# Patient Record
Sex: Female | Born: 1970 | Race: White | Hispanic: No | Marital: Married | Smoking: Never smoker
Health system: Southern US, Community
[De-identification: ages and names within clinical notes are randomized; demographics above are authoritative.]

## PROBLEM LIST (undated history)

## (undated) DIAGNOSIS — Z803 Family history of malignant neoplasm of breast: Secondary | ICD-10-CM

## (undated) DIAGNOSIS — I10 Essential (primary) hypertension: Secondary | ICD-10-CM

## (undated) DIAGNOSIS — Z8481 Family history of carrier of genetic disease: Secondary | ICD-10-CM

## (undated) DIAGNOSIS — S8290XA Unspecified fracture of unspecified lower leg, initial encounter for closed fracture: Secondary | ICD-10-CM

## (undated) DIAGNOSIS — E119 Type 2 diabetes mellitus without complications: Secondary | ICD-10-CM

## (undated) DIAGNOSIS — Z85828 Personal history of other malignant neoplasm of skin: Secondary | ICD-10-CM

## (undated) DIAGNOSIS — Z800 Family history of malignant neoplasm of digestive organs: Secondary | ICD-10-CM

## (undated) DIAGNOSIS — E669 Obesity, unspecified: Secondary | ICD-10-CM

## (undated) DIAGNOSIS — R000 Tachycardia, unspecified: Secondary | ICD-10-CM

## (undated) DIAGNOSIS — K219 Gastro-esophageal reflux disease without esophagitis: Secondary | ICD-10-CM

## (undated) DIAGNOSIS — C801 Malignant (primary) neoplasm, unspecified: Secondary | ICD-10-CM

## (undated) DIAGNOSIS — K5790 Diverticulosis of intestine, part unspecified, without perforation or abscess without bleeding: Secondary | ICD-10-CM

## (undated) DIAGNOSIS — R51 Headache: Secondary | ICD-10-CM

## (undated) DIAGNOSIS — R569 Unspecified convulsions: Secondary | ICD-10-CM

## (undated) DIAGNOSIS — D649 Anemia, unspecified: Secondary | ICD-10-CM

## (undated) DIAGNOSIS — Z808 Family history of malignant neoplasm of other organs or systems: Secondary | ICD-10-CM

## (undated) DIAGNOSIS — Z8742 Personal history of other diseases of the female genital tract: Secondary | ICD-10-CM

## (undated) DIAGNOSIS — D126 Benign neoplasm of colon, unspecified: Secondary | ICD-10-CM

## (undated) DIAGNOSIS — Z9889 Other specified postprocedural states: Secondary | ICD-10-CM

## (undated) DIAGNOSIS — E785 Hyperlipidemia, unspecified: Secondary | ICD-10-CM

## (undated) DIAGNOSIS — K648 Other hemorrhoids: Secondary | ICD-10-CM

## (undated) DIAGNOSIS — Z8632 Personal history of gestational diabetes: Secondary | ICD-10-CM

## (undated) HISTORY — DX: Other hemorrhoids: K64.8

## (undated) HISTORY — DX: Obesity, unspecified: E66.9

## (undated) HISTORY — DX: Gastro-esophageal reflux disease without esophagitis: K21.9

## (undated) HISTORY — PX: BASAL CELL CARCINOMA EXCISION: SHX1214

## (undated) HISTORY — DX: Personal history of gestational diabetes: Z86.32

## (undated) HISTORY — DX: Hyperlipidemia, unspecified: E78.5

## (undated) HISTORY — DX: Other specified postprocedural states: Z85.828

## (undated) HISTORY — DX: Family history of malignant neoplasm of breast: Z80.3

## (undated) HISTORY — DX: Family history of carrier of genetic disease: Z84.81

## (undated) HISTORY — DX: Family history of malignant neoplasm of digestive organs: Z80.0

## (undated) HISTORY — DX: Tachycardia, unspecified: R00.0

## (undated) HISTORY — DX: Personal history of other malignant neoplasm of skin: Z85.828

## (undated) HISTORY — DX: Diverticulosis of intestine, part unspecified, without perforation or abscess without bleeding: K57.90

## (undated) HISTORY — DX: Benign neoplasm of colon, unspecified: D12.6

## (undated) HISTORY — DX: Essential (primary) hypertension: I10

## (undated) HISTORY — DX: Headache: R51

## (undated) HISTORY — DX: Unspecified fracture of unspecified lower leg, initial encounter for closed fracture: S82.90XA

## (undated) HISTORY — DX: Family history of malignant neoplasm of other organs or systems: Z80.8

## (undated) HISTORY — DX: Anemia, unspecified: D64.9

## (undated) HISTORY — DX: Type 2 diabetes mellitus without complications: E11.9

## (undated) HISTORY — DX: Unspecified convulsions: R56.9

## (undated) HISTORY — DX: Other specified postprocedural states: Z98.890

## (undated) HISTORY — DX: Personal history of other diseases of the female genital tract: Z87.42

## (undated) HISTORY — PX: TONSILLECTOMY: SUR1361

---

## 1998-10-27 ENCOUNTER — Other Ambulatory Visit: Admission: RE | Admit: 1998-10-27 | Discharge: 1998-10-27 | Payer: Self-pay | Admitting: Obstetrics and Gynecology

## 1999-10-28 ENCOUNTER — Other Ambulatory Visit: Admission: RE | Admit: 1999-10-28 | Discharge: 1999-10-28 | Payer: Self-pay | Admitting: *Deleted

## 2000-11-23 ENCOUNTER — Other Ambulatory Visit: Admission: RE | Admit: 2000-11-23 | Discharge: 2000-11-23 | Payer: Self-pay | Admitting: *Deleted

## 2001-09-13 ENCOUNTER — Other Ambulatory Visit: Admission: RE | Admit: 2001-09-13 | Discharge: 2001-09-13 | Payer: Self-pay | Admitting: *Deleted

## 2002-04-09 ENCOUNTER — Other Ambulatory Visit: Admission: RE | Admit: 2002-04-09 | Discharge: 2002-04-09 | Payer: Self-pay | Admitting: *Deleted

## 2002-07-09 ENCOUNTER — Encounter: Admission: RE | Admit: 2002-07-09 | Discharge: 2002-10-07 | Payer: Self-pay | Admitting: *Deleted

## 2002-10-23 ENCOUNTER — Inpatient Hospital Stay (HOSPITAL_COMMUNITY): Admission: AD | Admit: 2002-10-23 | Discharge: 2002-10-25 | Payer: Self-pay | Admitting: *Deleted

## 2002-11-28 ENCOUNTER — Other Ambulatory Visit: Admission: RE | Admit: 2002-11-28 | Discharge: 2002-11-28 | Payer: Self-pay | Admitting: *Deleted

## 2004-11-17 ENCOUNTER — Ambulatory Visit: Payer: Self-pay | Admitting: Internal Medicine

## 2005-01-03 ENCOUNTER — Ambulatory Visit: Payer: Self-pay | Admitting: Internal Medicine

## 2005-01-10 ENCOUNTER — Other Ambulatory Visit: Admission: RE | Admit: 2005-01-10 | Discharge: 2005-01-10 | Payer: Self-pay | Admitting: Internal Medicine

## 2005-01-10 ENCOUNTER — Ambulatory Visit: Payer: Self-pay | Admitting: Internal Medicine

## 2005-03-11 ENCOUNTER — Encounter: Admission: RE | Admit: 2005-03-11 | Discharge: 2005-03-11 | Payer: Self-pay | Admitting: Internal Medicine

## 2005-10-19 ENCOUNTER — Ambulatory Visit: Payer: Self-pay | Admitting: Internal Medicine

## 2006-03-17 ENCOUNTER — Encounter: Admission: RE | Admit: 2006-03-17 | Discharge: 2006-03-17 | Payer: Self-pay | Admitting: Internal Medicine

## 2006-07-10 ENCOUNTER — Ambulatory Visit: Payer: Self-pay | Admitting: Internal Medicine

## 2006-09-29 ENCOUNTER — Ambulatory Visit: Payer: Self-pay | Admitting: Internal Medicine

## 2006-09-29 LAB — CONVERTED CEMR LAB
Albumin: 3.8 g/dL (ref 3.5–5.2)
Alkaline Phosphatase: 56 units/L (ref 39–117)
BUN: 11 mg/dL (ref 6–23)
Basophils Absolute: 0 10*3/uL (ref 0.0–0.1)
CO2: 29 meq/L (ref 19–32)
Calcium: 9.1 mg/dL (ref 8.4–10.5)
Chol/HDL Ratio, serum: 5.4
Creatinine, Ser: 0.6 mg/dL (ref 0.4–1.2)
Glomerular Filtration Rate, Af Am: 145 mL/min/{1.73_m2}
Glucose, Bld: 89 mg/dL (ref 70–99)
HDL: 38.6 mg/dL — ABNORMAL LOW (ref >39.0)
Hemoglobin: 13.5 g/dL (ref 12.0–15.0)
Lymphocytes Relative: 29.1 % (ref 12.0–46.0)
MCHC: 34.1 g/dL (ref 30.0–36.0)
Monocytes Relative: 6 % (ref 3.0–11.0)
Neutro Abs: 3.5 10*3/uL (ref 1.4–7.7)
Neutrophils Relative %: 62.9 % (ref 43.0–77.0)
Platelets: 247 10*3/uL (ref 150–400)
Potassium: 4.1 meq/L (ref 3.5–5.1)
TSH: 1.55 microintl units/mL (ref 0.35–5.50)
Total Bilirubin: 0.9 mg/dL (ref 0.3–1.2)
Total Protein: 6.6 g/dL (ref 6.0–8.3)
Triglyceride fasting, serum: 60 mg/dL (ref 0–149)

## 2006-10-06 ENCOUNTER — Ambulatory Visit: Payer: Self-pay | Admitting: Internal Medicine

## 2006-10-06 ENCOUNTER — Other Ambulatory Visit: Admission: RE | Admit: 2006-10-06 | Discharge: 2006-10-06 | Payer: Self-pay | Admitting: Internal Medicine

## 2006-10-06 ENCOUNTER — Encounter: Payer: Self-pay | Admitting: Internal Medicine

## 2006-10-27 ENCOUNTER — Ambulatory Visit: Payer: Self-pay | Admitting: Internal Medicine

## 2006-10-27 ENCOUNTER — Ambulatory Visit (HOSPITAL_COMMUNITY): Admission: RE | Admit: 2006-10-27 | Discharge: 2006-10-27 | Payer: Self-pay | Admitting: Internal Medicine

## 2006-11-16 ENCOUNTER — Ambulatory Visit: Payer: Self-pay | Admitting: Internal Medicine

## 2006-11-17 ENCOUNTER — Ambulatory Visit (HOSPITAL_COMMUNITY): Admission: RE | Admit: 2006-11-17 | Discharge: 2006-11-17 | Payer: Self-pay | Admitting: Internal Medicine

## 2007-03-19 ENCOUNTER — Encounter: Admission: RE | Admit: 2007-03-19 | Discharge: 2007-03-19 | Payer: Self-pay | Admitting: Internal Medicine

## 2007-10-02 ENCOUNTER — Ambulatory Visit: Payer: Self-pay | Admitting: Internal Medicine

## 2007-10-02 LAB — CONVERTED CEMR LAB
AST: 13 units/L (ref 0–37)
Albumin: 3.9 g/dL (ref 3.5–5.2)
Alkaline Phosphatase: 68 units/L (ref 39–117)
Basophils Absolute: 0 10*3/uL (ref 0.0–0.1)
Basophils Relative: 0.4 % (ref 0.0–1.0)
CO2: 29 meq/L (ref 19–32)
Chloride: 108 meq/L (ref 96–112)
Creatinine, Ser: 0.6 mg/dL (ref 0.4–1.2)
Direct LDL: 172.4 mg/dL
HCT: 38.6 % (ref 36.0–46.0)
Hemoglobin: 13.5 g/dL (ref 12.0–15.0)
MCHC: 34.9 g/dL (ref 30.0–36.0)
Monocytes Absolute: 0.4 10*3/uL (ref 0.2–0.7)
Neutrophils Relative %: 66.1 % (ref 43.0–77.0)
RBC: 4.56 M/uL (ref 3.87–5.11)
RDW: 12 % (ref 11.5–14.6)
Sodium: 142 meq/L (ref 135–145)
Total Bilirubin: 0.8 mg/dL (ref 0.3–1.2)
Total CHOL/HDL Ratio: 5.1
Total Protein: 6.6 g/dL (ref 6.0–8.3)
Triglycerides: 72 mg/dL (ref 0–149)
VLDL: 14 mg/dL (ref 0–40)
WBC: 6.9 10*3/uL (ref 4.5–10.5)

## 2007-10-04 LAB — CONVERTED CEMR LAB
Glucose, Urine, Semiquant: NEGATIVE
Nitrite: NEGATIVE
Urobilinogen, UA: 2
pH: 5

## 2007-10-09 ENCOUNTER — Other Ambulatory Visit: Admission: RE | Admit: 2007-10-09 | Discharge: 2007-10-09 | Payer: Self-pay | Admitting: Internal Medicine

## 2007-10-09 ENCOUNTER — Encounter: Payer: Self-pay | Admitting: Internal Medicine

## 2007-10-09 ENCOUNTER — Ambulatory Visit: Payer: Self-pay | Admitting: Internal Medicine

## 2007-10-09 DIAGNOSIS — E785 Hyperlipidemia, unspecified: Secondary | ICD-10-CM | POA: Insufficient documentation

## 2007-10-09 DIAGNOSIS — L732 Hidradenitis suppurativa: Secondary | ICD-10-CM | POA: Insufficient documentation

## 2007-10-09 DIAGNOSIS — K209 Esophagitis, unspecified without bleeding: Secondary | ICD-10-CM | POA: Insufficient documentation

## 2007-10-09 DIAGNOSIS — R319 Hematuria, unspecified: Secondary | ICD-10-CM | POA: Insufficient documentation

## 2007-10-09 LAB — HM PAP SMEAR

## 2007-10-17 ENCOUNTER — Encounter: Payer: Self-pay | Admitting: Internal Medicine

## 2007-11-20 ENCOUNTER — Ambulatory Visit: Payer: Self-pay | Admitting: Internal Medicine

## 2007-11-20 LAB — CONVERTED CEMR LAB
Blood in Urine, dipstick: NEGATIVE
Ketones, urine, test strip: NEGATIVE
Nitrite: NEGATIVE
Urobilinogen, UA: 0.2
WBC Urine, dipstick: NEGATIVE

## 2007-11-22 LAB — CONVERTED CEMR LAB
Bilirubin, Direct: 0.2 mg/dL (ref 0.0–0.3)
HDL: 45.9 mg/dL (ref >39.0)
LDL Cholesterol: 101 mg/dL — ABNORMAL HIGH (ref 0–99)
Triglycerides: 59 mg/dL (ref 0–149)
VLDL: 12 mg/dL (ref 0–40)

## 2007-11-27 ENCOUNTER — Ambulatory Visit: Payer: Self-pay | Admitting: Internal Medicine

## 2007-11-27 LAB — CONVERTED CEMR LAB
Cholesterol, target level: 200 mg/dL
HDL goal, serum: 40 mg/dL
LDL Goal: 160 mg/dL

## 2007-12-13 HISTORY — PX: BREAST BIOPSY: SHX20

## 2007-12-25 ENCOUNTER — Ambulatory Visit: Payer: Self-pay | Admitting: Licensed Clinical Social Worker

## 2008-01-03 ENCOUNTER — Ambulatory Visit: Payer: Self-pay | Admitting: Licensed Clinical Social Worker

## 2008-02-28 ENCOUNTER — Ambulatory Visit: Payer: Self-pay | Admitting: Licensed Clinical Social Worker

## 2008-03-21 ENCOUNTER — Encounter: Admission: RE | Admit: 2008-03-21 | Discharge: 2008-03-21 | Payer: Self-pay | Admitting: Internal Medicine

## 2008-04-02 ENCOUNTER — Encounter: Admission: RE | Admit: 2008-04-02 | Discharge: 2008-04-02 | Payer: Self-pay | Admitting: Internal Medicine

## 2008-04-02 ENCOUNTER — Encounter: Payer: Self-pay | Admitting: Internal Medicine

## 2008-05-27 ENCOUNTER — Ambulatory Visit: Payer: Self-pay | Admitting: Internal Medicine

## 2008-05-27 LAB — CONVERTED CEMR LAB
ALT: 16 units/L (ref 0–35)
Cholesterol: 165 mg/dL (ref 0–200)
LDL Cholesterol: 107 mg/dL — ABNORMAL HIGH (ref 0–99)

## 2008-06-03 ENCOUNTER — Ambulatory Visit: Payer: Self-pay | Admitting: Internal Medicine

## 2008-06-03 DIAGNOSIS — M25569 Pain in unspecified knee: Secondary | ICD-10-CM | POA: Insufficient documentation

## 2008-06-03 LAB — CONVERTED CEMR LAB: Blood Glucose, Fingerstick: 102

## 2008-10-16 ENCOUNTER — Ambulatory Visit: Payer: Self-pay | Admitting: Internal Medicine

## 2008-10-22 ENCOUNTER — Ambulatory Visit: Payer: Self-pay | Admitting: Internal Medicine

## 2008-10-31 ENCOUNTER — Telehealth: Payer: Self-pay | Admitting: Family Medicine

## 2008-11-11 ENCOUNTER — Ambulatory Visit: Payer: Self-pay | Admitting: Internal Medicine

## 2008-11-11 LAB — CONVERTED CEMR LAB
Albumin: 3.8 g/dL (ref 3.5–5.2)
Alkaline Phosphatase: 58 units/L (ref 39–117)
BUN: 15 mg/dL (ref 6–23)
Cholesterol: 180 mg/dL (ref 0–200)
Creatinine, Ser: 0.6 mg/dL (ref 0.4–1.2)
Eosinophils Absolute: 0.1 10*3/uL (ref 0.0–0.7)
Eosinophils Relative: 1.8 % (ref 0.0–5.0)
GFR calc Af Amer: 144 mL/min
GFR calc non Af Amer: 119 mL/min
HCT: 36.6 % (ref 36.0–46.0)
HDL: 50.2 mg/dL (ref >39.0)
MCV: 80.3 fL (ref 78.0–100.0)
Monocytes Absolute: 0.5 10*3/uL (ref 0.1–1.0)
Platelets: 220 10*3/uL (ref 150–400)
Potassium: 3.8 meq/L (ref 3.5–5.1)
TSH: 1.8 microintl units/mL (ref 0.35–5.50)
Triglycerides: 78 mg/dL (ref 0–149)
WBC: 7 10*3/uL (ref 4.5–10.5)

## 2008-11-18 ENCOUNTER — Ambulatory Visit: Payer: Self-pay | Admitting: Internal Medicine

## 2008-12-10 ENCOUNTER — Ambulatory Visit: Payer: Self-pay | Admitting: Internal Medicine

## 2009-04-11 LAB — CONVERTED CEMR LAB: Pap Smear: NORMAL

## 2009-06-02 ENCOUNTER — Ambulatory Visit: Payer: Self-pay | Admitting: Internal Medicine

## 2009-06-02 DIAGNOSIS — I10 Essential (primary) hypertension: Secondary | ICD-10-CM | POA: Insufficient documentation

## 2009-06-02 DIAGNOSIS — Z8742 Personal history of other diseases of the female genital tract: Secondary | ICD-10-CM

## 2009-06-02 HISTORY — DX: Personal history of other diseases of the female genital tract: Z87.42

## 2009-06-02 LAB — CONVERTED CEMR LAB: Blood Glucose, Fingerstick: 103

## 2009-07-28 ENCOUNTER — Ambulatory Visit: Payer: Self-pay | Admitting: Internal Medicine

## 2009-07-30 LAB — CONVERTED CEMR LAB
GFR calc non Af Amer: 118.05 mL/min (ref >60)
Potassium: 4.1 meq/L (ref 3.5–5.1)
Sodium: 140 meq/L (ref 135–145)

## 2009-09-17 ENCOUNTER — Ambulatory Visit: Payer: Self-pay | Admitting: Internal Medicine

## 2009-12-21 ENCOUNTER — Ambulatory Visit: Payer: Self-pay | Admitting: Internal Medicine

## 2009-12-21 LAB — CONVERTED CEMR LAB
AST: 16 units/L (ref 0–37)
BUN: 13 mg/dL (ref 6–23)
Basophils Absolute: 0.1 10*3/uL (ref 0.0–0.1)
Calcium: 9.1 mg/dL (ref 8.4–10.5)
Cholesterol: 185 mg/dL (ref 0–200)
Eosinophils Absolute: 0.2 10*3/uL (ref 0.0–0.7)
GFR calc non Af Amer: 117.81 mL/min (ref >60)
Glucose, Bld: 109 mg/dL — ABNORMAL HIGH (ref 70–99)
HDL: 51 mg/dL (ref >39.00)
Ketones, ur: NEGATIVE mg/dL
Leukocytes, UA: NEGATIVE
Lymphocytes Relative: 27.1 % (ref 12.0–46.0)
Lymphs Abs: 1.7 10*3/uL (ref 0.7–4.0)
MCHC: 31.7 g/dL (ref 30.0–36.0)
Monocytes Relative: 6.2 % (ref 3.0–12.0)
Nitrite: NEGATIVE
Platelets: 283 10*3/uL (ref 150.0–400.0)
RDW: 17.6 % — ABNORMAL HIGH (ref 11.5–14.6)
Specific Gravity, Urine: 1.025 (ref 1.000–1.030)
TSH: 1.67 microintl units/mL (ref 0.35–5.50)
Total Bilirubin: 0.7 mg/dL (ref 0.3–1.2)
Triglycerides: 65 mg/dL (ref 0.0–149.0)
VLDL: 13 mg/dL (ref 0.0–40.0)
pH: 5 (ref 5.0–8.0)

## 2010-01-12 ENCOUNTER — Ambulatory Visit: Payer: Self-pay | Admitting: Internal Medicine

## 2010-01-12 DIAGNOSIS — D649 Anemia, unspecified: Secondary | ICD-10-CM | POA: Insufficient documentation

## 2010-03-03 ENCOUNTER — Ambulatory Visit: Payer: Self-pay | Admitting: Internal Medicine

## 2010-03-09 ENCOUNTER — Telehealth: Payer: Self-pay | Admitting: Internal Medicine

## 2010-03-09 LAB — CONVERTED CEMR LAB
ALT: 16 units/L (ref 0–35)
AST: 15 units/L (ref 0–37)
Albumin: 3.7 g/dL (ref 3.5–5.2)
Alkaline Phosphatase: 53 units/L (ref 39–117)
Bilirubin, Direct: 0.1 mg/dL (ref 0.0–0.3)
Cholesterol: 135 mg/dL (ref 0–200)
LDL Cholesterol: 75 mg/dL (ref 0–99)
Total Protein: 6.7 g/dL (ref 6.0–8.3)
Triglycerides: 54 mg/dL (ref 0.0–149.0)

## 2010-04-02 ENCOUNTER — Encounter: Admission: RE | Admit: 2010-04-02 | Discharge: 2010-04-02 | Payer: Self-pay | Admitting: Internal Medicine

## 2010-04-08 ENCOUNTER — Encounter: Admission: RE | Admit: 2010-04-08 | Discharge: 2010-04-08 | Payer: Self-pay | Admitting: Internal Medicine

## 2010-04-08 ENCOUNTER — Encounter: Payer: Self-pay | Admitting: Internal Medicine

## 2010-04-08 LAB — HM MAMMOGRAPHY

## 2010-06-29 ENCOUNTER — Encounter (INDEPENDENT_AMBULATORY_CARE_PROVIDER_SITE_OTHER): Payer: Self-pay | Admitting: Obstetrics & Gynecology

## 2010-06-29 ENCOUNTER — Observation Stay (HOSPITAL_COMMUNITY): Admission: RE | Admit: 2010-06-29 | Discharge: 2010-06-30 | Payer: Self-pay | Admitting: Obstetrics & Gynecology

## 2010-06-29 HISTORY — PX: ABDOMINAL HYSTERECTOMY: SHX81

## 2010-07-04 ENCOUNTER — Inpatient Hospital Stay (HOSPITAL_COMMUNITY): Admission: AD | Admit: 2010-07-04 | Discharge: 2010-07-04 | Payer: Self-pay | Admitting: Obstetrics & Gynecology

## 2010-07-04 DIAGNOSIS — R5082 Postprocedural fever: Secondary | ICD-10-CM

## 2010-07-15 ENCOUNTER — Ambulatory Visit: Payer: Self-pay | Admitting: Internal Medicine

## 2010-07-15 DIAGNOSIS — E669 Obesity, unspecified: Secondary | ICD-10-CM | POA: Insufficient documentation

## 2010-07-15 DIAGNOSIS — R7309 Other abnormal glucose: Secondary | ICD-10-CM | POA: Insufficient documentation

## 2010-07-15 DIAGNOSIS — J309 Allergic rhinitis, unspecified: Secondary | ICD-10-CM | POA: Insufficient documentation

## 2010-09-01 ENCOUNTER — Ambulatory Visit: Payer: Self-pay | Admitting: Family Medicine

## 2010-09-01 DIAGNOSIS — R000 Tachycardia, unspecified: Secondary | ICD-10-CM

## 2010-09-01 DIAGNOSIS — J019 Acute sinusitis, unspecified: Secondary | ICD-10-CM | POA: Insufficient documentation

## 2010-09-01 HISTORY — DX: Tachycardia, unspecified: R00.0

## 2011-01-13 NOTE — Assessment & Plan Note (Signed)
Summary: fup bp//ccm   Vital Signs:  Patient profile:   40 year old female Menstrual status:  hysterectomy Height:      63.5 inches Weight:      212 pounds BMI:     37.10 Pulse rate:   88 / minute BP sitting:   110 / 70  (right arm) Cuff size:   large  Vitals Entered By: Romualdo Bolk, CMA (AAMA) (July 15, 2010 10:29 AM)  Serial Vital Signs/Assessments:  Time      Position  BP       Pulse  Resp  Temp     By                     122/80                         Madelin Headings MD  Comments: right arm large cuff  By: Madelin Headings MD   CC: Follow-up visit on bp, Hypertension Management  Does patient need assistance? Functional Status Shopping LMP - Character: heavy Menarche (age onset years): 10   Menses interval (days): 28 Menstrual flow (days): 5-7 Menstrual Status hysterectomy Last PAP Result normal   History of Present Illness: Nicole Gonzalez  comes in today  for follow up of a number o medical issues . She had her hysterctomy   on July 17th    surgery Da vinci method and went well. Bp was good in hospital and has been good since on med and out of work.     Is post op.    Bp has been good   some coughin at night  and about  taking sinus pill and afeels like drainage  in throat.  antibioitc helped .         Anemia :   9  range .  pre op  no recent check .  Hypertension History:      She complains of headache and syncope, but denies chest pain, palpitations, dyspnea with exertion, orthopnea, PND, peripheral edema, visual symptoms, neurologic problems, and side effects from treatment.  She notes no problems with any antihypertensive medication side effects.  Sinus Ha's at work, dizziness since surgery.        Positive major cardiovascular risk factors include hyperlipidemia and hypertension.  Negative major cardiovascular risk factors include female age less than 91 years old, no history of diabetes, and non-tobacco-user status.        Further assessment for target  organ damage reveals no history of ASHD, stroke/TIA, or peripheral vascular disease.     Preventive Screening-Counseling & Management  Alcohol-Tobacco     Alcohol drinks/day: 0     Smoking Status: never  Caffeine-Diet-Exercise     Caffeine use/day: <1     Does Patient Exercise: no  Current Medications (verified): 1)  Prilosec 20 Mg Cpdr (Omeprazole) .Marland Kitchen.. 1 Once Daily 2)  Lisinopril 10 Mg Tabs (Lisinopril) .Marland Kitchen.. 1 By Mouth Once Daily  For High Blood Pressure 3)  Simvastatin 40 Mg Tabs (Simvastatin) .Marland Kitchen.. 1 By Mouth Once Daily For High Cholesterol 4)  Ibuprofen 200 Mg Tabs (Ibuprofen) 5)  Stool Softner  Allergies (verified): 1)  ! Stadol (Butorphanol Tartrate)  Past History:  Past medical, surgical, family and social histories (including risk factors) reviewed, and no changes noted (except as noted below).  Past Medical History: Reviewed history from 01/12/2010 and no changes required. Hyperlipidemia  pretreatment  ldl 199 hx  gestational dm x2  childbirth x2         LAST Mammogram: 2009 Pap: doing with gyn Td: November 18, 2008 Colonscopy: n/a EKG: November 18, 2008   Consults: None   Past Surgical History: Tonsillectomy Hysterectomy-06/29/2010   ? had abnormal clls on path   fibroids  Past History:  Care Management: Dermatology: [facility] Gynecology: Dr Juliene Pina  Family History: Reviewed history from 12/10/2008 and no changes required. Family History Diabetes 1st degree relative Family History High cholesterol Family History of Melanoma    Father:   HT  , melanoma x 2  Mother:  well Siblings:   younger brother psoriatic arthritis  Older brother overweight.     Social History: Reviewed history from 01/12/2010 and no changes required. 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   no ETS      Review of Systems  The patient denies anorexia, fever, weight loss, weight gain, vision loss,  decreased hearing, prolonged cough, hemoptysis, abdominal pain, difficulty walking, abnormal bleeding, enlarged lymph nodes, and angioedema.    Physical Exam  General:  Well-developed,well-nourished,in no acute distress; alert,appropriate and cooperative throughout examination Head:  normocephalic and atraumatic.   Eyes:  vision grossly intact, pupils equal, and pupils round.   Neck:  No deformities, masses, or tenderness noted. Lungs:  Normal respiratory effort, chest expands symmetrically. Lungs are clear to auscultation, no crackles or wheezes. Heart:  Normal rate and regular rhythm. S1 and S2 normal without gallop, murmur, click, rub or other extra sounds. Pulses:  pulses intact without delay   Extremities:  no clubbing cyanosis or edema  Neurologic:  non focal  Skin:  turgor normal, color normal, no ecchymoses, and no petechiae.   Cervical Nodes:  No lymphadenopathy noted Psych:  Oriented X3, normally interactive, good eye contact, and not anxious appearing.     Impression & Recommendations:  Problem # 1:  HYPERTENSION (ICD-401.9) controlled    Her updated medication list for this problem includes:    Lisinopril 10 Mg Tabs (Lisinopril) .Marland Kitchen... 1 by mouth once daily  for high blood pressure  BP today: 110/70 Prior BP: 110/80 (01/12/2010)  Prior 10 Yr Risk Heart Disease: 1 % (07/28/2009)  Labs Reviewed: K+: 4.6 (12/21/2009) Creat: : 0.6 (12/21/2009)   Chol: 135 (03/03/2010)   HDL: 49.70 (03/03/2010)   LDL: 75 (03/03/2010)   TG: 54.0 (03/03/2010)  Problem # 2:  ANEMIA (ICD-285.9) post op better   to follow up with gyne  irona s needed Orders: Hgb (85018) Fingerstick (16109)  Problem # 3:  HYPERGLYCEMIA (ICD-790.29) up  167   after peanut butter today.    counseled about diet strategies and monitor   will check hg a1c at next check up.  Orders: Glucose, (CBG) (82962) Fingerstick (60454)  Problem # 4:  DIABETES MELLITUS, GESTATIONAL, HX OF (ICD-V13.29)  Orders: Glucose,  (CBG) (09811) Fingerstick (91478)  Problem # 5:  RHINITIS (ICD-477.9) with cough resolved now but can be recurrent .   and will do a trial of flonase if does  Her updated medication list for this problem includes:    Fluticasone Propionate 50 Mcg/act Susp (Fluticasone propionate) .Marland Kitchen... 2 sprays eaach nares q d  Problem # 6:  OBESITY (ICD-278.00) counseled  Ht: 63.5 (07/15/2010)   Wt: 212 (07/15/2010)   BMI: 37.10 (07/15/2010)  Complete Medication List: 1)  Prilosec 20 Mg Cpdr (Omeprazole) .Marland Kitchen.. 1 once daily 2)  Lisinopril  10 Mg Tabs (Lisinopril) .Marland Kitchen.. 1 by mouth once daily  for high blood pressure 3)  Simvastatin 40 Mg Tabs (Simvastatin) .Marland Kitchen.. 1 by mouth once daily for high cholesterol 4)  Ibuprofen 200 Mg Tabs (Ibuprofen) 5)  Stool Softner  6)  Fluticasone Propionate 50 Mcg/act Susp (Fluticasone propionate) .... 2 sprays eaach nares q d  Hypertension Assessment/Plan:      The patient's hypertensive risk group is category B: At least one risk factor (excluding diabetes) with no target organ damage.  Her calculated 10 year risk of coronary heart disease is 1 %.  Today's blood pressure is 110/70.  Her blood pressure goal is < 140/90.  Patient Instructions: 1)  try claritin or zyrtec   for drainage. and then  2)   begin on nasal steroid spray. if  respiratory signs returning.  to see if this helps control the  sinus signs .  3)  Your Hg was 11.6   4)  Your blood sugar is in the prediabetic range  .  5)  will follow . 6)  YOur Blood pressure i s controlled and  continue med.  7)  CPX with labs and hg a1c   for next February  2012 or as needed.  Prescriptions: FLUTICASONE PROPIONATE 50 MCG/ACT SUSP (FLUTICASONE PROPIONATE) 2 sprays eaach nares q d  #1 x 3   Entered and Authorized by:   Madelin Headings MD   Signed by:   Madelin Headings MD on 07/15/2010   Method used:   Print then Give to Patient   RxID:   [id number]   Laboratory Results   Blood Tests     Glucose (random): 169  mg/dL   (Normal Range: 14-782)   CBC   HGB:  11.6 g/dL   (Normal Range: 95.6-21.3 in Males, 12.0-15.0 in Females) Comments: Rita Ohara  July 15, 2010 11:13 AM

## 2011-01-13 NOTE — Assessment & Plan Note (Signed)
Summary: cpx/per Shannon/cjr   Vital Signs:  Patient profile:   40 year old female Menstrual status:  regular LMP:     01/12/2010 Height:      63.5 inches Weight:      212 pounds Pulse rate:   66 / minute BP sitting:   110 / 80  (left arm) Cuff size:   large  Vitals Entered By: Romualdo Bolk, CMA (AAMA) (January 12, 2010 11:31 AM)  Serial Vital Signs/Assessments:  Time      Position  BP       Pulse  Resp  Temp     By                     120/70                         Madelin Headings MD  Comments: large cuff sitting  Pt brought in her bp wrist cuff but it kept coming up with error. Romualdo Bolk, CMA (AAMA)  January 12, 2010 11:39 AM  By: Madelin Headings MD   CC: CPX without pap- Pt has a gyn who does paps LMP (date): 01/12/2010 LMP - Character: heavy Menarche (age onset years): 10   Menses interval (days): 28 Menstrual flow (days): 5-7 Enter LMP: 01/12/2010 Last PAP Result normal   History of Present Illness: Nicole Gonzalez  comesin today for   for preventive visit . Since last visit  here  there have been no major changes in health status  .  She  has begun to reemphasize some exercise. feeling ok. SOme  activity.    HT: no se of meds  wrist cuff reading high but doesnt think its correct  LIPIDs:  no se  of meds  GI. when ran out of  prilosec  tried .  prevacid no help.  and had more symptom  Periods : very heavy . followed by gyne. considering hormonal therapy.   Preventive Care Screening  Pap Smear:    Date:  04/11/2009    Results:  normal   Prior Values:    Last Tetanus Booster:  Tdap (11/18/2008)   Preventive Screening-Counseling & Management  Alcohol-Tobacco     Alcohol drinks/day: 0     Smoking Status: never  Caffeine-Diet-Exercise     Caffeine use/day: <1     Does Patient Exercise: no  Hep-HIV-STD-Contraception     Dental Visit-last 6 months yes     Sun Exposure-Excessive: yes  Safety-Violence-Falls     Seat Belt Use: yes  Firearms in the Home: firearms in the home     Firearm Counseling: not indicated; uses recommended firearm safety measures     Smoke Detectors: yes      Blood Transfusions:  no.        Travel History:  no risk travell.    Current Medications (verified): 1)  Simvastatin 20 Mg  Tabs (Simvastatin) .Marland Kitchen.. 1 By Mouth Once Daily 2)  Prilosec 20 Mg Cpdr (Omeprazole) .Marland Kitchen.. 1 Once Daily 3)  Lisinopril 10 Mg Tabs (Lisinopril) .Marland Kitchen.. 1 By Mouth Once Daily  For High Blood Pressure  Allergies (verified): No Known Drug Allergies  Past History:  Past medical, surgical, family and social histories (including risk factors) reviewed, and no changes noted (except as noted below).  Past Medical History: Hyperlipidemia  pretreatment  ldl 199 hx gestational dm x2  childbirth x2  LAST Mammogram: 2009 Pap: doing with gyn Td: November 18, 2008 Colonscopy: n/a EKG: November 18, 2008   Consults: None   Past Surgical History: Reviewed history from 11/18/2008 and no changes required. Tonsillectomy  Past History:  Care Management: Dermatology: [facility] Gynecology: Dr Earlene Plater  Family History: Reviewed history from 12/10/2008 and no changes required. Family History Diabetes 1st degree relative Family History High cholesterol Family History of Melanoma    Father:   HT  , melanoma x 2  Mother:  well Siblings:   younger brother psoriatic arthritis  Older brother overweight.     Social History: Reviewed history from 12/10/2008 and no changes required. 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   no ETS    Dental Care w/in 6 mos.:  yes Sun Exposure-Excessive:  yes Seat Belt Use:  yes Blood Transfusions:  no  Review of Systems  The patient denies anorexia, fever, weight loss, weight gain, vision loss, decreased hearing, hoarseness, chest pain, syncope, dyspnea on exertion, peripheral edema, headaches, hemoptysis,  abdominal pain, melena, hematochezia, severe indigestion/heartburn, hematuria, muscle weakness, difficulty walking, unusual weight change, abnormal bleeding, enlarged lymph nodes, angioedema, and breast masses.         cough   at night from  resp infection.   Physical Exam General Appearance: well developed, well nourished, no acute distress Eyes: conjunctiva and lids normal, PERRLA, EOMI, WNL Ears, Nose, Mouth, Throat: TM clear, nares clear, oral exam WNL Neck: supple, no lymphadenopathy, no thyromegaly, no JVD Respiratory: clear to auscultation and percussion, respiratory effort normal Cardiovascular: regular rate and rhythm, S1-S2, no murmur, rub or gallop, no bruits, peripheral pulses normal and symmetric, no cyanosis, clubbing, edema   fewvaricosities Chest: no scars, masses, tenderness; no asymmetry, skin changes, nipple discharge   Gastrointestinal: soft, non-tender; no hepatosplenomegaly, masses; active bowel sounds all quadrants,  Genitourinary: per gyne Lymphatic: no cervical, axillary or inguinal adenopathy Musculoskeletal: gait normal, muscle tone and strength WNL, no joint swelling, effusions, discoloration, crepitus  Skin: clear, good turgor, color WNL, no rashes, lesions, or ulcerations Neurologic: normal mental status, normal reflexes, normal strength, sensation, and motion Psychiatric: alert; oriented to person, place and time Other Exam: labs reviewed    Impression & Recommendations:  Problem # 1:  PREVENTIVE HEALTH CARE (ICD-V70.0) Discussed nutrition,exercise,diet,healthy weight, vitamin D and calcium.   counseled   disc hep a and b vaccine   will give twin rix today  Problem # 2:  HYPERTENSION (ICD-401.9) machine is clearly not accurate   ho for a new one Her updated medication list for this problem includes:    Lisinopril 10 Mg Tabs (Lisinopril) .Marland Kitchen... 1 by mouth once daily  for high blood pressure  Problem # 3:  HYPERLIPIDEMIA (ICD-272.2)  no se of med but try  inc dose to get to better   LDL  otherwise will go back down to 20. continue lifestyle intervention  The following medications were removed from the medication list:    Simvastatin 20 Mg Tabs (Simvastatin) .Marland Kitchen... 1 by mouth once daily Her updated medication list for this problem includes:    Simvastatin 40 Mg Tabs (Simvastatin) .Marland Kitchen... 1 by mouth once daily for high cholesterol  Labs Reviewed: SGOT: 16 (12/21/2009)   SGPT: 21 (12/21/2009)  Lipid Goals: Chol Goal: 200 (11/27/2007)   HDL Goal: 40 (11/27/2007)   LDL Goal: 160 (11/27/2007)   TG Goal: 150 (11/27/2007)  Prior 10 Yr Risk  Heart Disease: 1 % (07/28/2009)   HDL:51.00 (12/21/2009), 50.2 (11/11/2008)  LDL:121 (12/21/2009), 114 (11/11/2008)  Chol:185 (12/21/2009), 180 (11/11/2008)  Trig:65.0 (12/21/2009), 78 (11/11/2008)  Problem # 4:  DIABETES MELLITUS, GESTATIONAL, HX OF (ICD-V13.29) with fasting hyperglycemia    lifestyle intervention and   above  Problem # 5:  ANEMIA (ICD-285.9) Assessment: New  appears like iron defic from heavy periods ,  reviewed   Hgb: 10.5 (12/21/2009)   Hct: 33.2 (12/21/2009)   Platelets: 283.0 (12/21/2009) RBC: 4.47 (12/21/2009)   RDW: 17.6 H % (12/21/2009)   WBC: 6.1 (12/21/2009) MCV: 74.2 (12/21/2009)   MCHC: 31.7 (12/21/2009) TSH: 1.67 (12/21/2009)  Problem # 6:  GERD (ICD-530.1) symptom when on prevacid and prilosec works better.   Complete Medication List: 1)  Prilosec 20 Mg Cpdr (Omeprazole) .Marland Kitchen.. 1 once daily 2)  Lisinopril 10 Mg Tabs (Lisinopril) .Marland Kitchen.. 1 by mouth once daily  for high blood pressure 3)  Simvastatin 40 Mg Tabs (Simvastatin) .Marland Kitchen.. 1 by mouth once daily for high cholesterol  Other Orders: TwinRix 1ml ( Hep A&B Adult dose) (16109) Admin 1st Vaccine (60454)  Patient Instructions: 1)  change simvastatin to 40 mg per day to try to get ldl to 100 or below 2)  repeat lipids  lfts  hg a1c  in 2 months  ..then plan follow up  3)  take iron OTC  1-2 per day and rov here or at gyne with     Hg in 2-3 months  4)  continue  implementing lifestyle intervention . 5)  exercise increasing steps  eventually 09811 per day etc.  Prescriptions: SIMVASTATIN 40 MG TABS (SIMVASTATIN) 1 by mouth once daily for high cholesterol  #90 x 1   Entered and Authorized by:   Madelin Headings MD   Signed by:   Madelin Headings MD on 01/12/2010   Method used:   Electronically to        [facility]* (mail-order)             ,          Ph: [phone]       Fax: [phone]   RxID:   [id number] PRILOSEC 20 MG CPDR (OMEPRAZOLE) 1 once daily  #30 x 6   Entered and Authorized by:   Madelin Headings MD   Signed by:   Madelin Headings MD on 01/12/2010   Method used:   Electronically to        [facility]  [place] [address] (retail)       [address]       [place], [place]  [address]       Ph: [phone] or [phone]       Fax: [phone]   RxID:   [id number] LISINOPRIL 10 MG TABS (LISINOPRIL) 1 by mouth once daily  for high blood pressure  #30 Tablet x 12   Entered and Authorized by:   Madelin Headings MD   Signed by:   Madelin Headings MD on 01/12/2010   Method used:   Electronically to        [facility]  [place] [address] (retail)       [address]       [place], [place]  [address]       Ph: [phone] or [phone]       Fax: [phone]   RxID:   [id number]    Immunizations Administered:  TwinRix # 1:  Vaccine Type: TwinRix    Site: left deltoid    Mfr: GlaxoSmithKline    Dose: 1.0 ml    Route: IM    Given by: Romualdo Bolk, CMA (AAMA)    Exp. Date: 11/11/2010    Lot #: [id number]

## 2011-01-13 NOTE — Progress Notes (Signed)
Summary: FYI- Pt going to be put on Lupron and have a hyst.  Phone Note Call from Patient Call back at [phone]   Caller: Patient Summary of Call: Pt is going to having hysterectomy. Pt wanted to let us know that they are going put her lupron on to shrink the tumors. They measured her uterus at 18 weeks.  Initial call taken by: Romualdo Bolk, CMA (AAMA),  March 09, 2010 10:06 AM

## 2011-01-13 NOTE — Assessment & Plan Note (Signed)
Summary: ?sinus inf and ear inf/cjr   Vital Signs:  Patient profile:   40 year old female Menstrual status:  hysterectomy Height:      63.5 inches (161.29 cm) Weight:      215 pounds (97.73 kg) O2 Sat:      98 % on Room air Temp:     97.9 degrees F (36.61 degrees C) oral Pulse rate:   116 / minute BP sitting:   120 / 88  (left arm) Cuff size:   large  Vitals Entered By: Josph Macho RMA (September 01, 2010 8:40 AM)  O2 Flow:  Room air  Serial Vital Signs/Assessments:  Time      Position  BP       Pulse  Resp  Temp     By                              96                    Danise Edge MD  CC: Sinus and ear infection, headache  X3 days/ CF Is Patient Diabetic? No   History of Present Illness: Patient in today for evaluation of sinus pressure and pain. She has been struggling with symptoms in earnest about 3 days. Has facial pressure/nasal congestion/scan pinkish nasal discharge/dry cough/sore throat/myalgias and low grade fevers and chills. She notes some mild nausea and one loose stool just starting this am. No CP/palp/SOB/Wheeze/vomitting/abdominal pain or anorexia. Has been taking some OTC preparations containing Sudafed to help with the pressure  Current Medications (verified): 1)  Prilosec 20 Mg Cpdr (Omeprazole) .Marland Kitchen.. 1 Once Daily 2)  Lisinopril 10 Mg Tabs (Lisinopril) .Marland Kitchen.. 1 By Mouth Once Daily  For High Blood Pressure 3)  Simvastatin 40 Mg Tabs (Simvastatin) .Marland Kitchen.. 1 By Mouth Once Daily For High Cholesterol 4)  Fluticasone Propionate 50 Mcg/act Susp (Fluticasone Propionate) .... 2 Sprays [place] Nares Q D  Allergies (verified): 1)  ! Stadol  Past History:  Past medical history reviewed for relevance to current acute and chronic problems. Social history (including risk factors) reviewed for relevance to current acute and chronic problems.  Past Medical History: Reviewed history from 01/12/2010 and no changes required. Hyperlipidemia  pretreatment  ldl 199 hx  gestational dm x2  childbirth x2         LAST Mammogram: 2009 Pap: doing with gyn Td: November 18, 2008 Colonscopy: n/a EKG: November 18, 2008   Consults: None   Social History: Reviewed history from 01/12/2010 and no changes required. 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   no ETS      Review of Systems      See HPI  Physical Exam  General:  Well-developed,well-nourished,in no acute distress; alert,appropriate and cooperative throughout examination Head:  Normocephalic and atraumatic without obvious abnormalities. No apparent alopecia or balding. Eyes:  No corneal or conjunctival inflammation noted. EOMI Ears:  External ear exam shows no significant lesions or deformities.  Otoscopic examination reveals clear canals, tympanic membranes are intact bilaterally without bulging, retraction, inflammation or discharge. Hearing is grossly normal bilaterally. Nose:  External nasal examination shows no deformity or inflammation. Nasal mucosa are erythematous and moist without lesions. Mouth:  Oral mucosa and oropharynx without lesions or exudates.  Teeth in good repair. Neck:  No deformities, masses, or tenderness noted. Lungs:  Normal respiratory effort, chest expands symmetrically. Lungs are clear to auscultation, no crackles or wheezes. Heart:  Normal rate and regular rhythm. S1 and S2 normal without gallop, murmur, click, rub or other extra sounds. Abdomen:  Bowel sounds positive,abdomen soft and non-tender without masses, organomegaly or hernias noted. Extremities:  No clubbing, cyanosis, edema, or deformity noted  Cervical Nodes:  No lymphadenopathy noted Psych:  Cognition and judgment appear intact. Alert and cooperative with normal attention span and concentration. No apparent delusions, illusions, hallucinations   Impression & Recommendations:  Problem # 1:  SINUSITIS, ACUTE (ICD-461.9)  Her  updated medication list for this problem includes:    Fluticasone Propionate 50 Mcg/act Susp (Fluticasone propionate) .Marland Kitchen... 2 sprays [place] nares q d    Augmentin 875-125 Mg Tabs (Amoxicillin-pot clavulanate) .Marland Kitchen... 1 tab by mouth two times a day x 10 days    Mucinex 600 Mg Xr12h-tab (Guaifenesin) .Marland Kitchen... 1 tab by mouth two times a day x 10day Use nasal saline and antihistamines as needed but avoid Sudafed  Problem # 2:  UNSPECIFIED TACHYCARDIA (ICD-785.0) Discontinue sudafed use and maintain adequate hydration  Problem # 3:  HYPERTENSION (ICD-401.9)  Her updated medication list for this problem includes:    Lisinopril 10 Mg Tabs (Lisinopril) .Marland Kitchen... 1 by mouth once daily  for high blood pressure Well controlled at today's visit no changes  Complete Medication List: 1)  Prilosec 20 Mg Cpdr (Omeprazole) .Marland Kitchen.. 1 once daily 2)  Lisinopril 10 Mg Tabs (Lisinopril) .Marland Kitchen.. 1 by mouth once daily  for high blood pressure 3)  Simvastatin 40 Mg Tabs (Simvastatin) .Marland Kitchen.. 1 by mouth once daily for high cholesterol 4)  Fluticasone Propionate 50 Mcg/act Susp (Fluticasone propionate) .... 2 sprays [place] nares q d 5)  Augmentin 875-125 Mg Tabs (Amoxicillin-pot clavulanate) .Marland Kitchen.. 1 tab by mouth two times a day x 10 days 6)  Mucinex 600 Mg Xr12h-tab (Guaifenesin) .Marland Kitchen.. 1 tab by mouth two times a day x 10day  Patient Instructions: 1)  Please schedule a follow-up appointment as needed if symptoms worsen or do not improve. 2)  Get plenty of rest, drink lots of clear liquids, and use Tylenol or Ibuprofen for fever and comfort. Return in 7-10 days if you're not better: sooner if you'er feeling worse.  3)  Take 650 - 1000 mg of tylenol every 6 hours as needed for relief of pain or comfort of fever. Avoid taking more than 3000 mg in a 24 hour period( can cause liver damage in higher doses).  4)  Take 400-600 mg of Ibuprofen (Advil, Motrin) with food every 6 5)  Take your antibiotic as prescribed until ALL of it is gone, but  stop if you develop a rash or swelling and contact our office as soon as possible.  6)  Consider a probiotic such as Align caps daily or a yogurt such as Activia yogurt daily while on Antibiotics 7)  Consider nasal saline flushes daily as well as the Fluticasone nasal daily for the next few weeks 8)  May use Benadryl at bedtime for congestion and difficulty sleeping Prescriptions: MUCINEX 600 MG XR12H-TAB (GUAIFENESIN) 1 tab by mouth two times a day x 10day  #20 x 0   Entered and Authorized by:   Danise Edge MD   Signed by:   Danise Edge MD on 09/01/2010   Method used:   Historical   RxID:   [id number] AUGMENTIN 875-125 MG TABS (AMOXICILLIN-POT CLAVULANATE) 1 tab by mouth two times a day x 10 days  #  20 x 0   Entered and Authorized by:   Danise Edge MD   Signed by:   Danise Edge MD on 09/01/2010   Method used:   Electronically to        [facility]  [place] [address] (retail)       [address]       [place], [place]  [address]       Ph: [phone] or [phone]       Fax: [phone]   RxID:   [id number]

## 2011-01-17 ENCOUNTER — Encounter: Payer: Self-pay | Admitting: Internal Medicine

## 2011-01-28 ENCOUNTER — Other Ambulatory Visit: Payer: Self-pay | Admitting: Internal Medicine

## 2011-02-01 ENCOUNTER — Other Ambulatory Visit: Payer: BC Managed Care – PPO | Admitting: Internal Medicine

## 2011-02-01 DIAGNOSIS — Z0000 Encounter for general adult medical examination without abnormal findings: Secondary | ICD-10-CM

## 2011-02-01 LAB — HEPATIC FUNCTION PANEL
AST: 17 U/L (ref 0–37)
Albumin: 3.9 g/dL (ref 3.5–5.2)
Alkaline Phosphatase: 71 U/L (ref 39–117)
Bilirubin, Direct: 0.1 mg/dL (ref 0.0–0.3)
Total Protein: 7 g/dL (ref 6.0–8.3)

## 2011-02-01 LAB — CBC WITH DIFFERENTIAL/PLATELET
Basophils Relative: 0.5 % (ref 0.0–3.0)
Eosinophils Absolute: 0.1 10*3/uL (ref 0.0–0.7)
Hemoglobin: 12.3 g/dL (ref 12.0–15.0)
Lymphocytes Relative: 30 % (ref 12.0–46.0)
MCHC: 33.6 g/dL (ref 30.0–36.0)
Monocytes Relative: 6.5 % (ref 3.0–12.0)
Neutro Abs: 3.9 10*3/uL (ref 1.4–7.7)
Neutrophils Relative %: 60.9 % (ref 43.0–77.0)
RBC: 4.54 Mil/uL (ref 3.87–5.11)
WBC: 6.4 10*3/uL (ref 4.5–10.5)

## 2011-02-01 LAB — BASIC METABOLIC PANEL
Calcium: 9.1 mg/dL (ref 8.4–10.5)
Creatinine, Ser: 0.7 mg/dL (ref 0.4–1.2)
GFR: 91.96 mL/min (ref >60.00)
Sodium: 139 mEq/L (ref 135–145)

## 2011-02-01 LAB — LIPID PANEL
HDL: 49.2 mg/dL (ref >39.00)
Total CHOL/HDL Ratio: 3
VLDL: 12.4 mg/dL (ref 0.0–40.0)

## 2011-02-01 LAB — POCT URINALYSIS DIPSTICK
Ketones, UA: NEGATIVE
Nitrite, UA: NEGATIVE
Protein, UA: NEGATIVE
pH, UA: 5.5

## 2011-02-01 LAB — TSH: TSH: 1.61 u[IU]/mL (ref 0.35–5.50)

## 2011-02-08 ENCOUNTER — Encounter: Payer: Self-pay | Admitting: Internal Medicine

## 2011-02-08 ENCOUNTER — Telehealth: Payer: Self-pay | Admitting: Internal Medicine

## 2011-02-08 NOTE — Telephone Encounter (Signed)
Pt needs to come in for cpx asap..... Pt was on the bmp list and wasn't rescheduled.... Pt is off of work on  3/6  And  3/26.... Would like to be worked into schedule on either of these days if possible due to bp issues / concerns.... Pts # [phone].... Can you advise?

## 2011-02-09 NOTE — Telephone Encounter (Signed)
She was on the schedule this week and missed an appt.   Something wrong with the scheduling  Module(please check and advise patient    Make sure she doesn't get no show charge  if didn't  Know about apt) Ok to add on to March 26th schedule .

## 2011-02-10 ENCOUNTER — Telehealth: Payer: Self-pay | Admitting: Internal Medicine

## 2011-02-10 NOTE — Telephone Encounter (Signed)
Doesn't matter  Except not after 330 Need 30 minutes

## 2011-02-10 NOTE — Telephone Encounter (Signed)
Can you advise when what time of day pt can be worked into schedule on 3/26?

## 2011-02-11 NOTE — Telephone Encounter (Signed)
Pt called back to find out when she could come in for cpx. Pt has been sch for cpx on 03/07/11 at 1:45pm, as noted.

## 2011-02-26 LAB — CBC
HCT: 29.9 % — ABNORMAL LOW (ref 36.0–46.0)
HCT: 27.6 % — ABNORMAL LOW (ref 36.0–46.0)
Hemoglobin: 9 g/dL — ABNORMAL LOW (ref 12.0–15.0)
MCH: 24 pg — ABNORMAL LOW (ref 26.0–34.0)
MCV: 72.2 fL — ABNORMAL LOW (ref 78.0–100.0)
MCV: 72.1 fL — ABNORMAL LOW (ref 78.0–100.0)
RBC: 4.15 MIL/uL (ref 3.87–5.11)
RBC: 3.83 MIL/uL — ABNORMAL LOW (ref 3.87–5.11)
WBC: 9 10*3/uL (ref 4.0–10.5)
WBC: 8.3 10*3/uL (ref 4.0–10.5)

## 2011-02-26 LAB — DIFFERENTIAL
Eosinophils Absolute: 0.1 10*3/uL (ref 0.0–0.7)
Eosinophils Relative: 1 % (ref 0–5)
Lymphocytes Relative: 12 % (ref 12–46)
Lymphs Abs: 1.1 10*3/uL (ref 0.7–4.0)
Monocytes Absolute: 0.7 10*3/uL (ref 0.1–1.0)

## 2011-02-26 LAB — ABO/RH: ABO/RH(D): O POS

## 2011-02-26 LAB — URINALYSIS, ROUTINE W REFLEX MICROSCOPIC
Bilirubin Urine: NEGATIVE
Hgb urine dipstick: NEGATIVE
Ketones, ur: NEGATIVE mg/dL
Nitrite: NEGATIVE
Protein, ur: NEGATIVE mg/dL
Urobilinogen, UA: 1 mg/dL (ref 0.0–1.0)

## 2011-02-27 LAB — CBC
HCT: 32.2 % — ABNORMAL LOW (ref 36.0–46.0)
Hemoglobin: 10.7 g/dL — ABNORMAL LOW (ref 12.0–15.0)
RBC: 4.59 MIL/uL (ref 3.87–5.11)
RDW: 20.3 % — ABNORMAL HIGH (ref 11.5–15.5)
WBC: 6.5 10*3/uL (ref 4.0–10.5)

## 2011-02-27 LAB — COMPREHENSIVE METABOLIC PANEL
ALT: 21 U/L (ref 0–35)
Alkaline Phosphatase: 68 U/L (ref 39–117)
CO2: 30 mEq/L (ref 19–32)
Chloride: 107 mEq/L (ref 96–112)
Glucose, Bld: 124 mg/dL — ABNORMAL HIGH (ref 70–99)
Potassium: 4.1 mEq/L (ref 3.5–5.1)
Sodium: 141 mEq/L (ref 135–145)
Total Bilirubin: 0.7 mg/dL (ref 0.3–1.2)
Total Protein: 7.2 g/dL (ref 6.0–8.3)

## 2011-02-27 LAB — SURGICAL PCR SCREEN: MRSA, PCR: NEGATIVE

## 2011-02-27 LAB — PREGNANCY, URINE: Preg Test, Ur: NEGATIVE

## 2011-03-02 ENCOUNTER — Other Ambulatory Visit: Payer: Self-pay | Admitting: Internal Medicine

## 2011-03-02 DIAGNOSIS — Z1231 Encounter for screening mammogram for malignant neoplasm of breast: Secondary | ICD-10-CM

## 2011-03-07 ENCOUNTER — Ambulatory Visit (INDEPENDENT_AMBULATORY_CARE_PROVIDER_SITE_OTHER): Payer: BC Managed Care – PPO | Admitting: Internal Medicine

## 2011-03-07 ENCOUNTER — Encounter: Payer: Self-pay | Admitting: Internal Medicine

## 2011-03-07 VITALS — BP 120/80 | HR 78 | Ht 63.75 in | Wt 214.0 lb

## 2011-03-07 DIAGNOSIS — R7309 Other abnormal glucose: Secondary | ICD-10-CM

## 2011-03-07 DIAGNOSIS — Z0000 Encounter for general adult medical examination without abnormal findings: Secondary | ICD-10-CM

## 2011-03-07 DIAGNOSIS — I10 Essential (primary) hypertension: Secondary | ICD-10-CM

## 2011-03-07 DIAGNOSIS — K209 Esophagitis, unspecified without bleeding: Secondary | ICD-10-CM

## 2011-03-07 DIAGNOSIS — E669 Obesity, unspecified: Secondary | ICD-10-CM

## 2011-03-07 DIAGNOSIS — E782 Mixed hyperlipidemia: Secondary | ICD-10-CM

## 2011-03-07 DIAGNOSIS — D649 Anemia, unspecified: Secondary | ICD-10-CM

## 2011-03-07 DIAGNOSIS — E785 Hyperlipidemia, unspecified: Secondary | ICD-10-CM

## 2011-03-07 NOTE — Patient Instructions (Signed)
Continue meds   Continue lifestyle intervention healthy eating and exercise .  To help lipids and weight. Call prn otherwise check up in a year

## 2011-03-07 NOTE — Progress Notes (Signed)
  Subjective:    Patient ID: Nicole Gonzalez, female    DOB: 1971-05-03, 40 y.o.   MRN: [id number]  HPI   patient comes in for a preventive health care visit and medication management. Since her last checkup she has had her hysterectomy in July 2011 and having much less bleeding and doing better. She also accidentally fractured her right leg trying to get help when her husband had a kidney stone. She was in a walking boot last fall. Since that time she's tried to re\re implementing healthy lifestyle    Bp good    Readings. No side effects of medication  GERD taking Prilosec most days.  Hyperlipidemia: taking simvastatin without known side effect  Allergic rhinitis staying on her Flonase Review of Systems  she had a problem with her sinuses with a sinus infection in December and was treated with an antibiotic at an urgent care center at that time she's had persistent residual what she calls a pressure in the head and bitemporal. Minimal congestion no sneezing no fever no face pain. Unsure she can take a decongestant but it did help when she took it one time. Vise pressure more to side.   Some time   Feels congested but not discharge or cough.  Has had twinges of pain in the right groin going down to the upper thigh area her GYN told her to get more fiber in case it was a bowel symptom no interference with walking fever injury   Currently not a problem    Objective:   Physical Exam Physical Exam: Vital signs reviewed FAO:ZHYQ is a well-developed well-nourished alert cooperative  white female who appears her stated age in no acute distress.  HEENT: normocephalic  traumatic , Eyes: PERRL EOM's full, conjunctiva clear, Nares: paten,t no deformity discharge or tenderness., Ears: no deformity EAC's clear TMs with normal landmarks. Mouth: clear OP, no lesions, edema.  Moist mucous membranes. Dentition in adequate repair. NECK: supple without masses, thyromegaly or bruits. CHEST/PULM:  Clear to  auscultation and percussion breath sounds equal no wheeze , rales or rhonchi. No chest wall deformities or tenderness. CV: PMI is nondisplaced, S1 S2 no gallops, murmurs, rubs. Peripheral pulses are full without delay.No JVD .  Repeat blood pressure 118/70. ABDOMEN: Bowel sounds normal nontender  No guard or rebound, no hepato splenomegal no CVA tenderness.  No hernia. Well healed scar lower abdomen  Extremtities:  No clubbing cyanosis or edema, no acute joint swelling or redness no focal atrophy NEURO:  Oriented x3, cranial nerves 3-12 appear to be intact, no obvious focal weakness,gait within normal limits no abnormal reflexes or asymmetrical SKIN: No acute rashes normal turgor, color, no bruising or petechiae. PSYCH: Oriented, good eye contact, no obvious depression anxiety, cognition and judgment appear normal. Labs reviewed      Assessment & Plan:  Preventive Health Care HT LIPIDS Hyperglycemia mild  Obesity   GERD: stable but on meds Anemia better after hysterectomy    no change in medications reviewed each of them counseled on strategies of lifestyle intervention   Okay to try Zyrtec-D for the pressure meds possibly sinus allergic follow up if persistent and progressive. Monitor blood pressure readings.

## 2011-04-12 ENCOUNTER — Ambulatory Visit
Admission: RE | Admit: 2011-04-12 | Discharge: 2011-04-12 | Disposition: A | Discharge status: Home / Self Care | Payer: BC Managed Care – PPO | Source: Ambulatory Visit | Attending: Internal Medicine | Admitting: Internal Medicine

## 2011-04-12 DIAGNOSIS — Z1231 Encounter for screening mammogram for malignant neoplasm of breast: Secondary | ICD-10-CM

## 2011-04-29 NOTE — H&P (Signed)
   Nicole Gonzalez, LAPAGLIA NO.:  [id number]   MEDICAL RECORD NO.:  [id number]                   PATIENT TYPE:   LOCATION:                                       FACILITY:  [facility]   PHYSICIAN:  Lenoard Aden, M.D.             DATE OF BIRTH:   DATE OF ADMISSION:  10/23/2002  DATE OF DISCHARGE:                                HISTORY & PHYSICAL   CHIEF COMPLAINT:  Labor.   HISTORY OF PRESENT ILLNESS:  The patient is a 40 year old white female, G3,  P1, EDD November 07, 2002 at 37-6/[redacted] weeks gestation in early labor.   PAST MEDICAL HISTORY:  Uncomplicated vaginal delivery 1998, 10-week SAB in  2002.   ALLERGIES:  STADOL.   MEDICATIONS:  Prenatal vitamins.   FAMILY HISTORY:  Cerebrovascular disease, hydrocephaly, Graves disease,  adult-onset diabetes, emphysema, and heart disease.   PRENATAL LABORATORY DATA:  Blood type of O positive, Rh antibody negative,  rubella immune, hepatitis B surface antigen negative, HIV nonreactive;  GC/Chlamydia not performed; the patient declined triple screen.   PHYSICAL EXAMINATION:  GENERAL: She is an obese-appearing white female; no  apparent distress.  VITAL SIGNS: Blood pressure 116/75.  HEENT: Normal.  LUNGS: Clear.  HEART: Regular rate and rhythm.  ABDOMEN: Soft, gravid, nontender.  CERVIX: Cervix is 4 cm, 70%, vertex, -2.  EXTREMITIES: No cords.  NEUROLOGICAL: Nonfocal.   IMPRESSION:  1. Term intrauterine pregnancy with cervical change noted over the last 2     hours.  2. Asymptomatic uterine fibroid.  3. History of gestational diabetes with normal screening this pregnancy.  4. Desires postpartum tubal ligation.    PLAN:  Admit, Pitocin augmentation as necessary, AROM, epidural, anticipate  attempts at vaginal delivery.  Risks of tubal ligation, failure risk of 5-10  per 1000 noted.  Risks of anesthesia, infection, bleeding, injury to  abdominal organs and need for repairs discussed, delayed  risks/immediate  complications to include bowel and bladder injury discussed.  The patient  acknowledges and wishes to proceed.                                               Lenoard Aden, M.D.    RJT/MEDQ  D:  10/23/2002  T:  10/23/2002  Job:  [id number]

## 2011-09-17 ENCOUNTER — Other Ambulatory Visit: Payer: Self-pay | Admitting: Internal Medicine

## 2011-09-21 ENCOUNTER — Ambulatory Visit (INDEPENDENT_AMBULATORY_CARE_PROVIDER_SITE_OTHER): Payer: BC Managed Care – PPO

## 2011-09-21 DIAGNOSIS — Z23 Encounter for immunization: Secondary | ICD-10-CM

## 2012-01-30 ENCOUNTER — Other Ambulatory Visit: Payer: Self-pay | Admitting: Internal Medicine

## 2012-03-02 ENCOUNTER — Other Ambulatory Visit (INDEPENDENT_AMBULATORY_CARE_PROVIDER_SITE_OTHER): Payer: BC Managed Care – PPO

## 2012-03-02 DIAGNOSIS — Z0000 Encounter for general adult medical examination without abnormal findings: Secondary | ICD-10-CM

## 2012-03-02 LAB — BASIC METABOLIC PANEL
BUN: 15 mg/dL (ref 6–23)
CO2: 25 mEq/L (ref 19–32)
Calcium: 8.9 mg/dL (ref 8.4–10.5)
Chloride: 102 mEq/L (ref 96–112)
Creatinine, Ser: 0.5 mg/dL (ref 0.4–1.2)
GFR: 131.59 mL/min (ref >60.00)
Glucose, Bld: 102 mg/dL — ABNORMAL HIGH (ref 70–99)
Potassium: 4.1 mEq/L (ref 3.5–5.1)
Sodium: 137 mEq/L (ref 135–145)

## 2012-03-02 LAB — POCT URINALYSIS DIPSTICK
Bilirubin, UA: NEGATIVE
Blood, UA: NEGATIVE
Glucose, UA: NEGATIVE
Ketones, UA: NEGATIVE
Leukocytes, UA: NEGATIVE
Nitrite, UA: NEGATIVE
Protein, UA: NEGATIVE
Spec Grav, UA: 1.025
Urobilinogen, UA: 0.2
pH, UA: 5.5

## 2012-03-02 LAB — LIPID PANEL
HDL: 53.6 mg/dL (ref >39.00)
Total CHOL/HDL Ratio: 3
VLDL: 12.8 mg/dL (ref 0.0–40.0)

## 2012-03-02 LAB — CBC WITH DIFFERENTIAL/PLATELET
Basophils Absolute: 0 10*3/uL (ref 0.0–0.1)
Basophils Relative: 0.5 % (ref 0.0–3.0)
Eosinophils Absolute: 0.2 10*3/uL (ref 0.0–0.7)
Eosinophils Relative: 2.5 % (ref 0.0–5.0)
HCT: 39 % (ref 36.0–46.0)
Hemoglobin: 12.9 g/dL (ref 12.0–15.0)
Lymphocytes Relative: 29.8 % (ref 12.0–46.0)
Lymphs Abs: 1.8 10*3/uL (ref 0.7–4.0)
MCHC: 33.2 g/dL (ref 30.0–36.0)
MCV: 86.3 fl (ref 78.0–100.0)
Monocytes Absolute: 0.3 10*3/uL (ref 0.1–1.0)
Monocytes Relative: 5.7 % (ref 3.0–12.0)
Neutro Abs: 3.7 10*3/uL (ref 1.4–7.7)
Neutrophils Relative %: 61.5 % (ref 43.0–77.0)
Platelets: 201 10*3/uL (ref 150.0–400.0)
RBC: 4.51 Mil/uL (ref 3.87–5.11)
RDW: 13.6 % (ref 11.5–14.6)
WBC: 6.1 10*3/uL (ref 4.5–10.5)

## 2012-03-02 LAB — HEPATIC FUNCTION PANEL
ALT: 23 U/L (ref 0–35)
AST: 18 U/L (ref 0–37)
Albumin: 4.1 g/dL (ref 3.5–5.2)
Alkaline Phosphatase: 64 U/L (ref 39–117)
Bilirubin, Direct: 0 mg/dL (ref 0.0–0.3)
Total Bilirubin: 0.7 mg/dL (ref 0.3–1.2)
Total Protein: 7 g/dL (ref 6.0–8.3)

## 2012-03-02 LAB — TSH: TSH: 1.15 u[IU]/mL (ref 0.35–5.50)

## 2012-03-09 ENCOUNTER — Encounter: Payer: BC Managed Care – PPO | Admitting: Internal Medicine

## 2012-03-13 ENCOUNTER — Ambulatory Visit (INDEPENDENT_AMBULATORY_CARE_PROVIDER_SITE_OTHER): Payer: BC Managed Care – PPO | Admitting: Internal Medicine

## 2012-03-13 ENCOUNTER — Encounter: Payer: Self-pay | Admitting: Internal Medicine

## 2012-03-13 VITALS — BP 112/82 | HR 102 | Temp 98.5°F | Wt 215.0 lb

## 2012-03-13 DIAGNOSIS — E785 Hyperlipidemia, unspecified: Secondary | ICD-10-CM

## 2012-03-13 DIAGNOSIS — R519 Headache, unspecified: Secondary | ICD-10-CM

## 2012-03-13 DIAGNOSIS — R7309 Other abnormal glucose: Secondary | ICD-10-CM

## 2012-03-13 DIAGNOSIS — R058 Other specified cough: Secondary | ICD-10-CM

## 2012-03-13 DIAGNOSIS — K209 Esophagitis, unspecified without bleeding: Secondary | ICD-10-CM

## 2012-03-13 DIAGNOSIS — R51 Headache: Secondary | ICD-10-CM

## 2012-03-13 DIAGNOSIS — J309 Allergic rhinitis, unspecified: Secondary | ICD-10-CM

## 2012-03-13 DIAGNOSIS — Z0000 Encounter for general adult medical examination without abnormal findings: Secondary | ICD-10-CM

## 2012-03-13 DIAGNOSIS — I10 Essential (primary) hypertension: Secondary | ICD-10-CM

## 2012-03-13 DIAGNOSIS — R05 Cough: Secondary | ICD-10-CM

## 2012-03-13 DIAGNOSIS — Z87898 Personal history of other specified conditions: Secondary | ICD-10-CM

## 2012-03-13 DIAGNOSIS — R059 Cough, unspecified: Secondary | ICD-10-CM

## 2012-03-13 DIAGNOSIS — E669 Obesity, unspecified: Secondary | ICD-10-CM

## 2012-03-13 DIAGNOSIS — Z8742 Personal history of other diseases of the female genital tract: Secondary | ICD-10-CM

## 2012-03-13 MED ORDER — FLUTICASONE PROPIONATE 50 MCG/ACT NA SUSP: 2.0000 | Freq: Every day | NASAL | Status: DC

## 2012-03-13 NOTE — Patient Instructions (Addendum)
Weight loss will help possible reflux headaches blood pressure and avoid diagnosis of diabetes.  Please concentrate your efforts on reducing calories eating healthy and being as active as possible.  In the meantime I want you to take the Flonase every day and we will do an allergy referral to see if there are allergens that are irritating her airway and causing the headache pressures.  It is possible that you have not sleep apnea or upper airway resistance syndrome that is adding to your symptoms of fatigue nocturnal choking and coughing. He may also have nighttime reflux.  Stay on the Prilosec for now and do lifestyle changes with weight loss to try to control the reflux.  Office visit after you have allergy evaluation at that point we may get a sleep pulmonary consult if you're not better   Send in the stool cords for microscopic blood screening because of the right-sided intermittent pain

## 2012-03-13 NOTE — Progress Notes (Signed)
Subjective:    Patient ID: Nicole Gonzalez, female    DOB: 17-Jun-1971, 41 y.o.   MRN: [id number]  HPI Patient comes in today for Preventive Health Care visit  No major changes ; ,injury surgery or hospitalizations. Did have basal cell on scalp face and under surveillance  Med problems  HT:  Doing well  LIPIDS:   No se of meds   WEIGHT: no change  GER  prislosec working ok   Has allergies and HAs .  Seasonal sx nocturnal cough and sometimes choking and throat clearing ? From drainage some snoring  fam hx of poss OSA .  Tired some   Review of Systems ROS:  GEN/ HEENT: No fever, significant weight changes sweats vision problems hearing changes, CV/ PULM; No chest pain shortness of breath  syncope,edema  change in exercise tolerance. GI /GU: No a, vomiting, change in bowel habits. No blood in the stool. No significant GU symptoms. Right sided off and on discomfort not related to gi or gu function  Not severe SKIN/HEME: ,no acute skin rashes suspicious lesions or bleeding. No lymphadenopathy, nodules, masses.  NEURO/ PSYCH:  No neurologic signs such as weakness numbness. No depression anxiety. IMM/ Allergy: No unusual infections.  Allergy .   REST of 12 system review negative except as per HPI Past history family history social history reviewed in the electronic medical record.   Outpatient Prescriptions Prior to Visit  Medication Sig Dispense Refill  . lisinopril (PRINIVIL,ZESTRIL) 10 MG tablet 1 BY MOUTH ONCE DAILY FOR HIGH BLOOD PRESSURE  30 tablet  2  . omeprazole (PRILOSEC) 20 MG capsule Take 20 mg by mouth daily.        . simvastatin (ZOCOR) 40 MG tablet TAKE 1 TABLET DAILY FOR HIGH CHOLESTEROL  90 tablet  4  . fluticasone (FLONASE) 50 MCG/ACT nasal spray 2 sprays by Nasal route daily.             Objective:   Physical Exam BP 112/82  Pulse 102  Temp(Src) 98.5 F (36.9 C) (Oral)  Wt 215 lb (97.523 kg)  SpO2 97% Wt Readings from Last 3 Encounters:  03/13/12 215 lb (97.523  kg)  03/07/11 214 lb (97.07 kg)  09/01/10 215 lb (97.523 kg)   Physical Exam: Vital signs reviewed [id number] is a well-developed well-nourished alert cooperative  white female who appears her stated age in no acute distress.  HEENT: normocephalic atraumatic , Eyes: PERRL EOM's full, conjunctiva clear, Nares: paten,t no deformity discharge or tenderness., Ears: no deformity EAC's clear TMs with normal landmarks. Mouth: clear OP, no lesions, edema.  Low palate  Moist mucous membranes. Dentition in adequate repair. NECK: supple without masses, thyromegaly or bruits. CHEST/PULM:  Clear to auscultation and percussion breath sounds equal no wheeze , rales or rhonchi. No chest wall deformities or tenderness. CV: PMI is nondisplaced, S1 S2 no gallops, murmurs, rubs. Peripheral pulses are full without delay.No JVD .  Breast: normal by inspection . No dimpling, discharge, masses, tenderness or discharge .  ABDOMEN: Bowel sounds normal nontender  No guard or rebound, no hepato splenomegal no CVA tenderness.  No hernia. Extremtities:  No clubbing cyanosis or edema, no acute joint swelling or redness no focal atrophy NEURO:  Oriented x3, cranial nerves 3-12 appear to be intact, no obvious focal weakness,gait within normal limits no abnormal reflexes or asymmetrical SKIN: No acute rashes normal turgor, color, no bruising or petechiae. PSYCH: Oriented, good eye contact, no obvious depression anxiety, cognition and judgment appear  normal. LN: no cervical axillary inguinal adenopathy    Lab Results  Component Value Date   WBC 6.1 03/02/2012   HGB 12.9 03/02/2012   HCT 39.0 03/02/2012   PLT 201.0 03/02/2012   GLUCOSE 102* 03/02/2012   CHOL 170 03/02/2012   TRIG 64.0 03/02/2012   HDL 53.60 03/02/2012   LDLDIRECT 172.4 10/02/2007   LDLCALC 104* 03/02/2012   ALT 23 03/02/2012   AST 18 03/02/2012   NA 137 03/02/2012   K 4.1 03/02/2012   CL 102 03/02/2012   CREATININE 0.5 03/02/2012   BUN 15 03/02/2012   CO2 25  03/02/2012   TSH 1.15 03/02/2012   HGBA1C 6.2 03/02/2012       Assessment & Plan:  Preventive Health Care Counseled regarding healthy nutrition, exercise, sleep, injury prevention, calcium vit d and healthy weight .  Allergic : says  Gets dizzy this time ear and has    HT stable LIPIDS Stable GERD   prob ongoing and poss related to other sx  HAs  And popping.   On flonase   Not helping and cough at night.  Has stopped milk .  Nocturnal sx and cough  suspec she also could have sleep apnea or upper airway resistance problems based on hx and exam  Am fatighe and inc has  Hx of gest DM  Obesity  Vague non descript ocass right side pain  At this time get stool hemoccult

## 2012-03-15 ENCOUNTER — Telehealth: Payer: Self-pay | Admitting: Internal Medicine

## 2012-03-15 DIAGNOSIS — R05 Cough: Secondary | ICD-10-CM | POA: Insufficient documentation

## 2012-03-15 DIAGNOSIS — R519 Headache, unspecified: Secondary | ICD-10-CM | POA: Insufficient documentation

## 2012-03-15 DIAGNOSIS — Z0000 Encounter for general adult medical examination without abnormal findings: Secondary | ICD-10-CM | POA: Insufficient documentation

## 2012-03-15 DIAGNOSIS — R058 Other specified cough: Secondary | ICD-10-CM | POA: Insufficient documentation

## 2012-03-15 DIAGNOSIS — Z87898 Personal history of other specified conditions: Secondary | ICD-10-CM | POA: Insufficient documentation

## 2012-03-15 HISTORY — DX: Headache, unspecified: R51.9

## 2012-03-15 NOTE — Telephone Encounter (Signed)
Patient called stating that she would like to be called back on her cell phone reguarding referral.

## 2012-03-15 NOTE — Telephone Encounter (Signed)
I have not received a referral for this pt, yet.  Can you please check into this?  Thanks.

## 2012-03-15 NOTE — Telephone Encounter (Signed)
Pt just wants to be called on cell phone when the referral has been made.

## 2012-03-15 NOTE — Telephone Encounter (Signed)
Just put the order in sorry i forgot to put in on  4/2 . Please check with terri otherwise

## 2012-03-16 ENCOUNTER — Encounter: Payer: Self-pay | Admitting: Internal Medicine

## 2012-03-16 DIAGNOSIS — Z9889 Other specified postprocedural states: Secondary | ICD-10-CM

## 2012-03-16 DIAGNOSIS — Z85828 Personal history of other malignant neoplasm of skin: Secondary | ICD-10-CM | POA: Insufficient documentation

## 2012-04-23 ENCOUNTER — Other Ambulatory Visit: Payer: Self-pay | Admitting: Internal Medicine

## 2012-04-23 DIAGNOSIS — Z1231 Encounter for screening mammogram for malignant neoplasm of breast: Secondary | ICD-10-CM

## 2012-04-27 ENCOUNTER — Ambulatory Visit
Admission: RE | Admit: 2012-04-27 | Discharge: 2012-04-27 | Disposition: A | Discharge status: Home / Self Care | Payer: BC Managed Care – PPO | Source: Ambulatory Visit | Attending: Internal Medicine | Admitting: Internal Medicine

## 2012-04-27 DIAGNOSIS — Z1231 Encounter for screening mammogram for malignant neoplasm of breast: Secondary | ICD-10-CM

## 2012-05-04 ENCOUNTER — Other Ambulatory Visit: Payer: Self-pay | Admitting: Internal Medicine

## 2012-07-23 ENCOUNTER — Other Ambulatory Visit: Payer: Self-pay | Admitting: Internal Medicine

## 2012-09-26 ENCOUNTER — Ambulatory Visit: Payer: BC Managed Care – PPO

## 2012-09-26 ENCOUNTER — Ambulatory Visit (INDEPENDENT_AMBULATORY_CARE_PROVIDER_SITE_OTHER): Payer: BC Managed Care – PPO

## 2012-09-26 DIAGNOSIS — Z23 Encounter for immunization: Secondary | ICD-10-CM

## 2012-11-15 ENCOUNTER — Other Ambulatory Visit: Payer: Self-pay | Admitting: Family Medicine

## 2012-11-15 MED ORDER — SIMVASTATIN 40 MG PO TABS: 40.0000 mg | ORAL_TABLET | Freq: Every day | ORAL | Status: DC

## 2013-01-29 ENCOUNTER — Other Ambulatory Visit: Payer: Self-pay | Admitting: Internal Medicine

## 2013-01-30 ENCOUNTER — Other Ambulatory Visit: Payer: Self-pay | Admitting: Internal Medicine

## 2013-02-13 ENCOUNTER — Telehealth: Payer: Self-pay | Admitting: Internal Medicine

## 2013-02-13 NOTE — Telephone Encounter (Signed)
Patient notified to pick up at the front desk. 

## 2013-02-13 NOTE — Telephone Encounter (Signed)
Pt said last year she was given a card for a stool sample, but she misread card.  Would like to proceed with this now so she an discuss at her [place] appt. Can she come by and pick up another card? Pls advise.

## 2013-02-13 NOTE — Telephone Encounter (Signed)
Certainly

## 2013-02-13 NOTE — Telephone Encounter (Signed)
Should we get a card?

## 2013-02-19 ENCOUNTER — Other Ambulatory Visit (INDEPENDENT_AMBULATORY_CARE_PROVIDER_SITE_OTHER): Payer: BC Managed Care – PPO

## 2013-02-19 DIAGNOSIS — D62 Acute posthemorrhagic anemia: Secondary | ICD-10-CM

## 2013-02-19 LAB — HEMOCCULT GUIAC POC 1CARD (OFFICE): Card #3 Fecal Occult Blood, POC: NEGATIVE

## 2013-03-15 ENCOUNTER — Ambulatory Visit: Payer: BC Managed Care – PPO

## 2013-03-15 ENCOUNTER — Other Ambulatory Visit (INDEPENDENT_AMBULATORY_CARE_PROVIDER_SITE_OTHER): Payer: BC Managed Care – PPO

## 2013-03-15 DIAGNOSIS — R7309 Other abnormal glucose: Secondary | ICD-10-CM

## 2013-03-15 DIAGNOSIS — Z0000 Encounter for general adult medical examination without abnormal findings: Secondary | ICD-10-CM

## 2013-03-15 LAB — CBC WITH DIFFERENTIAL/PLATELET
Basophils Absolute: 0 10*3/uL (ref 0.0–0.1)
Basophils Relative: 0.4 % (ref 0.0–3.0)
Eosinophils Absolute: 0.1 10*3/uL (ref 0.0–0.7)
Eosinophils Relative: 1.6 % (ref 0.0–5.0)
HCT: 41.3 % (ref 36.0–46.0)
Hemoglobin: 13.9 g/dL (ref 12.0–15.0)
Lymphocytes Relative: 24.7 % (ref 12.0–46.0)
Lymphs Abs: 2.1 10*3/uL (ref 0.7–4.0)
MCHC: 33.6 g/dL (ref 30.0–36.0)
MCV: 86.4 fl (ref 78.0–100.0)
Monocytes Absolute: 0.5 10*3/uL (ref 0.1–1.0)
Monocytes Relative: 5.5 % (ref 3.0–12.0)
Neutro Abs: 5.7 10*3/uL (ref 1.4–7.7)
Neutrophils Relative %: 67.8 % (ref 43.0–77.0)
Platelets: 218 10*3/uL (ref 150.0–400.0)
RBC: 4.78 Mil/uL (ref 3.87–5.11)
RDW: 13 % (ref 11.5–14.6)
WBC: 8.4 10*3/uL (ref 4.5–10.5)

## 2013-03-15 LAB — BASIC METABOLIC PANEL
Chloride: 101 mEq/L (ref 96–112)
Creatinine, Ser: 0.6 mg/dL (ref 0.4–1.2)
GFR: 128.2 mL/min (ref >60.00)
Potassium: 4.6 mEq/L (ref 3.5–5.1)

## 2013-03-15 LAB — HEPATIC FUNCTION PANEL
ALT: 28 U/L (ref 0–35)
AST: 24 U/L (ref 0–37)
Alkaline Phosphatase: 66 U/L (ref 39–117)
Bilirubin, Direct: 0.1 mg/dL (ref 0.0–0.3)
Total Protein: 7.5 g/dL (ref 6.0–8.3)

## 2013-03-15 LAB — TSH: TSH: 2.05 u[IU]/mL (ref 0.35–5.50)

## 2013-03-15 LAB — HEMOGLOBIN A1C: Hgb A1c MFr Bld: 7 % — ABNORMAL HIGH (ref 4.6–6.5)

## 2013-03-15 LAB — LIPID PANEL: VLDL: 13.8 mg/dL (ref 0.0–40.0)

## 2013-03-22 ENCOUNTER — Ambulatory Visit (INDEPENDENT_AMBULATORY_CARE_PROVIDER_SITE_OTHER): Payer: BC Managed Care – PPO | Admitting: Internal Medicine

## 2013-03-22 ENCOUNTER — Encounter: Payer: Self-pay | Admitting: Internal Medicine

## 2013-03-22 VITALS — BP 112/82 | HR 97 | Temp 98.1°F | Ht 63.75 in | Wt 219.0 lb

## 2013-03-22 DIAGNOSIS — E119 Type 2 diabetes mellitus without complications: Secondary | ICD-10-CM

## 2013-03-22 DIAGNOSIS — G8929 Other chronic pain: Secondary | ICD-10-CM | POA: Insufficient documentation

## 2013-03-22 DIAGNOSIS — E785 Hyperlipidemia, unspecified: Secondary | ICD-10-CM

## 2013-03-22 DIAGNOSIS — R1031 Right lower quadrant pain: Secondary | ICD-10-CM

## 2013-03-22 DIAGNOSIS — I10 Essential (primary) hypertension: Secondary | ICD-10-CM

## 2013-03-22 DIAGNOSIS — M2170 Unequal limb length (acquired), unspecified site: Secondary | ICD-10-CM

## 2013-03-22 DIAGNOSIS — Z0000 Encounter for general adult medical examination without abnormal findings: Secondary | ICD-10-CM

## 2013-03-22 MED ORDER — METFORMIN HCL ER 500 MG PO TB24: 500.0000 mg | ORAL_TABLET | Freq: Every day | ORAL | Status: DC

## 2013-03-22 NOTE — Progress Notes (Signed)
Chief Complaint  Patient presents with  . Annual Exam    HPI: Patient comes in today for Preventive Health Care visit   Since her last visit no major changes in her health status.  Allergy asthma is so much better having seen the allergist on Singulair suppression nasal spray.  Bp;  on lisinopril no side effects controlled  Lipids;  no side effects of medication.  Progressive problem RLQ pain discomfort    5+ /10now continuous and not related to  Other bowel functions did see the GYN and did not think that was gynecologic question whether it could be GI related. Occasionally radiates down to the anterior thigh but not related to walking bowel habits urinary symptoms. No radiation otherwise. Doesn't feel a bulge like a hernia and not worse with coughing or sneezing. Gets hot flashes somewhat so sleep is somewhat interrupted. Stress at work coping ROS:  GEN/ HEENT: No fever, significant weight changes sweats headaches vision problems hearing changes, CV/ PULM; No chest pain shortness of breath cough, syncope,edema  change in exercise tolerance. GI /GU: No adominal pain, vomiting, change in bowel habits. No blood in the stool. No significant GU symptoms. SKIN/HEME: ,no acute skin rashes suspicious lesions or bleeding. No lymphadenopathy, nodules, masses.  NEURO/ PSYCH:  No neurologic signs such as weakness numbness. No depression anxiety. Has a leg length discrepancy does wear an insert. IMM/ Allergy: No unusual infections.  Allergy .   REST of 12 system review negative except as per HPI   Past Medical History  Diagnosis Date  . Hyperlipidemia     [id number] trx   . History of gestational diabetes     x2  . Anemia      better after hysterectomy  . Fracture of lower leg      fall 2011  . History of basal cell carcinoma excision   . DIABETES MELLITUS, GESTATIONAL, HX OF 06/02/2009    Qualifier: Diagnosis of  By: Fabian Sharp MD, Neta Mends   . Increased frequency of headaches 03/15/2012  .  UNSPECIFIED TACHYCARDIA 09/01/2010    Qualifier: Diagnosis of  By: Abner Greenspan MD, Misty Stanley      Family History  Problem Relation Age of Onset  . Melanoma Father   . Hypertension Father   . Diabetes      family hx  . Hyperlipidemia      family hx  . Squamous cell carcinoma Father   . Arthritis Brother      Psoriatic    History   Social History  . Marital Status: Married    Spouse Name: N/A    Number of Children: N/A  . Years of Education: N/A   Occupational History  . Insurance underwriter    Social History Main Topics  . Smoking status: Never Smoker   . Smokeless tobacco: None  . Alcohol Use: None  . Drug Use: None  . Sexually Active: None   Other Topics Concern  . 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    Nonsmoker married    Outpatient Encounter Prescriptions as of 03/22/2013  Medication Sig Dispense Refill  . fexofenadine (ALLEGRA) 180 MG tablet Take 180 mg by mouth daily.      Marland Kitchen lisinopril (PRINIVIL,ZESTRIL) 10 MG tablet 1 BY MOUTH ONCE DAILY FOR HIGH BLOOD PRESSURE  30  tablet  1  . metFORMIN (GLUCOPHAGE-XR) 500 MG 24 hr tablet Take 1 tablet (500 mg total) by mouth daily with breakfast.  90 tablet  1  . montelukast (SINGULAIR) 10 MG tablet       . omeprazole (PRILOSEC) 20 MG capsule Take 20 mg by mouth daily.       . QNASL 80 MCG/ACT AERS       . simvastatin (ZOCOR) 40 MG tablet Take 40 mg by mouth at bedtime.      . [DISCONTINUED] fluticasone (FLONASE) 50 MCG/ACT nasal spray Place 2 sprays into the nose daily.  16 g  2  . [DISCONTINUED] lisinopril (PRINIVIL,ZESTRIL) 10 MG tablet 1 BY MOUTH ONCE DAILY FOR HIGH BLOOD PRESSURE  30 tablet  1  . [DISCONTINUED] simvastatin (ZOCOR) 40 MG tablet Take 1 tablet (40 mg total) by mouth at bedtime.  90 tablet  1   No facility-administered encounter medications on file as of 03/22/2013.    EXAM:  BP 112/82  Pulse 97   Temp(Src) 98.1 F (36.7 C) (Oral)  Ht 5' 3.75" (1.619 m)  Wt 219 lb (99.338 kg)  BMI 37.9 kg/m2  SpO2 97%  Body mass index is 37.9 kg/(m^2).  Physical Exam: Vital signs reviewed ZOX:WRUE is a well-developed well-nourished alert cooperative   female who appears her stated age in no acute distress.  HEENT: normocephalic atraumatic , Eyes: PERRL EOM's full, conjunctiva clear, wearing glasses Nares: paten,t no deformity discharge or tenderness., Ears: no deformity EAC's clear TMs with normal landmarks. Mouth: clear OP, no lesions, edema.  Moist mucous membranes. Dentition in adequate repair. NECK: supple without masses, thyromegaly or bruits. Breast: normal by inspection . No dimpling, discharge, masses, tenderness or discharge . CHEST/PULM:  Clear to auscultation and percussion breath sounds equal no wheeze , rales or rhonchi. No chest wall deformities or tenderness. CV: PMI is nondisplaced, S1 S2 no gallops, murmurs, rubs. Peripheral pulses are full without delay.No JVD .  ABDOMEN: Bowel sounds normal nontender  No guard or rebound, no hepato splenomegal no CVA tenderness.  No hernia. Area of discomfort is the right lower quadrant not really the groin I don't feel a hernia negative psoas no pain with movement of hip Extremtities:  No clubbing cyanosis or edema, no acute joint swelling or redness no focal atrophy NEURO:  Oriented x3, cranial nerves 3-12 appear to be intact, no obvious focal weakness,gait within normal limits no abnormal reflexes or asymmetrical SKIN: No acute rashes normal turgor, color, no bruising or petechiae. Some mild intertriginous areas PSYCH: Oriented, good eye contact, no obvious depression anxiety, cognition and judgment appear normal. LN: no cervical axillary inguinal adenopathy  Lab Results  Component Value Date   WBC 8.4 03/15/2013   HGB 13.9 03/15/2013   HCT 41.3 03/15/2013   PLT 218.0 03/15/2013   GLUCOSE 141* 03/15/2013   CHOL 179 03/15/2013   TRIG 69.0 03/15/2013    HDL 51.20 03/15/2013   LDLDIRECT 172.4 10/02/2007   LDLCALC 114* 03/15/2013   ALT 28 03/15/2013   AST 24 03/15/2013   NA 136 03/15/2013   K 4.6 03/15/2013   CL 101 03/15/2013   CREATININE 0.6 03/15/2013   BUN 12 03/15/2013   CO2 26 03/15/2013   TSH 2.05 03/15/2013   HGBA1C 7.0* 03/15/2013    ASSESSMENT AND PLAN:  Discussed the following assessment and plan:  Visit for preventive health examination  HYPERTENSION - Controlled  HYPERLIPIDEMIA  Newly diagnosed diabetes - A1c 7.0 FBS 140 had prediabetes and  gestational diabetes previously  Abdominal pain, chronic, right lower quadrant, left - progressing ? cause  get gi consult - Plan: Ambulatory referral to Gastroenterology  Leg length discrepancy  Discussed options strongly suggest adding metformin extended release once a day 500 mg in the morning and have her work very hard on her lifestyle changes with weight loss offered nutrition dietitian consult but she states that she knows what she needs to do. Either way we'll follow up with a hemoglobin A1c in followup in 3 months.  Uncertain cause of her right lower Corder and discomfort but it is persisting and progressive therefore we'll get a GI consult opinion. I suppose it could be adhesions hernia that I can feel other radiating problem. Gynecologist does not feel it is related to a GYN problem  Patient Care Team: Madelin Headings, MD as PCP - General Lauree Chandler, MD as Attending Physician (Obstetrics and Gynecology) Eileen Stanford, MD (Allergy) Patient Instructions  Intensify lifestyle interventions.  weight loss  Will help agree no calorie bevreages unless milk.  Begin metformin 500 mg once a day .  Repeat hg a1c  And OV in 3 months or so .  someone will contact you about  The Gi opinion about the right side pain.   Preventive Care for Adults, Female A healthy lifestyle and preventive care can promote health and wellness. Preventive health guidelines for women include the following  key practices.  A routine yearly physical is a good way to check with your caregiver about your health and preventive screening. It is a chance to share any concerns and updates on your health, and to receive a thorough exam.  Visit your dentist for a routine exam and preventive care every 6 months. Brush your teeth twice a day and floss once a day. Good oral hygiene prevents tooth decay and gum disease.  The frequency of eye exams is based on your age, health, family medical history, use of contact lenses, and other factors. Follow your caregiver's recommendations for frequency of eye exams.  Eat a healthy diet. Foods like vegetables, fruits, whole grains, low-fat dairy products, and lean protein foods contain the nutrients you need without too many calories. Decrease your intake of foods high in solid fats, added sugars, and salt. Eat the right amount of calories for you.Get information about a proper diet from your caregiver, if necessary.  Regular physical exercise is one of the most important things you can do for your health. Most adults should get at least 150 minutes of moderate-intensity exercise (any activity that increases your heart rate and causes you to sweat) each week. In addition, most adults need muscle-strengthening exercises on 2 or more days a week.  Maintain a healthy weight. The body mass index (BMI) is a screening tool to identify possible weight problems. It provides an estimate of body fat based on height and weight. Your caregiver can help determine your BMI, and can help you achieve or maintain a healthy weight.For adults 20 years and older:  A BMI below 18.5 is considered underweight.  A BMI of 18.5 to 24.9 is normal.  A BMI of 25 to 29.9 is considered overweight.  A BMI of 30 and above is considered obese.  Maintain normal blood lipids and cholesterol levels by exercising and minimizing your intake of saturated fat. Eat a balanced diet with plenty of fruit and  vegetables. Blood tests for lipids and cholesterol should begin at age 63 and be repeated every 5 years.  If your lipid or cholesterol levels are high, you are over 50, or you are at high risk for heart disease, you may need your cholesterol levels checked more frequently.Ongoing high lipid and cholesterol levels should be treated with medicines if diet and exercise are not effective.  If you smoke, find out from your caregiver how to quit. If you do not use tobacco, do not start.  If you are pregnant, do not drink alcohol. If you are breastfeeding, be very cautious about drinking alcohol. If you are not pregnant and choose to drink alcohol, do not exceed 1 drink per day. One drink is considered to be 12 ounces (355 mL) of beer, 5 ounces (148 mL) of wine, or 1.5 ounces (44 mL) of liquor.  Avoid use of street drugs. Do not share needles with anyone. Ask for help if you need support or instructions about stopping the use of drugs.  High blood pressure causes heart disease and increases the risk of stroke. Your blood pressure should be checked at least every 1 to 2 years. Ongoing high blood pressure should be treated with medicines if weight loss and exercise are not effective.  If you are 60 to 43 years old, ask your caregiver if you should take aspirin to prevent strokes.  Diabetes screening involves taking a blood sample to check your fasting blood sugar level. This should be done once every 3 years, after age 63, if you are within normal weight and without risk factors for diabetes. Testing should be considered at a younger age or be carried out more frequently if you are overweight and have at least 1 risk factor for diabetes.  Breast cancer screening is essential preventive care for women. You should practice "breast self-awareness." This means understanding the normal appearance and feel of your breasts and may include breast self-examination. Any changes detected, no matter how small, should be  reported to a caregiver. Women in their 41s and 30s should have a clinical breast exam (CBE) by a caregiver as part of a regular health exam every 1 to 3 years. After age 9, women should have a CBE every year. Starting at age 22, women should consider having a mammography (breast X-ray test) every year. Women who have a family history of breast cancer should talk to their caregiver about genetic screening. Women at a high risk of breast cancer should talk to their caregivers about having magnetic resonance imaging (MRI) and a mammography every year.  The Pap test is a screening test for cervical cancer. A Pap test can show cell changes on the cervix that might become cervical cancer if left untreated. A Pap test is a procedure in which cells are obtained and examined from the lower end of the uterus (cervix).  Women should have a Pap test starting at age 47.  Between ages 60 and 93, Pap tests should be repeated every 2 years.  Beginning at age 72, you should have a Pap test every 3 years as long as the past 3 Pap tests have been normal.  Some women have medical problems that increase the chance of getting cervical cancer. Talk to your caregiver about these problems. It is especially important to talk to your caregiver if a new problem develops soon after your last Pap test. In these cases, your caregiver may recommend more frequent screening and Pap tests.  The above recommendations are the same for women who have or have not gotten the vaccine for human papillomavirus (HPV).  If  you had a hysterectomy for a problem that was not cancer or a condition that could lead to cancer, then you no longer need Pap tests. Even if you no longer need a Pap test, a regular exam is a good idea to make sure no other problems are starting.  If you are between ages 28 and 64, and you have had normal Pap tests going back 10 years, you no longer need Pap tests. Even if you no longer need a Pap test, a regular exam is a  good idea to make sure no other problems are starting.  If you have had past treatment for cervical cancer or a condition that could lead to cancer, you need Pap tests and screening for cancer for at least 20 years after your treatment.  If Pap tests have been discontinued, risk factors (such as a new sexual partner) need to be reassessed to determine if screening should be resumed.  The HPV test is an additional test that may be used for cervical cancer screening. The HPV test looks for the virus that can cause the cell changes on the cervix. The cells collected during the Pap test can be tested for HPV. The HPV test could be used to screen women aged 59 years and older, and should be used in women of any age who have unclear Pap test results. After the age of 52, women should have HPV testing at the same frequency as a Pap test.  Colorectal cancer can be detected and often prevented. Most routine colorectal cancer screening begins at the age of 39 and continues through age 22. However, your caregiver may recommend screening at an earlier age if you have risk factors for colon cancer. On a yearly basis, your caregiver may provide home test kits to check for hidden blood in the stool. Use of a small camera at the end of a tube, to directly examine the colon (sigmoidoscopy or colonoscopy), can detect the earliest forms of colorectal cancer. Talk to your caregiver about this at age 52, when routine screening begins. Direct examination of the colon should be repeated every 5 to 10 years through age 46, unless early forms of pre-cancerous polyps or small growths are found.  Hepatitis C blood testing is recommended for all people born from [birth year] through 1965 and any individual with known risks for hepatitis C.  Practice safe sex. Use condoms and avoid high-risk sexual practices to reduce the spread of sexually transmitted infections (STIs). STIs include gonorrhea, chlamydia, syphilis, trichomonas, herpes,  HPV, and human immunodeficiency virus (HIV). Herpes, HIV, and HPV are viral illnesses that have no cure. They can result in disability, cancer, and death. Sexually active women aged 39 and younger should be checked for chlamydia. Older women with new or multiple partners should also be tested for chlamydia. Testing for other STIs is recommended if you are sexually active and at increased risk.  Osteoporosis is a disease in which the bones lose minerals and strength with aging. This can result in serious bone fractures. The risk of osteoporosis can be identified using a bone density scan. Women ages 81 and over and women at risk for fractures or osteoporosis should discuss screening with their caregivers. Ask your caregiver whether you should take a calcium supplement or vitamin D to reduce the rate of osteoporosis.  Menopause can be associated with physical symptoms and risks. Hormone replacement therapy is available to decrease symptoms and risks. You should talk to your caregiver about whether hormone replacement  therapy is right for you.  Use sunscreen with sun protection factor (SPF) of 30 or more. Apply sunscreen liberally and repeatedly throughout the day. You should seek shade when your shadow is shorter than you. Protect yourself by wearing long sleeves, pants, a wide-brimmed hat, and sunglasses year round, whenever you are outdoors.  Once a month, do a whole body skin exam, using a mirror to look at the skin on your back. Notify your caregiver of new moles, moles that have irregular borders, moles that are larger than a pencil eraser, or moles that have changed in shape or color.  Stay current with required immunizations.  Influenza. You need a dose every fall (or winter). The composition of the flu vaccine changes each year, so being vaccinated once is not enough.  Pneumococcal polysaccharide. You need 1 to 2 doses if you smoke cigarettes or if you have certain chronic medical conditions. You  need 1 dose at age 74 (or older) if you have never been vaccinated.  Tetanus, diphtheria, pertussis (Tdap, Td). Get 1 dose of Tdap vaccine if you are younger than age 11, are over 7 and have contact with an infant, are a Research scientist (physical sciences), are pregnant, or simply want to be protected from whooping cough. After that, you need a Td booster dose every 10 years. Consult your caregiver if you have not had at least 3 tetanus and diphtheria-containing shots sometime in your life or have a deep or dirty wound.  HPV. You need this vaccine if you are a woman age 14 or younger. The vaccine is given in 3 doses over 6 months.  Measles, mumps, rubella (MMR). You need at least 1 dose of MMR if you were born in 1957 or later. You may also need a second dose.  Meningococcal. If you are age 51 to 50 and a first-year college student living in a residence hall, or have one of several medical conditions, you need to get vaccinated against meningococcal disease. You may also need additional booster doses.  Zoster (shingles). If you are age 67 or older, you should get this vaccine.  Varicella (chickenpox). If you have never had chickenpox or you were vaccinated but received only 1 dose, talk to your caregiver to find out if you need this vaccine.  Hepatitis A. You need this vaccine if you have a specific risk factor for hepatitis A virus infection or you simply wish to be protected from this disease. The vaccine is usually given as 2 doses, 6 to 18 months apart.  Hepatitis B. You need this vaccine if you have a specific risk factor for hepatitis B virus infection or you simply wish to be protected from this disease. The vaccine is given in 3 doses, usually over 6 months. Preventive Services / Frequency Ages 59 to 19  Blood pressure check.** / Every 1 to 2 years.  Lipid and cholesterol check.** / Every 5 years beginning at age 45.  Clinical breast exam.** / Every 3 years for women in their 40s and 30s.  Pap  test.** / Every 2 years from ages 67 through 55. Every 3 years starting at age 74 through age 1 or 70 with a history of 3 consecutive normal Pap tests.  HPV screening.** / Every 3 years from ages 89 through ages 57 to 79 with a history of 3 consecutive normal Pap tests.  Hepatitis C blood test.** / For any individual with known risks for hepatitis C.  Skin self-exam. / Monthly.  Influenza immunization.** /  Every year.  Pneumococcal polysaccharide immunization.** / 1 to 2 doses if you smoke cigarettes or if you have certain chronic medical conditions.  Tetanus, diphtheria, pertussis (Tdap, Td) immunization. / A one-time dose of Tdap vaccine. After that, you need a Td booster dose every 10 years.  HPV immunization. / 3 doses over 6 months, if you are 47 and younger.  Measles, mumps, rubella (MMR) immunization. / You need at least 1 dose of MMR if you were born in 1957 or later. You may also need a second dose.  Meningococcal immunization. / 1 dose if you are age 13 to 41 and a first-year college student living in a residence hall, or have one of several medical conditions, you need to get vaccinated against meningococcal disease. You may also need additional booster doses.  Varicella immunization.** / Consult your caregiver.  Hepatitis A immunization.** / Consult your caregiver. 2 doses, 6 to 18 months apart.  Hepatitis B immunization.** / Consult your caregiver. 3 doses usually over 6 months. Ages 46 to 68  Blood pressure check.** / Every 1 to 2 years.  Lipid and cholesterol check.** / Every 5 years beginning at age 49.  Clinical breast exam.** / Every year after age 58.  Mammogram.** / Every year beginning at age 64 and continuing for as long as you are in good health. Consult with your caregiver.  Pap test.** / Every 3 years starting at age 59 through age 39 or 8 with a history of 3 consecutive normal Pap tests.  HPV screening.** / Every 3 years from ages 34 through ages 36 to  57 with a history of 3 consecutive normal Pap tests.  Fecal occult blood test (FOBT) of stool. / Every year beginning at age 52 and continuing until age 18. You may not need to do this test if you get a colonoscopy every 10 years.  Flexible sigmoidoscopy or colonoscopy.** / Every 5 years for a flexible sigmoidoscopy or every 10 years for a colonoscopy beginning at age 19 and continuing until age 54.  Hepatitis C blood test.** / For all people born from [birth year] through 1965 and any individual with known risks for hepatitis C.  Skin self-exam. / Monthly.  Influenza immunization.** / Every year.  Pneumococcal polysaccharide immunization.** / 1 to 2 doses if you smoke cigarettes or if you have certain chronic medical conditions.  Tetanus, diphtheria, pertussis (Tdap, Td) immunization.** / A one-time dose of Tdap vaccine. After that, you need a Td booster dose every 10 years.  Measles, mumps, rubella (MMR) immunization. / You need at least 1 dose of MMR if you were born in 1957 or later. You may also need a second dose.  Varicella immunization.** / Consult your caregiver.  Meningococcal immunization.** / Consult your caregiver.  Hepatitis A immunization.** / Consult your caregiver. 2 doses, 6 to 18 months apart.  Hepatitis B immunization.** / Consult your caregiver. 3 doses, usually over 6 months. Ages 52 and over  Blood pressure check.** / Every 1 to 2 years.  Lipid and cholesterol check.** / Every 5 years beginning at age 85.  Clinical breast exam.** / Every year after age 10.  Mammogram.** / Every year beginning at age 87 and continuing for as long as you are in good health. Consult with your caregiver.  Pap test.** / Every 3 years starting at age 63 through age 25 or 80 with a 3 consecutive normal Pap tests. Testing can be stopped between 65 and 70 with 3 consecutive normal Pap  tests and no abnormal Pap or HPV tests in the past 10 years.  HPV screening.** / Every 3 years from ages 31  through ages 39 or 2 with a history of 3 consecutive normal Pap tests. Testing can be stopped between 65 and 70 with 3 consecutive normal Pap tests and no abnormal Pap or HPV tests in the past 10 years.  Fecal occult blood test (FOBT) of stool. / Every year beginning at age 67 and continuing until age 76. You may not need to do this test if you get a colonoscopy every 10 years.  Flexible sigmoidoscopy or colonoscopy.** / Every 5 years for a flexible sigmoidoscopy or every 10 years for a colonoscopy beginning at age 78 and continuing until age 35.  Hepatitis C blood test.** / For all people born from [birth year] through 1966 and any individual with known risks for hepatitis C.  Osteoporosis screening.** / A one-time screening for women ages 83 and over and women at risk for fractures or osteoporosis.  Skin self-exam. / Monthly.  Influenza immunization.** / Every year.  Pneumococcal polysaccharide immunization.** / 1 dose at age 29 (or older) if you have never been vaccinated.  Tetanus, diphtheria, pertussis (Tdap, Td) immunization. / A one-time dose of Tdap vaccine if you are over 65 and have contact with an infant, are a Research scientist (physical sciences), or simply want to be protected from whooping cough. After that, you need a Td booster dose every 10 years.  Varicella immunization.** / Consult your caregiver.  Meningococcal immunization.** / Consult your caregiver.  Hepatitis A immunization.** / Consult your caregiver. 2 doses, 6 to 18 months apart.  Hepatitis B immunization.** / Check with your caregiver. 3 doses, usually over 6 months. ** Family history and personal history of risk and conditions may change your caregiver's recommendations. Document Released: 01/24/2002 Document Revised: 02/20/2012 Document Reviewed: 04/25/2011 [facility] Patient Information 2013 [place], [place].      Neta Mends. Malone Vanblarcom M.D. Health Maintenance  Topic Date Due  . Pap Smear  03/15/2021  . Influenza Vaccine  08/12/2013   . Tetanus/tdap  11/18/2018   Health Maintenance Review At next visit  reviewe immun such as twin rix / prevnar etc.

## 2013-03-22 NOTE — Patient Instructions (Signed)
Intensify lifestyle interventions.  weight loss  Will help agree no calorie bevreages unless milk.  Begin metformin 500 mg once a day .  Repeat hg a1c  And OV in 3 months or so .  someone will contact you about  The Gi opinion about the right side pain.   Preventive Care for Adults, Female A healthy lifestyle and preventive care can promote health and wellness. Preventive health guidelines for women include the following key practices.  A routine yearly physical is a good way to check with your caregiver about your health and preventive screening. It is a chance to share any concerns and updates on your health, and to receive a thorough exam.  Visit your dentist for a routine exam and preventive care every 6 months. Brush your teeth twice a day and floss once a day. Good oral hygiene prevents tooth decay and gum disease.  The frequency of eye exams is based on your age, health, family medical history, use of contact lenses, and other factors. Follow your caregiver's recommendations for frequency of eye exams.  Eat a healthy diet. Foods like vegetables, fruits, whole grains, low-fat dairy products, and lean protein foods contain the nutrients you need without too many calories. Decrease your intake of foods high in solid fats, added sugars, and salt. Eat the right amount of calories for you.Get information about a proper diet from your caregiver, if necessary.  Regular physical exercise is one of the most important things you can do for your health. Most adults should get at least 150 minutes of moderate-intensity exercise (any activity that increases your heart rate and causes you to sweat) each week. In addition, most adults need muscle-strengthening exercises on 2 or more days a week.  Maintain a healthy weight. The body mass index (BMI) is a screening tool to identify possible weight problems. It provides an estimate of body fat based on height and weight. Your caregiver can help determine  your BMI, and can help you achieve or maintain a healthy weight.For adults 20 years and older:  A BMI below 18.5 is considered underweight.  A BMI of 18.5 to 24.9 is normal.  A BMI of 25 to 29.9 is considered overweight.  A BMI of 30 and above is considered obese.  Maintain normal blood lipids and cholesterol levels by exercising and minimizing your intake of saturated fat. Eat a balanced diet with plenty of fruit and vegetables. Blood tests for lipids and cholesterol should begin at age 69 and be repeated every 5 years. If your lipid or cholesterol levels are high, you are over 50, or you are at high risk for heart disease, you may need your cholesterol levels checked more frequently.Ongoing high lipid and cholesterol levels should be treated with medicines if diet and exercise are not effective.  If you smoke, find out from your caregiver how to quit. If you do not use tobacco, do not start.  If you are pregnant, do not drink alcohol. If you are breastfeeding, be very cautious about drinking alcohol. If you are not pregnant and choose to drink alcohol, do not exceed 1 drink per day. One drink is considered to be 12 ounces (355 mL) of beer, 5 ounces (148 mL) of wine, or 1.5 ounces (44 mL) of liquor.  Avoid use of street drugs. Do not share needles with anyone. Ask for help if you need support or instructions about stopping the use of drugs.  High blood pressure causes heart disease and increases the risk  of stroke. Your blood pressure should be checked at least every 1 to 2 years. Ongoing high blood pressure should be treated with medicines if weight loss and exercise are not effective.  If you are 72 to 43 years old, ask your caregiver if you should take aspirin to prevent strokes.  Diabetes screening involves taking a blood sample to check your fasting blood sugar level. This should be done once every 3 years, after age 44, if you are within normal weight and without risk factors for  diabetes. Testing should be considered at a younger age or be carried out more frequently if you are overweight and have at least 1 risk factor for diabetes.  Breast cancer screening is essential preventive care for women. You should practice "breast self-awareness." This means understanding the normal appearance and feel of your breasts and may include breast self-examination. Any changes detected, no matter how small, should be reported to a caregiver. Women in their 40s and 30s should have a clinical breast exam (CBE) by a caregiver as part of a regular health exam every 1 to 3 years. After age 75, women should have a CBE every year. Starting at age 84, women should consider having a mammography (breast X-ray test) every year. Women who have a family history of breast cancer should talk to their caregiver about genetic screening. Women at a high risk of breast cancer should talk to their caregivers about having magnetic resonance imaging (MRI) and a mammography every year.  The Pap test is a screening test for cervical cancer. A Pap test can show cell changes on the cervix that might become cervical cancer if left untreated. A Pap test is a procedure in which cells are obtained and examined from the lower end of the uterus (cervix).  Women should have a Pap test starting at age 40.  Between ages 41 and 8, Pap tests should be repeated every 2 years.  Beginning at age 70, you should have a Pap test every 3 years as long as the past 3 Pap tests have been normal.  Some women have medical problems that increase the chance of getting cervical cancer. Talk to your caregiver about these problems. It is especially important to talk to your caregiver if a new problem develops soon after your last Pap test. In these cases, your caregiver may recommend more frequent screening and Pap tests.  The above recommendations are the same for women who have or have not gotten the vaccine for human papillomavirus  (HPV).  If you had a hysterectomy for a problem that was not cancer or a condition that could lead to cancer, then you no longer need Pap tests. Even if you no longer need a Pap test, a regular exam is a good idea to make sure no other problems are starting.  If you are between ages 10 and 57, and you have had normal Pap tests going back 10 years, you no longer need Pap tests. Even if you no longer need a Pap test, a regular exam is a good idea to make sure no other problems are starting.  If you have had past treatment for cervical cancer or a condition that could lead to cancer, you need Pap tests and screening for cancer for at least 20 years after your treatment.  If Pap tests have been discontinued, risk factors (such as a new sexual partner) need to be reassessed to determine if screening should be resumed.  The HPV test is an  additional test that may be used for cervical cancer screening. The HPV test looks for the virus that can cause the cell changes on the cervix. The cells collected during the Pap test can be tested for HPV. The HPV test could be used to screen women aged 11 years and older, and should be used in women of any age who have unclear Pap test results. After the age of 17, women should have HPV testing at the same frequency as a Pap test.  Colorectal cancer can be detected and often prevented. Most routine colorectal cancer screening begins at the age of 74 and continues through age 40. However, your caregiver may recommend screening at an earlier age if you have risk factors for colon cancer. On a yearly basis, your caregiver may provide home test kits to check for hidden blood in the stool. Use of a small camera at the end of a tube, to directly examine the colon (sigmoidoscopy or colonoscopy), can detect the earliest forms of colorectal cancer. Talk to your caregiver about this at age 74, when routine screening begins. Direct examination of the colon should be repeated every 5  to 10 years through age 40, unless early forms of pre-cancerous polyps or small growths are found.  Hepatitis C blood testing is recommended for all people born from [birth year] through 1965 and any individual with known risks for hepatitis C.  Practice safe sex. Use condoms and avoid high-risk sexual practices to reduce the spread of sexually transmitted infections (STIs). STIs include gonorrhea, chlamydia, syphilis, trichomonas, herpes, HPV, and human immunodeficiency virus (HIV). Herpes, HIV, and HPV are viral illnesses that have no cure. They can result in disability, cancer, and death. Sexually active women aged 6 and younger should be checked for chlamydia. Older women with new or multiple partners should also be tested for chlamydia. Testing for other STIs is recommended if you are sexually active and at increased risk.  Osteoporosis is a disease in which the bones lose minerals and strength with aging. This can result in serious bone fractures. The risk of osteoporosis can be identified using a bone density scan. Women ages 33 and over and women at risk for fractures or osteoporosis should discuss screening with their caregivers. Ask your caregiver whether you should take a calcium supplement or vitamin D to reduce the rate of osteoporosis.  Menopause can be associated with physical symptoms and risks. Hormone replacement therapy is available to decrease symptoms and risks. You should talk to your caregiver about whether hormone replacement therapy is right for you.  Use sunscreen with sun protection factor (SPF) of 30 or more. Apply sunscreen liberally and repeatedly throughout the day. You should seek shade when your shadow is shorter than you. Protect yourself by wearing long sleeves, pants, a wide-brimmed hat, and sunglasses year round, whenever you are outdoors.  Once a month, do a whole body skin exam, using a mirror to look at the skin on your back. Notify your caregiver of new moles, moles that  have irregular borders, moles that are larger than a pencil eraser, or moles that have changed in shape or color.  Stay current with required immunizations.  Influenza. You need a dose every fall (or winter). The composition of the flu vaccine changes each year, so being vaccinated once is not enough.  Pneumococcal polysaccharide. You need 1 to 2 doses if you smoke cigarettes or if you have certain chronic medical conditions. You need 1 dose at age 57 (or older) if  you have never been vaccinated.  Tetanus, diphtheria, pertussis (Tdap, Td). Get 1 dose of Tdap vaccine if you are younger than age 95, are over 93 and have contact with an infant, are a Research scientist (physical sciences), are pregnant, or simply want to be protected from whooping cough. After that, you need a Td booster dose every 10 years. Consult your caregiver if you have not had at least 3 tetanus and diphtheria-containing shots sometime in your life or have a deep or dirty wound.  HPV. You need this vaccine if you are a woman age 68 or younger. The vaccine is given in 3 doses over 6 months.  Measles, mumps, rubella (MMR). You need at least 1 dose of MMR if you were born in 1957 or later. You may also need a second dose.  Meningococcal. If you are age 89 to 14 and a first-year college student living in a residence hall, or have one of several medical conditions, you need to get vaccinated against meningococcal disease. You may also need additional booster doses.  Zoster (shingles). If you are age 29 or older, you should get this vaccine.  Varicella (chickenpox). If you have never had chickenpox or you were vaccinated but received only 1 dose, talk to your caregiver to find out if you need this vaccine.  Hepatitis A. You need this vaccine if you have a specific risk factor for hepatitis A virus infection or you simply wish to be protected from this disease. The vaccine is usually given as 2 doses, 6 to 18 months apart.  Hepatitis B. You need this  vaccine if you have a specific risk factor for hepatitis B virus infection or you simply wish to be protected from this disease. The vaccine is given in 3 doses, usually over 6 months. Preventive Services / Frequency Ages 10 to 42  Blood pressure check.** / Every 1 to 2 years.  Lipid and cholesterol check.** / Every 5 years beginning at age 59.  Clinical breast exam.** / Every 3 years for women in their 48s and 30s.  Pap test.** / Every 2 years from ages 31 through 72. Every 3 years starting at age 48 through age 75 or 38 with a history of 3 consecutive normal Pap tests.  HPV screening.** / Every 3 years from ages 51 through ages 73 to 55 with a history of 3 consecutive normal Pap tests.  Hepatitis C blood test.** / For any individual with known risks for hepatitis C.  Skin self-exam. / Monthly.  Influenza immunization.** / Every year.  Pneumococcal polysaccharide immunization.** / 1 to 2 doses if you smoke cigarettes or if you have certain chronic medical conditions.  Tetanus, diphtheria, pertussis (Tdap, Td) immunization. / A one-time dose of Tdap vaccine. After that, you need a Td booster dose every 10 years.  HPV immunization. / 3 doses over 6 months, if you are 39 and younger.  Measles, mumps, rubella (MMR) immunization. / You need at least 1 dose of MMR if you were born in 1957 or later. You may also need a second dose.  Meningococcal immunization. / 1 dose if you are age 80 to 26 and a first-year college student living in a residence hall, or have one of several medical conditions, you need to get vaccinated against meningococcal disease. You may also need additional booster doses.  Varicella immunization.** / Consult your caregiver.  Hepatitis A immunization.** / Consult your caregiver. 2 doses, 6 to 18 months apart.  Hepatitis B immunization.** / Consult  your caregiver. 3 doses usually over 6 months. Ages 65 to 25  Blood pressure check.** / Every 1 to 2 years.  Lipid  and cholesterol check.** / Every 5 years beginning at age 39.  Clinical breast exam.** / Every year after age 45.  Mammogram.** / Every year beginning at age 78 and continuing for as long as you are in good health. Consult with your caregiver.  Pap test.** / Every 3 years starting at age 67 through age 44 or 79 with a history of 3 consecutive normal Pap tests.  HPV screening.** / Every 3 years from ages 34 through ages 37 to 54 with a history of 3 consecutive normal Pap tests.  Fecal occult blood test (FOBT) of stool. / Every year beginning at age 33 and continuing until age 77. You may not need to do this test if you get a colonoscopy every 10 years.  Flexible sigmoidoscopy or colonoscopy.** / Every 5 years for a flexible sigmoidoscopy or every 10 years for a colonoscopy beginning at age 71 and continuing until age 51.  Hepatitis C blood test.** / For all people born from [birth year] through 1966 and any individual with known risks for hepatitis C.  Skin self-exam. / Monthly.  Influenza immunization.** / Every year.  Pneumococcal polysaccharide immunization.** / 1 to 2 doses if you smoke cigarettes or if you have certain chronic medical conditions.  Tetanus, diphtheria, pertussis (Tdap, Td) immunization.** / A one-time dose of Tdap vaccine. After that, you need a Td booster dose every 10 years.  Measles, mumps, rubella (MMR) immunization. / You need at least 1 dose of MMR if you were born in 1957 or later. You may also need a second dose.  Varicella immunization.** / Consult your caregiver.  Meningococcal immunization.** / Consult your caregiver.  Hepatitis A immunization.** / Consult your caregiver. 2 doses, 6 to 18 months apart.  Hepatitis B immunization.** / Consult your caregiver. 3 doses, usually over 6 months. Ages 52 and over  Blood pressure check.** / Every 1 to 2 years.  Lipid and cholesterol check.** / Every 5 years beginning at age 62.  Clinical breast exam.** / Every year  after age 26.  Mammogram.** / Every year beginning at age 61 and continuing for as long as you are in good health. Consult with your caregiver.  Pap test.** / Every 3 years starting at age 53 through age 35 or 47 with a 3 consecutive normal Pap tests. Testing can be stopped between 65 and 70 with 3 consecutive normal Pap tests and no abnormal Pap or HPV tests in the past 10 years.  HPV screening.** / Every 3 years from ages 67 through ages 20 or 53 with a history of 3 consecutive normal Pap tests. Testing can be stopped between 65 and 70 with 3 consecutive normal Pap tests and no abnormal Pap or HPV tests in the past 10 years.  Fecal occult blood test (FOBT) of stool. / Every year beginning at age 52 and continuing until age 22. You may not need to do this test if you get a colonoscopy every 10 years.  Flexible sigmoidoscopy or colonoscopy.** / Every 5 years for a flexible sigmoidoscopy or every 10 years for a colonoscopy beginning at age 16 and continuing until age 25.  Hepatitis C blood test.** / For all people born from [birth year] through 1966 and any individual with known risks for hepatitis C.  Osteoporosis screening.** / A one-time screening for women ages 44 and over and  women at risk for fractures or osteoporosis.  Skin self-exam. / Monthly.  Influenza immunization.** / Every year.  Pneumococcal polysaccharide immunization.** / 1 dose at age 45 (or older) if you have never been vaccinated.  Tetanus, diphtheria, pertussis (Tdap, Td) immunization. / A one-time dose of Tdap vaccine if you are over 65 and have contact with an infant, are a Research scientist (physical sciences), or simply want to be protected from whooping cough. After that, you need a Td booster dose every 10 years.  Varicella immunization.** / Consult your caregiver.  Meningococcal immunization.** / Consult your caregiver.  Hepatitis A immunization.** / Consult your caregiver. 2 doses, 6 to 18 months apart.  Hepatitis B immunization.** /  Check with your caregiver. 3 doses, usually over 6 months. ** Family history and personal history of risk and conditions may change your caregiver's recommendations. Document Released: 01/24/2002 Document Revised: 02/20/2012 Document Reviewed: 04/25/2011 [facility] Patient Information 2013 [place], [place].

## 2013-03-25 ENCOUNTER — Telehealth: Payer: Self-pay | Admitting: Internal Medicine

## 2013-03-25 NOTE — Telephone Encounter (Signed)
Caller: Nicole Gonzalez/Patient; Phone: [phone]; Reason for Call: Found a Marble sized knot in abdomen this weekend, does she still need to go to [facility]?

## 2013-03-25 NOTE — Telephone Encounter (Signed)
Spoke to pt asked her where she found the knot? Pt stated right lower abdomen where she has been having the pain, can feel a marble sized knot there now. Told pt okay needs to come in to have evaluated and go from there. Told pt someone from scheduling will contact her for an appt. Pt verbalized understanding.

## 2013-03-26 ENCOUNTER — Ambulatory Visit (INDEPENDENT_AMBULATORY_CARE_PROVIDER_SITE_OTHER): Payer: BC Managed Care – PPO | Admitting: Internal Medicine

## 2013-03-26 ENCOUNTER — Encounter: Payer: Self-pay | Admitting: Internal Medicine

## 2013-03-26 DIAGNOSIS — R1900 Intra-abdominal and pelvic swelling, mass and lump, unspecified site: Secondary | ICD-10-CM | POA: Insufficient documentation

## 2013-03-26 DIAGNOSIS — G8929 Other chronic pain: Secondary | ICD-10-CM

## 2013-03-26 DIAGNOSIS — R1031 Right lower quadrant pain: Secondary | ICD-10-CM

## 2013-03-26 NOTE — Patient Instructions (Signed)
I don't feel a hernia today but it does seem suspicious that you could have abdominal wall causing your pain.   As to when to proceed with the GI consult and we will get an abdominal pelvic CT that sometimes picks up on a hernia that we cannot feel. Consider getting a surgery opinion also depending on symptoms and the CT scan.

## 2013-03-26 NOTE — Progress Notes (Signed)
Chief Complaint  Patient presents with  . Abdominal Pain    HPI: Patient comes in today for SDA for  problem evaluation. Was here last week see previous note. Is under evaluation for progressive right lower Q discomfort. Saw a gynecologist last week and was not felt to have a GYN cause of her pain. However over the weekend 2 days ago she was stretching had pain and thought she felt a marble-sized area that was mobile in the right lower abd and her husband felt it also however she hasn't felt to today. No nausea vomiting other change hasn't started the metformin yet  No fever.  ROS: See pertinent positives and negatives per HPI.  Past Medical History  Diagnosis Date  . Hyperlipidemia     [id number] 043 trx   . History of gestational diabetes     x2  . Anemia      better after hysterectomy  . Fracture of lower leg      fall 2011  . History of basal cell carcinoma excision   . DIABETES MELLITUS, GESTATIONAL, HX OF 06/02/2009    Qualifier: Diagnosis of  By: Fabian Sharp MD, Neta Mends   . Increased frequency of headaches 03/15/2012  . UNSPECIFIED TACHYCARDIA 09/01/2010    Qualifier: Diagnosis of  By: Abner Greenspan MD, Misty Stanley      Family History  Problem Relation Age of Onset  . Melanoma Father   . Hypertension Father   . Diabetes      family hx  . Hyperlipidemia      family hx  . Squamous cell carcinoma Father   . Arthritis Brother      Psoriatic    History   Social History  . Marital Status: Married    Spouse Name: N/A    Number of Children: N/A  . Years of Education: N/A   Occupational History  . Insurance underwriter    Social History Main Topics  . Smoking status: Never Smoker   . Smokeless tobacco: None  . Alcohol Use: None  . Drug Use: None  . Sexually Active: None   Other Topics Concern  . 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    Nonsmoker married    Outpatient  Encounter Prescriptions as of 03/26/2013  Medication Sig Dispense Refill  . fexofenadine (ALLEGRA) 180 MG tablet Take 180 mg by mouth daily.      Marland Kitchen lisinopril (PRINIVIL,ZESTRIL) 10 MG tablet 1 BY MOUTH ONCE DAILY FOR HIGH BLOOD PRESSURE  30 tablet  1  . montelukast (SINGULAIR) 10 MG tablet       . omeprazole (PRILOSEC) 20 MG capsule Take 20 mg by mouth daily.       . QNASL 80 MCG/ACT AERS       . simvastatin (ZOCOR) 40 MG tablet Take 40 mg by mouth at bedtime.      . metFORMIN (GLUCOPHAGE-XR) 500 MG 24 hr tablet Take 1 tablet (500 mg total) by mouth daily with breakfast.  90 tablet  1   No facility-administered encounter medications on file as of 03/26/2013.    EXAM:  BP 140/106  Pulse 89  Temp(Src) 98.1 F (36.7 C) (Oral)  Wt 216 lb (97.977 kg)  BMI 37.38 kg/m2  SpO2 97%  Body mass index is 37.38 kg/(m^2).  GENERAL: vitals reviewed and listed  above, alert, oriented, appears well hydrated and in no acute distress  Abdomen soft without organomegaly examined standing and lying I don't feel a lump but there is focal pain right lower c quadrant no guarding or rebound negative psoas sign MS: moves all extremities without noticeable focal  abnormality  PSYCH: pleasant and cooperative, no obvious depression or anxiety  ASSESSMENT AND PLAN:  Discussed the following assessment and plan:  Abdominal pain, chronic, right lower quadrant, right - Plan: CT Abdomen Pelvis W Contrast  Abdominal swelling, mass, or lump felt by patient - Plan: CT Abdomen Pelvis W Contrast For now we'll await the CT scan result if there is a hernia there we can proceed with the surgery consult instead of GI however if her CT scan is unrevealing would have her follow through with the GI appointment. Should send a copy of the CT report to her gynecologist.  Blood pressure a bit up today. Will follow -Patient advised to return or notify health care team  if symptoms worsen or persist or new concerns arise.  Patient  Instructions  I don't feel a hernia today but it does seem suspicious that you could have abdominal wall causing your pain.   As to when to proceed with the GI consult and we will get an abdominal pelvic CT that sometimes picks up on a hernia that we cannot feel. Consider getting a surgery opinion also depending on symptoms and the CT scan.   Neta Mends. Panosh M.D.

## 2013-03-26 NOTE — Telephone Encounter (Signed)
appt scheduled

## 2013-03-27 ENCOUNTER — Telehealth: Payer: Self-pay | Admitting: Internal Medicine

## 2013-03-27 NOTE — Telephone Encounter (Signed)
PT called to question the instructions for her CT scan. Does she need a drink, does she need to pick anything up? Please assist.

## 2013-03-27 NOTE — Telephone Encounter (Signed)
Spoke with pt she is aware she need to come by the office to pick up contrast

## 2013-03-29 ENCOUNTER — Ambulatory Visit (INDEPENDENT_AMBULATORY_CARE_PROVIDER_SITE_OTHER)
Admission: RE | Admit: 2013-03-29 | Discharge: 2013-03-29 | Disposition: A | Discharge status: Home / Self Care | Payer: BC Managed Care – PPO | Source: Ambulatory Visit | Attending: Internal Medicine | Admitting: Internal Medicine

## 2013-03-29 DIAGNOSIS — R1900 Intra-abdominal and pelvic swelling, mass and lump, unspecified site: Secondary | ICD-10-CM

## 2013-03-29 DIAGNOSIS — R1031 Right lower quadrant pain: Secondary | ICD-10-CM

## 2013-03-29 DIAGNOSIS — G8929 Other chronic pain: Secondary | ICD-10-CM

## 2013-03-29 MED ORDER — IOHEXOL 300 MG/ML  SOLN
100.0000 mL | Freq: Once | INTRAMUSCULAR | Status: AC | PRN
  Administered 2013-03-29: 100 mL via INTRAVENOUS

## 2013-04-01 ENCOUNTER — Telehealth: Payer: Self-pay | Admitting: Internal Medicine

## 2013-04-01 NOTE — Telephone Encounter (Signed)
Pt would like ct scan results from friday

## 2013-04-01 NOTE — Telephone Encounter (Signed)
Pt notified that results are not ready and will call her when they are available.

## 2013-04-02 ENCOUNTER — Encounter: Payer: Self-pay | Admitting: Internal Medicine

## 2013-04-03 ENCOUNTER — Other Ambulatory Visit: Payer: Self-pay | Admitting: Internal Medicine

## 2013-04-03 NOTE — Telephone Encounter (Signed)
Please document this in the adverse effect module  Not as an allergy but described the reaction  thanks

## 2013-04-05 ENCOUNTER — Other Ambulatory Visit: Payer: Self-pay | Admitting: Internal Medicine

## 2013-04-05 ENCOUNTER — Other Ambulatory Visit: Payer: Self-pay

## 2013-04-05 DIAGNOSIS — Z1231 Encounter for screening mammogram for malignant neoplasm of breast: Secondary | ICD-10-CM

## 2013-04-23 ENCOUNTER — Ambulatory Visit: Payer: BC Managed Care – PPO | Admitting: Internal Medicine

## 2013-04-24 ENCOUNTER — Encounter: Payer: Self-pay | Admitting: Internal Medicine

## 2013-04-25 ENCOUNTER — Encounter: Payer: Self-pay | Admitting: Internal Medicine

## 2013-04-25 ENCOUNTER — Ambulatory Visit (INDEPENDENT_AMBULATORY_CARE_PROVIDER_SITE_OTHER): Payer: BC Managed Care – PPO | Admitting: Internal Medicine

## 2013-04-25 VITALS — BP 108/78 | HR 84 | Ht 63.39 in | Wt 214.0 lb

## 2013-04-25 DIAGNOSIS — K625 Hemorrhage of anus and rectum: Secondary | ICD-10-CM

## 2013-04-25 DIAGNOSIS — Z90710 Acquired absence of both cervix and uterus: Secondary | ICD-10-CM

## 2013-04-25 DIAGNOSIS — R1031 Right lower quadrant pain: Secondary | ICD-10-CM

## 2013-04-25 MED ORDER — PEG-KCL-NACL-NASULF-NA ASC-C 100 G PO SOLR
1.0000 | Freq: Once | ORAL | Status: DC
Start: 2013-04-25 — End: 2013-05-03

## 2013-04-25 NOTE — Progress Notes (Signed)
Patient ID: Nicole Gonzalez, female   DOB: 1971/08/24, 42 y.o.   MRN: [id number] HPI: Nicole Gonzalez is a 42 year old female with a past medical history of hyperglycemia/type 2 diabetes not yet on medication, hypertension, hyperlipidemia, GERD, who is seen in consultation at the request of Dr. Fabian Sharp for evaluation of right lower quadrant abdominal pain. The patient has had ongoing issues with right lower quadrant abdominal pain since November 2011. She reports initially it was intermittent and mild. Over last several months it has become more of a constant type pain which she calls "annoying". She describes it as more of a disturbance than a true pain. It does seem to be worse with stretching or moving in certain ways. She does not relate it to eating or bowel movement. She's had no nausea or vomiting. She does have a history of heartburn but this is well-controlled on omeprazole 20 mg daily. Her bowel habits have been regular which for her is usually a daily formed stool. She denies issues with diarrhea or constipation. She's had no hematochezia or melena. She does occasionally see red blood with wiping. She denies fevers or chills. She reports this seems to have worsened or started several months after her robotic abdominal hysterectomy performed for uterine fibroids. Her ovaries were not removed. She was told by her gynecologist that this could be colon spasm or even scar tissue.  Patient Active Problem List   Diagnosis Date Noted  . Abdominal swelling, mass, or lump felt by patient 03/26/2013  . Leg length discrepancy 03/22/2013  . Newly diagnosed diabetes 03/22/2013  . Abdominal pain, chronic, right lower quadrant 03/22/2013  . History of basal cell carcinoma excision   . Visit for preventive health examination 03/15/2012  . Hx of abdominal pain 03/15/2012  . Anemia   . OBESITY 07/15/2010  . RHINITIS 07/15/2010  . HYPERGLYCEMIA 07/15/2010  . HYPERTENSION 06/02/2009  . DIABETES MELLITUS,  GESTATIONAL, HX OF 06/02/2009  . KNEE PAIN 06/03/2008  . HYPERLIPIDEMIA 10/09/2007  . GERD 10/09/2007  . HEMATURIA 10/09/2007  . HIDRADENITIS 10/09/2007    Past Surgical History  Procedure Laterality Date  . Tonsillectomy    . Abdominal hysterectomy  06/29/2010     question had abnormal cells on Path, fibroids  . Basal cell carcinoma excision      Current Outpatient Prescriptions  Medication Sig Dispense Refill  . fexofenadine (ALLEGRA) 180 MG tablet Take 180 mg by mouth daily.      Marland Kitchen lisinopril (PRINIVIL,ZESTRIL) 10 MG tablet TAKE 1 TABLET BY MOUTH DAILY FOR HIGH BLOOD PRESSURE  30 tablet  11  . montelukast (SINGULAIR) 10 MG tablet Take 10 mg by mouth daily.       Marland Kitchen omeprazole (PRILOSEC) 20 MG capsule Take 20 mg by mouth daily.       Illa Level 80 MCG/ACT AERS Place 2 sprays into the nose daily.       . simvastatin (ZOCOR) 40 MG tablet Take 40 mg by mouth at bedtime.      . metFORMIN (GLUCOPHAGE-XR) 500 MG 24 hr tablet Take 1 tablet (500 mg total) by mouth daily with breakfast.  90 tablet  1  . peg 3350 powder (MOVIPREP) 100 G SOLR Take 1 kit (100 g total) by mouth once.  1 kit  0   No current facility-administered medications for this visit.    Allergies  Allergen Reactions  . Butorphanol Tartrate   . Contrast Media (Iodinated Diagnostic Agents) Other (See Comments)    ORAL and IV  Fever and itchy mouth  . Other     Adhesive, Metals  . Stadol (Butorphanol)     Family History  Problem Relation Age of Onset  . Melanoma Father     x 3 times  . Hypertension Father   . Diabetes Maternal Grandmother   . Hyperlipidemia Father   . Squamous cell carcinoma Father   . Arthritis Brother      Psoriatic  . Pancreatic cancer Paternal Grandmother   . Colon polyps Father   . Hyperlipidemia Maternal Grandmother   . Heart disease Father   . Heart disease Paternal Grandfather   . Heart disease Maternal Grandmother     History  Substance Use Topics  . Smoking status: Never  Smoker   . Smokeless tobacco: Never Used  . Alcohol Use: No    ROS: As per history of present illness, otherwise negative  BP 108/78  Pulse 84  Ht 5' 3.39" (1.61 m)  Wt 214 lb (97.07 kg)  BMI 37.45 kg/m2 Constitutional: Well-developed and well-nourished. No distress. HEENT: Normocephalic and atraumatic. Oropharynx is clear and moist. No oropharyngeal exudate. Conjunctivae are normal.  No scleral icterus. Neck: Neck supple. Trachea midline. Cardiovascular: Normal rate, regular rhythm and intact distal pulses. No M/R/G Pulmonary/chest: Effort normal and breath sounds normal. No wheezing, rales or rhonchi. Abdominal: Soft, mild right lower quadrant abdominal tenderness without rebound or guarding, nondistended. Bowel sounds active throughout. There are no masses palpable. No hepatosplenomegaly. Abdominal striae present.  No other skin changes on the anterior abdominal wall Extremities: no clubbing, cyanosis, or edema Lymphadenopathy: No cervical adenopathy noted. Neurological: Alert and oriented to person place and time. Skin: Skin is warm and dry. No rashes noted. Psychiatric: Normal mood and affect. Behavior is normal.  RELEVANT LABS AND IMAGING: CBC    Component Value Date/Time   WBC 8.4 03/15/2013 0806   RBC 4.78 03/15/2013 0806   HGB 13.9 03/15/2013 0806   HCT 41.3 03/15/2013 0806   PLT 218.0 03/15/2013 0806   MCV 86.4 03/15/2013 0806   MCH 24.0* 07/04/2010 2035   MCHC 33.6 03/15/2013 0806   RDW 13.0 03/15/2013 0806   LYMPHSABS 2.1 03/15/2013 0806   MONOABS 0.5 03/15/2013 0806   EOSABS 0.1 03/15/2013 0806   BASOSABS 0.0 03/15/2013 0806    CMP     Component Value Date/Time   NA 136 03/15/2013 0806   K 4.6 03/15/2013 0806   CL 101 03/15/2013 0806   CO2 26 03/15/2013 0806   GLUCOSE 141* 03/15/2013 0806   GLUCOSE 89 09/29/2006 0947   BUN 12 03/15/2013 0806   CREATININE 0.6 03/15/2013 0806   CALCIUM 9.1 03/15/2013 0806   PROT 7.5 03/15/2013 0806   ALBUMIN 4.2 03/15/2013 0806   AST 24 03/15/2013 0806   ALT  28 03/15/2013 0806   ALKPHOS 66 03/15/2013 0806   BILITOT 0.9 03/15/2013 0806   GFRNONAA >60 06/18/2010 1145   GFRAA  Value: >60        The eGFR has been calculated using the MDRD equation. This calculation has not been validated in all clinical situations. eGFR's persistently <60 mL/min signify possible Chronic Kidney Disease. 06/18/2010 1145  CT ABDOMEN AND PELVIS WITH CONTRAST - 04/02/2013   Technique:  Multidetector CT imaging of the abdomen and pelvis was performed following the standard protocol during bolus administration of intravenous contrast.   Contrast: OMNIPAQUE IOHEXOL 300 MG/ML  SOLN   Comparison: None.   Findings: Mild hepatic steatosis is demonstrated but no liver masses  are identified.  Gallbladder is unremarkable.  The pancreas, spleen, adrenal glands, and kidneys are normal in appearance.  No evidence of hydronephrosis.   No soft tissue masses or lymphadenopathy identified.  Prior hysterectomy noted.  Adnexal regions are unremarkable in appearance.  Normal appendix is visualized.  No evidence of inflammatory process or abnormal fluid collections.  No evidence of bowel wall thickening, dilatation, or hernia. Mild diverticulosis is seen involving the descending colon, however there is no evidence of diverticulitis.  Pubic symphysis degenerative changes noted, but no suspicious bone lesions identified.   IMPRESSION:   1. Mild diverticulosis.  No radiographic evidence of diverticulitis or other acute findings. 2.  No mass or hernia identified. 3.  Mild hepatic steatosis.   ASSESSMENT/PLAN: 42 year old female with a past medical history of hyperglycemia/type 2 diabetes not yet on medication, hypertension, hyperlipidemia, GERD, who is seen in consultation at the request of Dr. Fabian Sharp for evaluation of right lower quadrant abdominal pain.  1.  RLQ abd pain -- at this point I'm not convinced that her pain is GI or colonic related.  Other than the pain there are no  alarm symptoms to speak of. It started a few months after her abdominal hysterectomy which raises the question of nerve injury, such as injury to the iliohypogastric nerve causing her pain. Scar tissue to inform after anterior abdominal incisions lead to nerve irritation/involvement.   Her CT scan was reviewed and revealed mild left colon diverticulosis without diverticulitis. No hernias were seen. Given the persistence of this pain and yet to be determined etiology, we will proceed to colonoscopy to ensure no right colonic pathology. If her colonoscopy is unremarkable, I recommend trial of gabapentin for nerve pain. Colonoscopy will also be helpful to evaluate her intermittent bright red blood per rectum associated with bowel movement.

## 2013-04-25 NOTE — Patient Instructions (Addendum)
You have been scheduled for a colonoscopy with propofol. Please follow written instructions given to you at your visit today.  Please pick up your prep kit at the pharmacy within the next 1-3 days. If you use inhalers (even only as needed), please bring them with you on the day of your procedure. Your physician has requested that you go to www.[email or web address] and enter the access code given to you at your visit today. This web site gives a general overview about your procedure. However, you should still follow specific instructions given to you by our office regarding your preparation for the procedure.  We have sent the following medications to your pharmacy for you to pick up at your convenience: Moviprep                                               We are excited to introduce MyChart, a new best-in-class service that provides you online access to important information in your electronic medical record. We want to make it easier for you to view your health information - all in one secure location - when and where you need it. We expect MyChart will enhance the quality of care and service we provide.  When you register for MyChart, you can:    View your test results.    Request appointments and receive appointment reminders via email.    Request medication renewals.    View your medical history, allergies, medications and immunizations.    Communicate with your physician's office through a password-protected site.    Conveniently print information such as your medication lists.  To find out if MyChart is right for you, please talk to a member of our clinical staff today. We will gladly answer your questions about this free health and wellness tool.  If you are age 18 or older and want a member of your family to have access to your record, you must provide written consent by completing a proxy form available at our office. Please speak to our clinical staff about guidelines regarding  accounts for patients younger than age 18.  As you activate your MyChart account and need any technical assistance, please call the MyChart technical support line at [phone] ([phone]) or email your question to [email or web address]. If you email your question(s), please include your name, a return phone number and the best time to reach you.  If you have non-urgent health-related questions, you can send a message to our office through MyChart at [email or web address]. If you have a medical emergency, call 911.  Thank you for using MyChart as your new health and wellness resource!   MyChart licensed from [employer],  1999-2010. Patents Pending.    

## 2013-04-29 ENCOUNTER — Telehealth: Payer: Self-pay | Admitting: Internal Medicine

## 2013-04-29 NOTE — Telephone Encounter (Signed)
According to [facility], CMS, pt's DOB was incorrect. Called [phone] who corrected the problem. Access Code for pt [id number]. Informed pt.

## 2013-04-30 ENCOUNTER — Ambulatory Visit: Payer: BC Managed Care – PPO

## 2013-05-03 ENCOUNTER — Encounter: Payer: Self-pay | Admitting: Internal Medicine

## 2013-05-03 ENCOUNTER — Other Ambulatory Visit: Payer: Self-pay | Admitting: Family Medicine

## 2013-05-03 ENCOUNTER — Ambulatory Visit (AMBULATORY_SURGERY_CENTER): Payer: BC Managed Care – PPO | Admitting: Internal Medicine

## 2013-05-03 VITALS — BP 102/67 | HR 65 | Temp 99.0°F | Resp 26 | Ht 63.0 in | Wt 214.0 lb

## 2013-05-03 DIAGNOSIS — D126 Benign neoplasm of colon, unspecified: Secondary | ICD-10-CM

## 2013-05-03 DIAGNOSIS — R1031 Right lower quadrant pain: Secondary | ICD-10-CM

## 2013-05-03 DIAGNOSIS — K625 Hemorrhage of anus and rectum: Secondary | ICD-10-CM

## 2013-05-03 MED ORDER — SIMVASTATIN 40 MG PO TABS: 40.0000 mg | ORAL_TABLET | Freq: Every day | ORAL | Status: DC

## 2013-05-03 MED ORDER — SODIUM CHLORIDE 0.9 % IV SOLN: 500.0000 mL | INTRAVENOUS | Status: DC

## 2013-05-03 NOTE — Progress Notes (Signed)
Called to room to assist during endoscopic procedure.  Patient ID and intended procedure confirmed with present staff. Received instructions for my participation in the procedure from the performing physician.  

## 2013-05-03 NOTE — Patient Instructions (Addendum)

## 2013-05-03 NOTE — Op Note (Signed)
[facility] [address] [place], [address]   COLONOSCOPY PROCEDURE REPORT  PATIENT: Nicole, Gonzalez  MR#: [id number] BIRTHDATE: May 21, 1971 , 42  yrs. old GENDER: Female ENDOSCOPIST: Beverley Fiedler, MD REFERRED XB:JYNWG Lonie Peak, M.D. PROCEDURE DATE:  05/03/2013 PROCEDURE:   Colonoscopy with snare polypectomy ASA CLASS:   Class II INDICATIONS:abdominal pain in the lower right quadrant. MEDICATIONS: MAC sedation, administered by CRNA and propofol (Diprivan) 300mg  IV  DESCRIPTION OF PROCEDURE:   After the risks benefits and alternatives of the procedure were thoroughly explained, informed consent was obtained.  A digital rectal exam revealed no rectal mass.   The LB [id number]  endoscope was introduced through the anus and advanced to the terminal ileum which was intubated for a short distance. No adverse events experienced.   The quality of the prep was good, using MoviPrep  The instrument was then slowly withdrawn as the colon was fully examined.   COLON FINDINGS: The mucosa appeared normal in the terminal ileum. Three sessile polyps ranging between 3-47mm in size were found in the sigmoid colon and rectum.  Polypectomy was performed using cold snare.  All resections were complete and all polyp tissue was completely retrieved.   There was mild scattered diverticulosis noted in the descending colon and sigmoid colon.   The colon mucosa was otherwise normal. Retroflexed views revealed small internal hemorrhoids. The time to cecum=3 minutes 31 seconds.  Withdrawal time=10 minutes 54 seconds.  The scope was withdrawn and the procedure completed. COMPLICATIONS: There were no complications.  ENDOSCOPIC IMPRESSION: 1.   Normal mucosa in the terminal ileum 2.   Three sessile polyps ranging between 3-35mm in size were found in the sigmoid colon and rectum; Polypectomy was performed using cold snare 3.   There was mild diverticulosis noted in the descending  colon and sigmoid colon 4.   The colon mucosa was otherwise normal 5.   Small internal hemorrhoids  RECOMMENDATIONS: 1.  Await pathology results 2.  High fiber diet 3.  Timing of repeat colonoscopy will be determined by pathology findings. 4.  You will receive a letter within 1-2 weeks with the results of your biopsy as well as final recommendations.  Please call my office if you have not received a letter after 3 weeks. 5.  If pain continues in right lower quadrant, consider trial of gabapentin   eSigned:  Beverley Fiedler, MD 05/03/2013 4:30 PM   cc: The Patient   PATIENT NAME:  Nicole, Gonzalez. MR#: [id number]

## 2013-05-03 NOTE — Progress Notes (Signed)
Patient did not experience any of the following events: a burn prior to discharge; a fall within the facility; wrong site/side/patient/procedure/implant event; or a hospital transfer or hospital admission upon discharge from the facility. (G8907) Patient did not have preoperative order for IV antibiotic SSI prophylaxis. (G8918)  

## 2013-05-07 ENCOUNTER — Telehealth: Payer: Self-pay | Admitting: *Deleted

## 2013-05-07 NOTE — Telephone Encounter (Signed)
Name identifier, left message, follow-up 

## 2013-05-09 ENCOUNTER — Encounter: Payer: Self-pay | Admitting: Internal Medicine

## 2013-05-21 ENCOUNTER — Ambulatory Visit
Admission: RE | Admit: 2013-05-21 | Discharge: 2013-05-21 | Disposition: A | Discharge status: Home / Self Care | Payer: BC Managed Care – PPO | Source: Ambulatory Visit

## 2013-05-21 ENCOUNTER — Telehealth: Payer: Self-pay | Admitting: Internal Medicine

## 2013-05-21 DIAGNOSIS — Z1231 Encounter for screening mammogram for malignant neoplasm of breast: Secondary | ICD-10-CM

## 2013-05-21 MED ORDER — SIMVASTATIN 40 MG PO TABS: 40.0000 mg | ORAL_TABLET | Freq: Every day | ORAL | Status: DC

## 2013-05-21 NOTE — Telephone Encounter (Signed)
Sent by e-scribe. 

## 2013-05-21 NOTE — Telephone Encounter (Signed)
Pt called to request a 7 day supply of her simvastatin (ZOCOR) 40 MG tablet [facility] in [place]. She's had problems getting her full subscription from [employer] and was instructed to request a week supply, to last her until that arrives. Please assist.

## 2013-06-13 ENCOUNTER — Other Ambulatory Visit (INDEPENDENT_AMBULATORY_CARE_PROVIDER_SITE_OTHER): Payer: BC Managed Care – PPO

## 2013-06-13 DIAGNOSIS — Z8632 Personal history of gestational diabetes: Secondary | ICD-10-CM

## 2013-06-13 LAB — HEMOGLOBIN A1C: Hgb A1c MFr Bld: 6.3 % (ref 4.6–6.5)

## 2013-06-21 ENCOUNTER — Encounter: Payer: Self-pay | Admitting: Internal Medicine

## 2013-06-21 ENCOUNTER — Ambulatory Visit (INDEPENDENT_AMBULATORY_CARE_PROVIDER_SITE_OTHER): Payer: BC Managed Care – PPO | Admitting: Internal Medicine

## 2013-06-21 VITALS — BP 124/84 | HR 88 | Temp 98.1°F | Wt 208.0 lb

## 2013-06-21 DIAGNOSIS — I10 Essential (primary) hypertension: Secondary | ICD-10-CM

## 2013-06-21 DIAGNOSIS — G8929 Other chronic pain: Secondary | ICD-10-CM

## 2013-06-21 DIAGNOSIS — R1031 Right lower quadrant pain: Secondary | ICD-10-CM

## 2013-06-21 DIAGNOSIS — E119 Type 2 diabetes mellitus without complications: Secondary | ICD-10-CM

## 2013-06-21 DIAGNOSIS — E785 Hyperlipidemia, unspecified: Secondary | ICD-10-CM

## 2013-06-21 DIAGNOSIS — Z23 Encounter for immunization: Secondary | ICD-10-CM

## 2013-06-21 NOTE — Progress Notes (Signed)
Chief Complaint  Patient presents with  . Follow-up    Lab work    HPI:  Fu nmew onsret dm numbers  RX metformin last visit and here for fu    Did dietary  intervention hasn't exercised a lot yet. Feels pretty well. No unusual infections vision changes  Thinks dominant old Pain from nerve damage from prev surgery had colonoscopy   Had flat polyps    To do in 5 years.   Colon.  A Comes and goes . ROS: See pertinent positives and negatives per HPI.  Past Medical History  Diagnosis Date  . Hyperlipidemia     [id number] 043 trx   . History of gestational diabetes     x2  . Anemia      better after hysterectomy  . Fracture of lower leg      fall 2011  . History of basal cell carcinoma excision   . DIABETES MELLITUS, GESTATIONAL, HX OF 06/02/2009    Qualifier: Diagnosis of  By: Fabian Sharp MD, Neta Mends   . Increased frequency of headaches 03/15/2012  . UNSPECIFIED TACHYCARDIA 09/01/2010    Qualifier: Diagnosis of  By: Abner Greenspan MD, Misty Stanley    . HTN (hypertension)   . Obesity   . GERD (gastroesophageal reflux disease)   . Seizures     as an infant d/t fever    Family History  Problem Relation Age of Onset  . Melanoma Father     x 3 times  . Hypertension Father   . Diabetes Maternal Grandmother   . Hyperlipidemia Father   . Squamous cell carcinoma Father   . Arthritis Brother      Psoriatic  . Pancreatic cancer Paternal Grandmother   . Colon polyps Father   . Hyperlipidemia Maternal Grandmother   . Heart disease Father   . Heart disease Paternal Grandfather   . Heart disease Maternal Grandmother     History   Social History  . Marital Status: Married    Spouse Name: N/A    Number of Children: 2  . Years of Education: N/A   Occupational History  . Insurance underwriter   .     Social History Main Topics  . Smoking status: Never Smoker   . Smokeless tobacco: Never Used  . Alcohol Use: No  . Drug Use: No  . Sexually Active: None   Other Topics Concern  . 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    Nonsmoker married    Outpatient Encounter Prescriptions as of 06/21/2013  Medication Sig Dispense Refill  . fexofenadine (ALLEGRA) 180 MG tablet Take 180 mg by mouth daily.      Marland Kitchen lisinopril (PRINIVIL,ZESTRIL) 10 MG tablet TAKE 1 TABLET BY MOUTH DAILY FOR HIGH BLOOD PRESSURE  30 tablet  11  . montelukast (SINGULAIR) 10 MG tablet Take 10 mg by mouth daily.       Marland Kitchen omeprazole (PRILOSEC) 20 MG capsule Take 20 mg by mouth daily.       Illa Level 80 MCG/ACT AERS Place 2 sprays into the nose daily.       . simvastatin (ZOCOR) 40 MG tablet Take 1 tablet (40 mg total) by mouth at bedtime.  7 tablet  0  . [DISCONTINUED] metFORMIN (GLUCOPHAGE-XR) 500 MG 24 hr tablet Take 1 tablet (500  mg total) by mouth daily with breakfast.  90 tablet  1   No facility-administered encounter medications on file as of 06/21/2013.    EXAM:  BP 124/84  Pulse 88  Temp(Src) 98.1 F (36.7 C) (Oral)  Wt 208 lb (94.348 kg)  BMI 36.85 kg/m2  SpO2 98%  Body mass index is 36.85 kg/(m^2). Wt Readings from Last 3 Encounters:  06/21/13 208 lb (94.348 kg)  05/03/13 214 lb (97.07 kg)  04/25/13 214 lb (97.07 kg)    GENERAL: vitals reviewed and listed above, alert, oriented, appears well hydrated and in no acute distress  PSYCH: pleasant and cooperative, no obvious depression or anxiety Lab Results  Component Value Date   HGBA1C 6.3 06/13/2013    ASSESSMENT AND PLAN:  Discussed the following assessment and plan:  Newly diagnosed diabetes - now in  pre dm range with  diet only   continue counseld recheck in november   HYPERTENSION  HYPERLIPIDEMIA  Abdominal pain, chronic, right lower quadrant - colon few polyps  2014  poss from scar tissue surgeries etc.   Need for prophylactic vaccination and inoculation against viral hepatitis - Plan: Hepatitis A hepatitis B combined vaccine  IM Update twin rix for now  Other issues to address as  Follow   Doing great with interventions   Not on metformin  -Patient advised to return or notify health care team  if symptoms worsen or persist or new concerns arise.  Patient Instructions  FU in November   Lab pre visit   Hg a1c and lipid panel.  Continue lifestyle intervention healthy eating and exercise . Get second hep a b today   consider pneumovax in future.     Neta Mends. Panosh M.D.

## 2013-06-21 NOTE — Patient Instructions (Addendum)
FU in November   Lab pre visit   Hg a1c and lipid panel.  Continue lifestyle intervention healthy eating and exercise . Get second hep a b today   consider pneumovax in future.

## 2013-07-10 ENCOUNTER — Telehealth: Payer: Self-pay | Admitting: Internal Medicine

## 2013-07-10 ENCOUNTER — Other Ambulatory Visit: Payer: Self-pay | Admitting: Family Medicine

## 2013-07-10 DIAGNOSIS — M79671 Pain in right foot: Secondary | ICD-10-CM

## 2013-07-10 NOTE — Telephone Encounter (Signed)
Ok to do a referral . Or she can make it on her own.  Podiatry or sports medicine.

## 2013-07-10 NOTE — Telephone Encounter (Signed)
Pt notified order placed in the system for rt arch pain in foot.

## 2013-07-10 NOTE — Telephone Encounter (Signed)
Pt called and stated that she would like to see a podiatrist. She states that as she has been walking consistently, she is having trouble with her arch. Please assist.

## 2013-09-20 ENCOUNTER — Ambulatory Visit: Payer: BC Managed Care – PPO | Admitting: Family Medicine

## 2013-09-25 ENCOUNTER — Ambulatory Visit (INDEPENDENT_AMBULATORY_CARE_PROVIDER_SITE_OTHER): Payer: BC Managed Care – PPO

## 2013-09-25 DIAGNOSIS — Z23 Encounter for immunization: Secondary | ICD-10-CM

## 2013-10-15 ENCOUNTER — Other Ambulatory Visit (INDEPENDENT_AMBULATORY_CARE_PROVIDER_SITE_OTHER): Payer: BC Managed Care – PPO

## 2013-10-15 DIAGNOSIS — E785 Hyperlipidemia, unspecified: Secondary | ICD-10-CM

## 2013-10-15 DIAGNOSIS — R7309 Other abnormal glucose: Secondary | ICD-10-CM

## 2013-10-15 LAB — LIPID PANEL
Cholesterol: 140 mg/dL (ref 0–200)
Triglycerides: 41 mg/dL (ref 0.0–149.0)

## 2013-10-22 ENCOUNTER — Encounter: Payer: Self-pay | Admitting: Internal Medicine

## 2013-10-22 ENCOUNTER — Ambulatory Visit (INDEPENDENT_AMBULATORY_CARE_PROVIDER_SITE_OTHER): Payer: BC Managed Care – PPO | Admitting: Internal Medicine

## 2013-10-22 VITALS — BP 116/84 | HR 89 | Temp 98.0°F | Wt 198.0 lb

## 2013-10-22 DIAGNOSIS — M722 Plantar fascial fibromatosis: Secondary | ICD-10-CM

## 2013-10-22 DIAGNOSIS — E785 Hyperlipidemia, unspecified: Secondary | ICD-10-CM

## 2013-10-22 DIAGNOSIS — E119 Type 2 diabetes mellitus without complications: Secondary | ICD-10-CM

## 2013-10-22 NOTE — Progress Notes (Signed)
Chief Complaint  Patient presents with  . Follow-up    HPI: Patient comes in today for follow up of  multiple medical problems.  Walking  5 x poer week  Foot an issues.  PF seeing podiatrist  Has inserts  counting  carbs  Feeling pretty good .  Losing weight strategies  antiinflamm for wrist .  Per dr Charlett Blake has ?  ROS: See pertinent positives and negatives per HPI.  Past Medical History  Diagnosis Date  . Hyperlipidemia     [id number] 043 trx   . History of gestational diabetes     x2  . Anemia      better after hysterectomy  . Fracture of lower leg      fall 2011  . History of basal cell carcinoma excision   . DIABETES MELLITUS, GESTATIONAL, HX OF 06/02/2009    Qualifier: Diagnosis of  By: Fabian Sharp MD, Neta Mends   . Increased frequency of headaches 03/15/2012  . UNSPECIFIED TACHYCARDIA 09/01/2010    Qualifier: Diagnosis of  By: Abner Greenspan MD, Misty Stanley    . HTN (hypertension)   . Obesity   . GERD (gastroesophageal reflux disease)   . Seizures     as an infant d/t fever    Family History  Problem Relation Age of Onset  . Melanoma Father     x 3 times  . Hypertension Father   . Diabetes Maternal Grandmother   . Hyperlipidemia Father   . Squamous cell carcinoma Father   . Arthritis Brother      Psoriatic  . Pancreatic cancer Paternal Grandmother   . Colon polyps Father   . Hyperlipidemia Maternal Grandmother   . Heart disease Father   . Heart disease Paternal Grandfather   . Heart disease Maternal Grandmother     History   Social History  . Marital Status: Married    Spouse Name: N/A    Number of Children: 2  . Years of Education: N/A   Occupational History  . Insurance underwriter   .     Social History Main Topics  . Smoking status: Never Smoker   . Smokeless tobacco: Never Used  . Alcohol Use: No  . Drug Use: No  . Sexual Activity: None   Other Topics Concern  . 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    Nonsmoker married    Outpatient Encounter Prescriptions as of 10/22/2013  Medication Sig  . fexofenadine (ALLEGRA) 180 MG tablet Take 180 mg by mouth daily.  Marland Kitchen lisinopril (PRINIVIL,ZESTRIL) 10 MG tablet TAKE 1 TABLET BY MOUTH DAILY FOR HIGH BLOOD PRESSURE  . montelukast (SINGULAIR) 10 MG tablet Take 10 mg by mouth daily.   Marland Kitchen omeprazole (PRILOSEC) 20 MG capsule Take 20 mg by mouth daily.   Illa Level 80 MCG/ACT AERS Place 2 sprays into the nose daily.   . simvastatin (ZOCOR) 40 MG tablet Take 1 tablet (40 mg total) by mouth at bedtime.  . meloxicam (MOBIC) 15 MG tablet     EXAM:  BP 116/84  Pulse 89  Temp(Src) 98 F (36.7 C) (Oral)  Wt 198 lb (89.812 kg)  SpO2 97%  Body mass index is 35.08 kg/(m^2).  GENERAL: vitals reviewed and listed above, alert, oriented, appears well hydrated and in no acute distress  MS: moves all  extremities without noticeable focal  abnormality  PSYCH: pleasant and cooperative, no obvious depression or anxiety Lab Results  Component Value Date   WBC 8.4 03/15/2013   HGB 13.9 03/15/2013   HCT 41.3 03/15/2013   PLT 218.0 03/15/2013   GLUCOSE 141* 03/15/2013   CHOL 140 10/15/2013   TRIG 41.0 10/15/2013   HDL 47.80 10/15/2013   LDLDIRECT 172.4 10/02/2007   LDLCALC 84 10/15/2013   ALT 28 03/15/2013   AST 24 03/15/2013   NA 136 03/15/2013   K 4.6 03/15/2013   CL 101 03/15/2013   CREATININE 0.6 03/15/2013   BUN 12 03/15/2013   CO2 26 03/15/2013   TSH 2.05 03/15/2013   HGBA1C 6.0 10/15/2013   Wt Readings from Last 3 Encounters:  10/22/13 198 lb (89.812 kg)  06/21/13 208 lb (94.348 kg)  05/03/13 214 lb (97.07 kg)    ASSESSMENT AND PLAN:  Discussed the following assessment and plan:  Newly diagnosed diabetes - doing very well.  continue 16# weight loss   HYPERLIPIDEMIA  Plantar fasciitis - seeing  podaitrist .  Disc pna vaccine  return for prevnar 13  -Patient advised to return or notify health care team  if symptoms  worsen or persist or new concerns arise.  Patient Instructions  Continue lifestyle intervention healthy eating and exercise . cpx in [place] with labs and hg a1c .   Advise the  prevnar vaccine.   Ok o take  meloxicam short term  With prilosec for gi protection.   Neta Mends. Evaan Tidwell M.D.

## 2013-10-22 NOTE — Patient Instructions (Signed)
Continue lifestyle intervention healthy eating and exercise . cpx in [place] with labs and hg a1c .   Advise the  prevnar vaccine.   Ok o take  meloxicam short term  With prilosec for gi protection.

## 2013-10-23 ENCOUNTER — Ambulatory Visit (INDEPENDENT_AMBULATORY_CARE_PROVIDER_SITE_OTHER): Payer: BC Managed Care – PPO | Admitting: Family Medicine

## 2013-10-23 DIAGNOSIS — Z23 Encounter for immunization: Secondary | ICD-10-CM

## 2014-02-19 ENCOUNTER — Telehealth: Payer: Self-pay | Admitting: Internal Medicine

## 2014-02-19 ENCOUNTER — Encounter: Payer: Self-pay | Admitting: Family

## 2014-02-19 ENCOUNTER — Ambulatory Visit (INDEPENDENT_AMBULATORY_CARE_PROVIDER_SITE_OTHER): Payer: BC Managed Care – PPO | Admitting: Family

## 2014-02-19 VITALS — BP 110/72 | Temp 98.2°F | Wt 200.0 lb

## 2014-02-19 DIAGNOSIS — H8110 Benign paroxysmal vertigo, unspecified ear: Secondary | ICD-10-CM

## 2014-02-19 DIAGNOSIS — I10 Essential (primary) hypertension: Secondary | ICD-10-CM

## 2014-02-19 DIAGNOSIS — E119 Type 2 diabetes mellitus without complications: Secondary | ICD-10-CM

## 2014-02-19 DIAGNOSIS — J309 Allergic rhinitis, unspecified: Secondary | ICD-10-CM

## 2014-02-19 MED ORDER — MECLIZINE HCL 50 MG PO TABS: 50.0000 mg | ORAL_TABLET | Freq: Three times a day (TID) | ORAL | Status: DC | PRN

## 2014-02-19 NOTE — Patient Instructions (Signed)
Vertigo Vertigo means you feel like you or your surroundings are moving when they are not. Vertigo can be dangerous if it occurs when you are at work, driving, or performing difficult activities.  CAUSES  Vertigo occurs when there is a conflict of signals sent to your brain from the visual and sensory systems in your body. There are many different causes of vertigo, including:  Infections, especially in the inner ear.  A bad reaction to a drug or misuse of alcohol and medicines.  Withdrawal from drugs or alcohol.  Rapidly changing positions, such as lying down or rolling over in bed.  A migraine headache.  Decreased blood flow to the brain.  Increased pressure in the brain from a head injury, infection, tumor, or bleeding. SYMPTOMS  You may feel as though the world is spinning around or you are falling to the ground. Because your balance is upset, vertigo can cause nausea and vomiting. You may have involuntary eye movements (nystagmus). DIAGNOSIS  Vertigo is usually diagnosed by physical exam. If the cause of your vertigo is unknown, your caregiver may perform imaging tests, such as an MRI scan (magnetic resonance imaging). TREATMENT  Most cases of vertigo resolve on their own, without treatment. Depending on the cause, your caregiver may prescribe certain medicines. If your vertigo is related to body position issues, your caregiver may recommend movements or procedures to correct the problem. In rare cases, if your vertigo is caused by certain inner ear problems, you may need surgery. HOME CARE INSTRUCTIONS   Follow your caregiver's instructions.  Avoid driving.  Avoid operating heavy machinery.  Avoid performing any tasks that would be dangerous to you or others during a vertigo episode.  Tell your caregiver if you notice that certain medicines seem to be causing your vertigo. Some of the medicines used to treat vertigo episodes can actually make them worse in some people. SEEK  IMMEDIATE MEDICAL CARE IF:   Your medicines do not relieve your vertigo or are making it worse.  You develop problems with talking, walking, weakness, or using your arms, hands, or legs.  You develop severe headaches.  Your nausea or vomiting continues or gets worse.  You develop visual changes.  A family member notices behavioral changes.  Your condition gets worse. MAKE SURE YOU:  Understand these instructions.  Will watch your condition.  Will get help right away if you are not doing well or get worse. Document Released: 09/07/2005 Document Revised: 02/20/2012 Document Reviewed: 06/16/2011 Exit[employer] Patient Information 2014 [employer].  

## 2014-02-19 NOTE — Telephone Encounter (Signed)
Relevant patient education assigned to patient using Emmi. ° °

## 2014-02-19 NOTE — Progress Notes (Signed)
Subjective:    Patient ID: Nicole Gonzalez, female    DOB: [birth year], 43 y.o.   MRN: [id number]  HPI  43 year old white female, patient of Dr. Regis Bill is in today with complaints of vertigo that began Monday and if gradually improved after taking a decongestant. She has a history of type 2 diabetes, allergic rhinitis, and hypertension. Reports having one episode of dizziness on Tuesday and then again today. Denies any cough or congestion.  Review of Systems  Constitutional: Negative.   HENT: Negative.   Respiratory: Negative.   Cardiovascular: Negative.   Gastrointestinal: Negative.   Endocrine: Negative.   Musculoskeletal: Negative.   Skin: Negative.   Neurological: Positive for dizziness. Negative for syncope, weakness and light-headedness.  Hematological: Negative.   Psychiatric/Behavioral: Negative.    Past Medical History  Diagnosis Date  . Hyperlipidemia     [id number] 043 trx   . History of gestational diabetes     x2  . Anemia      better after hysterectomy  . Fracture of lower leg      fall 2011  . History of basal cell carcinoma excision   . DIABETES MELLITUS, GESTATIONAL, HX OF 06/02/2009    Qualifier: Diagnosis of  By: Regis Bill MD, Standley Brooking   . Increased frequency of headaches 03/15/2012  . UNSPECIFIED TACHYCARDIA 09/01/2010    Qualifier: Diagnosis of  By: Charlett Blake MD, Erline Levine    . HTN (hypertension)   . Obesity   . GERD (gastroesophageal reflux disease)   . Seizures     as an infant d/t fever    History   Social History  . Marital Status: Married    Spouse Name: N/A    Number of Children: 2  . Years of Education: N/A   Occupational History  . Agricultural consultant   .     Social History Main Topics  . Smoking status: Never Smoker   . Smokeless tobacco: Never Used  . Alcohol Use: No  . Drug Use: No  . Sexual Activity: Not on file   Other Topics Concern  . 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    Nonsmoker married    Past Surgical History  Procedure Laterality Date  . Tonsillectomy    . Abdominal hysterectomy  06/29/2010     question had abnormal cells on Path, fibroids  . Basal cell carcinoma excision      Family History  Problem Relation Age of Onset  . Melanoma Father     x 3 times  . Hypertension Father   . Diabetes Maternal Grandmother   . Hyperlipidemia Father   . Squamous cell carcinoma Father   . Arthritis Brother      Psoriatic  . Pancreatic cancer Paternal Grandmother   . Colon polyps Father   . Hyperlipidemia Maternal Grandmother   . Heart disease Father   . Heart disease Paternal Grandfather   . Heart disease Maternal Grandmother     Allergies  Allergen Reactions  . Contrast Media [Iodinated Diagnostic Agents] Other (See Comments)    ORAL and IV Fever and itchy mouth  . Other     Adhesive, Metals  . Stadol [Butorphanol]     Current Outpatient Prescriptions on File Prior to Visit  Medication Sig Dispense Refill  . fexofenadine (  ALLEGRA) 180 MG tablet Take 180 mg by mouth daily.      Marland Kitchen lisinopril (PRINIVIL,ZESTRIL) 10 MG tablet TAKE 1 TABLET BY MOUTH DAILY FOR HIGH BLOOD PRESSURE  30 tablet  11  . montelukast (SINGULAIR) 10 MG tablet Take 10 mg by mouth daily.       Marland Kitchen omeprazole (PRILOSEC) 20 MG capsule Take 20 mg by mouth daily.       Norvel Richards 80 MCG/ACT AERS Place 2 sprays into the nose daily.       . simvastatin (ZOCOR) 40 MG tablet Take 1 tablet (40 mg total) by mouth at bedtime.  7 tablet  0  . meloxicam (MOBIC) 15 MG tablet        No current facility-administered medications on file prior to visit.    BP 110/72  Temp(Src) 98.2 F (36.8 C) (Oral)  Wt 200 lb (90.719 kg)chart    Objective:   Physical Exam  Constitutional: She is oriented to person, place, and time. She appears well-developed and well-nourished.  HENT:  Right Ear: External ear normal.  Left Ear: External ear  normal.  Nose: Nose normal.  Mouth/Throat: Oropharynx is clear and moist.  Neck: Normal range of motion. Neck supple.  Cardiovascular: Normal rate, regular rhythm and normal heart sounds.   Pulmonary/Chest: Effort normal and breath sounds normal.  Musculoskeletal: Normal range of motion.  Neurological: She is alert and oriented to person, place, and time.  Skin: Skin is warm and dry.  Psychiatric: She has a normal mood and affect.          Assessment & Plan:  [place] was seen today for dizziness.  Diagnoses and associated orders for this visit:  Benign paroxysmal positional vertigo  Unspecified essential hypertension  Type II or unspecified type diabetes mellitus without mention of complication, not stated as uncontrolled  Other Orders - meclizine (ANTIVERT) 50 MG tablet; Take 1 tablet (50 mg total) by mouth 3 (three) times daily as needed.   Call the office with any questions or concerns. Recheck as scheduled, and as needed.

## 2014-02-19 NOTE — Progress Notes (Signed)
Pre visit review using our clinic review tool, if applicable. No additional management support is needed unless otherwise documented below in the visit note. 

## 2014-02-25 ENCOUNTER — Telehealth: Payer: Self-pay

## 2014-02-25 NOTE — Telephone Encounter (Signed)
Relevant patient education assigned to patient using Emmi. ° °

## 2014-03-18 ENCOUNTER — Other Ambulatory Visit (INDEPENDENT_AMBULATORY_CARE_PROVIDER_SITE_OTHER): Payer: BC Managed Care – PPO

## 2014-03-18 DIAGNOSIS — Z0000 Encounter for general adult medical examination without abnormal findings: Secondary | ICD-10-CM

## 2014-03-18 LAB — BASIC METABOLIC PANEL
BUN: 19 mg/dL (ref 6–23)
CALCIUM: 9.2 mg/dL (ref 8.4–10.5)
CO2: 26 mEq/L (ref 19–32)
CREATININE: 0.6 mg/dL (ref 0.4–1.2)
Chloride: 107 mEq/L (ref 96–112)
GFR: 122.45 mL/min (ref >60.00)
Glucose, Bld: 109 mg/dL — ABNORMAL HIGH (ref 70–99)
Potassium: 4.4 mEq/L (ref 3.5–5.1)
SODIUM: 141 meq/L (ref 135–145)

## 2014-03-18 LAB — CBC WITH DIFFERENTIAL/PLATELET
BASOS PCT: 0.4 % (ref 0.0–3.0)
Basophils Absolute: 0 10*3/uL (ref 0.0–0.1)
EOS ABS: 0.1 10*3/uL (ref 0.0–0.7)
Eosinophils Relative: 1.9 % (ref 0.0–5.0)
HCT: 40.7 % (ref 36.0–46.0)
HEMOGLOBIN: 13.7 g/dL (ref 12.0–15.0)
LYMPHS PCT: 29.3 % (ref 12.0–46.0)
Lymphs Abs: 1.7 10*3/uL (ref 0.7–4.0)
MCHC: 33.7 g/dL (ref 30.0–36.0)
MCV: 87.1 fl (ref 78.0–100.0)
MONOS PCT: 5.5 % (ref 3.0–12.0)
Monocytes Absolute: 0.3 10*3/uL (ref 0.1–1.0)
NEUTROS ABS: 3.5 10*3/uL (ref 1.4–7.7)
NEUTROS PCT: 62.9 % (ref 43.0–77.0)
Platelets: 198 10*3/uL (ref 150.0–400.0)
RBC: 4.67 Mil/uL (ref 3.87–5.11)
RDW: 12.9 % (ref 11.5–14.6)
WBC: 5.6 10*3/uL (ref 4.5–10.5)

## 2014-03-18 LAB — LIPID PANEL
CHOL/HDL RATIO: 3
CHOLESTEROL: 144 mg/dL (ref 0–200)
HDL: 46.2 mg/dL (ref >39.00)
LDL CALC: 88 mg/dL (ref 0–99)
TRIGLYCERIDES: 50 mg/dL (ref 0.0–149.0)
VLDL: 10 mg/dL (ref 0.0–40.0)

## 2014-03-18 LAB — HEPATIC FUNCTION PANEL
ALBUMIN: 4.1 g/dL (ref 3.5–5.2)
ALK PHOS: 52 U/L (ref 39–117)
ALT: 25 U/L (ref 0–35)
AST: 17 U/L (ref 0–37)
BILIRUBIN DIRECT: 0 mg/dL (ref 0.0–0.3)
TOTAL PROTEIN: 7 g/dL (ref 6.0–8.3)
Total Bilirubin: 1 mg/dL (ref 0.3–1.2)

## 2014-03-18 LAB — TSH: TSH: 1.76 u[IU]/mL (ref 0.35–5.50)

## 2014-03-18 LAB — HEMOGLOBIN A1C: Hgb A1c MFr Bld: 6 % (ref 4.6–6.5)

## 2014-03-25 ENCOUNTER — Ambulatory Visit (INDEPENDENT_AMBULATORY_CARE_PROVIDER_SITE_OTHER): Payer: BC Managed Care – PPO | Admitting: Internal Medicine

## 2014-03-25 ENCOUNTER — Encounter: Payer: Self-pay | Admitting: Internal Medicine

## 2014-03-25 VITALS — BP 110/80 | HR 94 | Temp 99.2°F | Ht 63.25 in | Wt 198.0 lb

## 2014-03-25 DIAGNOSIS — E119 Type 2 diabetes mellitus without complications: Secondary | ICD-10-CM

## 2014-03-25 DIAGNOSIS — I10 Essential (primary) hypertension: Secondary | ICD-10-CM

## 2014-03-25 DIAGNOSIS — Z0000 Encounter for general adult medical examination without abnormal findings: Secondary | ICD-10-CM

## 2014-03-25 DIAGNOSIS — R1031 Right lower quadrant pain: Secondary | ICD-10-CM

## 2014-03-25 DIAGNOSIS — Z8632 Personal history of gestational diabetes: Secondary | ICD-10-CM

## 2014-03-25 DIAGNOSIS — E785 Hyperlipidemia, unspecified: Secondary | ICD-10-CM

## 2014-03-25 DIAGNOSIS — Z23 Encounter for immunization: Secondary | ICD-10-CM

## 2014-03-25 DIAGNOSIS — G8929 Other chronic pain: Secondary | ICD-10-CM

## 2014-03-25 DIAGNOSIS — H9209 Otalgia, unspecified ear: Secondary | ICD-10-CM

## 2014-03-25 NOTE — Progress Notes (Signed)
Pre visit review using our clinic review tool, if applicable. No additional management support is needed unless otherwise documented below in the visit note.   Chief Complaint  Patient presents with  . Annual Exam    no pap    HPI: Patient comes in today for Preventive Health Care visit  No major changes in health DM pre diabetes:Laying off potatoes.   Some carbs some biking 5 x per week.  Sleep  More than used to .  BP controlled low dose acei Gi: ok on omeprezole ocass miss.  Allergies per  Allergist controlled ok  LIPIDS no se of meds righttear pain off and on some totouch some down jaw no fever hearing ok Wt Readings from Last 3 Encounters:  03/25/14 198 lb (89.812 kg)  02/19/14 200 lb (90.719 kg)  10/22/13 198 lb (89.812 kg)    Health Maintenance  Topic Date Due  . Influenza Vaccine  07/12/2014  . Pap Smear  03/26/2016  . Colonoscopy  05/03/2018  . Tetanus/tdap  11/18/2018   Health Maintenance Review   ROS:  GEN/ HEENT: No fever, significant weight changes sweats headaches vision problems hearing changes, CV/ PULM; No chest pain shortness of breath cough, syncope,edema  change in exercise tolerance. GI /GU: No adominal pain, vomiting, change in bowel habits. No blood in the stool. No significant GU symptoms. SKIN/HEME: ,no acute skin rashes suspicious lesions or bleeding. No lymphadenopathy, nodules, masses.  NEURO/ PSYCH:  No neurologic signs such as weakness numbness. No depression anxiety. IMM/ Allergy: No unusual infections.  Allergy .   REST of 12 system review negative except as per HPI   Past Medical History  Diagnosis Date  . Hyperlipidemia     [id number] trx   . History of gestational diabetes     x2  . Anemia      better after hysterectomy  . Fracture of lower leg      fall 2011  . History of basal cell carcinoma excision   . DIABETES MELLITUS, GESTATIONAL, HX OF 06/02/2009    Qualifier: Diagnosis of  By: Regis Bill MD, Standley Brooking   . Increased  frequency of headaches 03/15/2012  . UNSPECIFIED TACHYCARDIA 09/01/2010    Qualifier: Diagnosis of  By: Charlett Blake MD, Erline Levine    . HTN (hypertension)   . Obesity   . GERD (gastroesophageal reflux disease)   . Seizures     as an infant d/t fever    Family History  Problem Relation Age of Onset  . Melanoma Father     x 3 times  . Hypertension Father   . Diabetes Maternal Grandmother   . Hyperlipidemia Father   . Squamous cell carcinoma Father   . Arthritis Brother      Psoriatic  . Pancreatic cancer Paternal Grandmother   . Colon polyps Father   . Hyperlipidemia Maternal Grandmother   . Heart disease Father   . Heart disease Paternal Grandfather   . Heart disease Maternal Grandmother     History   Social History  . Marital Status: Married    Spouse Name: N/A    Number of Children: 2  . Years of Education: N/A   Occupational History  . Agricultural consultant   .     Social History Main Topics  . Smoking status: Never Smoker   . Smokeless tobacco: Never Used  . Alcohol Use: No  . Drug Use: No  . Sexual Activity: None   Other Topics Concern  . 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    Nonsmoker married    Outpatient Encounter Prescriptions as of 03/25/2014  Medication Sig  . fexofenadine (ALLEGRA) 180 MG tablet Take 180 mg by mouth daily.  Marland Kitchen lisinopril (PRINIVIL,ZESTRIL) 10 MG tablet TAKE 1 TABLET BY MOUTH DAILY FOR HIGH BLOOD PRESSURE  . montelukast (SINGULAIR) 10 MG tablet Take 10 mg by mouth daily.   Marland Kitchen omeprazole (PRILOSEC) 20 MG capsule Take 20 mg by mouth daily.   Norvel Richards 80 MCG/ACT AERS Place 2 sprays into the nose daily.   . simvastatin (ZOCOR) 40 MG tablet Take 1 tablet (40 mg total) by mouth at bedtime.  . [DISCONTINUED] meclizine (ANTIVERT) 50 MG tablet Take 1 tablet (50 mg total) by mouth 3 (three) times daily as needed.  . [DISCONTINUED] meloxicam (MOBIC)  15 MG tablet     EXAM:  BP 110/80  Pulse 94  Temp(Src) 99.2 F (37.3 C) (Oral)  Ht 5' 3.25" (1.607 m)  Wt 198 lb (89.812 kg)  BMI 34.78 kg/m2  Body mass index is 34.78 kg/(m^2).  Physical Exam: Vital signs reviewed HCW:CBJS is a well-developed well-nourished alert cooperative    who appearsr stated age in no acute distress.  HEENT: normocephalic atraumatic , Eyes: PERRL EOM's full, conjunctiva clear, Nares: paten,t no deformity discharge or tenderness., Ears: no deformity EAC's clear  minimal wax TMs with normal landmarks. Mouth: clear OP, no lesions, edema.  Moist mucous membranes. Dentition in adequate repair. Right tmj some discomfort no click  NECK: supple without masses, thyromegaly or bruits. CHEST/PULM:  Clear to auscultation and percussion breath sounds equal no wheeze , rales or rhonchi. No chest wall deformities or tenderness. CV: PMI is nondisplaced, S1 S2 no gallops, murmurs, rubs. Peripheral pulses are full without delay.No JVD .  ABDOMEN: Bowel sounds normal nontender  No guard or rebound, no hepato splenomegal no CVA tenderness.  Extremtities:  No clubbing cyanosis or edema, no acute joint swelling or redness no focal atrophy NEURO:  Oriented x3, cranial nerves 3-12 appear to be intact, no obvious focal weakness,gait within normal limits no abnormal reflexes or asymmetrical SKIN: No acute rashes normal turgor, color, no bruising or petechiae. PSYCH: Oriented, good eye contact, no obvious depression anxiety, cognition and judgment appear normal. LN: no cervical axillary inguinal adenopathy  Lab Results  Component Value Date   WBC 5.6 03/18/2014   HGB 13.7 03/18/2014   HCT 40.7 03/18/2014   PLT 198.0 03/18/2014   GLUCOSE 109* 03/18/2014   CHOL 144 03/18/2014   TRIG 50.0 03/18/2014   HDL 46.20 03/18/2014   LDLDIRECT 172.4 10/02/2007   LDLCALC 88 03/18/2014   ALT 25 03/18/2014   AST 17 03/18/2014   NA 141 03/18/2014   K 4.4 03/18/2014   CL 107 03/18/2014   CREATININE 0.6 03/18/2014   BUN  19 03/18/2014   CO2 26 03/18/2014   TSH 1.76 03/18/2014   HGBA1C 6.0 03/18/2014    ASSESSMENT AND PLAN:  Discussed the following assessment and plan:  Visit for preventive health examination - utd twin rix today   Hx of gestational diabetes mellitus, not currently pregnant  Type II or unspecified type diabetes mellitus without mention of complication, not stated as uncontrolled - now is prediabetic range  contiuing lsi   . had declined metformin for now  Unspecified  essential hypertension  Need for prophylactic vaccination and inoculation against viral hepatitis - Plan: Hepatitis A hepatitis B combined vaccine IM  Ear discomfort - poss from tmj  HYPERLIPIDEMIA  Abdominal pain, chronic, right lower quadrant  Patient Care Team: Burnis Medin, MD as PCP - General gandhi Elveria Royals, MD as Attending Physician (Obstetrics and Gynecology) Tiajuana Amass, MD (Allergy) Patient Instructions  Continue lifestyle intervention healthy eating and exercise . weight loss can help blood sugar from progressing .  Last twinrix today . No change in meds . Can consider metformin  If wish or if sugars rise Plan hga1c inabout 6 months and rov   Consider jaw joint as cause of some of your discomfort right ear . Have your dentist assess . Recheck if ear more problematic but no infection today.    Exercise to Lose Weight Exercise and a healthy diet may help you lose weight. Your doctor may suggest specific exercises. EXERCISE IDEAS AND TIPS  Choose low-cost things you enjoy doing, such as walking, bicycling, or exercising to workout videos.  Take stairs instead of the elevator.  Walk during your lunch break.  Park your car further away from work or school.  Go to a gym or an exercise class.  Start with 5 to 10 minutes of exercise each day. Build up to 30 minutes of exercise 4 to 6 days a week.  Wear shoes with good support and comfortable clothes.  Stretch before and after working  out.  Work out until you breathe harder and your heart beats faster.  Drink extra water when you exercise.  Do not do so much that you hurt yourself, feel dizzy, or get very short of breath. Exercises that burn about 150 calories:  Running 1  miles in 15 minutes.  Playing volleyball for 45 to 60 minutes.  Washing and waxing a car for 45 to 60 minutes.  Playing touch football for 45 minutes.  Walking 1  miles in 35 minutes.  Pushing a stroller 1  miles in 30 minutes.  Playing basketball for 30 minutes.  Raking leaves for 30 minutes.  Bicycling 5 miles in 30 minutes.  Walking 2 miles in 30 minutes.  Dancing for 30 minutes.  Shoveling snow for 15 minutes.  Swimming laps for 20 minutes.  Walking up stairs for 15 minutes.  Bicycling 4 miles in 15 minutes.  Gardening for 30 to 45 minutes.  Jumping rope for 15 minutes.  Washing windows or floors for 45 to 60 minutes. Document Released: 12/31/2010 Document Revised: 02/20/2012 Document Reviewed: 12/31/2010 [facility] Patient Information 2014 [place], [place].     Standley Brooking. Panosh M.D.

## 2014-03-25 NOTE — Assessment & Plan Note (Signed)
now is prediabetic range  contiuing lsi   . had declined metformin for now will follow  Lab Results  Component Value Date   HGBA1C 6.0 03/18/2014

## 2014-03-25 NOTE — Assessment & Plan Note (Signed)
Felt to be from scar tissue

## 2014-03-25 NOTE — Assessment & Plan Note (Addendum)
No se of med . CHOL 144 03/18/2014  TRIG 50.0 03/18/2014  HDL 46.20 03/18/2014  LDLDIRECT 172.4 10/02/2007  LDLCALC 88 03/18/2014

## 2014-03-25 NOTE — Patient Instructions (Addendum)
Continue lifestyle intervention healthy eating and exercise . weight loss can help blood sugar from progressing .  Last twinrix today . No change in meds . Can consider metformin  If wish or if sugars rise Plan hga1c inabout 6 months and rov   Consider jaw joint as cause of some of your discomfort right ear . Have your dentist assess . Recheck if ear more problematic but no infection today.    Exercise to Lose Weight Exercise and a healthy diet may help you lose weight. Your doctor may suggest specific exercises. EXERCISE IDEAS AND TIPS  Choose low-cost things you enjoy doing, such as walking, bicycling, or exercising to workout videos.  Take stairs instead of the elevator.  Walk during your lunch break.  Park your car further away from work or school.  Go to a gym or an exercise class.  Start with 5 to 10 minutes of exercise each day. Build up to 30 minutes of exercise 4 to 6 days a week.  Wear shoes with good support and comfortable clothes.  Stretch before and after working out.  Work out until you breathe harder and your heart beats faster.  Drink extra water when you exercise.  Do not do so much that you hurt yourself, feel dizzy, or get very short of breath. Exercises that burn about 150 calories:  Running 1  miles in 15 minutes.  Playing volleyball for 45 to 60 minutes.  Washing and waxing a car for 45 to 60 minutes.  Playing touch football for 45 minutes.  Walking 1  miles in 35 minutes.  Pushing a stroller 1  miles in 30 minutes.  Playing basketball for 30 minutes.  Raking leaves for 30 minutes.  Bicycling 5 miles in 30 minutes.  Walking 2 miles in 30 minutes.  Dancing for 30 minutes.  Shoveling snow for 15 minutes.  Swimming laps for 20 minutes.  Walking up stairs for 15 minutes.  Bicycling 4 miles in 15 minutes.  Gardening for 30 to 45 minutes.  Jumping rope for 15 minutes.  Washing windows or floors for 45 to 60 minutes. Document  Released: 12/31/2010 Document Revised: 02/20/2012 Document Reviewed: 12/31/2010 [facility] Patient Information 2014 [place], [place].

## 2014-04-14 ENCOUNTER — Other Ambulatory Visit: Payer: Self-pay

## 2014-04-14 DIAGNOSIS — Z1231 Encounter for screening mammogram for malignant neoplasm of breast: Secondary | ICD-10-CM

## 2014-05-06 ENCOUNTER — Other Ambulatory Visit: Payer: Self-pay | Admitting: Internal Medicine

## 2014-05-06 NOTE — Telephone Encounter (Signed)
Sent to the pharmacy by e-scribe. 

## 2014-05-13 ENCOUNTER — Other Ambulatory Visit: Payer: Self-pay | Admitting: Internal Medicine

## 2014-05-13 NOTE — Telephone Encounter (Signed)
Sent to the pharmacy by e-scribe. 

## 2014-05-22 ENCOUNTER — Ambulatory Visit: Payer: BC Managed Care – PPO

## 2014-05-27 ENCOUNTER — Ambulatory Visit
Admission: RE | Admit: 2014-05-27 | Discharge: 2014-05-27 | Disposition: A | Discharge status: Home / Self Care | Payer: BC Managed Care – PPO | Source: Ambulatory Visit

## 2014-05-27 DIAGNOSIS — Z1231 Encounter for screening mammogram for malignant neoplasm of breast: Secondary | ICD-10-CM

## 2014-09-17 ENCOUNTER — Other Ambulatory Visit (INDEPENDENT_AMBULATORY_CARE_PROVIDER_SITE_OTHER): Payer: BC Managed Care – PPO

## 2014-09-17 DIAGNOSIS — E1310 Other specified diabetes mellitus with ketoacidosis without coma: Secondary | ICD-10-CM

## 2014-09-17 DIAGNOSIS — E1110 Type 2 diabetes mellitus with ketoacidosis without coma: Secondary | ICD-10-CM

## 2014-09-17 LAB — HEMOGLOBIN A1C: Hgb A1c MFr Bld: 6 % (ref 4.6–6.5)

## 2014-09-24 ENCOUNTER — Encounter: Payer: Self-pay | Admitting: Internal Medicine

## 2014-09-24 ENCOUNTER — Ambulatory Visit (INDEPENDENT_AMBULATORY_CARE_PROVIDER_SITE_OTHER): Payer: BC Managed Care – PPO | Admitting: Internal Medicine

## 2014-09-24 VITALS — BP 116/66 | Temp 98.1°F | Ht 63.25 in | Wt 199.2 lb

## 2014-09-24 DIAGNOSIS — J302 Other seasonal allergic rhinitis: Secondary | ICD-10-CM

## 2014-09-24 DIAGNOSIS — R7309 Other abnormal glucose: Secondary | ICD-10-CM

## 2014-09-24 DIAGNOSIS — Z23 Encounter for immunization: Secondary | ICD-10-CM

## 2014-09-24 DIAGNOSIS — I10 Essential (primary) hypertension: Secondary | ICD-10-CM

## 2014-09-24 DIAGNOSIS — R7303 Prediabetes: Secondary | ICD-10-CM | POA: Insufficient documentation

## 2014-09-24 NOTE — Progress Notes (Signed)
Pre visit review using our clinic review tool, if applicable. No additional management support is needed unless otherwise documented below in the visit note.  Chief Complaint  Patient presents with  . Follow-up    HPI: Nicole Gonzalez is 43 y.o. comes in for fu of prediabetesdm ht  hyperlipidemia . Since her last visit she has had  2 vacations  Fit bit watching carb coutnt and  Exercise . Has worked on cutting out carbs feels like she's lost weight although it did show the scale. Has had more evaluation regard to her allergies noted allergic rhinitis switching to Xyzal but the ear evaluation was done by ENT had a CT scan may end up being TMJ. Preliminary results may be normal.    ROS: See pertinent positives and negatives per HPI. No chest pain shortness of breath significant physical limitation to activity counting steps  Past Medical History  Diagnosis Date  . Hyperlipidemia     [id number] trx   . History of gestational diabetes     x2  . Anemia      better after hysterectomy  . Fracture of lower leg      fall 2011  . History of basal cell carcinoma excision   . DIABETES MELLITUS, GESTATIONAL, HX OF 06/02/2009    Qualifier: Diagnosis of  By: Regis Bill MD, Standley Brooking   . Increased frequency of headaches 03/15/2012  . UNSPECIFIED TACHYCARDIA 09/01/2010    Qualifier: Diagnosis of  By: Charlett Blake MD, Erline Levine    . HTN (hypertension)   . Obesity   . GERD (gastroesophageal reflux disease)   . Seizures     as an infant d/t fever    Family History  Problem Relation Age of Onset  . Melanoma Father     x 3 times  . Hypertension Father   . Diabetes Maternal Grandmother   . Hyperlipidemia Father   . Squamous cell carcinoma Father   . Arthritis Brother      Psoriatic  . Pancreatic cancer Paternal Grandmother   . Colon polyps Father   . Hyperlipidemia Maternal Grandmother   . Heart disease Father   . Heart disease Paternal Grandfather   . Heart disease Maternal Grandmother     History    Social History  . Marital Status: Married    Spouse Name: N/A    Number of Children: 2  . Years of Education: N/A   Occupational History  . Agricultural consultant   .     Social History Main Topics  . Smoking status: Never Smoker   . Smokeless tobacco: Never Used  . Alcohol Use: No  . Drug Use: No  . Sexual Activity: None   Other Topics Concern  . 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    Nonsmoker married    Outpatient Encounter Prescriptions as of 09/24/2014  Medication Sig  . fexofenadine (ALLEGRA) 180 MG tablet Take 180 mg by mouth daily.  Marland Kitchen lisinopril (PRINIVIL,ZESTRIL) 10 MG tablet TAKE 1 TABLET BY MOUTH DAILY FOR HIGH BLOOD PRESSURE  . montelukast (SINGULAIR) 10 MG tablet Take 10 mg by mouth daily.   Marland Kitchen omeprazole (PRILOSEC) 20 MG capsule Take 20 mg by mouth daily.   Norvel Richards 80 MCG/ACT AERS Place 2 sprays into the nose daily.   . simvastatin (ZOCOR)  40 MG tablet Take 1 tablet (40 mg total) by mouth at bedtime.  . simvastatin (ZOCOR) 40 MG tablet TAKE 1 BY MOUTH DAILY AT BEDTIME  . levocetirizine (XYZAL) 5 MG tablet     EXAM:  BP 116/66  Temp(Src) 98.1 F (36.7 C) (Oral)  Ht 5' 3.25" (1.607 m)  Wt 199 lb 3.2 oz (90.357 kg)  BMI 34.99 kg/m2  Body mass index is 34.99 kg/(m^2).  GENERAL: vitals reviewed and listed above, alert, oriented, appears well hydrated and in no acute distress HEENT: atraumatic, conjunctiva  clear, no obvious abnormalities on inspection of external nose and ears OP : no lesion edema or exudate  NECK: no obvious masses on inspection palpation  LUNGS: clear to auscultation bilaterally, no wheezes, rales or rhonchi, good air movement CV: HRRR, no clubbing cyanosis or  peripheral edema nl cap refill  MS: moves all extremities without noticeable focal  abnormality PSYCH: pleasant and cooperative, no obvious depression or anxiety Lab  Results  Component Value Date   WBC 5.6 03/18/2014   HGB 13.7 03/18/2014   HCT 40.7 03/18/2014   PLT 198.0 03/18/2014   GLUCOSE 109* 03/18/2014   CHOL 144 03/18/2014   TRIG 50.0 03/18/2014   HDL 46.20 03/18/2014   LDLDIRECT 172.4 10/02/2007   LDLCALC 88 03/18/2014   ALT 25 03/18/2014   AST 17 03/18/2014   NA 141 03/18/2014   K 4.4 03/18/2014   CL 107 03/18/2014   CREATININE 0.6 03/18/2014   BUN 19 03/18/2014   CO2 26 03/18/2014   TSH 1.76 03/18/2014   HGBA1C 6.0 09/17/2014   Wt Readings from Last 3 Encounters:  09/24/14 199 lb 3.2 oz (90.357 kg)  03/25/14 198 lb (89.812 kg)  02/19/14 200 lb (90.719 kg)   BP Readings from Last 3 Encounters:  09/24/14 116/66  03/25/14 110/80  02/19/14 110/72    ASSESSMENT AND PLAN:  Discussed the following assessment and plan:  Prediabetes - Stable A1c lifestyle changes continue intensify recheck A1c with CPX labs in 6 months  Other seasonal allergic rhinitis - see allergy consult  Essential hypertension - controlled   Need for prophylactic vaccination and inoculation against influenza - Plan: Flu Vaccine QUAD 36+ mos PF IM (Fluarix Quad PF) GET pna 23 next visit  May se dentist for bit guard for ear discomfort  -Patient advised to return or notify health care team  if symptoms worsen ,persist or new concerns arise.  Patient Instructions  Intensify lifestyle interventions. As you are doing with exercise .  Plan cpx with full labs  And hga1c at that time.    Standley Brooking. Panosh M.D.

## 2014-09-24 NOTE — Patient Instructions (Signed)
Intensify lifestyle interventions. As you are doing with exercise .  Plan cpx with full labs  And hga1c at that time.

## 2014-10-16 ENCOUNTER — Ambulatory Visit (INDEPENDENT_AMBULATORY_CARE_PROVIDER_SITE_OTHER): Payer: BC Managed Care – PPO | Admitting: Internal Medicine

## 2014-10-16 ENCOUNTER — Encounter: Payer: Self-pay | Admitting: Internal Medicine

## 2014-10-16 VITALS — BP 120/74 | HR 87 | Temp 98.5°F | Wt 198.0 lb

## 2014-10-16 DIAGNOSIS — J309 Allergic rhinitis, unspecified: Secondary | ICD-10-CM | POA: Insufficient documentation

## 2014-10-16 DIAGNOSIS — J320 Chronic maxillary sinusitis: Secondary | ICD-10-CM | POA: Insufficient documentation

## 2014-10-16 MED ORDER — AMOXICILLIN-POT CLAVULANATE 875-125 MG PO TABS: 1.0000 | ORAL_TABLET | Freq: Two times a day (BID) | ORAL | Status: DC

## 2014-10-16 NOTE — Patient Instructions (Signed)
Sinus infection  On left . Continue   Can add 2 days of afrin nose spray on left  andwarm compresses  To help with sinus pain.  Add antibiotic also   Sinusitis Sinusitis is redness, soreness, and inflammation of the paranasal sinuses. Paranasal sinuses are air pockets within the bones of your face (beneath the eyes, the middle of the forehead, or above the eyes). In healthy paranasal sinuses, mucus is able to drain out, and air is able to circulate through them by way of your nose. However, when your paranasal sinuses are inflamed, mucus and air can become trapped. This can allow bacteria and other germs to grow and cause infection. Sinusitis can develop quickly and last only a short time (acute) or continue over a long period (chronic). Sinusitis that lasts for more than 12 weeks is considered chronic.  CAUSES  Causes of sinusitis include:  Allergies.  Structural abnormalities, such as displacement of the cartilage that separates your nostrils (deviated septum), which can decrease the air flow through your nose and sinuses and affect sinus drainage.  Functional abnormalities, such as when the small hairs (cilia) that line your sinuses and help remove mucus do not work properly or are not present. SIGNS AND SYMPTOMS  Symptoms of acute and chronic sinusitis are the same. The primary symptoms are pain and pressure around the affected sinuses. Other symptoms include:  Upper toothache.  Earache.  Headache.  Bad breath.  Decreased sense of smell and taste.  A cough, which worsens when you are lying flat.  Fatigue.  Fever.  Thick drainage from your nose, which often is green and may contain pus (purulent).  Swelling and warmth over the affected sinuses. DIAGNOSIS  Your health care provider will perform a physical exam. During the exam, your health care provider may:  Look in your nose for signs of abnormal growths in your nostrils (nasal polyps).  Tap over the affected sinus to  check for signs of infection.  View the inside of your sinuses (endoscopy) using an imaging device that has a light attached (endoscope). If your health care provider suspects that you have chronic sinusitis, one or more of the following tests may be recommended:  Allergy tests.  Nasal culture. A sample of mucus is taken from your nose, sent to a lab, and screened for bacteria.  Nasal cytology. A sample of mucus is taken from your nose and examined by your health care provider to determine if your sinusitis is related to an allergy. TREATMENT  Most cases of acute sinusitis are related to a viral infection and will resolve on their own within 10 days. Sometimes medicines are prescribed to help relieve symptoms (pain medicine, decongestants, nasal steroid sprays, or saline sprays).  However, for sinusitis related to a bacterial infection, your health care provider will prescribe antibiotic medicines. These are medicines that will help kill the bacteria causing the infection.  Rarely, sinusitis is caused by a fungal infection. In theses cases, your health care provider will prescribe antifungal medicine. For some cases of chronic sinusitis, surgery is needed. Generally, these are cases in which sinusitis recurs more than 3 times per year, despite other treatments. HOME CARE INSTRUCTIONS   Drink plenty of water. Water helps thin the mucus so your sinuses can drain more easily.  Use a humidifier.  Inhale steam 3 to 4 times a day (for example, sit in the bathroom with the shower running).  Apply a warm, moist washcloth to your face 3 to 4 times  a day, or as directed by your health care provider.  Use saline nasal sprays to help moisten and clean your sinuses.  Take medicines only as directed by your health care provider.  If you were prescribed either an antibiotic or antifungal medicine, finish it all even if you start to feel better. SEEK IMMEDIATE MEDICAL CARE IF:  You have increasing  pain or severe headaches.  You have nausea, vomiting, or drowsiness.  You have swelling around your face.  You have vision problems.  You have a stiff neck.  You have difficulty breathing. MAKE SURE YOU:   Understand these instructions.  Will watch your condition.  Will get help right away if you are not doing well or get worse. Document Released: 11/28/2005 Document Revised: 04/14/2014 Document Reviewed: 12/13/2011 [facility] Patient Information 2015 [place], [place]. This information is not intended to replace advice given to you by your health care provider. Make sure you discuss any questions you have with your health care provider.

## 2014-10-16 NOTE — Progress Notes (Signed)
Pre visit review using our clinic review tool, if applicable. No additional management support is needed unless otherwise documented below in the visit note.   Chief Complaint  Patient presents with  . Cough    Started 1 week ago.  Cough is productive of a yellow sputum.  . Sinus Pain/Pressure  . Generalized Body Aches  . Post Nasal Drip    HPI: Patient Nicole Gonzalez  comes in today for SDA for  new problem evaluation. Sx for a week  Poss allergies   Now 1 day of left face soreness   Non fever  Yellow pnd.   Cough at night .  Decongestant and mucinex.  On meds for allergy. And now  Added mucinex dm and decongestant.  ROS: See pertinent positives and negatives per HPI. No fever pos cough no hemoptysis   Past Medical History  Diagnosis Date  . Hyperlipidemia     [id number] 043 trx   . History of gestational diabetes     x2  . Anemia      better after hysterectomy  . Fracture of lower leg      fall 2011  . History of basal cell carcinoma excision   . DIABETES MELLITUS, GESTATIONAL, HX OF 06/02/2009    Qualifier: Diagnosis of  By: Regis Bill MD, Standley Brooking   . Increased frequency of headaches 03/15/2012  . UNSPECIFIED TACHYCARDIA 09/01/2010    Qualifier: Diagnosis of  By: Charlett Blake MD, Erline Levine    . HTN (hypertension)   . Obesity   . GERD (gastroesophageal reflux disease)   . Seizures     as an infant d/t fever    Family History  Problem Relation Age of Onset  . Melanoma Father     x 3 times  . Hypertension Father   . Diabetes Maternal Grandmother   . Hyperlipidemia Father   . Squamous cell carcinoma Father   . Arthritis Brother      Psoriatic  . Pancreatic cancer Paternal Grandmother   . Colon polyps Father   . Hyperlipidemia Maternal Grandmother   . Heart disease Father   . Heart disease Paternal Grandfather   . Heart disease Maternal Grandmother     History   Social History  . Marital Status: Married    Spouse Name: N/A    Number of Children: 2  . Years of Education: N/A    Occupational History  . Agricultural consultant   .     Social History Main Topics  . Smoking status: Never Smoker   . Smokeless tobacco: Never Used  . Alcohol Use: No  . Drug Use: No  . Sexual Activity: None   Other Topics Concern  . 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    Nonsmoker married    Outpatient Encounter Prescriptions as of 10/16/2014  Medication Sig  . fexofenadine (ALLEGRA) 180 MG tablet Take 180 mg by mouth daily.  Marland Kitchen levocetirizine (XYZAL) 5 MG tablet   . lisinopril (PRINIVIL,ZESTRIL) 10 MG tablet TAKE 1 TABLET BY MOUTH DAILY FOR HIGH BLOOD PRESSURE  . montelukast (SINGULAIR) 10 MG tablet Take 10 mg by mouth daily.   Marland Kitchen omeprazole (PRILOSEC) 20 MG capsule Take 20 mg by mouth daily.   Norvel Richards 80 MCG/ACT AERS Place 2 sprays into the nose daily.   Marland Kitchen  simvastatin (ZOCOR) 40 MG tablet Take 1 tablet (40 mg total) by mouth at bedtime.  . simvastatin (ZOCOR) 40 MG tablet TAKE 1 BY MOUTH DAILY AT BEDTIME  . amoxicillin-clavulanate (AUGMENTIN) 875-125 MG per tablet Take 1 tablet by mouth every 12 (twelve) hours.    EXAM:  BP 120/74 mmHg  Pulse 87  Temp(Src) 98.5 F (36.9 C) (Oral)  Wt 198 lb (89.812 kg)  SpO2 97%  Body mass index is 34.78 kg/(m^2). WDWN in NAD  quiet respirations; mildly congested  somewhat hoarse. Non toxic . HEENT: Normocephalic ;atraumatic , Eyes;  PERRL, EOMs  Full, lids and conjunctiva clear,,Ears: no deformities, canals nl, TM landmarks normal, Nose: no deformity  Congested;faceleft maxilla   tender Mouth : OP clear without lesion or edema . Red cobblestoning  Neck: Supple without adenopathy or masses or bruits Chest:  Clear to A&P without wheezes rales or rhonchi CV:  S1-S2 no gallops or murmurs peripheral perfusion is normal Skin :nl perfusion and no acute rashes   ASSESSMENT AND PLAN:  Discussed the following assessment and  plan:  Left maxillary sinusitis  Allergic rhinitis, unspecified allergic rhinitis type   Expectant management. Add antibiotic  Afrin short term and continue allergy regimen -Patient advised to return or notify health care team  if symptoms worsen ,persist or new concerns arise.  Patient Instructions  Sinus infection  On left . Continue   Can add 2 days of afrin nose spray on left  andwarm compresses  To help with sinus pain.  Add antibiotic also   Sinusitis Sinusitis is redness, soreness, and inflammation of the paranasal sinuses. Paranasal sinuses are air pockets within the bones of your face (beneath the eyes, the middle of the forehead, or above the eyes). In healthy paranasal sinuses, mucus is able to drain out, and air is able to circulate through them by way of your nose. However, when your paranasal sinuses are inflamed, mucus and air can become trapped. This can allow bacteria and other germs to grow and cause infection. Sinusitis can develop quickly and last only a short time (acute) or continue over a long period (chronic). Sinusitis that lasts for more than 12 weeks is considered chronic.  CAUSES  Causes of sinusitis include:  Allergies.  Structural abnormalities, such as displacement of the cartilage that separates your nostrils (deviated septum), which can decrease the air flow through your nose and sinuses and affect sinus drainage.  Functional abnormalities, such as when the small hairs (cilia) that line your sinuses and help remove mucus do not work properly or are not present. SIGNS AND SYMPTOMS  Symptoms of acute and chronic sinusitis are the same. The primary symptoms are pain and pressure around the affected sinuses. Other symptoms include:  Upper toothache.  Earache.  Headache.  Bad breath.  Decreased sense of smell and taste.  A cough, which worsens when you are lying flat.  Fatigue.  Fever.  Thick drainage from your nose, which often is green and  may contain pus (purulent).  Swelling and warmth over the affected sinuses. DIAGNOSIS  Your health care provider will perform a physical exam. During the exam, your health care provider may:  Look in your nose for signs of abnormal growths in your nostrils (nasal polyps).  Tap over the affected sinus to check for signs of infection.  View the inside of your sinuses (endoscopy) using an imaging device that has a light attached (endoscope). If your health care provider suspects that you have chronic sinusitis, one  or more of the following tests may be recommended:  Allergy tests.  Nasal culture. A sample of mucus is taken from your nose, sent to a lab, and screened for bacteria.  Nasal cytology. A sample of mucus is taken from your nose and examined by your health care provider to determine if your sinusitis is related to an allergy. TREATMENT  Most cases of acute sinusitis are related to a viral infection and will resolve on their own within 10 days. Sometimes medicines are prescribed to help relieve symptoms (pain medicine, decongestants, nasal steroid sprays, or saline sprays).  However, for sinusitis related to a bacterial infection, your health care provider will prescribe antibiotic medicines. These are medicines that will help kill the bacteria causing the infection.  Rarely, sinusitis is caused by a fungal infection. In theses cases, your health care provider will prescribe antifungal medicine. For some cases of chronic sinusitis, surgery is needed. Generally, these are cases in which sinusitis recurs more than 3 times per year, despite other treatments. HOME CARE INSTRUCTIONS   Drink plenty of water. Water helps thin the mucus so your sinuses can drain more easily.  Use a humidifier.  Inhale steam 3 to 4 times a day (for example, sit in the bathroom with the shower running).  Apply a warm, moist washcloth to your face 3 to 4 times a day, or as directed by your health care  provider.  Use saline nasal sprays to help moisten and clean your sinuses.  Take medicines only as directed by your health care provider.  If you were prescribed either an antibiotic or antifungal medicine, finish it all even if you start to feel better. SEEK IMMEDIATE MEDICAL CARE IF:  You have increasing pain or severe headaches.  You have nausea, vomiting, or drowsiness.  You have swelling around your face.  You have vision problems.  You have a stiff neck.  You have difficulty breathing. MAKE SURE YOU:   Understand these instructions.  Will watch your condition.  Will get help right away if you are not doing well or get worse. Document Released: 11/28/2005 Document Revised: 04/14/2014 Document Reviewed: 12/13/2011 [facility] Patient Information 2015 [place], [place]. This information is not intended to replace advice given to you by your health care provider. Make sure you discuss any questions you have with your health care provider.      Standley Brooking. Panosh M.D.

## 2014-10-23 ENCOUNTER — Telehealth: Payer: Self-pay | Admitting: Internal Medicine

## 2014-10-23 MED ORDER — FLUCONAZOLE 150 MG PO TABS: ORAL_TABLET | ORAL | Status: DC

## 2014-10-23 NOTE — Telephone Encounter (Signed)
Vaginal odor, itching and some redness.  Hard to tell if she has any discharge because she is using depends pads due to urine leakage.    Started on Monday evening. Denies fever, back, side or abdominal pain. Was started on antibiotics 10/16/14.  Please advise.  Thanks!

## 2014-10-23 NOTE — Telephone Encounter (Signed)
Pt would like a script for yeast infection. She has used diflucan. [id number] - [place], [place] - [address]

## 2014-10-23 NOTE — Telephone Encounter (Signed)
Per WP, okay to fill.  Diflucan 150 mg to take one pill and repeat in 3 days if needed.  #2Durene Cal to the pharmacy.  Left the pt a message on her cell.

## 2014-10-23 NOTE — Telephone Encounter (Signed)
Printed for WP to review. 

## 2014-12-17 ENCOUNTER — Encounter: Payer: Self-pay | Admitting: Internal Medicine

## 2015-01-12 ENCOUNTER — Other Ambulatory Visit: Payer: Self-pay | Admitting: Internal Medicine

## 2015-01-12 NOTE — Telephone Encounter (Signed)
Sent to the pharmacy by e-scribe. 

## 2015-02-26 ENCOUNTER — Telehealth: Payer: Self-pay | Admitting: *Deleted

## 2015-02-26 MED ORDER — LISINOPRIL 10 MG PO TABS: 10.0000 mg | ORAL_TABLET | Freq: Every day | ORAL | Status: DC

## 2015-02-26 NOTE — Telephone Encounter (Signed)
Refill lisinopril 10 mg once daily [facility]

## 2015-02-26 NOTE — Telephone Encounter (Signed)
Sent to the pharmacy for 90 days.  Pt has upcoming CPX on 03/27/15

## 2015-03-19 ENCOUNTER — Other Ambulatory Visit (INDEPENDENT_AMBULATORY_CARE_PROVIDER_SITE_OTHER): Payer: BLUE CROSS/BLUE SHIELD

## 2015-03-19 DIAGNOSIS — Z0000 Encounter for general adult medical examination without abnormal findings: Secondary | ICD-10-CM

## 2015-03-19 LAB — CBC WITH DIFFERENTIAL/PLATELET
BASOS ABS: 0 10*3/uL (ref 0.0–0.1)
Basophils Relative: 0.4 % (ref 0.0–3.0)
EOS ABS: 0.1 10*3/uL (ref 0.0–0.7)
Eosinophils Relative: 1.6 % (ref 0.0–5.0)
HCT: 43 % (ref 36.0–46.0)
Hemoglobin: 14.6 g/dL (ref 12.0–15.0)
LYMPHS PCT: 25.3 % (ref 12.0–46.0)
Lymphs Abs: 2.1 10*3/uL (ref 0.7–4.0)
MCHC: 34 g/dL (ref 30.0–36.0)
MCV: 85.9 fl (ref 78.0–100.0)
MONOS PCT: 5.2 % (ref 3.0–12.0)
Monocytes Absolute: 0.4 10*3/uL (ref 0.1–1.0)
Neutro Abs: 5.6 10*3/uL (ref 1.4–7.7)
Neutrophils Relative %: 67.5 % (ref 43.0–77.0)
Platelets: 224 10*3/uL (ref 150.0–400.0)
RBC: 5.01 Mil/uL (ref 3.87–5.11)
RDW: 13.3 % (ref 11.5–15.5)
WBC: 8.3 10*3/uL (ref 4.0–10.5)

## 2015-03-19 LAB — LIPID PANEL
CHOL/HDL RATIO: 3
Cholesterol: 161 mg/dL (ref 0–200)
HDL: 52.9 mg/dL (ref >39.00)
LDL Cholesterol: 96 mg/dL (ref 0–99)
NonHDL: 108.1
Triglycerides: 59 mg/dL (ref 0.0–149.0)
VLDL: 11.8 mg/dL (ref 0.0–40.0)

## 2015-03-19 LAB — BASIC METABOLIC PANEL
BUN: 19 mg/dL (ref 6–23)
CALCIUM: 9.6 mg/dL (ref 8.4–10.5)
CO2: 26 mEq/L (ref 19–32)
CREATININE: 0.61 mg/dL (ref 0.40–1.20)
Chloride: 103 mEq/L (ref 96–112)
GFR: 112.71 mL/min (ref >60.00)
GLUCOSE: 133 mg/dL — AB (ref 70–99)
Potassium: 4.8 mEq/L (ref 3.5–5.1)
SODIUM: 135 meq/L (ref 135–145)

## 2015-03-19 LAB — HEPATIC FUNCTION PANEL
ALBUMIN: 4.4 g/dL (ref 3.5–5.2)
ALT: 21 U/L (ref 0–35)
AST: 14 U/L (ref 0–37)
Alkaline Phosphatase: 58 U/L (ref 39–117)
Bilirubin, Direct: 0.2 mg/dL (ref 0.0–0.3)
TOTAL PROTEIN: 7.4 g/dL (ref 6.0–8.3)
Total Bilirubin: 1 mg/dL (ref 0.2–1.2)

## 2015-03-19 LAB — HEMOGLOBIN A1C: Hgb A1c MFr Bld: 6.2 % (ref 4.6–6.5)

## 2015-03-19 LAB — TSH: TSH: 2.72 u[IU]/mL (ref 0.35–4.50)

## 2015-03-20 ENCOUNTER — Other Ambulatory Visit: Payer: BC Managed Care – PPO

## 2015-03-27 ENCOUNTER — Encounter: Payer: Self-pay | Admitting: Internal Medicine

## 2015-03-27 ENCOUNTER — Ambulatory Visit (INDEPENDENT_AMBULATORY_CARE_PROVIDER_SITE_OTHER): Payer: BLUE CROSS/BLUE SHIELD | Admitting: Internal Medicine

## 2015-03-27 VITALS — BP 110/80 | Temp 98.2°F | Ht 64.0 in | Wt 204.0 lb

## 2015-03-27 DIAGNOSIS — Z23 Encounter for immunization: Secondary | ICD-10-CM

## 2015-03-27 DIAGNOSIS — R7309 Other abnormal glucose: Secondary | ICD-10-CM | POA: Diagnosis not present

## 2015-03-27 DIAGNOSIS — E785 Hyperlipidemia, unspecified: Secondary | ICD-10-CM | POA: Diagnosis not present

## 2015-03-27 DIAGNOSIS — Z808 Family history of malignant neoplasm of other organs or systems: Secondary | ICD-10-CM

## 2015-03-27 DIAGNOSIS — Z0000 Encounter for general adult medical examination without abnormal findings: Secondary | ICD-10-CM | POA: Diagnosis not present

## 2015-03-27 DIAGNOSIS — Z8632 Personal history of gestational diabetes: Secondary | ICD-10-CM

## 2015-03-27 DIAGNOSIS — Z803 Family history of malignant neoplasm of breast: Secondary | ICD-10-CM | POA: Insufficient documentation

## 2015-03-27 DIAGNOSIS — I10 Essential (primary) hypertension: Secondary | ICD-10-CM | POA: Diagnosis not present

## 2015-03-27 DIAGNOSIS — R7303 Prediabetes: Secondary | ICD-10-CM

## 2015-03-27 MED ORDER — METFORMIN HCL ER 500 MG PO TB24: 500.0000 mg | ORAL_TABLET | Freq: Every day | ORAL | Status: DC

## 2015-03-27 NOTE — Patient Instructions (Signed)
Intensify lifestyle interventions.  Healthy lifestyle includes : At least 150 minutes of exercise weeks  , weight at healthy levels, which is usually   BMI 19-25. Avoid trans fats and processed foods;  Increase fresh fruits and veges to 5 servings per day. And avoid sweet beverages including tea and juice. Mediterranean diet with olive oil and nuts have been noted to be heart and brain healthy . Avoid tobacco products . Limit  alcohol to  7 per week for women and 14 servings for men.  Get adequate sleep . Wear seat belts . Don't text and drive .   Add metformin er once a day inaddition to  Above. Contact us for poss genetic counseling appt   When get more infor about  Your brothers breast cancer

## 2015-03-27 NOTE — Progress Notes (Signed)
Pre visit review using our clinic review tool, if applicable. No additional management support is needed unless otherwise documented below in the visit note.  Chief Complaint  Patient presents with  . Annual Exam    meds    HPI: Patient  Nicole Gonzalez  44 y.o. comes in today for [place] visit  BP doing well  Lipids no se of meds  Bro dx woith breast cancer  Not  Attending to eating as much  But plans on working on it better  BG  Not checking  Surprised  bg was  Up at last level Health Maintenance  Topic Date Due  . PNEUMOCOCCAL POLYSACCHARIDE VACCINE (1) 03/06/1972  . OPHTHALMOLOGY EXAM  03/06/1980  . URINE MICROALBUMIN  03/06/1980  . FOOT EXAM  03/26/2015  . HIV Screening  03/15/2016 (Originally 03/06/1985)  . INFLUENZA VACCINE  07/13/2015  . HEMOGLOBIN A1C  09/18/2015  . PAP SMEAR  03/26/2016  . COLONOSCOPY  05/03/2018  . TETANUS/TDAP  11/18/2018   Health Maintenance Review LIFESTYLE:  Exercise:   Steps   30 acitev minutes Tobacco/ETS: no Alcohol: no Sugar beverages: ocass grape juice  Sleep: about 6-8 hours   Drug use: no PAP: utd  MAMMO: utd  Hx bx  infectgion   ROS:  GEN/ HEENT: No fever, significant weight changes sweats headaches vision problems hearing changes, CV/ PULM; No chest pain shortness of breath cough, syncope,edema  change in exercise tolerance. GI /GU: No adominal pain, vomiting, change in bowel habits. No blood in the stool. No significant GU symptoms. SKIN/HEME: ,no acute skin rashes suspicious lesions or bleeding. No lymphadenopathy, nodules, masses.  NEURO/ PSYCH:  No neurologic signs such as weakness numbness. No depression anxiety. IMM/ Allergy: No unusual infections.  Allergy .   REST of 12 system review negative except as per HPI   Past Medical History  Diagnosis Date  . Hyperlipidemia     [id number] 043 trx   . History of gestational diabetes     x2  . Anemia      better after hysterectomy  . Fracture of lower leg    fall 2011  . History of basal cell carcinoma excision   . DIABETES MELLITUS, GESTATIONAL, HX OF 06/02/2009    Qualifier: Diagnosis of  By: Regis Bill MD, Standley Brooking   . Increased frequency of headaches 03/15/2012  . UNSPECIFIED TACHYCARDIA 09/01/2010    Qualifier: Diagnosis of  By: Charlett Blake MD, Erline Levine    . HTN (hypertension)   . Obesity   . GERD (gastroesophageal reflux disease)   . Seizures     as an infant d/t fever    Past Surgical History  Procedure Laterality Date  . Tonsillectomy    . Abdominal hysterectomy  06/29/2010     question had abnormal cells on Path, fibroids  . Basal cell carcinoma excision      Family History  Problem Relation Age of Onset  . Melanoma Father     x 3 times  . Hypertension Father   . Diabetes Maternal Grandmother   . Hyperlipidemia Father   . Squamous cell carcinoma Father   . Arthritis Brother      Psoriatic  . Pancreatic cancer Paternal Grandmother   . Colon polyps Father   . Hyperlipidemia Maternal Grandmother   . Heart disease Father   . Heart disease Paternal Grandfather   . Heart disease Maternal Grandmother     History   Social History  . Marital Status: Married  Spouse Name: N/A  . Number of Children: 2  . Years of Education: N/A   Occupational History  . Agricultural consultant   .     Social History Main Topics  . Smoking status: Never Smoker   . Smokeless tobacco: Never Used  . Alcohol Use: No  . Drug Use: No  . Sexual Activity: Not on file   Other Topics Concern  . 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    Nonsmoker married    Outpatient Encounter Prescriptions as of 03/27/2015  Medication Sig  . levocetirizine (XYZAL) 5 MG tablet   . lisinopril (PRINIVIL,ZESTRIL) 10 MG tablet Take 1 tablet (10 mg total) by mouth daily. for high blood pressure  . montelukast (SINGULAIR) 10 MG tablet Take 10 mg by  mouth daily.   Marland Kitchen omeprazole (PRILOSEC) 20 MG capsule Take 20 mg by mouth daily.   Norvel Richards 80 MCG/ACT AERS Place 2 sprays into the nose daily.   . simvastatin (ZOCOR) 40 MG tablet TAKE 1 BY MOUTH DAILY AT BEDTIME  . metFORMIN (GLUCOPHAGE-XR) 500 MG 24 hr tablet Take 1 tablet (500 mg total) by mouth daily with breakfast.  . [DISCONTINUED] amoxicillin-clavulanate (AUGMENTIN) 875-125 MG per tablet Take 1 tablet by mouth every 12 (twelve) hours.  . [DISCONTINUED] fexofenadine (ALLEGRA) 180 MG tablet Take 180 mg by mouth daily.  . [DISCONTINUED] fluconazole (DIFLUCAN) 150 MG tablet Take one tablet by mouth.  May repeat in three days if needed.  . [DISCONTINUED] simvastatin (ZOCOR) 40 MG tablet Take 1 tablet (40 mg total) by mouth at bedtime.    EXAM:  BP 110/80 mmHg  Temp(Src) 98.2 F (36.8 C) (Oral)  Ht 5\' 4"  (1.626 m)  Wt 204 lb (92.534 kg)  BMI 35.00 kg/m2  Body mass index is 35 kg/(m^2).  Physical Exam: Vital signs reviewed CWC:BJSE is a well-developed well-nourished alert cooperative    who appearsr stated age in no acute distress.  HEENT: normocephalic atraumatic , Eyes: PERRL EOM's full, conjunctiva clear, Nares: paten,t no deformity discharge or tenderness., Ears: no deformity EAC's clear TMs with normal landmarks. Mouth: clear OP, no lesions, edema.  Moist mucous membranes. Dentition in adequate repair. NECK: supple without masses, thyromegaly or bruits. CHEST/PULM:  Clear to auscultation and percussion breath sounds equal no wheeze , rales or rhonchi. No chest wall deformities or tenderness. Breast: normal by inspection . No dimpling, discharge, masses, tenderness or discharge . Lumpy on right  CV: PMI is nondisplaced, S1 S2 no gallops, murmurs, rubs. Peripheral pulses are full without delay.No JVD .  ABDOMEN: Bowel sounds normal nontender  No guard or rebound, no hepato splenomegal no CVA tenderness.  No hernia. Extremtities:  No clubbing cyanosis or edema, no acute joint swelling  or redness no focal atrophy NEURO:  Oriented x3, cranial nerves 3-12 appear to be intact, no obvious focal weakness,gait within normal limits no abnormal reflexes or asymmetrical diabetic foot exam normal  SKIN: No acute rashes normal turgor, color, no bruising or petechiae. PSYCH: Oriented, good eye contact, no obvious depression anxiety, cognition and judgment appear normal. LN: no cervical axillary inguinal adenopathy  Lab Results  Component Value Date   WBC 8.3 03/19/2015   HGB 14.6 03/19/2015   HCT 43.0 03/19/2015   PLT 224.0 03/19/2015  GLUCOSE 133* 03/19/2015   CHOL 161 03/19/2015   TRIG 59.0 03/19/2015   HDL 52.90 03/19/2015   LDLDIRECT 172.4 10/02/2007   LDLCALC 96 03/19/2015   ALT 21 03/19/2015   AST 14 03/19/2015   NA 135 03/19/2015   K 4.8 03/19/2015   CL 103 03/19/2015   CREATININE 0.61 03/19/2015   BUN 19 03/19/2015   CO2 26 03/19/2015   TSH 2.72 03/19/2015   HGBA1C 6.2 03/19/2015   BP Readings from Last 3 Encounters:  03/27/15 110/80  10/16/14 120/74  09/24/14 116/66    ASSESSMENT AND PLAN:  Discussed the following assessment and plan:  Visit for preventive health examination  Prediabetes - early diabetes not on meds  add metformin and  i lsi  Essential hypertension - controlled  on acei  Hyperlipidemia  Family history of breast cancer in female - brother  Hx of gestational diabetes mellitus, not currently pregnant  Need for 23-polyvalent pneumococcal polysaccharide vaccine - Plan: Pneumococcal polysaccharide vaccine 23-valent greater than or equal to 2yo subcutaneous/IM  Patient Care Team: Burnis Medin, MD as PCP - General gandhi Azucena Fallen, MD as Attending Physician (Obstetrics and Gynecology) Tiajuana Amass, MD (Allergy) Patient Instructions  Intensify lifestyle interventions.  Healthy lifestyle includes : At least 150 minutes of exercise weeks  , weight at healthy levels, which is usually   BMI 19-25. Avoid trans fats and processed  foods;  Increase fresh fruits and veges to 5 servings per day. And avoid sweet beverages including tea and juice. Mediterranean diet with olive oil and nuts have been noted to be heart and brain healthy . Avoid tobacco products . Limit  alcohol to  7 per week for women and 14 servings for men.  Get adequate sleep . Wear seat belts . Don't text and drive .   Add metformin er once a day inaddition to  Above. Contact us for poss genetic counseling appt   When get more infor about  Your brothers breast cancer     Standley Brooking. Astaria Nanez M.D.

## 2015-04-21 ENCOUNTER — Other Ambulatory Visit: Payer: Self-pay

## 2015-04-21 DIAGNOSIS — Z1231 Encounter for screening mammogram for malignant neoplasm of breast: Secondary | ICD-10-CM

## 2015-05-29 ENCOUNTER — Ambulatory Visit
Admission: RE | Admit: 2015-05-29 | Discharge: 2015-05-29 | Disposition: A | Discharge status: Home / Self Care | Payer: BLUE CROSS/BLUE SHIELD | Source: Ambulatory Visit

## 2015-05-29 DIAGNOSIS — Z1231 Encounter for screening mammogram for malignant neoplasm of breast: Secondary | ICD-10-CM

## 2015-06-01 ENCOUNTER — Other Ambulatory Visit: Payer: Self-pay | Admitting: Internal Medicine

## 2015-06-01 NOTE — Telephone Encounter (Signed)
Sent to the pharmacy by e-scribe.  Pt has upcoming appt on 09/18/15

## 2015-07-02 ENCOUNTER — Telehealth: Payer: Self-pay | Admitting: *Deleted

## 2015-07-02 NOTE — Telephone Encounter (Signed)
Patient is requesting a refill of metFORMIN (GLUCOPHAGE-XR) 500 MG 24 hr tablet Sent to [facility]

## 2015-07-03 MED ORDER — METFORMIN HCL ER 500 MG PO TB24: 500.0000 mg | ORAL_TABLET | Freq: Every day | ORAL | Status: DC

## 2015-07-03 NOTE — Telephone Encounter (Signed)
Sent to the pharmacy by e-scribe. 

## 2015-08-24 ENCOUNTER — Other Ambulatory Visit: Payer: Self-pay | Admitting: Internal Medicine

## 2015-08-24 NOTE — Telephone Encounter (Signed)
Filled for six months on 06/01/15.  Request is early.

## 2015-08-25 ENCOUNTER — Other Ambulatory Visit: Payer: Self-pay | Admitting: Internal Medicine

## 2015-08-25 ENCOUNTER — Telehealth: Payer: Self-pay | Admitting: Family Medicine

## 2015-08-25 ENCOUNTER — Other Ambulatory Visit: Payer: Self-pay | Admitting: Family Medicine

## 2015-08-25 DIAGNOSIS — I10 Essential (primary) hypertension: Secondary | ICD-10-CM

## 2015-08-25 DIAGNOSIS — R7303 Prediabetes: Secondary | ICD-10-CM

## 2015-08-25 NOTE — Telephone Encounter (Signed)
Sent to the pharmacy by e-scribe.  Pt now past due for lab work and follow up.  Will send a message to scheduling.

## 2015-08-25 NOTE — Telephone Encounter (Signed)
Pt is past due for lab work and a follow up with WP. Please help her to make these appointments.  Thanks! I have placed the lab orders.

## 2015-08-26 NOTE — Telephone Encounter (Signed)
Pt already sch

## 2015-09-11 ENCOUNTER — Other Ambulatory Visit (INDEPENDENT_AMBULATORY_CARE_PROVIDER_SITE_OTHER): Payer: BLUE CROSS/BLUE SHIELD

## 2015-09-11 ENCOUNTER — Other Ambulatory Visit: Payer: BLUE CROSS/BLUE SHIELD | Admitting: Internal Medicine

## 2015-09-11 DIAGNOSIS — R7309 Other abnormal glucose: Secondary | ICD-10-CM

## 2015-09-11 DIAGNOSIS — R7303 Prediabetes: Secondary | ICD-10-CM

## 2015-09-11 DIAGNOSIS — I10 Essential (primary) hypertension: Secondary | ICD-10-CM

## 2015-09-11 LAB — BASIC METABOLIC PANEL
BUN: 12 mg/dL (ref 6–23)
CHLORIDE: 103 meq/L (ref 96–112)
CO2: 28 mEq/L (ref 19–32)
Calcium: 9.6 mg/dL (ref 8.4–10.5)
Creatinine, Ser: 0.59 mg/dL (ref 0.40–1.20)
GFR: 116.88 mL/min (ref >60.00)
GLUCOSE: 131 mg/dL — AB (ref 70–99)
POTASSIUM: 4.2 meq/L (ref 3.5–5.1)
Sodium: 139 mEq/L (ref 135–145)

## 2015-09-11 LAB — HEMOGLOBIN A1C: HEMOGLOBIN A1C: 6.5 % (ref 4.6–6.5)

## 2015-09-18 ENCOUNTER — Encounter: Payer: Self-pay | Admitting: Internal Medicine

## 2015-09-18 ENCOUNTER — Ambulatory Visit (INDEPENDENT_AMBULATORY_CARE_PROVIDER_SITE_OTHER): Payer: BLUE CROSS/BLUE SHIELD | Admitting: Internal Medicine

## 2015-09-18 VITALS — BP 118/84 | Temp 98.8°F | Wt 208.6 lb

## 2015-09-18 DIAGNOSIS — I10 Essential (primary) hypertension: Secondary | ICD-10-CM

## 2015-09-18 DIAGNOSIS — R7303 Prediabetes: Secondary | ICD-10-CM

## 2015-09-18 DIAGNOSIS — Z23 Encounter for immunization: Secondary | ICD-10-CM | POA: Diagnosis not present

## 2015-09-18 DIAGNOSIS — E785 Hyperlipidemia, unspecified: Secondary | ICD-10-CM

## 2015-09-18 DIAGNOSIS — Z6835 Body mass index (BMI) 35.0-35.9, adult: Secondary | ICD-10-CM | POA: Diagnosis not present

## 2015-09-18 NOTE — Patient Instructions (Signed)
Intensify lifestyle interventions. Will be referred .   To nutrition.   Plan  Wellness and a1c   In 6 months

## 2015-09-18 NOTE — Progress Notes (Signed)
Pre visit review using our clinic review tool, if applicable. No additional management support is needed unless otherwise documented below in the visit note.  Chief Complaint  Patient presents with  . Follow-up    HPI: Nicole Gonzalez 44 y.o.Began metformin not until July had fatigue and nausea at onset.   And go better .  Now   weight   Not getting steps  Office changes to "paperless" 9 hours in front of screen  Working on it   [place] had testeing  Breast cancer gene no specific dx  One marker of unkown significance ? atm Advice just to screen earlier    ie 40 instead of 50   ROS: See pertinent positives and negatives per HPI.  Past Medical History  Diagnosis Date  . Hyperlipidemia     [id number] trx   . History of gestational diabetes     x2  . Anemia      better after hysterectomy  . Fracture of lower leg      fall 2011  . History of basal cell carcinoma excision   . DIABETES MELLITUS, GESTATIONAL, HX OF 06/02/2009    Qualifier: Diagnosis of  By: Regis Bill MD, Standley Brooking   . Increased frequency of headaches 03/15/2012  . UNSPECIFIED TACHYCARDIA 09/01/2010    Qualifier: Diagnosis of  By: Charlett Blake MD, Erline Levine    . HTN (hypertension)   . Obesity   . GERD (gastroesophageal reflux disease)   . Seizures ([place])     as an infant d/t fever    Family History  Problem Relation Age of Onset  . Melanoma Father     x 3 times  . Hypertension Father   . Diabetes Maternal Grandmother   . Hyperlipidemia Father   . Squamous cell carcinoma Father   . Arthritis Brother      Psoriatic  . Pancreatic cancer Paternal Grandmother   . Colon polyps Father   . Hyperlipidemia Maternal Grandmother   . Heart disease Father   . Heart disease Paternal Grandfather   . Heart disease Maternal Grandmother     Social History   Social History  . Marital Status: Married    Spouse Name: N/A  . Number of Children: 2  . Years of Education: N/A   Occupational History  . Agricultural consultant   .     Social  History Main Topics  . Smoking status: Never Smoker   . Smokeless tobacco: Never Used  . Alcohol Use: No  . Drug Use: No  . Sexual Activity: Not Asked   Other Topics Concern  . 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    Nonsmoker married    Outpatient Prescriptions Prior to Visit  Medication Sig Dispense Refill  . levocetirizine (XYZAL) 5 MG tablet Take 5 mg by mouth every evening.     Marland Kitchen lisinopril (PRINIVIL,ZESTRIL) 10 MG tablet TAKE 1 BY MOUTH DAILY FOR HIGH BLOOD PRESSURE 90 tablet 1  . metFORMIN (GLUCOPHAGE-XR) 500 MG 24 hr tablet TAKE 1 BY MOUTH DAILY WITH BREAKFAST 90 tablet 0  . montelukast (SINGULAIR) 10 MG tablet Take 10 mg by mouth daily.     Marland Kitchen omeprazole (PRILOSEC) 20 MG capsule Take 20 mg by mouth daily.     Norvel Richards  80 MCG/ACT AERS Place 2 sprays into the nose daily.     . simvastatin (ZOCOR) 40 MG tablet TAKE 1 BY MOUTH DAILY AT BEDTIME 90 tablet 1   No facility-administered medications prior to visit.     EXAM:  BP 118/84 mmHg  Temp(Src) 98.8 F (37.1 C) (Oral)  Wt 208 lb 9.6 oz (94.62 kg)  Body mass index is 35.79 kg/(m^2).  GENERAL: vitals reviewed and listed above, alert, oriented, appears well hydrated and in no acute distress PSYCH: pleasant and cooperative, no obvious depression or anxiety Lab Results  Component Value Date   WBC 8.3 03/19/2015   HGB 14.6 03/19/2015   HCT 43.0 03/19/2015   PLT 224.0 03/19/2015   GLUCOSE 131* 09/11/2015   CHOL 161 03/19/2015   TRIG 59.0 03/19/2015   HDL 52.90 03/19/2015   LDLDIRECT 172.4 10/02/2007   LDLCALC 96 03/19/2015   ALT 21 03/19/2015   AST 14 03/19/2015   NA 139 09/11/2015   K 4.2 09/11/2015   CL 103 09/11/2015   CREATININE 0.59 09/11/2015   BUN 12 09/11/2015   CO2 28 09/11/2015   TSH 2.72 03/19/2015   HGBA1C 6.5 09/11/2015   Wt Readings from Last 3 Encounters:  09/18/15 208  lb 9.6 oz (94.62 kg)  03/27/15 204 lb (92.534 kg)  10/16/14 198 lb (89.812 kg)    ASSESSMENT AND PLAN:  Discussed the following assessment and plan:  Prediabetes - Plan: Amb ref to Medical Nutrition Therapy-MNT  BMI 35.0-35.9,adult - Plan: Amb ref to Medical Nutrition Therapy-[facility]  Hyperlipidemia  Essential hypertension  Need for prophylactic vaccination and inoculation against influenza - Plan: Flu Vaccine QUAD 36+ mos PF IM (Fluarix & Fluzone Quad PF) continue meds   Refer nutrition to help  Wellness  6 months with labs  Total visit 33mns > 50% spent counseling and coordinating care as indicated in above note and in instructions to patient .   -Patient advised to return or notify health care team  if symptoms worsen ,persist or new concerns arise.  Patient Instructions  Intensify lifestyle interventions. Will be referred .   To nutrition.   Plan  Wellness and a1c   In 6 months        WStandley Brooking Panosh M.D.

## 2015-10-15 ENCOUNTER — Other Ambulatory Visit: Payer: Self-pay | Admitting: Internal Medicine

## 2015-10-15 NOTE — Telephone Encounter (Signed)
Sent to the pharmacy by e-scribe. 

## 2015-12-31 LAB — HM DIABETES EYE EXAM

## 2016-01-07 ENCOUNTER — Telehealth: Payer: Self-pay | Admitting: Internal Medicine

## 2016-01-07 NOTE — Telephone Encounter (Signed)
Patient was at the eye doctor last week and had calcium build up in her eye. Eye doctor suggested multi vitamins and patient to make that this did not interfere with her medications.

## 2016-01-10 NOTE — Telephone Encounter (Signed)
Need more information details of this situation to answer  Dx  ?  Eye doc can send  Copy of note for review  Name of vitamins and reason to take  And dosing.

## 2016-01-11 NOTE — Telephone Encounter (Signed)
dont know why  They would order vitamins but   dont see danger   If just a regular adult multivitamin (with no megadoses ) something like one a day)

## 2016-01-11 NOTE — Telephone Encounter (Signed)
Spoke to the pt.  Eye doc did not give her a dx.  Said she had calcium deposits in her eyes and said this was normal due to age.   She stated that eye doc did not think calcium was due to diabetes.  Advised that she take OTC multiple vitamin.  Advised pt to call and have note faxed.

## 2016-01-12 ENCOUNTER — Encounter: Payer: Self-pay | Admitting: Family Medicine

## 2016-01-12 NOTE — Telephone Encounter (Signed)
Pt notified ok to take OTC multi vitamin without megadoses.

## 2016-02-01 ENCOUNTER — Other Ambulatory Visit: Payer: Self-pay | Admitting: Internal Medicine

## 2016-02-02 NOTE — Telephone Encounter (Signed)
Sent to the pharmacy by e-scribe.  Pt has upcoming cpx appt 03/29/16.

## 2016-03-22 ENCOUNTER — Other Ambulatory Visit (INDEPENDENT_AMBULATORY_CARE_PROVIDER_SITE_OTHER): Payer: BLUE CROSS/BLUE SHIELD

## 2016-03-22 DIAGNOSIS — Z0000 Encounter for general adult medical examination without abnormal findings: Secondary | ICD-10-CM

## 2016-03-22 LAB — LIPID PANEL
Cholesterol: 175 mg/dL (ref 0–200)
HDL: 51.6 mg/dL (ref >39.00)
LDL Cholesterol: 107 mg/dL — ABNORMAL HIGH (ref 0–99)
NONHDL: 123.76
TRIGLYCERIDES: 84 mg/dL (ref 0.0–149.0)
Total CHOL/HDL Ratio: 3
VLDL: 16.8 mg/dL (ref 0.0–40.0)

## 2016-03-22 LAB — HEPATIC FUNCTION PANEL
ALBUMIN: 4.3 g/dL (ref 3.5–5.2)
ALK PHOS: 59 U/L (ref 39–117)
ALT: 28 U/L (ref 0–35)
AST: 20 U/L (ref 0–37)
Bilirubin, Direct: 0.1 mg/dL (ref 0.0–0.3)
Total Bilirubin: 0.7 mg/dL (ref 0.2–1.2)
Total Protein: 7 g/dL (ref 6.0–8.3)

## 2016-03-22 LAB — MICROALBUMIN / CREATININE URINE RATIO
CREATININE, U: 125.8 mg/dL
MICROALB/CREAT RATIO: 0.9 mg/g (ref 0.0–30.0)
Microalb, Ur: 1.1 mg/dL (ref 0.0–1.9)

## 2016-03-22 LAB — BASIC METABOLIC PANEL
BUN: 17 mg/dL (ref 6–23)
CALCIUM: 9.7 mg/dL (ref 8.4–10.5)
CO2: 28 meq/L (ref 19–32)
CREATININE: 0.59 mg/dL (ref 0.40–1.20)
Chloride: 102 mEq/L (ref 96–112)
GFR: 116.61 mL/min (ref >60.00)
GLUCOSE: 153 mg/dL — AB (ref 70–99)
Potassium: 4.1 mEq/L (ref 3.5–5.1)
Sodium: 137 mEq/L (ref 135–145)

## 2016-03-22 LAB — CBC WITH DIFFERENTIAL/PLATELET
BASOS ABS: 0 10*3/uL (ref 0.0–0.1)
BASOS PCT: 0.3 % (ref 0.0–3.0)
EOS ABS: 0.1 10*3/uL (ref 0.0–0.7)
Eosinophils Relative: 1.7 % (ref 0.0–5.0)
HCT: 41.2 % (ref 36.0–46.0)
Hemoglobin: 13.9 g/dL (ref 12.0–15.0)
LYMPHS ABS: 2 10*3/uL (ref 0.7–4.0)
Lymphocytes Relative: 24.4 % (ref 12.0–46.0)
MCHC: 33.9 g/dL (ref 30.0–36.0)
MCV: 85.4 fl (ref 78.0–100.0)
MONOS PCT: 5.9 % (ref 3.0–12.0)
Monocytes Absolute: 0.5 10*3/uL (ref 0.1–1.0)
NEUTROS ABS: 5.5 10*3/uL (ref 1.4–7.7)
NEUTROS PCT: 67.7 % (ref 43.0–77.0)
PLATELETS: 220 10*3/uL (ref 150.0–400.0)
RBC: 4.82 Mil/uL (ref 3.87–5.11)
RDW: 13 % (ref 11.5–15.5)
WBC: 8.2 10*3/uL (ref 4.0–10.5)

## 2016-03-22 LAB — TSH: TSH: 2.77 u[IU]/mL (ref 0.35–4.50)

## 2016-03-22 LAB — HEMOGLOBIN A1C: Hgb A1c MFr Bld: 7.1 % — ABNORMAL HIGH (ref 4.6–6.5)

## 2016-03-28 NOTE — Progress Notes (Signed)
Pre visit review using our clinic review tool, if applicable. No additional management support is needed unless otherwise documented below in the visit note.  Chief Complaint  Patient presents with  . Annual Exam    blood sugar  dm managment    HPI: Patient  Nicole Gonzalez  45 y.o. comes in today for Preventive Health Care visit  And Chronic disease management BG   Lived in hotel food for a while after October.  Exercising steps.  Taking  566m per day   Loose stools in afternoon.  Urgency .  Not every day .  BP  Doing good   LIPIDS on meds  Allergies  :   Nasal steroids   Derm surveillance for skin cancer Bro  Neg genetic testing results  ses GYNE for fu  Post hyst abnormal cells  Health Maintenance  Topic Date Due  . PAP SMEAR  03/26/2016  . FOOT EXAM  03/26/2016  . HIV Screening  03/29/2017 (Originally 03/06/1986)  . INFLUENZA VACCINE  07/12/2016  . HEMOGLOBIN A1C  09/21/2016  . OPHTHALMOLOGY EXAM  12/30/2016  . COLONOSCOPY  05/03/2018  . TETANUS/TDAP  11/18/2018  . PNEUMOCOCCAL POLYSACCHARIDE VACCINE (2) 03/26/2020   Health Maintenance Review LIFESTYLE:  Exercise:   Over  1000 0 steps  Tobacco/ETS:no Alcohol:  no Sugar beverages: ocass grape juice  Sleep:  about 6-7  Drug use: no    ROS:  GEN/ HEENT: No fever, significant weight changes sweats headaches vision problems hearing changes, CV/ PULM; No chest pain shortness of breath cough, syncope,edema  change in exercise tolerance. GI /GU: No adominal pain, vomiting, change in bowel habits. No blood in the stool. No significant GU symptoms. SKIN/HEME: ,no acute skin rashes suspicious lesions or bleeding. No lymphadenopathy, nodules, masses.  NEURO/ PSYCH:  No neurologic signs such as weakness numbness. No depression anxiety. IMM/ Allergy: No unusual infections.  Allergy .   REST of 12 system review negative except as per HPI   Past Medical History  Diagnosis Date  . Hyperlipidemia     [id number] trx   .  History of gestational diabetes     x2  . Anemia      better after hysterectomy  . Fracture of lower leg      fall 2011  . History of basal cell carcinoma excision   . DIABETES MELLITUS, GESTATIONAL, HX OF 06/02/2009    Qualifier: Diagnosis of  By: PRegis BillMD, WStandley Brooking  . Increased frequency of headaches 03/15/2012  . UNSPECIFIED TACHYCARDIA 09/01/2010    Qualifier: Diagnosis of  By: BCharlett BlakeMD, SErline Levine   . HTN (hypertension)   . Obesity   . GERD (gastroesophageal reflux disease)   . [place]ures ([place]     as an [place] d/t fever    Past Surgical History  Procedure Laterality Date  . Tonsillectomy    . Abdominal hysterectomy  06/29/2010     question had abnormal cells on Path, fibroids  . Basal cell carcinoma excision      Family History  Problem Relation Age of Onset  . Melanoma Father     x 3 times  . Hypertension Father   . Diabetes Maternal Grandmother   . Hyperlipidemia Father   . Squamous cell carcinoma Father   . Arthritis Brother      Psoriatic  . Pancreatic cancer Paternal Grandmother   . Colon polyps Father   . Hyperlipidemia Maternal Grandmother   . Heart disease Father   .  Heart disease Paternal Grandfather   . Heart disease Maternal Grandmother     Social History   Social History  . Marital Status: Married    Spouse Name: N/A  . Number of Children: 2  . Years of Education: N/A   Occupational History  . Agricultural consultant   .     Social History Main Topics  . Smoking status: Never Smoker   . Smokeless tobacco: Never Used  . Alcohol Use: No  . Drug Use: No  . Sexual Activity: Not Asked   Other Topics Concern  . 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    Nonsmoker married    Outpatient Prescriptions Prior to Visit  Medication Sig Dispense Refill  . fexofenadine (ALLEGRA) 180 MG tablet Take 180 mg by mouth daily. In the  morning    . levocetirizine (XYZAL) 5 MG tablet Take 5 mg by mouth every evening.     Marland Kitchen lisinopril (PRINIVIL,ZESTRIL) 10 MG tablet TAKE 1 BY MOUTH DAILY FOR HIGH BLOOD PRESSURE 90 tablet 1  . metFORMIN (GLUCOPHAGE-XR) 500 MG 24 hr tablet TAKE 1 BY MOUTH DAILY WITH BREAKFAST 90 tablet 0  . montelukast (SINGULAIR) 10 MG tablet Take 10 mg by mouth daily.     Marland Kitchen omeprazole (PRILOSEC) 20 MG capsule Take 20 mg by mouth daily.     Norvel Richards 80 MCG/ACT AERS Place 2 sprays into the nose daily.     . simvastatin (ZOCOR) 40 MG tablet TAKE 1 BY MOUTH DAILY AT BEDTIME 90 tablet 1  . metFORMIN (GLUCOPHAGE-XR) 500 MG 24 hr tablet TAKE 1 BY MOUTH DAILY WITH BREAKFAST 90 tablet 0   No facility-administered medications prior to visit.     EXAM:  BP 106/64 mmHg  Temp(Src) 98.2 F (36.8 C) (Oral)  Ht 5' 3.5" (1.613 m)  Wt 208 lb 12.8 oz (94.711 kg)  BMI 36.40 kg/m2  Body mass index is 36.4 kg/(m^2).  Physical Exam: Vital signs reviewed RWE:RXVQ is a well-developed well-nourished alert cooperative    who appearsr stated age in no acute distress.  HEENT: normocephalic atraumatic , Eyes: PERRL EOM's full, conjunctiva clear, Nares: paten,t no deformity discharge or tenderness., Ears: no deformity EAC's clear TMs with normal landmarks. Mouth: clear OP, no lesions, edema.  Moist mucous membranes. Dentition in adequate repair. NECK: supple without masses, thyromegaly or bruits.Breast: normal by inspection . No dimpling, discharge, masses, tenderness or discharge . CHEST/PULM:  Clear to auscultation and percussion breath sounds equal no wheeze , rales or rhonchi. No chest wall deformities or tenderness. CV: PMI is nondisplaced, S1 S2 no gallops, murmurs, rubs. Peripheral pulses are full without delay.No JVD .  ABDOMEN: Bowel sounds normal nontender  No guard or rebound, no hepato splenomegal no CVA tenderness.  Extremtities:  No clubbing cyanosis or edema, no acute joint swelling or redness no focal  atrophy NEURO:  Oriented x3, cranial nerves 3-12 appear to be intact, no obvious focal weakness,gait within normal limits no abnormal reflexes or asymmetrical SKIN: No acute rashes normal turgor, color, no bruising or petechiae. PSYCH: Oriented, good eye contact, no obvious depression anxiety, cognition and judgment appear normal. LN: no cervical axillary inguinal adenopathy Diabetic Foot Exam - Simple   Simple Foot Form  Diabetic Foot exam was performed with  the following findings:  Yes 03/29/2016  9:45 AM  Visual Inspection  No deformities, no ulcerations, no other skin breakdown bilaterally:  Yes  Sensation Testing  Intact to touch and monofilament testing bilaterally:  Yes  Pulse Check  Posterior Tibialis and Dorsalis pulse intact bilaterally:  Yes  Comments       Lab Results  Component Value Date   WBC 8.2 03/22/2016   HGB 13.9 03/22/2016   HCT 41.2 03/22/2016   PLT 220.0 03/22/2016   GLUCOSE 153* 03/22/2016   CHOL 175 03/22/2016   TRIG 84.0 03/22/2016   HDL 51.60 03/22/2016   LDLDIRECT 172.4 10/02/2007   LDLCALC 107* 03/22/2016   ALT 28 03/22/2016   AST 20 03/22/2016   NA 137 03/22/2016   K 4.1 03/22/2016   CL 102 03/22/2016   CREATININE 0.59 03/22/2016   BUN 17 03/22/2016   CO2 28 03/22/2016   TSH 2.77 03/22/2016   HGBA1C 7.1* 03/22/2016   MICROALBUR 1.1 03/22/2016    ASSESSMENT AND PLAN:  Discussed the following assessment and plan:  Visit for preventive health examination  Essential hypertension - controlled   Diabetes mellitus without complication ([place]) - Plan: Amb ref to Medical Nutrition Therapy-MNT  Hyperlipidemia - Plan: Amb ref to Medical Nutrition Therapy-MNT  Medication management  Family history of breast cancer in female - neg gen testing  grother  Allergic rhinitis, unspecified allergic rhinitis type  Patient Care Team: Burnis Medin, MD as PCP - General gandhi Azucena Fallen, MD as Attending Physician (Obstetrics and Gynecology) Tiajuana Amass, MD (Allergy) Patient Instructions  Intensify lifestyle interventions. Will do referral to  Nutrition again  .    We can add medication  Such as  jardiance , januvia  evne injectable meds type medications.  Or other . That dont add to weight gain  And can help with sugars control .   rov in 3 months  Can do a1c at the office visit     Health Maintenance, Female Adopting a healthy lifestyle and getting preventive care can go a long way to promote health and wellness. Talk with your health care provider about what schedule of regular examinations is right for you. This is a good chance for you to check in with your provider about disease prevention and staying healthy. In between checkups, there are plenty of things you can do on your own. Experts have done a lot of research about which lifestyle changes and preventive measures are most likely to keep you healthy. Ask your health care provider for more information. WEIGHT AND DIET  Eat a healthy diet  Be sure to include plenty of vegetables, fruits, low-fat dairy products, and lean protein.  Do not eat a lot of foods high in solid fats, added sugars, or salt.  Get regular exercise. This is one of the most important things you can do for your health.  Most adults should exercise for at least 150 minutes each week. The exercise should increase your heart rate and make you sweat (moderate-intensity exercise).  Most adults should also do strengthening exercises at least twice a week. This is in addition to the moderate-intensity exercise.  Maintain a healthy weight  Body mass index (BMI) is a measurement that can be used to identify possible weight problems. It estimates body fat based on height and weight. Your health care provider can help determine your BMI and help you achieve or maintain a healthy weight.  For females 72 years of age and older:  A BMI below 18.5 is considered underweight.  A BMI of 18.5 to 24.9 is  normal.  A BMI of 25 to 29.9 is considered overweight.  A BMI of 30 and above is considered obese.  Watch levels of cholesterol and blood lipids  You should start having your blood tested for lipids and cholesterol at 46 years of age, then have this test every 5 years.  You may need to have your cholesterol levels checked more often if:  Your lipid or cholesterol levels are high.  You are older than 46 years of age.  You are at high risk for heart disease.  CANCER SCREENING   Lung Cancer  Lung cancer screening is recommended for adults 71-26 years old who are at high risk for lung cancer because of a history of smoking.  A yearly low-dose CT scan of the lungs is recommended for people who:  Currently smoke.  Have quit within the past 15 years.  Have at least a 30-pack-year history of smoking. A pack year is smoking an average of one pack of cigarettes a day for 1 year.  Yearly screening should continue until it has been 15 years since you quit.  Yearly screening should stop if you develop a health problem that would prevent you from having lung cancer treatment.  Breast Cancer  Practice breast self-awareness. This means understanding how your breasts normally appear and feel.  It also means doing regular breast self-exams. Let your health care provider know about any changes, no matter how small.  If you are in your 20s or 30s, you should have a clinical breast exam (CBE) by a health care provider every 1-3 years as part of a regular health exam.  If you are 72 or older, have a CBE every year. Also consider having a breast X-ray (mammogram) every year.  If you have a family history of breast cancer, talk to your health care provider about genetic screening.  If you are at high risk for breast cancer, talk to your health care provider about having an MRI and a mammogram every year.  Breast cancer gene (BRCA) assessment is recommended for women who have family members  with BRCA-related cancers. BRCA-related cancers include:  Breast.  Ovarian.  Tubal.  Peritoneal cancers.  Results of the assessment will determine the need for genetic counseling and BRCA1 and BRCA2 testing. Cervical Cancer Your health care provider may recommend that you be screened regularly for cancer of the pelvic organs (ovaries, uterus, and vagina). This screening involves a pelvic examination, including checking for microscopic changes to the surface of your cervix (Pap test). You may be encouraged to have this screening done every 3 years, beginning at age 40.  For women ages 34-65, health care providers may recommend pelvic exams and Pap testing every 3 years, or they may recommend the Pap and pelvic exam, combined with testing for human papilloma virus (HPV), every 5 years. Some types of HPV increase your risk of cervical cancer. Testing for HPV may also be done on women of any age with unclear Pap test results.  Other health care providers may not recommend any screening for nonpregnant women who are considered low risk for pelvic cancer and who do not have symptoms. Ask your health care provider if a screening pelvic exam is right for you.  If you have had past treatment for cervical cancer or a condition that could lead to cancer, you need Pap tests and screening for cancer for at least  20 years after your treatment. If Pap tests have been discontinued, your risk factors (such as having a new sexual partner) need to be reassessed to determine if screening should resume. Some women have medical problems that increase the chance of getting cervical cancer. In these cases, your health care provider may recommend more frequent screening and Pap tests. Colorectal Cancer  This type of cancer can be detected and often prevented.  Routine colorectal cancer screening usually begins at 46 years of age and continues through 46 years of age.  Your health care provider may recommend  screening at an earlier age if you have risk factors for colon cancer.  Your health care provider may also recommend using home test kits to check for hidden blood in the stool.  A small camera at the end of a tube can be used to examine your colon directly (sigmoidoscopy or colonoscopy). This is done to check for the earliest forms of colorectal cancer.  Routine screening usually begins at age 76.  Direct examination of the colon should be repeated every 5-10 years through 45 years of age. However, you may need to be screened more often if early forms of precancerous polyps or small growths are found. Skin Cancer  Check your skin from head to toe regularly.  Tell your health care provider about any new moles or changes in moles, especially if there is a change in a mole's shape or color.  Also tell your health care provider if you have a mole that is larger than the size of a pencil eraser.  Always use sunscreen. Apply sunscreen liberally and repeatedly throughout the day.  Protect yourself by wearing long sleeves, pants, a wide-brimmed hat, and sunglasses whenever you are outside. HEART DISEASE, DIABETES, AND HIGH BLOOD PRESSURE   High blood pressure causes heart disease and increases the risk of stroke. High blood pressure is more likely to develop in:  People who have blood pressure in the high end of the normal range (130-139/85-89 mm Hg).  People who are overweight or obese.  People who are African American.  If you are 58-20 years of age, have your blood pressure checked every 3-5 years. If you are 29 years of age or older, have your blood pressure checked every year. You should have your blood pressure measured twice--once when you are at a hospital or clinic, and once when you are not at a hospital or clinic. Record the average of the two measurements. To check your blood pressure when you are not at a hospital or clinic, you can use:  An automated blood pressure machine at a  pharmacy.  A home blood pressure monitor.  If you are between 57 years and 35 years old, ask your health care provider if you should take aspirin to prevent strokes.  Have regular diabetes screenings. This involves taking a blood sample to check your fasting blood sugar level.  If you are at a normal weight and have a low risk for diabetes, have this test once every three years after 46 years of age.  If you are overweight and have a high risk for diabetes, consider being tested at a younger age or more often. PREVENTING INFECTION  Hepatitis B  If you have a higher risk for hepatitis B, you should be screened for this virus. You are considered at high risk for hepatitis B if:  You were born in a country where hepatitis B is common. Ask your health care provider which countries are  considered high risk.  Your parents were born in a high-risk country, and you have not been immunized against hepatitis B (hepatitis B vaccine).  You have HIV or AIDS.  You use needles to inject street drugs.  You live with someone who has hepatitis B.  You have had sex with someone who has hepatitis B.  You get hemodialysis treatment.  You take certain medicines for conditions, including cancer, organ transplantation, and autoimmune conditions. Hepatitis C  Blood testing is recommended for:  Everyone born from [birth year] through 1966.  Anyone with known risk factors for hepatitis C. Sexually transmitted infections (STIs)  You should be screened for sexually transmitted infections (STIs) including gonorrhea and chlamydia if:  You are sexually active and are younger than 46 years of age.  You are older than 46 years of age and your health care provider tells you that you are at risk for this type of infection.  Your sexual activity has changed since you were last screened and you are at an increased risk for chlamydia or gonorrhea. Ask your health care provider if you are at risk.  If you do not  have HIV, but are at risk, it may be recommended that you take a prescription medicine daily to prevent HIV infection. This is called pre-exposure prophylaxis (PrEP). You are considered at risk if:  You are sexually active and do not regularly use condoms or know the HIV status of your partner(s).  You take drugs by injection.  You are sexually active with a partner who has HIV. Talk with your health care provider about whether you are at high risk of being infected with HIV. If you choose to begin PrEP, you should first be tested for HIV. You should then be tested every 3 months for as long as you are taking PrEP.  PREGNANCY   If you are premenopausal and you may become pregnant, ask your health care provider about preconception counseling.  If you may become pregnant, take 400 to 800 micrograms (mcg) of folic acid every day.  If you want to prevent pregnancy, talk to your health care provider about birth control (contraception). OSTEOPOROSIS AND MENOPAUSE   Osteoporosis is a disease in which the bones lose minerals and strength with aging. This can result in serious bone fractures. Your risk for osteoporosis can be identified using a bone density scan.  If you are 23 years of age or older, or if you are at risk for osteoporosis and fractures, ask your health care provider if you should be screened.  Ask your health care provider whether you should take a calcium or vitamin D supplement to lower your risk for osteoporosis.  Menopause may have certain physical symptoms and risks.  Hormone replacement therapy may reduce some of these symptoms and risks. Talk to your health care provider about whether hormone replacement therapy is right for you.  HOME CARE INSTRUCTIONS   Schedule regular health, dental, and eye exams.  Stay current with your immunizations.   Do not use any tobacco products including cigarettes, chewing tobacco, or electronic cigarettes.  If you are pregnant, do not  drink alcohol.  If you are breastfeeding, limit how much and how often you drink alcohol.  Limit alcohol intake to no more than 1 drink per day for nonpregnant women. One drink equals 12 ounces of beer, 5 ounces of wine, or 1 ounces of hard liquor.  Do not use street drugs.  Do not share needles.  Ask your health care  provider for help if you need support or information about quitting drugs.  Tell your health care provider if you often feel depressed.  Tell your health care provider if you have ever been abused or do not feel safe at home.   This information is not intended to replace advice given to you by your health care provider. Make sure you discuss any questions you have with your health care provider.   Document Released: 06/13/2011 Document Revised: 12/19/2014 Document Reviewed: 10/30/2013 Elsevier Interactive Patient Education 2016 Conway K. Panosh M.D.

## 2016-03-28 NOTE — Patient Instructions (Addendum)
Intensify lifestyle interventions. Will do referral to  Nutrition again  .    We can add medication  Such as  jardiance , januvia  evne injectable meds type medications.  Or other . That dont add to weight gain  And can help with sugars control .   rov in 3 months  Can do a1c at the office visit     Health Maintenance, Female Adopting a healthy lifestyle and getting preventive care can go a long way to promote health and wellness. Talk with your health care provider about what schedule of regular examinations is right for you. This is a good chance for you to check in with your provider about disease prevention and staying healthy. In between checkups, there are plenty of things you can do on your own. Experts have done a lot of research about which lifestyle changes and preventive measures are most likely to keep you healthy. Ask your health care provider for more information. WEIGHT AND DIET  Eat a healthy diet  Be sure to include plenty of vegetables, fruits, low-fat dairy products, and lean protein.  Do not eat a lot of foods high in solid fats, added sugars, or salt.  Get regular exercise. This is one of the most important things you can do for your health.  Most adults should exercise for at least 150 minutes each week. The exercise should increase your heart rate and make you sweat (moderate-intensity exercise).  Most adults should also do strengthening exercises at least twice a week. This is in addition to the moderate-intensity exercise.  Maintain a healthy weight  Body mass index (BMI) is a measurement that can be used to identify possible weight problems. It estimates body fat based on height and weight. Your health care provider can help determine your BMI and help you achieve or maintain a healthy weight.  For females 21 years of age and older:   A BMI below 18.5 is considered underweight.  A BMI of 18.5 to 24.9 is normal.  A BMI of 25 to 29.9 is considered  overweight.  A BMI of 30 and above is considered obese.  Watch levels of cholesterol and blood lipids  You should start having your blood tested for lipids and cholesterol at 46 years of age, then have this test every 5 years.  You may need to have your cholesterol levels checked more often if:  Your lipid or cholesterol levels are high.  You are older than 46 years of age.  You are at high risk for heart disease.  CANCER SCREENING   Lung Cancer  Lung cancer screening is recommended for adults 1-61 years old who are at high risk for lung cancer because of a history of smoking.  A yearly low-dose CT scan of the lungs is recommended for people who:  Currently smoke.  Have quit within the past 15 years.  Have at least a 30-pack-year history of smoking. A pack year is smoking an average of one pack of cigarettes a day for 1 year.  Yearly screening should continue until it has been 15 years since you quit.  Yearly screening should stop if you develop a health problem that would prevent you from having lung cancer treatment.  Breast Cancer  Practice breast self-awareness. This means understanding how your breasts normally appear and feel.  It also means doing regular breast self-exams. Let your health care provider know about any changes, no matter how small.  If you are in your 7s  or 29s, you should have a clinical breast exam (CBE) by a health care provider every 1-3 years as part of a regular health exam.  If you are 54 or older, have a CBE every year. Also consider having a breast X-ray (mammogram) every year.  If you have a family history of breast cancer, talk to your health care provider about genetic screening.  If you are at high risk for breast cancer, talk to your health care provider about having an MRI and a mammogram every year.  Breast cancer gene (BRCA) assessment is recommended for women who have family members with BRCA-related cancers. BRCA-related  cancers include:  Breast.  Ovarian.  Tubal.  Peritoneal cancers.  Results of the assessment will determine the need for genetic counseling and BRCA1 and BRCA2 testing. Cervical Cancer Your health care provider may recommend that you be screened regularly for cancer of the pelvic organs (ovaries, uterus, and vagina). This screening involves a pelvic examination, including checking for microscopic changes to the surface of your cervix (Pap test). You may be encouraged to have this screening done every 3 years, beginning at age 41.  For women ages 49-65, health care providers may recommend pelvic exams and Pap testing every 3 years, or they may recommend the Pap and pelvic exam, combined with testing for human papilloma virus (HPV), every 5 years. Some types of HPV increase your risk of cervical cancer. Testing for HPV may also be done on women of any age with unclear Pap test results.  Other health care providers may not recommend any screening for nonpregnant women who are considered low risk for pelvic cancer and who do not have symptoms. Ask your health care provider if a screening pelvic exam is right for you.  If you have had past treatment for cervical cancer or a condition that could lead to cancer, you need Pap tests and screening for cancer for at least 20 years after your treatment. If Pap tests have been discontinued, your risk factors (such as having a new sexual partner) need to be reassessed to determine if screening should resume. Some women have medical problems that increase the chance of getting cervical cancer. In these cases, your health care provider may recommend more frequent screening and Pap tests. Colorectal Cancer  This type of cancer can be detected and often prevented.  Routine colorectal cancer screening usually begins at 46 years of age and continues through 46 years of age.  Your health care provider may recommend screening at an earlier age if you have risk  factors for colon cancer.  Your health care provider may also recommend using home test kits to check for hidden blood in the stool.  A small camera at the end of a tube can be used to examine your colon directly (sigmoidoscopy or colonoscopy). This is done to check for the earliest forms of colorectal cancer.  Routine screening usually begins at age 19.  Direct examination of the colon should be repeated every 5-10 years through 46 years of age. However, you may need to be screened more often if early forms of precancerous polyps or small growths are found. Skin Cancer  Check your skin from head to toe regularly.  Tell your health care provider about any new moles or changes in moles, especially if there is a change in a mole's shape or color.  Also tell your health care provider if you have a mole that is larger than the size of a pencil eraser.  Always use sunscreen. Apply sunscreen liberally and repeatedly throughout the day.  Protect yourself by wearing long sleeves, pants, a wide-brimmed hat, and sunglasses whenever you are outside. HEART DISEASE, DIABETES, AND HIGH BLOOD PRESSURE   High blood pressure causes heart disease and increases the risk of stroke. High blood pressure is more likely to develop in:  People who have blood pressure in the high end of the normal range (130-139/85-89 mm Hg).  People who are overweight or obese.  People who are African American.  If you are 13-40 years of age, have your blood pressure checked every 3-5 years. If you are 37 years of age or older, have your blood pressure checked every year. You should have your blood pressure measured twice--once when you are at a hospital or clinic, and once when you are not at a hospital or clinic. Record the average of the two measurements. To check your blood pressure when you are not at a hospital or clinic, you can use:  An automated blood pressure machine at a pharmacy.  A home blood pressure  monitor.  If you are between 39 years and 80 years old, ask your health care provider if you should take aspirin to prevent strokes.  Have regular diabetes screenings. This involves taking a blood sample to check your fasting blood sugar level.  If you are at a normal weight and have a low risk for diabetes, have this test once every three years after 46 years of age.  If you are overweight and have a high risk for diabetes, consider being tested at a younger age or more often. PREVENTING INFECTION  Hepatitis B  If you have a higher risk for hepatitis B, you should be screened for this virus. You are considered at high risk for hepatitis B if:  You were born in a country where hepatitis B is common. Ask your health care provider which countries are considered high risk.  Your parents were born in a high-risk country, and you have not been immunized against hepatitis B (hepatitis B vaccine).  You have HIV or AIDS.  You use needles to inject street drugs.  You live with someone who has hepatitis B.  You have had sex with someone who has hepatitis B.  You get hemodialysis treatment.  You take certain medicines for conditions, including cancer, organ transplantation, and autoimmune conditions. Hepatitis C  Blood testing is recommended for:  Everyone born from [birth year] through 1966.  Anyone with known risk factors for hepatitis C. Sexually transmitted infections (STIs)  You should be screened for sexually transmitted infections (STIs) including gonorrhea and chlamydia if:  You are sexually active and are younger than 46 years of age.  You are older than 46 years of age and your health care provider tells you that you are at risk for this type of infection.  Your sexual activity has changed since you were last screened and you are at an increased risk for chlamydia or gonorrhea. Ask your health care provider if you are at risk.  If you do not have HIV, but are at risk, it may be  recommended that you take a prescription medicine daily to prevent HIV infection. This is called pre-exposure prophylaxis (PrEP). You are considered at risk if:  You are sexually active and do not regularly use condoms or know the HIV status of your partner(s).  You take drugs by injection.  You are sexually active with a partner who has HIV. Talk with your health  care provider about whether you are at high risk of being infected with HIV. If you choose to begin PrEP, you should first be tested for HIV. You should then be tested every 3 months for as long as you are taking PrEP.  PREGNANCY   If you are premenopausal and you may become pregnant, ask your health care provider about preconception counseling.  If you may become pregnant, take 400 to 800 micrograms (mcg) of folic acid every day.  If you want to prevent pregnancy, talk to your health care provider about birth control (contraception). OSTEOPOROSIS AND MENOPAUSE   Osteoporosis is a disease in which the bones lose minerals and strength with aging. This can result in serious bone fractures. Your risk for osteoporosis can be identified using a bone density scan.  If you are 34 years of age or older, or if you are at risk for osteoporosis and fractures, ask your health care provider if you should be screened.  Ask your health care provider whether you should take a calcium or vitamin D supplement to lower your risk for osteoporosis.  Menopause may have certain physical symptoms and risks.  Hormone replacement therapy may reduce some of these symptoms and risks. Talk to your health care provider about whether hormone replacement therapy is right for you.  HOME CARE INSTRUCTIONS   Schedule regular health, dental, and eye exams.  Stay current with your immunizations.   Do not use any tobacco products including cigarettes, chewing tobacco, or electronic cigarettes.  If you are pregnant, do not drink alcohol.  If you are  breastfeeding, limit how much and how often you drink alcohol.  Limit alcohol intake to no more than 1 drink per day for nonpregnant women. One drink equals 12 ounces of beer, 5 ounces of wine, or 1 ounces of hard liquor.  Do not use street drugs.  Do not share needles.  Ask your health care provider for help if you need support or information about quitting drugs.  Tell your health care provider if you often feel depressed.  Tell your health care provider if you have ever been abused or do not feel safe at home.   This information is not intended to replace advice given to you by your health care provider. Make sure you discuss any questions you have with your health care provider.   Document Released: 06/13/2011 Document Revised: 12/19/2014 Document Reviewed: 10/30/2013 Elsevier Interactive Patient Education [employer].

## 2016-03-29 ENCOUNTER — Encounter: Payer: Self-pay | Admitting: Internal Medicine

## 2016-03-29 ENCOUNTER — Ambulatory Visit (INDEPENDENT_AMBULATORY_CARE_PROVIDER_SITE_OTHER): Payer: BLUE CROSS/BLUE SHIELD | Admitting: Internal Medicine

## 2016-03-29 VITALS — BP 106/64 | Temp 98.2°F | Ht 63.5 in | Wt 208.8 lb

## 2016-03-29 DIAGNOSIS — Z808 Family history of malignant neoplasm of other organs or systems: Secondary | ICD-10-CM

## 2016-03-29 DIAGNOSIS — I10 Essential (primary) hypertension: Secondary | ICD-10-CM | POA: Diagnosis not present

## 2016-03-29 DIAGNOSIS — E785 Hyperlipidemia, unspecified: Secondary | ICD-10-CM

## 2016-03-29 DIAGNOSIS — E119 Type 2 diabetes mellitus without complications: Secondary | ICD-10-CM | POA: Diagnosis not present

## 2016-03-29 DIAGNOSIS — Z79899 Other long term (current) drug therapy: Secondary | ICD-10-CM | POA: Diagnosis not present

## 2016-03-29 DIAGNOSIS — Z0000 Encounter for general adult medical examination without abnormal findings: Secondary | ICD-10-CM | POA: Diagnosis not present

## 2016-03-29 DIAGNOSIS — J309 Allergic rhinitis, unspecified: Secondary | ICD-10-CM

## 2016-03-29 DIAGNOSIS — Z803 Family history of malignant neoplasm of breast: Secondary | ICD-10-CM

## 2016-03-29 NOTE — Assessment & Plan Note (Signed)
Can only tolerate 500 per day metformin cause of go se   Will ilsi and  Monitor  sugars  And disc adding other meds    rov 3 months a1c at OV .   Machine sample given today  She will contact insurance about coverage Refer again for dietary nutrition counseling

## 2016-04-06 ENCOUNTER — Encounter: Payer: Self-pay | Admitting: Internal Medicine

## 2016-04-06 ENCOUNTER — Encounter: Payer: BLUE CROSS/BLUE SHIELD | Attending: Internal Medicine | Admitting: Nutrition

## 2016-04-06 DIAGNOSIS — E119 Type 2 diabetes mellitus without complications: Secondary | ICD-10-CM

## 2016-04-06 DIAGNOSIS — E785 Hyperlipidemia, unspecified: Secondary | ICD-10-CM | POA: Diagnosis not present

## 2016-04-06 NOTE — Patient Instructions (Signed)
Walk briskly for 30 min. 5 days/wk Limit meat portions at supper to no more than 4 ounces Limit salad dressings, and butter to lest amounts possible--try lite dressings and whipped butter Test blood sugar before and 2hr. After meals--alternating meals each day. Record meal eaten if 2hr after reading is over 180. Return in one month.

## 2016-04-06 NOTE — Telephone Encounter (Signed)
Ok to do as requested

## 2016-04-06 NOTE — Progress Notes (Signed)
This patient had gestational diabetes with her 2 pregnancies, and has been ranging in the 6.0% HgbA1C.  It has now increased to 7.1%.   Discussed the physiology of diabetes, and the need to decrease her insulin resistance with diet, exercise and waist loss size.  She reported good understanding of this.  She is not testing her blood sugars. She is not exercising.  She wears a device that monitors her steps and tries to get 1000 steps/day but most days does not get to this goal. Her  Diet is: 7AM: bfast: 1 egg, fried in butter with 1-2 pieces of toast, and 1 bacon and glass of milk.  (45 grams of carbs) 10AM: 4-6 Nabs with water 12:30: leftovers from supper or sandwich with bag of chips 3PM: yogurt--regular 'not low fat' 5:30: supper: 6-8 ounces of protein, salad with "lots of dressing", tries to limit carbs to 30 grams with supper meal. 7PM: snack of bag of chips, or ice cream-1 scoop.  Discussed the difference in gestational diabetes diet and typical diabetes diet.   Suggestions were given 1.  3 meals, 1 snack 2.  2 ounces of protein at breakfast.  List of protein choices given to her for this.  2-3 at lunch, and no more than 4 ounces at supper meal. 3.  Limit fried snacks (chips to no more than 1X/day.  4.  Watch fats in diet!  Limit salad dressings and butter to smallest amount possible.   5.  Walk for 30 minutes at a rapid pace.  This can be split into 3  10 min. Intervals.  Do this 5 times/wk. 6.  Goal is weight loss 1-2 pounds per week--leading to 1-2 inch waist loss size. 7.  Return 1 month.  8.  Test blood sugars around one meal--before  And 2hr. Pc.  Goal: less than 110 ac, and less than 170 2hr. After eating.  She will keep a log of meals eaten if the 2hr. pc reading is over 180, and return in one month.  She was given a Bayer contour Next meter, and she tested her blood sugar.  It was 226mg  2hr. pcB of egg sausage and toast.

## 2016-04-07 MED ORDER — BAYER CONTOUR NEXT TEST VI STRP: ORAL_STRIP | Status: DC

## 2016-04-14 ENCOUNTER — Encounter: Payer: Self-pay | Admitting: Internal Medicine

## 2016-04-15 MED ORDER — LANCETS MISC: Status: DC

## 2016-04-21 ENCOUNTER — Other Ambulatory Visit: Payer: Self-pay | Admitting: Internal Medicine

## 2016-04-21 NOTE — Telephone Encounter (Signed)
Sent to the pharmacy by e-scribe. 

## 2016-04-28 ENCOUNTER — Other Ambulatory Visit: Payer: Self-pay

## 2016-04-28 DIAGNOSIS — Z1231 Encounter for screening mammogram for malignant neoplasm of breast: Secondary | ICD-10-CM

## 2016-04-29 ENCOUNTER — Telehealth: Payer: Self-pay | Admitting: Internal Medicine

## 2016-04-29 NOTE — Telephone Encounter (Addendum)
I wasn't aware of that . Please help her assess  To get another RD/nutritionist in her network.  Thanks If Elio Forget is in network refer to her .

## 2016-04-29 NOTE — Telephone Encounter (Signed)
Pt said the dietician she was referred to was considered a hospital visit. She is asking if there is another dietician she can be referred to.    Would like a call back

## 2016-05-02 NOTE — Telephone Encounter (Signed)
Spoke to the pt.  Advised that she call her insurance company to find out which dietician/office is covered.  Will then call back.

## 2016-05-04 ENCOUNTER — Encounter: Payer: BLUE CROSS/BLUE SHIELD | Attending: Internal Medicine | Admitting: Nutrition

## 2016-05-04 VITALS — Wt 204.2 lb

## 2016-05-04 DIAGNOSIS — E119 Type 2 diabetes mellitus without complications: Secondary | ICD-10-CM

## 2016-05-04 DIAGNOSIS — E785 Hyperlipidemia, unspecified: Secondary | ICD-10-CM | POA: Diagnosis not present

## 2016-05-04 NOTE — Patient Instructions (Signed)
Continue exercising Continue meal control and testing 2hr after.  Remember: goal is less than 160.

## 2016-05-04 NOTE — Progress Notes (Signed)
Weight down 4 pounds.  Pt. Eating 3 meals and sometimes on snack. Testing ac and 2hr. pc one meal.  2hour pc reading always less than 50 points high than pre meal. FBSs in last week: 125,151, 148,, 119, 81.  2hr. Pc:133-173.  AcL: 2hr. PcL: 133s, 2hr.pcS: 133-144.    Exercising on stationary bike for 30 min. 5 days/wk. In AM before breakfast.    Gave much praise.  Pt. Reports feeling better with more energy and pleased with weight loss.

## 2016-05-25 ENCOUNTER — Other Ambulatory Visit: Payer: Self-pay | Admitting: Internal Medicine

## 2016-05-25 NOTE — Telephone Encounter (Signed)
Sent to the pharmacy by e-scribe. 

## 2016-06-01 ENCOUNTER — Ambulatory Visit
Admission: RE | Admit: 2016-06-01 | Discharge: 2016-06-01 | Disposition: A | Discharge status: Home / Self Care | Payer: BLUE CROSS/BLUE SHIELD | Source: Ambulatory Visit

## 2016-06-01 ENCOUNTER — Ambulatory Visit
Admission: RE | Admit: 2016-06-01 | Discharge: 2016-06-01 | Disposition: A | Discharge status: Home / Self Care | Payer: BLUE CROSS/BLUE SHIELD | Source: Ambulatory Visit | Attending: Internal Medicine | Admitting: Internal Medicine

## 2016-06-01 ENCOUNTER — Other Ambulatory Visit: Payer: Self-pay | Admitting: Internal Medicine

## 2016-06-01 DIAGNOSIS — Z1231 Encounter for screening mammogram for malignant neoplasm of breast: Secondary | ICD-10-CM

## 2016-06-01 DIAGNOSIS — L814 Other melanin hyperpigmentation: Secondary | ICD-10-CM | POA: Diagnosis not present

## 2016-06-01 DIAGNOSIS — N63 Unspecified lump in unspecified breast: Secondary | ICD-10-CM

## 2016-06-01 DIAGNOSIS — D1801 Hemangioma of skin and subcutaneous tissue: Secondary | ICD-10-CM | POA: Diagnosis not present

## 2016-06-01 DIAGNOSIS — L821 Other seborrheic keratosis: Secondary | ICD-10-CM | POA: Diagnosis not present

## 2016-06-01 DIAGNOSIS — Z85828 Personal history of other malignant neoplasm of skin: Secondary | ICD-10-CM | POA: Diagnosis not present

## 2016-06-28 ENCOUNTER — Ambulatory Visit (INDEPENDENT_AMBULATORY_CARE_PROVIDER_SITE_OTHER): Payer: BLUE CROSS/BLUE SHIELD | Admitting: Internal Medicine

## 2016-06-28 ENCOUNTER — Encounter: Payer: Self-pay | Admitting: Internal Medicine

## 2016-06-28 VITALS — BP 100/60 | Temp 97.8°F | Wt 200.8 lb

## 2016-06-28 DIAGNOSIS — E119 Type 2 diabetes mellitus without complications: Secondary | ICD-10-CM | POA: Diagnosis not present

## 2016-06-28 DIAGNOSIS — I10 Essential (primary) hypertension: Secondary | ICD-10-CM | POA: Diagnosis not present

## 2016-06-28 DIAGNOSIS — Z79899 Other long term (current) drug therapy: Secondary | ICD-10-CM

## 2016-06-28 DIAGNOSIS — Z01419 Encounter for gynecological examination (general) (routine) without abnormal findings: Secondary | ICD-10-CM | POA: Diagnosis not present

## 2016-06-28 DIAGNOSIS — Z6835 Body mass index (BMI) 35.0-35.9, adult: Secondary | ICD-10-CM | POA: Diagnosis not present

## 2016-06-28 LAB — POCT GLYCOSYLATED HEMOGLOBIN (HGB A1C): Hemoglobin A1C: 6.1

## 2016-06-28 NOTE — Progress Notes (Signed)
Pre visit review using our clinic review tool, if applicable. No additional management support is needed unless otherwise documented below in the visit note.  Chief Complaint  Patient presents with  . Follow-up    HPI: Nicole Gonzalez 45 y.o.  Fu new onset  Diabetes  Metformin at [employer] bike most days  And dietary     Changes  after nutrition .    Energy better  With this.   no se  Of plan  ROS: See pertinent positives and negatives per HPI.  Past Medical History  Diagnosis Date  . Hyperlipidemia     [id number] trx   . History of gestational diabetes     x2  . Anemia      better after hysterectomy  . Fracture of lower leg      fall 2011  . History of basal cell carcinoma excision   . DIABETES MELLITUS, GESTATIONAL, HX OF 06/02/2009    Qualifier: Diagnosis of  By: Regis Bill MD, Standley Brooking   . Increased frequency of headaches 03/15/2012  . UNSPECIFIED TACHYCARDIA 09/01/2010    Qualifier: Diagnosis of  By: Charlett Blake MD, Erline Levine    . HTN (hypertension)   . Obesity   . GERD (gastroesophageal reflux disease)   . Seizures ([place])     as an infant d/t fever    Family History  Problem Relation Age of Onset  . Melanoma Father     x 3 times  . Hypertension Father   . Diabetes Maternal Grandmother   . Hyperlipidemia Father   . Squamous cell carcinoma Father   . Arthritis Brother      Psoriatic  . Pancreatic cancer Paternal Grandmother   . Colon polyps Father   . Hyperlipidemia Maternal Grandmother   . Heart disease Father   . Heart disease Paternal Grandfather   . Heart disease Maternal Grandmother     Social History   Social History  . Marital Status: Married    Spouse Name: N/A  . Number of Children: 2  . Years of Education: N/A   Occupational History  . Agricultural consultant   .     Social History Main Topics  . Smoking status: Never Smoker   . Smokeless tobacco: Never Used  . Alcohol Use: No  . Drug Use: No  . Sexual Activity: Not Asked   Other  Topics Concern  . 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    Nonsmoker married    Outpatient Prescriptions Prior to Visit  Medication Sig Dispense Refill  . BAYER CONTOUR NEXT TEST test strip Use as instructed 100 each 11  . fexofenadine (ALLEGRA) 180 MG tablet Take 180 mg by mouth daily. In the morning    . Lancets MISC Use Bayer Contour Next One lancets to test blood sugar 1-2 times daily 100 each 12  . levocetirizine (XYZAL) 5 MG tablet Take 5 mg by mouth every evening.     Marland Kitchen lisinopril (PRINIVIL,ZESTRIL) 10 MG tablet TAKE 1 BY MOUTH DAILY FOR HIGH BLOOD PRESSURE 90 tablet 2  . metFORMIN (GLUCOPHAGE-XR) 500 MG 24 hr tablet TAKE 1 BY MOUTH DAILY WITH BREAKFAST 90 tablet 0  . montelukast (SINGULAIR) 10 MG tablet Take 10 mg by mouth daily.     Marland Kitchen  omeprazole (PRILOSEC) 20 MG capsule Take 20 mg by mouth daily.     Norvel Richards 80 MCG/ACT AERS Place 2 sprays into the nose daily.     . simvastatin (ZOCOR) 40 MG tablet TAKE 1 BY MOUTH DAILY AT BEDTIME 90 tablet 2   No facility-administered medications prior to visit.     EXAM:  BP 100/60 mmHg  Temp(Src) 97.8 F (36.6 C) (Oral)  Wt 200 lb 12.8 oz (91.082 kg)  Body mass index is 35.01 kg/(m^2).  GENERAL: vitals reviewed and listed above, alert, oriented, appears well hydrated and in no acute distress HEENT: atraumatic, conjunctiva  clear, no obvious abnormalities on inspection of external nose and ears PSYCH: pleasant and cooperative, no obvious depression or anxiety BP Readings from Last 3 Encounters:  06/28/16 100/60  03/29/16 106/64  09/18/15 118/84   Wt Readings from Last 3 Encounters:  06/28/16 200 lb 12.8 oz (91.082 kg)  05/04/16 204 lb 3.2 oz (92.625 kg)  03/29/16 208 lb 12.8 oz (94.711 kg)   Lab Results  Component Value Date   WBC 8.2 03/22/2016   HGB 13.9 03/22/2016   HCT 41.2 03/22/2016   PLT 220.0  03/22/2016   GLUCOSE 153* 03/22/2016   CHOL 175 03/22/2016   TRIG 84.0 03/22/2016   HDL 51.60 03/22/2016   LDLDIRECT 172.4 10/02/2007   LDLCALC 107* 03/22/2016   ALT 28 03/22/2016   AST 20 03/22/2016   NA 137 03/22/2016   K 4.1 03/22/2016   CL 102 03/22/2016   CREATININE 0.59 03/22/2016   BUN 17 03/22/2016   CO2 28 03/22/2016   TSH 2.77 03/22/2016   HGBA1C 6.1 06/28/2016   MICROALBUR 1.1 03/22/2016     ASSESSMENT AND PLAN:  Discussed the following assessment and plan:  Diabetes mellitus without complication (HCC) - [id number] down  7.1 to 6.1  continue - Plan: POC HgB A1c  Medication management  Essential hypertension - good control  Hg a1c  -Patient advised to return or notify health care team  if symptoms worsen ,persist or new concerns arise.  Patient Instructions    Keep up the healthy life style  Wt Readings from Last 3 Encounters:  06/28/16 200 lb 12.8 oz (91.082 kg)  05/04/16 204 lb 3.2 oz (92.625 kg)  03/29/16 208 lb 12.8 oz (94.711 kg)   BP Readings from Last 3 Encounters:  06/28/16 100/60  03/29/16 106/64  09/18/15 118/84        Camrie Stock K. Damonique Brunelle M.D.

## 2016-06-28 NOTE — Patient Instructions (Signed)
  Keep up the healthy life style  Wt Readings from Last 3 Encounters:  06/28/16 200 lb 12.8 oz (91.082 kg)  05/04/16 204 lb 3.2 oz (92.625 kg)  03/29/16 208 lb 12.8 oz (94.711 kg)   BP Readings from Last 3 Encounters:  06/28/16 100/60  03/29/16 106/64  09/18/15 118/84

## 2016-08-08 ENCOUNTER — Other Ambulatory Visit: Payer: Self-pay | Admitting: Internal Medicine

## 2016-08-10 NOTE — Telephone Encounter (Signed)
Sent to the pharmacy by e-scribe.  Pt has upcoming appt on 12/13/16

## 2016-09-15 DIAGNOSIS — J3089 Other allergic rhinitis: Secondary | ICD-10-CM | POA: Diagnosis not present

## 2016-09-20 ENCOUNTER — Ambulatory Visit (INDEPENDENT_AMBULATORY_CARE_PROVIDER_SITE_OTHER): Payer: BLUE CROSS/BLUE SHIELD

## 2016-09-20 DIAGNOSIS — Z23 Encounter for immunization: Secondary | ICD-10-CM

## 2016-11-30 DIAGNOSIS — J0100 Acute maxillary sinusitis, unspecified: Secondary | ICD-10-CM | POA: Diagnosis not present

## 2016-11-30 DIAGNOSIS — J3089 Other allergic rhinitis: Secondary | ICD-10-CM | POA: Diagnosis not present

## 2016-11-30 DIAGNOSIS — R05 Cough: Secondary | ICD-10-CM | POA: Diagnosis not present

## 2016-12-08 ENCOUNTER — Ambulatory Visit (INDEPENDENT_AMBULATORY_CARE_PROVIDER_SITE_OTHER): Payer: BLUE CROSS/BLUE SHIELD | Admitting: Family Medicine

## 2016-12-08 ENCOUNTER — Encounter: Payer: Self-pay | Admitting: Family Medicine

## 2016-12-08 VITALS — BP 126/82 | HR 88 | Temp 98.4°F | Wt 199.8 lb

## 2016-12-08 DIAGNOSIS — B029 Zoster without complications: Secondary | ICD-10-CM | POA: Diagnosis not present

## 2016-12-08 MED ORDER — FAMCICLOVIR 500 MG PO TABS: 500.0000 mg | ORAL_TABLET | Freq: Three times a day (TID) | ORAL | 0 refills | Status: DC

## 2016-12-08 NOTE — Progress Notes (Signed)
Pre visit review using our clinic review tool, if applicable. No additional management support is needed unless otherwise documented below in the visit note. 

## 2016-12-08 NOTE — Progress Notes (Signed)
Subjective:    Patient ID: Nicole Gonzalez, female    DOB: [birth year], 45 y.o.   MRN: [id number]  HPI   Nicole Gonzalez is a 45 year old female who presents today itchy rash that started 2 days ago on her back. Associated burning and itching. She denies fever, chills, sweats, N/V/D, or myalgias. No sinus symptoms today as she reports that omnicef has provided excellent benefit. No drainage noted from rash. She sees Dr. Harlene Salts for allergies and she received omnicef for sinus infection and steroid injection. She has completed 8 days of therapy. No treatments have been tried at home.  No aggravating or alleviating factors noted.  Review of Systems  Constitutional: Negative for chills, fatigue and fever.  HENT: Positive for postnasal drip. Negative for rhinorrhea, sinus pain, sinus pressure and sneezing.   Respiratory: Negative for cough and wheezing.   Cardiovascular: Negative for chest pain and palpitations.  Gastrointestinal: Negative for abdominal pain, diarrhea, nausea and vomiting.  Musculoskeletal: Negative for myalgias.  Skin: Positive for rash.   Past Medical History:  Diagnosis Date  . Anemia     better after hysterectomy  . DIABETES MELLITUS, GESTATIONAL, HX OF 06/02/2009   Qualifier: Diagnosis of  By: Regis Bill MD, Standley Brooking   . Fracture of lower leg     fall 2011  . GERD (gastroesophageal reflux disease)   . History of basal cell carcinoma excision   . History of gestational diabetes    x2  . HTN (hypertension)   . Hyperlipidemia    [id number] 043 trx   . Increased frequency of headaches 03/15/2012  . Obesity   . Seizures ([place])    as an infant d/t fever  . UNSPECIFIED TACHYCARDIA 09/01/2010   Qualifier: Diagnosis of  By: Charlett Blake MD, Erline Levine       Social History   Social History  . Marital status: Married    Spouse name: N/A  . Number of children: 2  . Years of education: N/A   Occupational History  . Agricultural consultant   .  [employer]   Social History Main  Topics  . Smoking status: Never Smoker  . Smokeless tobacco: Never Used  . Alcohol use No  . Drug use: No  . Sexual activity: Not on file   Other Topics Concern  . 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    Nonsmoker married    Past Surgical History:  Procedure Laterality Date  . ABDOMINAL HYSTERECTOMY  06/29/2010    question had abnormal cells on Path, fibroids  . BASAL CELL CARCINOMA EXCISION    . TONSILLECTOMY      Family History  Problem Relation Age of Onset  . Melanoma Father     x 3 times  . Hypertension Father   . Diabetes Maternal Grandmother   . Hyperlipidemia Father   . Squamous cell carcinoma Father   . Arthritis Brother      Psoriatic  . Pancreatic cancer Paternal Grandmother   . Colon polyps Father   . Hyperlipidemia Maternal Grandmother   . Heart disease Father   . Heart disease Paternal Grandfather   . Heart disease Maternal Grandmother     Allergies  Allergen Reactions  . Adhesive [Tape]   .  Contrast Media [Iodinated Diagnostic Agents] Other (See Comments)    ORAL and IV Fever and itchy mouth  . Other     Adhesive, Metals  . Stadol [Butorphanol]     Current Outpatient Prescriptions on File Prior to Visit  Medication Sig Dispense Refill  . BAYER CONTOUR NEXT TEST test strip Use as instructed 100 each 11  . fexofenadine (ALLEGRA) 180 MG tablet Take 180 mg by mouth daily. In the morning    . Lancets MISC Use Bayer Contour Next One lancets to test blood sugar 1-2 times daily 100 each 12  . levocetirizine (XYZAL) 5 MG tablet Take 5 mg by mouth every evening.     Marland Kitchen lisinopril (PRINIVIL,ZESTRIL) 10 MG tablet TAKE 1 BY MOUTH DAILY FOR HIGH BLOOD PRESSURE 90 tablet 2  . metFORMIN (GLUCOPHAGE-XR) 500 MG 24 hr tablet TAKE 1 BY MOUTH DAILY WITH BREAKFAST 90 tablet 1  . montelukast (SINGULAIR) 10 MG tablet Take 10 mg by mouth  daily.     Marland Kitchen omeprazole (PRILOSEC) 20 MG capsule Take 20 mg by mouth daily.     Norvel Richards 80 MCG/ACT AERS Place 2 sprays into the nose daily.     . simvastatin (ZOCOR) 40 MG tablet TAKE 1 BY MOUTH DAILY AT BEDTIME 90 tablet 2   No current facility-administered medications on file prior to visit.     BP 126/82 (BP Location: Left Arm, Patient Position: Sitting, Cuff Size: Normal)   Pulse 88   Temp 98.4 F (36.9 C) (Oral)   Wt 199 lb 12.8 oz (90.6 kg)   SpO2 96%   BMI 34.84 kg/m        Objective:   Physical Exam  Constitutional: She is oriented to person, place, and time. She appears well-developed and well-nourished.  Eyes: Pupils are equal, round, and reactive to light. No scleral icterus.  Neck: Neck supple.  Cardiovascular: Normal rate and regular rhythm.   Pulmonary/Chest: Effort normal and breath sounds normal. She has no wheezes. She has no rales.  Abdominal: Soft. Bowel sounds are normal. There is no tenderness.  Lymphadenopathy:    She has no cervical adenopathy.  Neurological: She is alert and oriented to person, place, and time. Coordination normal.  Skin: Skin is warm and dry. Rash noted.  Erythematous papules and vesicles grouped on right side of upper back near thoracic dermatome T4.         Assessment & Plan:  1. Herpes zoster without complication Grouped vesicles with burning and itching present; will treat with Famvir; she denies pain with rash; blister and rash care provided to patient. She will follow up next week week with Dr. Regis Bill as scheduled or sooner if symptoms do not improve with treatment, worsen, or she develops a fever >100.  - famciclovir (FAMVIR) 500 MG tablet; Take 1 tablet (500 mg total) by mouth 3 (three) times daily.  Dispense: 21 tablet; Refill: 0  Delano Metz, FNP-C

## 2016-12-08 NOTE — Patient Instructions (Signed)
Shingles Shingles, which is also known as herpes zoster, is an infection that causes a painful skin rash and fluid-filled blisters. Shingles is not related to genital herpes, which is a sexually transmitted infection. Shingles only develops in people who:  Have had chickenpox.  Have received the chickenpox vaccine. (This is rare.)  What are the causes? Shingles is caused by varicella-zoster virus (VZV). This is the same virus that causes chickenpox. After exposure to VZV, the virus stays in the body in an inactive (dormant) state. Shingles develops if the virus reactivates. This can happen many years after the initial exposure to VZV. It is not known what causes this virus to reactivate. What increases the risk? People who have had chickenpox or received the chickenpox vaccine are at risk for shingles. Infection is more common in people who:  Are older than age 50.  Have a weakened defense (immune) system, such as those with HIV, AIDS, or cancer.  Are taking medicines that weaken the immune system, such as transplant medicines.  Are under great stress.  What are the signs or symptoms? Early symptoms of this condition include itching, tingling, and pain in an area on your skin. Pain may be described as burning, stabbing, or throbbing. A few days or weeks after symptoms start, a painful red rash appears, usually on one side of the body in a bandlike or beltlike pattern. The rash eventually turns into fluid-filled blisters that break open, scab over, and dry up in about 2-3 weeks. At any time during the infection, you may also develop:  A fever.  Chills.  A headache.  An upset stomach.  How is this diagnosed? This condition is diagnosed with a skin exam. Sometimes, skin or fluid samples are taken from the blisters before a diagnosis is made. These samples are examined under a microscope or sent to a lab for testing. How is this treated? There is no specific cure for this condition.  Your health care provider will probably prescribe medicines to help you manage pain, recover more quickly, and avoid long-term problems. Medicines may include:  Antiviral drugs.  Anti-inflammatory drugs.  Pain medicines.  If the area involved is on your face, you may be referred to a specialist, such as an eye doctor (ophthalmologist) or an ear, nose, and throat (ENT) doctor to help you avoid eye problems, chronic pain, or disability. Follow these instructions at home: Medicines  Take medicines only as directed by your health care provider.  Apply an anti-itch or numbing cream to the affected area as directed by your health care provider. Blister and Rash Care  Take a cool bath or apply cool compresses to the area of the rash or blisters as directed by your health care provider. This may help with pain and itching.  Keep your rash covered with a loose bandage (dressing). Wear loose-fitting clothing to help ease the pain of material rubbing against the rash.  Keep your rash and blisters clean with mild soap and cool water or as directed by your health care provider.  Check your rash every day for signs of infection. These include redness, swelling, and pain that lasts or increases.  Do not pick your blisters.  Do not scratch your rash. General instructions  Rest as directed by your health care provider.  Keep all follow-up visits as directed by your health care provider. This is important.  Until your blisters scab over, your infection can cause chickenpox in people who have never had it or been vaccinated   against it. To prevent this from happening, avoid contact with other people, especially: ? Babies. ? Pregnant women. ? Children who have eczema. ? Elderly people who have transplants. ? People who have chronic illnesses, such as leukemia or AIDS. Contact a health care provider if:  Your pain is not relieved with prescribed medicines.  Your pain does not get better after  the rash heals.  Your rash looks infected. Signs of infection include redness, swelling, and pain that lasts or increases. Get help right away if:  The rash is on your face or nose.  You have facial pain, pain around your eye area, or loss of feeling on one side of your face.  You have ear pain or you have ringing in your ear.  You have loss of taste.  Your condition gets worse. This information is not intended to replace advice given to you by your health care provider. Make sure you discuss any questions you have with your health care provider. Document Released: 11/28/2005 Document Revised: 07/24/2016 Document Reviewed: 10/09/2014 Elsevier Interactive Patient Education  2017 Elsevier Inc.  

## 2016-12-12 NOTE — Progress Notes (Signed)
Pre visit review using our clinic review tool, if applicable. No additional management support is needed unless otherwise documented below in the visit note.  Chief Complaint  Patient presents with  . Follow-up    HPI: Nicole Gonzalez 46 y.o.   du Fu shingles   Dx 3-4 days ago  Some new spots pain  Not reqiring  meds so far  Was on steroid shiot and antibiotic for sinus infection per dr Orvil Feil in the interim   Is on famvir.   Dm  / Pre dm  Fin law died and  Had issues .  Not as good  10 k  steps per day .   ROS: See pertinent positives and negatives per HPI.  Past Medical History:  Diagnosis Date  . Anemia     better after hysterectomy  . DIABETES MELLITUS, GESTATIONAL, HX OF 06/02/2009   Qualifier: Diagnosis of  By: Regis Bill MD, Standley Brooking   . Fracture of lower leg     fall 2011  . GERD (gastroesophageal reflux disease)   . History of basal cell carcinoma excision   . History of gestational diabetes    x2  . HTN (hypertension)   . Hyperlipidemia    [id number] 043 trx   . Increased frequency of headaches 03/15/2012  . Obesity   . Seizures ([place])    as an infant d/t fever  . UNSPECIFIED TACHYCARDIA 09/01/2010   Qualifier: Diagnosis of  By: Charlett Blake MD, Erline Levine      Family History  Problem Relation Age of Onset  . Melanoma Father     x 3 times  . Hypertension Father   . Diabetes Maternal Grandmother   . Hyperlipidemia Father   . Squamous cell carcinoma Father   . Arthritis Brother      Psoriatic  . Pancreatic cancer Paternal Grandmother   . Colon polyps Father   . Hyperlipidemia Maternal Grandmother   . Heart disease Father   . Heart disease Paternal Grandfather   . Heart disease Maternal Grandmother     Social History   Social History  . Marital status: Married    Spouse name: N/A  . Number of children: 2  . Years of education: N/A   Occupational History  . Agricultural consultant   .  [employer]   Social History Main Topics  . Smoking status: Never Smoker    . Smokeless tobacco: Never Used  . Alcohol use No  . Drug use: No  . Sexual activity: Not Asked   Other Topics Concern  . 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    Nonsmoker married    Outpatient Medications Prior to Visit  Medication Sig Dispense Refill  . BAYER CONTOUR NEXT TEST test strip Use as instructed 100 each 11  . famciclovir (FAMVIR) 500 MG tablet Take 1 tablet (500 mg total) by mouth 3 (three) times daily. 21 tablet 0  . fexofenadine (ALLEGRA) 180 MG tablet Take 180 mg by mouth daily. In the morning    . Lancets MISC Use Bayer Contour Next One lancets to test blood sugar 1-2 times daily 100 each 12  . levocetirizine (XYZAL) 5 MG tablet Take 5 mg by mouth every evening.     Marland Kitchen lisinopril (PRINIVIL,ZESTRIL)  10 MG tablet TAKE 1 BY MOUTH DAILY FOR HIGH BLOOD PRESSURE 90 tablet 2  . metFORMIN (GLUCOPHAGE-XR) 500 MG 24 hr tablet TAKE 1 BY MOUTH DAILY WITH BREAKFAST 90 tablet 1  . montelukast (SINGULAIR) 10 MG tablet Take 10 mg by mouth daily.     Marland Kitchen omeprazole (PRILOSEC) 20 MG capsule Take 20 mg by mouth daily.     Norvel Richards 80 MCG/ACT AERS Place 2 sprays into the nose daily.     . simvastatin (ZOCOR) 40 MG tablet TAKE 1 BY MOUTH DAILY AT BEDTIME 90 tablet 2   No facility-administered medications prior to visit.      EXAM:  BP 102/68 (BP Location: Left Arm, Patient Position: Sitting, Cuff Size: Large)   Temp 98.1 F (36.7 C) (Oral)   Wt 200 lb (90.7 kg)   BMI 34.87 kg/m   Body mass index is 34.87 kg/m.  GENERAL: vitals reviewed and listed above, alert, oriented, appears well hydrated and in no acute distress HEENT: atraumatic, conjunctiva  clear, no obvious abnormalities on inspection of external nose and ears skin right thoracic  Post rash about t 5? 6?   No blisters  Mod rash MS: moves all extremities without noticeable focal  abnormality PSYCH:  pleasant and cooperative, no obvious depression or anxiety Lab Results  Component Value Date   WBC 8.2 03/22/2016   HGB 13.9 03/22/2016   HCT 41.2 03/22/2016   PLT 220.0 03/22/2016   GLUCOSE 153 (H) 03/22/2016   CHOL 175 03/22/2016   TRIG 84.0 03/22/2016   HDL 51.60 03/22/2016   LDLDIRECT 172.4 10/02/2007   LDLCALC 107 (H) 03/22/2016   ALT 28 03/22/2016   AST 20 03/22/2016   NA 137 03/22/2016   K 4.1 03/22/2016   CL 102 03/22/2016   CREATININE 0.59 03/22/2016   BUN 17 03/22/2016   CO2 28 03/22/2016   TSH 2.77 03/22/2016   HGBA1C 6.0 12/13/2016   MICROALBUR 1.1 03/22/2016   Wt Readings from Last 3 Encounters:  12/13/16 200 lb (90.7 kg)  12/08/16 199 lb 12.8 oz (90.6 kg)  06/28/16 200 lb 12.8 oz (91.1 kg)   BP Readings from Last 3 Encounters:  12/13/16 102/68  12/08/16 126/82  06/28/16 100/60    ASSESSMENT AND PLAN:  Discussed the following assessment and plan:  Prediabetes - acceptable continue lsi and metformin - Plan: POCT A1C  Essential hypertension  Herpes zoster without complication - expectant managment  dsic utd on flu vaccine  -Patient advised to return or notify health care team  if symptoms worsen ,persist or new concerns arise.  Patient Instructions  Continue lifestyle intervention healthy eating and exercise .  Let us know if the shingles  Pain requires  More pain relief besides  Ibuprofen or similar  Let us know .    BP is good   Blood sugar is stable     Mariann Laster K. Jaia Alonge M.D.

## 2016-12-13 ENCOUNTER — Ambulatory Visit (INDEPENDENT_AMBULATORY_CARE_PROVIDER_SITE_OTHER): Payer: BLUE CROSS/BLUE SHIELD | Admitting: Internal Medicine

## 2016-12-13 ENCOUNTER — Encounter: Payer: Self-pay | Admitting: Internal Medicine

## 2016-12-13 VITALS — BP 102/68 | Temp 98.1°F | Wt 200.0 lb

## 2016-12-13 DIAGNOSIS — B029 Zoster without complications: Secondary | ICD-10-CM

## 2016-12-13 DIAGNOSIS — R7303 Prediabetes: Secondary | ICD-10-CM | POA: Diagnosis not present

## 2016-12-13 DIAGNOSIS — I10 Essential (primary) hypertension: Secondary | ICD-10-CM | POA: Diagnosis not present

## 2016-12-13 LAB — POCT GLYCOSYLATED HEMOGLOBIN (HGB A1C): HEMOGLOBIN A1C: 6

## 2016-12-13 NOTE — Patient Instructions (Signed)
Continue lifestyle intervention healthy eating and exercise .  Let us know if the shingles  Pain requires  More pain relief besides  Ibuprofen or similar  Let us know .    BP is good   Blood sugar is stable

## 2017-02-10 ENCOUNTER — Other Ambulatory Visit: Payer: Self-pay | Admitting: Family Medicine

## 2017-02-10 MED ORDER — SIMVASTATIN 40 MG PO TABS: ORAL_TABLET | ORAL | 0 refills | Status: DC

## 2017-02-10 MED ORDER — METFORMIN HCL ER 500 MG PO TB24: ORAL_TABLET | ORAL | 0 refills | Status: DC

## 2017-02-10 MED ORDER — LISINOPRIL 10 MG PO TABS: ORAL_TABLET | ORAL | 0 refills | Status: DC

## 2017-02-10 NOTE — Telephone Encounter (Signed)
Sent to the pharmacy by e-scribe.  Pt has upcoming cpx on 04/05/17.

## 2017-03-28 ENCOUNTER — Other Ambulatory Visit: Payer: Self-pay | Admitting: Family Medicine

## 2017-03-28 DIAGNOSIS — I10 Essential (primary) hypertension: Secondary | ICD-10-CM

## 2017-03-28 DIAGNOSIS — E785 Hyperlipidemia, unspecified: Secondary | ICD-10-CM

## 2017-03-28 DIAGNOSIS — E119 Type 2 diabetes mellitus without complications: Secondary | ICD-10-CM

## 2017-03-29 ENCOUNTER — Other Ambulatory Visit (INDEPENDENT_AMBULATORY_CARE_PROVIDER_SITE_OTHER): Payer: BLUE CROSS/BLUE SHIELD

## 2017-03-29 DIAGNOSIS — E119 Type 2 diabetes mellitus without complications: Secondary | ICD-10-CM | POA: Diagnosis not present

## 2017-03-29 DIAGNOSIS — I10 Essential (primary) hypertension: Secondary | ICD-10-CM | POA: Diagnosis not present

## 2017-03-29 DIAGNOSIS — E785 Hyperlipidemia, unspecified: Secondary | ICD-10-CM

## 2017-03-29 LAB — CBC WITH DIFFERENTIAL/PLATELET
BASOS ABS: 0 10*3/uL (ref 0.0–0.1)
Basophils Relative: 0.5 % (ref 0.0–3.0)
Eosinophils Absolute: 0.1 10*3/uL (ref 0.0–0.7)
Eosinophils Relative: 2.1 % (ref 0.0–5.0)
HEMATOCRIT: 42 % (ref 36.0–46.0)
Hemoglobin: 14.1 g/dL (ref 12.0–15.0)
LYMPHS PCT: 27.4 % (ref 12.0–46.0)
Lymphs Abs: 1.8 10*3/uL (ref 0.7–4.0)
MCHC: 33.5 g/dL (ref 30.0–36.0)
MCV: 87 fl (ref 78.0–100.0)
MONOS PCT: 6.7 % (ref 3.0–12.0)
Monocytes Absolute: 0.4 10*3/uL (ref 0.1–1.0)
Neutro Abs: 4.1 10*3/uL (ref 1.4–7.7)
Neutrophils Relative %: 63.3 % (ref 43.0–77.0)
Platelets: 230 10*3/uL (ref 150.0–400.0)
RBC: 4.83 Mil/uL (ref 3.87–5.11)
RDW: 12.5 % (ref 11.5–15.5)
WBC: 6.5 10*3/uL (ref 4.0–10.5)

## 2017-03-29 LAB — BASIC METABOLIC PANEL
BUN: 17 mg/dL (ref 6–23)
CALCIUM: 9.3 mg/dL (ref 8.4–10.5)
CO2: 25 meq/L (ref 19–32)
Chloride: 104 mEq/L (ref 96–112)
Creatinine, Ser: 0.59 mg/dL (ref 0.40–1.20)
GFR: 116.09 mL/min (ref >60.00)
Glucose, Bld: 154 mg/dL — ABNORMAL HIGH (ref 70–99)
Potassium: 4.3 mEq/L (ref 3.5–5.1)
SODIUM: 138 meq/L (ref 135–145)

## 2017-03-29 LAB — LIPID PANEL
CHOL/HDL RATIO: 3
Cholesterol: 171 mg/dL (ref 0–200)
HDL: 49.4 mg/dL (ref >39.00)
LDL Cholesterol: 105 mg/dL — ABNORMAL HIGH (ref 0–99)
NONHDL: 121.55
Triglycerides: 82 mg/dL (ref 0.0–149.0)
VLDL: 16.4 mg/dL (ref 0.0–40.0)

## 2017-03-29 LAB — HEPATIC FUNCTION PANEL
ALBUMIN: 4.4 g/dL (ref 3.5–5.2)
ALT: 21 U/L (ref 0–35)
AST: 14 U/L (ref 0–37)
Alkaline Phosphatase: 63 U/L (ref 39–117)
Bilirubin, Direct: 0.1 mg/dL (ref 0.0–0.3)
TOTAL PROTEIN: 6.8 g/dL (ref 6.0–8.3)
Total Bilirubin: 0.6 mg/dL (ref 0.2–1.2)

## 2017-03-29 LAB — HEMOGLOBIN A1C: HEMOGLOBIN A1C: 6.8 % — AB (ref 4.6–6.5)

## 2017-03-29 LAB — TSH: TSH: 1.89 u[IU]/mL (ref 0.35–4.50)

## 2017-04-04 NOTE — Progress Notes (Signed)
Chief Complaint  Patient presents with  . Annual Exam    HPI: Patient  Nicole Gonzalez  46 y.o. comes in today for Preventive Health Care visit  And Chronic disease management   BP  Ok no se of med  DM weight : Dietician said no snacking  But gets hungry so ahs peanut butter on a cracker in med day   Not doing as well no exercise cause of family loss and  Stress   But back into cycling exercise  Taking metformin at night 500  Had allergy rx steroids    recently but better No eye sx  Neg neuropathy sx   fam father cacscore ?over 400 and broth 200 range  ? Asks about this  Has no cv sx   Health Maintenance  Topic Date Due  . OPHTHALMOLOGY EXAM  12/30/2016  . FOOT EXAM  03/29/2017  . PAP SMEAR  03/22/2018 (Originally 03/26/2016)  . HIV Screening  04/05/2018 (Originally [birth year]/1985)  . INFLUENZA VACCINE  07/12/2017  . HEMOGLOBIN A1C  09/28/2017  . COLONOSCOPY  05/03/2018  . TETANUS/TDAP  11/18/2018  . PNEUMOCOCCAL POLYSACCHARIDE VACCINE (2) 03/26/2020   Health Maintenance Review LIFESTYLE:  Exercise:  Not a lot after shingesl but getting back to it.  Tobacco/ETS:n Alcohol: n Sugar beverages: ocass lemonade .  Sleep: 6-7  Drug use: no HH of  Work: 40 +    ROS:  GEN/ HEENT: No fever, significant weight changes sweats headaches vision problems hearing changes, CV/ PULM; No chest pain shortness of breath cough, syncope,edema  change in exercise tolerance. GI /GU: No adominal pain, vomiting, change in bowel habits. No blood in the stool. No significant GU symptoms. SKIN/HEME: ,no acute skin rashes suspicious lesions or bleeding. No lymphadenopathy, nodules, masses.  NEURO/ PSYCH:  No neurologic signs such as weakness numbness. No depression anxiety. IMM/ Allergy: No unusual infections.  Allergy .   REST of 12 system review negative except as per HPI   Past Medical History:  Diagnosis Date  . Anemia     better after hysterectomy  . DIABETES MELLITUS, GESTATIONAL, HX OF  06/02/2009   Qualifier: Diagnosis of  By: Regis Bill MD, Standley Brooking   . Fracture of lower leg     fall 2011  . GERD (gastroesophageal reflux disease)   . History of basal cell carcinoma excision   . History of gestational diabetes    x2  . HTN (hypertension)   . Hyperlipidemia    [id number] trx   . Increased frequency of headaches 03/15/2012  . Obesity   . Seizures ([place])    as an infant d/t fever  . UNSPECIFIED TACHYCARDIA 09/01/2010   Qualifier: Diagnosis of  By: Charlett Blake MD, Erline Levine      Past Surgical History:  Procedure Laterality Date  . ABDOMINAL HYSTERECTOMY  06/29/2010    question had abnormal cells on Path, fibroids  . BASAL CELL CARCINOMA EXCISION    . TONSILLECTOMY      Family History  Problem Relation Age of Onset  . Melanoma Father     x 3 times  . Hypertension Father   . Hyperlipidemia Father   . Squamous cell carcinoma Father   . Colon polyps Father   . Heart disease Father   . Diabetes Maternal Grandmother   . Hyperlipidemia Maternal Grandmother   . Heart disease Maternal Grandmother   . Arthritis Brother      Psoriatic  . Pancreatic cancer Paternal Grandmother   . Heart  disease Paternal Grandfather     Social History   Social History  . Marital status: Married    Spouse name: N/A  . Number of children: 2  . Years of education: N/A   Occupational History  . Agricultural consultant   .  [employer]   Social History Main Topics  . Smoking status: Never Smoker  . Smokeless tobacco: Never Used  . Alcohol use No  . Drug use: No  . Sexual activity: Not Asked   Other Topics Concern  . 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    Nonsmoker married    Outpatient Medications Prior to Visit  Medication Sig Dispense Refill  . BAYER CONTOUR NEXT TEST test strip Use as instructed 100 each 11  . fexofenadine (ALLEGRA) 180 MG tablet  Take 180 mg by mouth daily. In the morning    . Lancets MISC Use Bayer Contour Next One lancets to test blood sugar 1-2 times daily 100 each 12  . levocetirizine (XYZAL) 5 MG tablet Take 5 mg by mouth every evening.     Marland Kitchen lisinopril (PRINIVIL,ZESTRIL) 10 MG tablet TAKE 1 BY MOUTH DAILY FOR HIGH BLOOD PRESSURE 90 tablet 0  . metFORMIN (GLUCOPHAGE-XR) 500 MG 24 hr tablet TAKE 1 BY MOUTH DAILY WITH BREAKFAST 90 tablet 0  . montelukast (SINGULAIR) 10 MG tablet Take 10 mg by mouth daily.     Marland Kitchen omeprazole (PRILOSEC) 20 MG capsule Take 20 mg by mouth daily.     Norvel Richards 80 MCG/ACT AERS Place 2 sprays into the nose daily.     . simvastatin (ZOCOR) 40 MG tablet TAKE 1 BY MOUTH DAILY AT BEDTIME 90 tablet 0  . famciclovir (FAMVIR) 500 MG tablet Take 1 tablet (500 mg total) by mouth 3 (three) times daily. 21 tablet 0   No facility-administered medications prior to visit.      EXAM:  BP 98/70 (BP Location: Right Arm, Patient Position: Sitting, Cuff Size: Normal)   Pulse 88   Temp 97.9 F (36.6 C) (Oral)   Ht 5\' 3"  (1.6 m)   Wt 208 lb 4.8 oz (94.5 kg)   BMI 36.90 kg/m   Body mass index is 36.9 kg/m. Wt Readings from Last 3 Encounters:  04/05/17 208 lb 4.8 oz (94.5 kg)  12/13/16 200 lb (90.7 kg)  12/08/16 199 lb 12.8 oz (90.6 kg)    Physical Exam: Vital signs reviewed HWE:XHBZ is a well-developed well-nourished alert cooperative    who appearsr stated age in no acute distress.  HEENT: normocephalic atraumatic , Eyes: PERRL EOM's full, conjunctiva clear, Nares: paten,t no deformity discharge or tenderness., Ears: no deformity EAC's clear TMs with normal landmarks. Mouth: clear OP, no lesions, edema.  Moist mucous membranes. Dentition in adequate repair. NECK: supple without masses, thyromegaly or bruits. CHEST/PULM:  Clear to auscultation and percussion breath sounds equal no wheeze , rales or rhonchi. No chest wall deformities or tenderness. Breast: normal by inspection . No dimpling,  discharge, masses, tenderness or discharge . CV: PMI is nondisplaced, S1 S2 no gallops, murmurs, rubs. Peripheral pulses are full without delay.No JVD .  ABDOMEN: Bowel sounds normal nontender  No guard or rebound, no hepato splenomegal no CVA tenderness.  No hernia. Extremtities:  No  clubbing cyanosis or edema, no acute joint swelling or redness no focal atrophy NEURO:  Oriented x3, cranial nerves 3-12 appear to be intact, no obvious focal weakness,gait within normal limits no abnormal reflexes or asymmetrical SKIN: No acute rashes normal turgor, color, no bruising or petechiae. PSYCH: Oriented, good eye contact, no obvious depression anxiety, cognition and judgment appear normal. LN: no cervical axillary inguinal adenopathy Diabetic Foot Exam - Simple   Simple Foot Form Diabetic Foot exam was performed with the following findings:  Yes 04/05/2017 10:11 AM  Visual Inspection No deformities, no ulcerations, no other skin breakdown bilaterally:  Yes Sensation Testing Intact to touch and monofilament testing bilaterally:  Yes Pulse Check Posterior Tibialis and Dorsalis pulse intact bilaterally:  Yes Comments smll callous under main MT head no ulcer or redness      Lab Results  Component Value Date   WBC 6.5 03/29/2017   HGB 14.1 03/29/2017   HCT 42.0 03/29/2017   PLT 230.0 03/29/2017   GLUCOSE 154 (H) 03/29/2017   CHOL 171 03/29/2017   TRIG 82.0 03/29/2017   HDL 49.40 03/29/2017   LDLDIRECT 172.4 10/02/2007   LDLCALC 105 (H) 03/29/2017   ALT 21 03/29/2017   AST 14 03/29/2017   NA 138 03/29/2017   K 4.3 03/29/2017   CL 104 03/29/2017   CREATININE 0.59 03/29/2017   BUN 17 03/29/2017   CO2 25 03/29/2017   TSH 1.89 03/29/2017   HGBA1C 6.8 (H) 03/29/2017   MICROALBUR 1.1 03/22/2016    BP Readings from Last 3 Encounters:  04/05/17 98/70  12/13/16 102/68  12/08/16 126/82    Lab results reviewed with patient   ASSESSMENT AND PLAN:  Discussed the following assessment and  plan:  Visit for preventive health examination  Essential hypertension  Medication management - Plan: Hemoglobin A1c, Lipid panel  Diabetes mellitus without complication ([place]) - Plan: Hemoglobin A1c, Lipid panel  Hyperlipidemia, unspecified hyperlipidemia type - Plan: Hemoglobin A1c, Lipid panel Consideration of adjusting medicine but she will work on dietary intake gaining the weight off again and exercise and closer follow-up about 3-4 months. LDL is just above 100 she is on simvastatin consideration of intensification of therapy or getting her LDL down to the 70 range. Still use lifestyle will check lipid and A1c in about 3-4 months pre-visit. Patient Care Team: Burnis Medin, MD as PCP - General Gwendolyn Fill, MD as Attending Physician (Obstetrics and Gynecology) Tiajuana Amass, MD (Allergy) Patient Instructions  Attention to eating and exercise as we discussed. \ The 10 pound weight gain probably related to the elevated a1c etc.  So getting back down should help a lot .... Lifestyle interventions and repeat A1c and lipid panel in about 3-4 months.  Dx hld and dm Consideration of intensification ocular statin medicine such as switching to Crestor or Lipitor. Let us know how we can help. Get rid of all cervical beverages 0 them out and avoid crackers chips processed carbohydrates. Handful of nuts wit whole fruit would be more likely to fill you often meticulous hungry.  shingles vaccine  Age 55 or thereabouts  Called shingrix   Ask your insurance about coverage  .         Standley Brooking. Panosh M.D.

## 2017-04-05 ENCOUNTER — Encounter: Payer: Self-pay | Admitting: Internal Medicine

## 2017-04-05 ENCOUNTER — Ambulatory Visit (INDEPENDENT_AMBULATORY_CARE_PROVIDER_SITE_OTHER): Payer: BLUE CROSS/BLUE SHIELD | Admitting: Internal Medicine

## 2017-04-05 VITALS — BP 98/70 | HR 88 | Temp 97.9°F | Ht 63.0 in | Wt 208.3 lb

## 2017-04-05 DIAGNOSIS — E785 Hyperlipidemia, unspecified: Secondary | ICD-10-CM | POA: Diagnosis not present

## 2017-04-05 DIAGNOSIS — I10 Essential (primary) hypertension: Secondary | ICD-10-CM | POA: Diagnosis not present

## 2017-04-05 DIAGNOSIS — E119 Type 2 diabetes mellitus without complications: Secondary | ICD-10-CM | POA: Diagnosis not present

## 2017-04-05 DIAGNOSIS — Z79899 Other long term (current) drug therapy: Secondary | ICD-10-CM | POA: Diagnosis not present

## 2017-04-05 DIAGNOSIS — Z0000 Encounter for general adult medical examination without abnormal findings: Secondary | ICD-10-CM | POA: Diagnosis not present

## 2017-04-05 NOTE — Patient Instructions (Addendum)
Attention to eating and exercise as we discussed. \ The 10 pound weight gain probably related to the elevated a1c etc.  So getting back down should help a lot .... Lifestyle interventions and repeat A1c and lipid panel in about 3-4 months.  Dx hld and dm Consideration of intensification ocular statin medicine such as switching to Crestor or Lipitor. Let us know how we can help. Get rid of all cervical beverages 0 them out and avoid crackers chips processed carbohydrates. Handful of nuts wit whole fruit would be more likely to fill you often meticulous hungry.  shingles vaccine  Age 46 or thereabouts  Called shingrix   Ask your insurance about coverage  .

## 2017-04-24 ENCOUNTER — Other Ambulatory Visit: Payer: Self-pay | Admitting: Internal Medicine

## 2017-04-24 DIAGNOSIS — Z1231 Encounter for screening mammogram for malignant neoplasm of breast: Secondary | ICD-10-CM

## 2017-05-02 DIAGNOSIS — S80212A Abrasion, left knee, initial encounter: Secondary | ICD-10-CM | POA: Diagnosis not present

## 2017-05-02 DIAGNOSIS — M25461 Effusion, right knee: Secondary | ICD-10-CM | POA: Diagnosis not present

## 2017-05-02 DIAGNOSIS — S4991XA Unspecified injury of right shoulder and upper arm, initial encounter: Secondary | ICD-10-CM | POA: Diagnosis not present

## 2017-05-02 DIAGNOSIS — M25511 Pain in right shoulder: Secondary | ICD-10-CM | POA: Diagnosis not present

## 2017-05-02 DIAGNOSIS — M25561 Pain in right knee: Secondary | ICD-10-CM | POA: Diagnosis not present

## 2017-05-02 DIAGNOSIS — W182XXA Fall in (into) shower or empty bathtub, initial encounter: Secondary | ICD-10-CM | POA: Diagnosis not present

## 2017-05-14 ENCOUNTER — Other Ambulatory Visit: Payer: Self-pay | Admitting: Internal Medicine

## 2017-06-05 ENCOUNTER — Ambulatory Visit: Payer: BLUE CROSS/BLUE SHIELD

## 2017-06-06 ENCOUNTER — Ambulatory Visit
Admission: RE | Admit: 2017-06-06 | Discharge: 2017-06-06 | Disposition: A | Discharge status: Home / Self Care | Payer: BLUE CROSS/BLUE SHIELD | Source: Ambulatory Visit | Attending: Internal Medicine | Admitting: Internal Medicine

## 2017-06-06 DIAGNOSIS — Z1231 Encounter for screening mammogram for malignant neoplasm of breast: Secondary | ICD-10-CM | POA: Diagnosis not present

## 2017-06-06 DIAGNOSIS — L570 Actinic keratosis: Secondary | ICD-10-CM | POA: Diagnosis not present

## 2017-06-06 HISTORY — DX: Malignant (primary) neoplasm, unspecified: C80.1

## 2017-07-11 ENCOUNTER — Encounter: Payer: Self-pay | Admitting: Family Medicine

## 2017-07-11 ENCOUNTER — Telehealth: Payer: Self-pay | Admitting: Internal Medicine

## 2017-07-11 ENCOUNTER — Other Ambulatory Visit: Payer: Self-pay | Admitting: Emergency Medicine

## 2017-07-11 MED ORDER — METFORMIN HCL ER 500 MG PO TB24: ORAL_TABLET | ORAL | 1 refills | Status: DC

## 2017-07-11 NOTE — Telephone Encounter (Signed)
Left a VM for patient to give the office a call back.  

## 2017-07-11 NOTE — Telephone Encounter (Signed)
Spoke with patient regarding blood sugar readings running consistently in the 200 hundreds. Patient states she is exercising, avoiding carbs, sweets, and processed foods. Patient is questioning whether or not it could be related to the hysterectomy she had and would like to know if her levels should be tested. Please advise.

## 2017-07-11 NOTE — Telephone Encounter (Signed)
Pt is calling back stating that her sugars has been out of control no matter what she eats and how she eats it.  Pt made mention that she has had a hysterectomy and not sure if it is due to hormones.  She also state that her Bp is great.

## 2017-07-11 NOTE — Telephone Encounter (Signed)
Would increase the metformin   By 500 mg per day if she is able  tolerates  ( ie 2- 500 mg per day?)    Keep reg lab appt  Lipid and hg a1c  We may need to add other meds   don't think he hysterectomy cause the issue   Lab Results  Component Value Date   WBC 6.5 03/29/2017   HGB 14.1 03/29/2017   HCT 42.0 03/29/2017   PLT 230.0 03/29/2017   GLUCOSE 154 (H) 03/29/2017   CHOL 171 03/29/2017   TRIG 82.0 03/29/2017   HDL 49.40 03/29/2017   LDLDIRECT 172.4 10/02/2007   LDLCALC 105 (H) 03/29/2017   ALT 21 03/29/2017   AST 14 03/29/2017   NA 138 03/29/2017   K 4.3 03/29/2017   CL 104 03/29/2017   CREATININE 0.59 03/29/2017   BUN 17 03/29/2017   CO2 25 03/29/2017   TSH 1.89 03/29/2017   HGBA1C 6.8 (H) 03/29/2017   MICROALBUR 1.1 03/22/2016

## 2017-07-11 NOTE — Telephone Encounter (Signed)
Patient agrees that she will take 2 tablets daily and monitor her sugar until her appointment with Dr. Regis Bill

## 2017-07-18 ENCOUNTER — Other Ambulatory Visit: Payer: Self-pay | Admitting: Emergency Medicine

## 2017-07-18 MED ORDER — GLUCOSE BLOOD VI STRP: ORAL_STRIP | 12 refills | Status: DC

## 2017-07-18 MED ORDER — LANCETS MISC: 12 refills | Status: DC

## 2017-07-19 ENCOUNTER — Other Ambulatory Visit: Payer: Self-pay | Admitting: Emergency Medicine

## 2017-07-19 MED ORDER — METFORMIN HCL ER 500 MG PO TB24: ORAL_TABLET | ORAL | 2 refills | Status: DC

## 2017-07-19 MED ORDER — GLUCOSE BLOOD VI STRP: ORAL_STRIP | 12 refills | Status: DC

## 2017-07-21 ENCOUNTER — Other Ambulatory Visit: Payer: Self-pay | Admitting: Emergency Medicine

## 2017-07-21 MED ORDER — METFORMIN HCL ER 500 MG PO TB24: ORAL_TABLET | ORAL | 2 refills | Status: DC

## 2017-07-21 MED ORDER — LANCETS MISC: 12 refills | Status: DC

## 2017-07-21 MED ORDER — GLUCOSE BLOOD VI STRP: ORAL_STRIP | 12 refills | Status: DC

## 2017-07-28 ENCOUNTER — Other Ambulatory Visit (INDEPENDENT_AMBULATORY_CARE_PROVIDER_SITE_OTHER): Payer: BLUE CROSS/BLUE SHIELD

## 2017-07-28 DIAGNOSIS — E785 Hyperlipidemia, unspecified: Secondary | ICD-10-CM | POA: Diagnosis not present

## 2017-07-28 DIAGNOSIS — E119 Type 2 diabetes mellitus without complications: Secondary | ICD-10-CM | POA: Diagnosis not present

## 2017-07-28 DIAGNOSIS — Z79899 Other long term (current) drug therapy: Secondary | ICD-10-CM

## 2017-07-28 LAB — HEMOGLOBIN A1C: Hgb A1c MFr Bld: 6.7 % — ABNORMAL HIGH (ref 4.6–6.5)

## 2017-07-28 LAB — LIPID PANEL
CHOL/HDL RATIO: 3
Cholesterol: 139 mg/dL (ref 0–200)
HDL: 42.7 mg/dL (ref >39.00)
LDL Cholesterol: 77 mg/dL (ref 0–99)
NONHDL: 96.11
TRIGLYCERIDES: 98 mg/dL (ref 0.0–149.0)
VLDL: 19.6 mg/dL (ref 0.0–40.0)

## 2017-08-04 ENCOUNTER — Encounter: Payer: Self-pay | Admitting: Internal Medicine

## 2017-08-04 ENCOUNTER — Ambulatory Visit (INDEPENDENT_AMBULATORY_CARE_PROVIDER_SITE_OTHER): Payer: BLUE CROSS/BLUE SHIELD | Admitting: Internal Medicine

## 2017-08-04 VITALS — BP 110/70 | HR 81 | Temp 98.1°F | Wt 206.6 lb

## 2017-08-04 DIAGNOSIS — Z79899 Other long term (current) drug therapy: Secondary | ICD-10-CM

## 2017-08-04 DIAGNOSIS — I10 Essential (primary) hypertension: Secondary | ICD-10-CM | POA: Diagnosis not present

## 2017-08-04 DIAGNOSIS — E119 Type 2 diabetes mellitus without complications: Secondary | ICD-10-CM | POA: Diagnosis not present

## 2017-08-04 DIAGNOSIS — E785 Hyperlipidemia, unspecified: Secondary | ICD-10-CM | POA: Diagnosis not present

## 2017-08-04 NOTE — Progress Notes (Signed)
Chief Complaint  Patient presents with  . Follow-up    HPI: Nicole Gonzalez 46 y.o. come in for Chronic disease management   Blood sugars means doing much better although sometimes difficult taking 2 500 mg metformin a day without significant side effect at this time.  Getting back to exercise.  Blood pressure has been in the 100s 110 range in good range.  No new symptoms. ROS: See pertinent positives and negatives per HPI.  Past Medical History:  Diagnosis Date  . Anemia     better after hysterectomy  . Cancer (H[facility])    basil cell carcinoma/forehead  . DIABETES MELLITUS, GESTATIONAL, HX OF 06/02/2009   Qualifier: Diagnosis of  By: Regis Bill MD, Standley Brooking   . Fracture of lower leg     fall 2011  . GERD (gastroesophageal reflux disease)   . History of basal cell carcinoma excision   . History of gestational diabetes    x2  . HTN (hypertension)   . Hyperlipidemia    [id number] 043 trx   . Increased frequency of headaches 03/15/2012  . Obesity   . Seizures ([place])    as an infant d/t fever  . UNSPECIFIED TACHYCARDIA 09/01/2010   Qualifier: Diagnosis of  By: Charlett Blake MD, Erline Levine      Family History  Problem Relation Age of Onset  . Melanoma Father        x 3 times  . Hypertension Father   . Hyperlipidemia Father   . Squamous cell carcinoma Father   . Colon polyps Father   . Heart disease Father   . Diabetes Maternal Grandmother   . Hyperlipidemia Maternal Grandmother   . Heart disease Maternal Grandmother   . Arthritis Brother         Psoriatic  . Pancreatic cancer Paternal Grandmother   . Heart disease Paternal Grandfather   . Breast cancer Brother     Social History   Social History  . Marital status: Married    Spouse name: N/A  . Number of children: 2  . Years of education: N/A   Occupational History  . Agricultural consultant   .  [employer]   Social History Main Topics  . Smoking status: Never Smoker  . Smokeless tobacco: Never Used  . Alcohol use No   . Drug use: No  . Sexual activity: Not Asked   Other Topics Concern  . 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    Nonsmoker married    Outpatient Medications Prior to Visit  Medication Sig Dispense Refill  . BAYER CONTOUR NEXT TEST test strip Use as instructed 100 each 11  . fexofenadine (ALLEGRA) 180 MG tablet Take 180 mg by mouth daily. In the morning    . glucose blood (BAYER CONTOUR TEST) test strip Test 1-2 times daily Dx E11.9 100 each 12  . Lancets MISC Use Bayer Contour Next One lancets to test blood sugar 1-2 times daily 100 each 12  . levocetirizine (XYZAL) 5 MG tablet Take 5 mg by mouth every evening.     Marland Kitchen lisinopril (PRINIVIL,ZESTRIL) 10 MG tablet TAKE 1 TABLET BY MOUTH DAILY FOR HIGH BLOOD PRESSURE 90 tablet 0  . metFORMIN (GLUCOPHAGE-XR) 500 MG 24 hr tablet TAKE 2 TABLET  BY MOUTH DAILY WITH BREAKFAST. GENERIC EQUIVALENT FOR GLUCOPHAGE XR 60 tablet 2  . montelukast (SINGULAIR) 10 MG tablet Take 10 mg by mouth daily.     Marland Kitchen omeprazole (PRILOSEC) 20 MG capsule Take 20 mg by mouth daily.     Norvel Richards 80 MCG/ACT AERS Place 2 sprays into the nose daily.     . simvastatin (ZOCOR) 40 MG tablet TAKE 1 TABLET BY MOUTH DAILY AT BEDTIME 90 tablet 0   No facility-administered medications prior to visit.      EXAM:  BP 110/70 (BP Location: Right Arm, Patient Position: Sitting, Cuff Size: Normal)   Pulse 81   Temp 98.1 F (36.7 C) (Oral)   Wt 206 lb 9.6 oz (93.7 kg)   BMI 36.60 kg/m   Body mass index is 36.6 kg/m.  GENERAL: vitals reviewed and listed above, alert, oriented, appears well hydrated and in no acute distress HEENT: atraumatic, conjunctiva  clear, no obvious abnormalities on inspection of external nose and ears MS: moves all extremities without noticeable focal  abnormality PSYCH: pleasant and cooperative, no obvious depression or  anxiety Lab Results  Component Value Date   WBC 6.5 03/29/2017   HGB 14.1 03/29/2017   HCT 42.0 03/29/2017   PLT 230.0 03/29/2017   GLUCOSE 154 (H) 03/29/2017   CHOL 139 07/28/2017   TRIG 98.0 07/28/2017   HDL 42.70 07/28/2017   LDLDIRECT 172.4 10/02/2007   LDLCALC 77 07/28/2017   ALT 21 03/29/2017   AST 14 03/29/2017   NA 138 03/29/2017   K 4.3 03/29/2017   CL 104 03/29/2017   CREATININE 0.59 03/29/2017   BUN 17 03/29/2017   CO2 25 03/29/2017   TSH 1.89 03/29/2017   HGBA1C 6.7 (H) 07/28/2017   MICROALBUR 1.1 03/22/2016   BP Readings from Last 3 Encounters:  08/04/17 110/70  04/05/17 98/70  12/13/16 102/68   Wt Readings from Last 3 Encounters:  08/04/17 206 lb 9.6 oz (93.7 kg)  04/05/17 208 lb 4.8 oz (94.5 kg)  12/13/16 200 lb (90.7 kg)   Laboratory test review ASSESSMENT AND PLAN:  Discussed the following assessment and plan:  Diabetes mellitus without complication ([place]) - adequat control cont and attend to lsi disc  Medication management  Hyperlipidemia, unspecified hyperlipidemia type - ldl at goal   Essential hypertension - excellent readings  -Patient advised to return or notify health care team  if  new concerns arise.  Patient Instructions  Plan ROV    in 4-6 months and can do a1c at visit .   Continue lifestyle intervention healthy eating and exercise .      Standley Brooking. Kajal Scalici M.D.

## 2017-08-04 NOTE — Patient Instructions (Signed)
Plan ROV    in 4-6 months and can do a1c at visit .   Continue lifestyle intervention healthy eating and exercise .

## 2017-09-01 ENCOUNTER — Encounter: Payer: Self-pay | Admitting: Internal Medicine

## 2017-09-01 ENCOUNTER — Other Ambulatory Visit: Payer: Self-pay | Admitting: Internal Medicine

## 2017-09-08 NOTE — Telephone Encounter (Signed)
Medication filled to pharmacy as requested.   

## 2017-09-19 ENCOUNTER — Ambulatory Visit (INDEPENDENT_AMBULATORY_CARE_PROVIDER_SITE_OTHER): Payer: BLUE CROSS/BLUE SHIELD | Admitting: *Deleted

## 2017-09-19 DIAGNOSIS — Z23 Encounter for immunization: Secondary | ICD-10-CM | POA: Diagnosis not present

## 2017-09-19 DIAGNOSIS — J3089 Other allergic rhinitis: Secondary | ICD-10-CM | POA: Diagnosis not present

## 2017-11-18 ENCOUNTER — Other Ambulatory Visit: Payer: Self-pay | Admitting: Internal Medicine

## 2017-11-27 ENCOUNTER — Other Ambulatory Visit: Payer: Self-pay | Admitting: *Deleted

## 2017-11-27 MED ORDER — METFORMIN HCL ER 500 MG PO TB24: ORAL_TABLET | ORAL | 1 refills | Status: DC

## 2017-12-29 NOTE — Progress Notes (Signed)
Chief Complaint  Patient presents with  . Follow-up    Pt notes no change. Not consistent with BS checks since last OV    HPI: Nicole Gonzalez 46 y.o. 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  To be surgically removed    Getting beack on track. ROS: See pertinent positives and negatives per HPI.  this week   Know sugar must be up some taking metformin dialy and only taking 1000 on weekends  At this time.   Getting back pain  Now   Trying to exercise poss from ll discrepancy  In past inserts from podiatry were painful so not using  . Past Medical History:  Diagnosis Date  . Anemia     better after hysterectomy  . Cancer (HCC)    basil cell carcinoma/forehead  . DIABETES MELLITUS, GESTATIONAL, HX OF 06/02/2009   Qualifier: Diagnosis of  By: Regis Bill MD, Standley Brooking   . Fracture of lower leg     fall 2011  . GERD (gastroesophageal reflux disease)   . History of basal cell carcinoma excision   . History of gestational diabetes    x2  . HTN (hypertension)   . Hyperlipidemia    [id number] trx   . Increased frequency of headaches 03/15/2012  . Obesity   . Seizures ([place])    as an infant d/t fever  . UNSPECIFIED TACHYCARDIA 09/01/2010   Qualifier: Diagnosis of  By: Charlett Blake MD, Erline Levine      Family History  Problem Relation Age of Onset  . Melanoma Father        x 3 times  . Hypertension Father   . Hyperlipidemia Father   . Squamous cell carcinoma Father   . Colon polyps Father   . Heart disease Father   . Pancreatic cancer Father   . Diabetes Maternal Grandmother   . Hyperlipidemia Maternal Grandmother   . Heart disease Maternal Grandmother   . Arthritis Brother         Psoriatic  . Pancreatic cancer Paternal Grandmother   . Heart disease Paternal Grandfather   . Breast cancer Brother     Social History   Socioeconomic History  . Marital status: Married    Spouse  name: None  . Number of children: 2  . Years of education: None  . Highest education level: None  Social Needs  . Financial resource strain: None  . Food insecurity - worry: None  . Food insecurity - inability: None  . Transportation needs - medical: None  . Transportation needs - non-medical: None  Occupational History  . Occupation: Barista: [employer]  Tobacco Use  . Smoking status: Never Smoker  . Smokeless tobacco: Never Used  Substance and Sexual Activity  . Alcohol use: No  . Drug use: No  . Sexual activity: None  Other Topics Concern  . 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    Nonsmoker married    Outpatient Medications Prior to Visit  Medication Sig Dispense Refill  . BAYER CONTOUR NEXT TEST test strip Use as instructed 100 each  11  . fexofenadine (ALLEGRA) 180 MG tablet Take 180 mg by mouth daily. In the morning    . glucose blood (BAYER CONTOUR TEST) test strip Test 1-2 times daily Dx E11.9 100 each 12  . Lancets MISC Use Bayer Contour Next One lancets to test blood sugar 1-2 times daily 100 each 12  . levocetirizine (XYZAL) 5 MG tablet Take 5 mg by mouth every evening.     Marland Kitchen lisinopril (PRINIVIL,ZESTRIL) 10 MG tablet TAKE 1 TABLET BY MOUTH DAILY FOR HIGH BLOOD PRESSURE 90 tablet 0  . metFORMIN (GLUCOPHAGE-XR) 500 MG 24 hr tablet TAKE 2 TABLET BY MOUTH DAILY WITH BREAKFAST. GENERIC EQUIVALENT FOR GLUCOPHAGE XR 180 tablet 1  . montelukast (SINGULAIR) 10 MG tablet Take 10 mg by mouth daily.     Marland Kitchen omeprazole (PRILOSEC) 20 MG capsule Take 20 mg by mouth daily.     Norvel Richards 80 MCG/ACT AERS Place 2 sprays into the nose daily.     . simvastatin (ZOCOR) 40 MG tablet TAKE 1 TABLET BY MOUTH DAILY AT BEDTIME 90 tablet 0  . lisinopril (PRINIVIL,ZESTRIL) 10 MG tablet TAKE 1 TABLET BY MOUTH DAILY FOR HIGH BLOOD PRESSURE  (Patient not taking: Reported on 01/01/2018) 90 tablet 0  . simvastatin (ZOCOR) 40 MG tablet TAKE 1 TABLET BY MOUTH DAILY AT BEDTIME (Patient not taking: Reported on 01/01/2018) 90 tablet 0   No facility-administered medications prior to visit.      EXAM:  BP 108/76 (BP Location: Right Arm, Patient Position: Sitting, Cuff Size: Normal)   Pulse 87   Temp 98.3 F (36.8 C) (Oral)   Wt 207 lb 3.2 oz (94 kg)   BMI 36.70 kg/m   Body mass index is 36.7 kg/m.  GENERAL: vitals reviewed and listed above, alert, oriented, appears well hydrated and in no acute distress HEENT: atraumatic, conjunctiva  clear, no obvious abnormalities on inspection of external nose and ears NECK: no obvious masses on inspection palpation  LUNGS: clear to auscultation bilaterally, no wheezes, rales or rhonchi, good air movement CV: HRRR, no clubbing cyanosis or  peripheral edema nl cap refill  Abdomen:  Sof,t normal bowel sounds without hepatosplenomegaly, no guarding rebound or masses no CVA tenderness MS: moves all extremities without noticeable focal  abnormality PSYCH: pleasant and cooperative, no obvious depression or anxiety Lab Results  Component Value Date   WBC 6.5 03/29/2017   HGB 14.1 03/29/2017   HCT 42.0 03/29/2017   PLT 230.0 03/29/2017   GLUCOSE 154 (H) 03/29/2017   CHOL 139 07/28/2017   TRIG 98.0 07/28/2017   HDL 42.70 07/28/2017   LDLDIRECT 172.4 10/02/2007   LDLCALC 77 07/28/2017   ALT 21 03/29/2017   AST 14 03/29/2017   NA 138 03/29/2017   K 4.3 03/29/2017   CL 104 03/29/2017   CREATININE 0.59 03/29/2017   BUN 17 03/29/2017   CO2 25 03/29/2017   TSH 1.89 03/29/2017   HGBA1C 7.0 01/01/2018   MICROALBUR 1.1 03/22/2016   BP Readings from Last 3 Encounters:  01/01/18 108/76  08/04/17 110/70  04/05/17 98/70   Wt Readings from Last 3 Encounters:  01/01/18 207 lb 3.2 oz (94 kg)  08/04/17 206 lb 9.6 oz (93.7 kg)  04/05/17 208 lb 4.8 oz (94.5 kg)    ASSESSMENT AND  PLAN:  Discussed the following assessment and plan:  Diabetes mellitus without complication (HCC) - deterioration from life evenets and not taking full dose metformin   side effects.  - Plan: Basic metabolic panel, CBC  with Differential/Platelet, Hemoglobin A1c, Hepatic function panel, TSH, Lipid panel, Microalbumin / creatinine urine ratio, POC HgB A1c  Medication management - Plan: Basic metabolic panel, CBC with Differential/Platelet, Hemoglobin A1c, Hepatic function panel, TSH, Lipid panel, POC HgB A1c  Leg length discrepancy - poss causing back pain interfereing with lsi for her dm weight etc optinos disc refer to SM  - Plan: Ambulatory referral to Sports Medicine  Essential hypertension - Plan: Basic metabolic panel, CBC with Differential/Platelet, Hemoglobin A1c, Hepatic function panel, TSH, Lipid panel  Hyperlipidemia, unspecified hyperlipidemia type  Visit for preventive health examination - Plan: Basic metabolic panel, CBC with Differential/Platelet, Hemoglobin A1c, Hepatic function panel, TSH, Lipid panel cpx and monitoring labs pre visit   Total visit 37mins > 50% spent counseling and coordinating care as indicated in above note and in instructions to patient .     -Patient advised to return or notify health care team  if  new concerns arise.  Patient Instructions  Plan  Try metformin twie a day with a meal to see if tolerate .  If not we can try the 750xr   other options as discussed .   cpx and labs in 3-4 months or as needed  Make appt   Lab  Orders put in today  Pre visit     Standley Brooking. Oneisha Ammons M.D.

## 2018-01-01 ENCOUNTER — Encounter: Payer: Self-pay | Admitting: Internal Medicine

## 2018-01-01 ENCOUNTER — Ambulatory Visit: Payer: BLUE CROSS/BLUE SHIELD | Admitting: Internal Medicine

## 2018-01-01 VITALS — BP 108/76 | HR 87 | Temp 98.3°F | Wt 207.2 lb

## 2018-01-01 DIAGNOSIS — I10 Essential (primary) hypertension: Secondary | ICD-10-CM | POA: Diagnosis not present

## 2018-01-01 DIAGNOSIS — E785 Hyperlipidemia, unspecified: Secondary | ICD-10-CM | POA: Diagnosis not present

## 2018-01-01 DIAGNOSIS — E119 Type 2 diabetes mellitus without complications: Secondary | ICD-10-CM

## 2018-01-01 DIAGNOSIS — M2170 Unequal limb length (acquired), unspecified site: Secondary | ICD-10-CM

## 2018-01-01 DIAGNOSIS — Z0000 Encounter for general adult medical examination without abnormal findings: Secondary | ICD-10-CM | POA: Diagnosis not present

## 2018-01-01 DIAGNOSIS — Z79899 Other long term (current) drug therapy: Secondary | ICD-10-CM | POA: Diagnosis not present

## 2018-01-01 LAB — HM DIABETES EYE EXAM

## 2018-01-01 LAB — POCT GLYCOSYLATED HEMOGLOBIN (HGB A1C): Hemoglobin A1C: 7

## 2018-01-01 NOTE — Patient Instructions (Addendum)
Plan  Try metformin twie a day with a meal to see if tolerate .  If not we can try the 750xr   other options as discussed .   cpx and labs in 3-4 months or as needed  Make appt   Lab  Orders put in today  Pre visit

## 2018-01-05 ENCOUNTER — Encounter: Payer: Self-pay | Admitting: Sports Medicine

## 2018-01-05 ENCOUNTER — Ambulatory Visit: Payer: BLUE CROSS/BLUE SHIELD | Admitting: Sports Medicine

## 2018-01-05 ENCOUNTER — Ambulatory Visit (INDEPENDENT_AMBULATORY_CARE_PROVIDER_SITE_OTHER): Payer: BLUE CROSS/BLUE SHIELD

## 2018-01-05 VITALS — BP 132/82 | HR 95 | Ht 63.0 in | Wt 208.8 lb

## 2018-01-05 DIAGNOSIS — M869 Osteomyelitis, unspecified: Secondary | ICD-10-CM

## 2018-01-05 DIAGNOSIS — M2170 Unequal limb length (acquired), unspecified site: Secondary | ICD-10-CM

## 2018-01-05 DIAGNOSIS — M545 Low back pain: Secondary | ICD-10-CM | POA: Diagnosis not present

## 2018-01-05 DIAGNOSIS — M24559 Contracture, unspecified hip: Secondary | ICD-10-CM | POA: Diagnosis not present

## 2018-01-05 DIAGNOSIS — M5136 Other intervertebral disc degeneration, lumbar region: Secondary | ICD-10-CM | POA: Diagnosis not present

## 2018-01-05 NOTE — Progress Notes (Signed)
Nicole Gonzalez. Nicole Gonzalez, [name] at [facility] [phone]  Nicole Gonzalez - 47 y.o. female MRN [id number]  Date of birth: 10/03/1971  Visit Date: 01/05/2018  PCP: Burnis Medin, MD   Referred by: Burnis Medin, MD   Scribe for today's visit: Josepha Pigg, CMA     SUBJECTIVE:  Nicole Gonzalez is here for New Patient (Initial Visit) (RT hip and lower back pain, leg length discrepancy) .  Referred by: Dr. Shanon Ace Her low back/hip pain symptoms INITIALLY: Began several years ago but has gotten worse over the past few months. PCP thought it could be related to leg length discrepancy.  Described as mild "annoying" ache, radiating to RT leg, pain does not go past the knee.  Worsened with age and weight gain. Pain is also worse with prolonged time of feet and when not wearing shoes.  Improved with putting pressure on lower back/RT hip and massaging.  Additional associated symptoms include: RT hip pain, feels like she has to stand with right leg out. She sleeps on RT ride. She has occasional shooting from RT hip into RT leg that started after having hysterectomy.     At this time symptoms are worsening compared to onset  She has tried inserts/lift from podiatry in the past but doesn't wear them because they are painful. She has hx of sciatic nerve pain when she was pregnant, saw PT,  and that's when she found out that 1 leg is 1/8" longer than the other.   No recent xray of hip or lower back.    ROS Denies night time disturbances. Denies fevers, chills, or night sweats. Denies unexplained weight loss. Reports personal history of cancer, basal cell carcinoma. Denies changes in bowel or bladder habits. Denies recent unreported falls. Denies new or worsening dyspnea or wheezing. Denies headaches or dizziness.  Denies numbness, tingling or weakness  In the extremities.  Denies dizziness or presyncopal episodes Denies lower extremity edema       HISTORY & PERTINENT PRIOR DATA:  Prior History reviewed and updated per electronic medical record.  Significant history, findings, studies and interim changes include:  reports that  has never smoked. she has never used smokeless tobacco. Recent Labs    03/29/17 0801 07/28/17 0805 01/01/18 0842  HGBA1C 6.8* 6.7* 7.0   No specialty comments available. Problem  Osteitis Pubis (Hcc)  Leg Length Discrepancy   Wears insert     OBJECTIVE:  VS:  HT:5\' 3"  (160 cm)   WT:208 lb 12.8 oz (94.7 kg)  BMI:37    BP:132/82  HR:95bpm  TEMP: ( )  RESP:95 %   PHYSICAL EXAM: Constitutional: WDWN, Non-toxic appearing. Psychiatric: Alert & appropriately interactive.  Not depressed or anxious appearing. Respiratory: No increased work of breathing.  Trachea Midline Eyes: Pupils are equal.  EOM intact without nystagmus.  No scleral icterus  NEUROVASCULAR exam: No clubbing or cyanosis appreciated No significant venous stasis changes Capillary Refill: normal, less than 2 seconds   LOWER Extremities  Pre-tibial edema: No significant pretibial edema Pedal Pulses: Normal & symmetrically palpable Dermatomes intact to light touch Normal strength in all myotomes Reflexes: Normal & symmetric DTRs   Bilateral lower extremity is overall well aligned.  She has no pain straight leg raise.  She has pain with resisted hip adduction and small amount of crepitation directly in this area.  Marked focal pain directly over the pubic symphysis.  She has tight hip flexors right  greater than left.  There is a leg length discrepancy while laying flat but does correct with sitting.  She is able to heel and toe walk without significant difficulties.  No additional findings.   ASSESSMENT & PLAN:   1. Leg length discrepancy   2. 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 Pelvic core stabilization is cornerstone of treatment for this and therapeutic exercises with focus on hip flexor stretching and strengthening were provided today.  Would like for her to begin perform these on an almost daily basis and plan to follow-up in 6 weeks to see how she is improving.  She has noted degenerative changes of her lower lumbar facets but this is mild in nature and not likely related to her underlying issues.  Leg length discrepancy This seems to be more functional in nature given the fact that it does correct with sitting.  She does have significant like tight hip flexors which will cause a pelvic rotation that can cause a pseudo-leg length discrepancy and I suspect this is more of her issue.  I would like to see how she does with the therapeutic exercises and recommend no further shimming at this time.  Okay to maintain what she already has built into the shoe.   ++++++++++++++++++++++++++++++++++++++++++++ Orders & Meds: Orders Placed This Encounter  Procedures  . DG Lumbar Spine 2-3 Views  . DG Pelvis 1-2 Views    No orders of the defined types were placed in this encounter.   ++++++++++++++++++++++++++++++++++++++++++++ Follow-up: Return in about 6 weeks (around 02/16/2018).    Pertinent documentation may be included in additional procedure notes, imaging studies, problem based documentation and patient instructions. Please see these sections of the encounter for additional information regarding this visit. CMA/ATC served as Education administrator during this visit. History, Physical, and Plan performed by medical provider. Documentation and orders reviewed and attested to.      Gerda Diss, [place] [facility] Physician

## 2018-01-05 NOTE — Patient Instructions (Addendum)
Try the exercises below on a daily basis.  Interspersing and throughout the day is also helpful.  If you have the soreness as he began with these it is okay to take Tylenol or ibuprofen for very short period but he hopefully should not need these.  If you are sore it is your body's way of saying that you  Slightly over did it and you should cut back slightly on the exercises the following day  Also check out [employer]" which is a program developed by Dr. Minerva Ends.   There are links to a couple of his YouTube Videos below and I would like to see performing one of his videos 5-6 days per week.    A good intro video is: "Independence from Pain 7-minute Video" - [email or web address].com   His more advanced video is: "Powerful Posture and Pain Relief: 12 minutes of Foundation Training" - https://youtu.be/[email or web address]  Do not try to attempt this entire video when first beginning.    Try breaking of each exercise that he goes into shorter segments.  Otherwise if they perform an exercise for 45 seconds, start with 15 seconds and rest and then resume when they begin the new activity.    If you work your way up to doing this 12 minute video, I expect you will see significant improvements in your pain.  If you enjoy his videos and would like to find out more you can look on his website: [email or web address]  He has a workout streaming option as well as a DVD set available for purchase.  Amazon has the best price for his DVDs.

## 2018-01-08 NOTE — Progress Notes (Signed)
Positive for Osteitis pubis

## 2018-01-16 ENCOUNTER — Encounter: Payer: Self-pay | Admitting: Sports Medicine

## 2018-01-16 NOTE — Assessment & Plan Note (Signed)
This seems to be more functional in nature given the fact that it does correct with sitting.  She does have significant like tight hip flexors which will cause a pelvic rotation that can cause a pseudo-leg length discrepancy and I suspect this is more of her issue.  I would like to see how she does with the therapeutic exercises and recommend no further shimming at this time.  Okay to maintain what she already has built into the shoe.

## 2018-01-16 NOTE — Assessment & Plan Note (Signed)
I believe the majority of her symptoms are coming from symptomatic osteitis pubis. Pelvic core stabilization is cornerstone of treatment for this and therapeutic exercises with focus on hip flexor stretching and strengthening were provided today.  Would like for her to begin perform these on an almost daily basis and plan to follow-up in 6 weeks to see how she is improving.  She has noted degenerative changes of her lower lumbar facets but this is mild in nature and not likely related to her underlying issues.

## 2018-01-17 DIAGNOSIS — D1801 Hemangioma of skin and subcutaneous tissue: Secondary | ICD-10-CM | POA: Diagnosis not present

## 2018-01-17 DIAGNOSIS — L814 Other melanin hyperpigmentation: Secondary | ICD-10-CM | POA: Diagnosis not present

## 2018-01-17 DIAGNOSIS — L821 Other seborrheic keratosis: Secondary | ICD-10-CM | POA: Diagnosis not present

## 2018-01-17 DIAGNOSIS — Z85828 Personal history of other malignant neoplasm of skin: Secondary | ICD-10-CM | POA: Diagnosis not present

## 2018-02-16 ENCOUNTER — Ambulatory Visit: Payer: BLUE CROSS/BLUE SHIELD | Admitting: Sports Medicine

## 2018-02-19 ENCOUNTER — Other Ambulatory Visit: Payer: Self-pay | Admitting: Internal Medicine

## 2018-03-26 ENCOUNTER — Encounter: Payer: Self-pay | Admitting: Internal Medicine

## 2018-04-05 ENCOUNTER — Encounter: Payer: Self-pay | Admitting: Internal Medicine

## 2018-04-05 DIAGNOSIS — Z8481 Family history of carrier of genetic disease: Secondary | ICD-10-CM

## 2018-04-05 NOTE — Telephone Encounter (Signed)
FYI Pt is scheduled for 04/10/18

## 2018-04-06 DIAGNOSIS — Z8481 Family history of carrier of genetic disease: Secondary | ICD-10-CM | POA: Insufficient documentation

## 2018-04-06 NOTE — Telephone Encounter (Signed)
IPlease refer referral to genetic counseling for   fam hx of melanoma atm and  father has cdkn2a mutation

## 2018-04-06 NOTE — Addendum Note (Signed)
Addended by: Virl Cagey on: 04/06/2018 11:33 AM   Modules accepted: Orders

## 2018-04-09 NOTE — Progress Notes (Signed)
Chief Complaint  Patient presents with  . Annual Exam    needs referral, left hear feels stopped up, PT says it feels like fluid stuck in the ear,    HPI: Patient  Nicole Gonzalez  47 y.o. comes in today for Preventive Health Care visit  BG:   Not recent checking.  No numbness vision change bp ok  Left ear feels clogged no  Pain fever trauma  Has some allergy   See message fam hx fo gene mutation and bro with breast cancer   Sending genetic counseling  Referral  .   Health Maintenance  Topic Date Due  . HIV Screening  03/06/1985  . PAP SMEAR  03/26/2016  . FOOT EXAM  04/05/2018  . COLONOSCOPY  05/03/2018  . HEMOGLOBIN A1C  07/01/2018  . INFLUENZA VACCINE  07/12/2018  . TETANUS/TDAP  11/18/2018  . OPHTHALMOLOGY EXAM  01/01/2019  . PNEUMOCOCCAL POLYSACCHARIDE VACCINE (2) 03/26/2020   Health Maintenance Review LIFESTYLE:  Exercise:   Week  3 on bike   Routine.  30 min every day .  Tobacco/ETS: no Alcohol:  None  Sugar beverages: chick filet   Lemonade.  Sleep:  6- 7   Drug use: no HH of  3.5   4.5 financially     No pets  Work: ok 40 . Only   ROS:  GEN/ HEENT: No fever, significant weight changes sweats headaches vision problems  CV/ PULM; No chest pain shortness of breath cough, syncope,edema  change in exercise tolerance. GI /GU: No adominal pain, vomiting, change in bowel habits. No blood in the stool. No significant GU symptoms. SKIN/HEME: ,no acute skin rashes suspicious lesions or bleeding. No lymphadenopathy, nodules, masses.  NEURO/ PSYCH:  No neurologic signs such as weakness numbness. No depression anxiety. IMM/ Allergy: No unusual infections.  Allergy .   REST of 12 system review negative except as per HPI   Past Medical History:  Diagnosis Date  . Anemia     better after hysterectomy  . Cancer (HCC)    basil cell carcinoma/forehead  . DIABETES MELLITUS, GESTATIONAL, HX OF 06/02/2009   Qualifier: Diagnosis of  By: Regis Bill MD, Standley Brooking   . Fracture of  lower leg     fall 2011  . GERD (gastroesophageal reflux disease)   . History of basal cell carcinoma excision   . History of gestational diabetes    x2  . HTN (hypertension)   . Hyperlipidemia    [id number] trx   . Increased frequency of headaches 03/15/2012  . Obesity   . Seizures ([place])    as an infant d/t fever  . UNSPECIFIED TACHYCARDIA 09/01/2010   Qualifier: Diagnosis of  By: Charlett Blake MD, Erline Levine      Past Surgical History:  Procedure Laterality Date  . ABDOMINAL HYSTERECTOMY  06/29/2010    question had abnormal cells on Path, fibroids  . BASAL CELL CARCINOMA EXCISION    . BREAST BIOPSY Right 2009  . TONSILLECTOMY      Family History  Problem Relation Age of Onset  . Melanoma Father        x 3 times  . Hypertension Father   . Hyperlipidemia Father   . Squamous cell carcinoma Father   . Colon polyps Father   . Heart disease Father   . Pancreatic cancer Father   . Diabetes Maternal Grandmother   . Hyperlipidemia Maternal Grandmother   . Heart disease Maternal Grandmother   . Arthritis Brother  Psoriatic  . Pancreatic cancer Paternal Grandmother   . Heart disease Paternal Grandfather   . Breast cancer Brother     Social History   Socioeconomic History  . Marital status: Married    Spouse name: Not on file  . Number of children: 2  . Years of education: Not on file  . Highest education level: Not on file  Occupational History  . Occupation: Barista: [employer]  . Financial resource strain: Not on file  . Food insecurity:    Worry: Not on file    Inability: Not on file  . Transportation needs:    Medical: Not on file    Non-medical: Not on file  Tobacco Use  . Smoking status: Never Smoker  . Smokeless tobacco: Never Used  Substance and Sexual Activity  . Alcohol use: No  . Drug use: No  . Sexual activity: Not on file  Lifestyle  . Physical activity:    Days per week: Not on file    Minutes per  session: Not on file  . Stress: Not on file  Relationships  . Social connections:    Talks on phone: Not on file    Gets together: Not on file    Attends religious service: Not on file    Active member of club or organization: Not on file    Attends meetings of clubs or organizations: Not on file    Relationship status: Not on file  Other Topics Concern  . 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    Nonsmoker married    Outpatient Medications Prior to Visit  Medication Sig Dispense Refill  . BAYER CONTOUR NEXT TEST test strip Use as instructed 100 each 11  . fexofenadine (ALLEGRA) 180 MG tablet Take 180 mg by mouth daily. In the morning    . glucose blood (BAYER CONTOUR TEST) test strip Test 1-2 times daily Dx E11.9 100 each 12  . Lancets MISC Use Bayer Contour Next One lancets to test blood sugar 1-2 times daily 100 each 12  . levocetirizine (XYZAL) 5 MG tablet Take 5 mg by mouth every evening.     Marland Kitchen lisinopril (PRINIVIL,ZESTRIL) 10 MG tablet TAKE 1 TABLET BY MOUTH DAILY FOR HIGH BLOOD PRESSURE 90 tablet 0  . metFORMIN (GLUCOPHAGE-XR) 500 MG 24 hr tablet TAKE 2 TABLET BY MOUTH DAILY WITH BREAKFAST. GENERIC EQUIVALENT FOR GLUCOPHAGE XR 180 tablet 1  . montelukast (SINGULAIR) 10 MG tablet Take 10 mg by mouth daily.     Marland Kitchen omeprazole (PRILOSEC) 20 MG capsule Take 20 mg by mouth daily.     Norvel Richards 80 MCG/ACT AERS Place 2 sprays into the nose daily.     . simvastatin (ZOCOR) 40 MG tablet TAKE 1 TABLET BY MOUTH DAILY AT BEDTIME 90 tablet 0   No facility-administered medications prior to visit.      EXAM:  BP 122/80 (BP Location: Left Arm, Patient Position: Sitting, Cuff Size: Large)   Pulse 85   Temp 98 F (36.7 C) (Oral)   Ht 5' 3.5" (1.613 m)   Wt 208 lb 3.2 oz (94.4 kg)   BMI 36.30 kg/m   Body mass index is 36.3 kg/m. Wt Readings  from Last 3  Encounters:  04/10/18 208 lb 3.2 oz (94.4 kg)  01/05/18 208 lb 12.8 oz (94.7 kg)  01/01/18 207 lb 3.2 oz (94 kg)    Physical Exam: Vital signs reviewed HRC:BULA is a well-developed well-nourished alert cooperative    who appearsr stated age in no acute distress.  HEENT: normocephalic atraumatic , Eyes: PERRL EOM's full, conjunctiva clear, Nares: paten,t no deformity discharge or tenderness., Ears: no deformity EAC's left occluded with wax  After irrigation  TMs with normal landmarks.  Hearing feels better Mouth: clear OP, no lesions, edema.  Moist mucous membranes. Dentition in adequate repair. NECK: supple without masses, thyromegaly or bruits. CHEST/PULM:  Clear to auscultation and percussion breath sounds equal no wheeze , rales or rhonchi. No chest wall deformities or tenderness. Breast: normal by inspection . No dimpling, discharge, masses, tenderness or discharge . CV: PMI is nondisplaced, S1 S2 no gallops, murmurs, rubs. Peripheral pulses are full without delay.No JVD .  ABDOMEN: Bowel sounds normal nontender  No guard or rebound, no hepato splenomegal no CVA tenderness.  No hernia. Extremtities:  No clubbing cyanosis or edema, no acute joint swelling or redness no focal atrophy NEURO:  Oriented x3, cranial nerves 3-12 appear to be intact, no obvious focal weakness,gait within normal limits no abnormal reflexes or asymmetrical SKIN: No acute rashes normal turgor, color, no bruising or petechiae. PSYCH: Oriented, good eye contact, no obvious depression anxiety, cognition and judgment appear normal. LN: no cervical axillary inguinal adenopathy Diabetic Foot Exam - Simple   Simple Foot Form Diabetic Foot exam was performed with the following findings:  Yes 04/10/2018  9:54 AM  Visual Inspection No deformities, no ulcerations, no other skin breakdown bilaterally:  Yes See comments:  Yes Sensation Testing Intact to touch and monofilament testing bilaterally:  Yes Pulse Check Posterior  Tibialis and Dorsalis pulse intact bilaterally:  Yes Comments Small callous r foot no lesion     Lab Results  Component Value Date   WBC 6.5 03/29/2017   HGB 14.1 03/29/2017   HCT 42.0 03/29/2017   PLT 230.0 03/29/2017   GLUCOSE 154 (H) 03/29/2017   CHOL 139 07/28/2017   TRIG 98.0 07/28/2017   HDL 42.70 07/28/2017   LDLDIRECT 172.4 10/02/2007   LDLCALC 77 07/28/2017   ALT 21 03/29/2017   AST 14 03/29/2017   NA 138 03/29/2017   K 4.3 03/29/2017   CL 104 03/29/2017   CREATININE 0.59 03/29/2017   BUN 17 03/29/2017   CO2 25 03/29/2017   TSH 1.89 03/29/2017   HGBA1C 7.0 01/01/2018   MICROALBUR 1.1 03/22/2016    BP Readings from Last 3 Encounters:  04/10/18 122/80  01/05/18 132/82  01/01/18 108/76      ASSESSMENT AND PLAN:  Discussed the following assessment and plan:  Visit for preventive health examination - Plan: Basic metabolic panel, CBC with Differential/Platelet, Hemoglobin A1c, Hepatic function panel, Lipid panel, TSH, Microalbumin / creatinine urine ratio  Diabetes mellitus without complication ([place]) - Plan: Basic metabolic panel, CBC with Differential/Platelet, Hemoglobin A1c, Hepatic function panel, Lipid panel, TSH, Microalbumin / creatinine urine ratio  Medication management - Plan: Basic metabolic panel, CBC with Differential/Platelet, Hemoglobin A1c, Hepatic function panel, Lipid panel, TSH, Microalbumin / creatinine urine ratio  Essential hypertension - Plan: Basic metabolic panel, CBC with Differential/Platelet, Hemoglobin A1c, Hepatic function panel, Lipid panel, TSH, Microalbumin / creatinine urine ratio  Hyperlipidemia, unspecified hyperlipidemia type - Plan: Basic metabolic panel, CBC with Differential/Platelet, Hemoglobin A1c, Hepatic function panel, Lipid  panel, TSH, Microalbumin / creatinine urine ratio  Excessive ear wax, left - Plan: Basic metabolic panel, CBC with Differential/Platelet, Hemoglobin A1c, Hepatic function panel, Lipid panel, TSH,  Microalbumin / creatinine urine ratio  Family history of gene mutation  Family history of cancer Counseled. Ongoing   Reviewed and meds  Checking a1c today   Plan 6 mos fu  Or depending on results of labs  Patient Care Team: Kashis Penley, Standley Brooking, MD as PCP - General Gwendolyn Fill, MD as Attending Physician (Obstetrics and Gynecology) Tiajuana Amass, MD (Allergy) Patient Instructions  Continue lifestyle intervention healthy eating and exercise . Will notify you  of labs when available.   Plan genetic counseling referral as planned .   rov in 6 months depending on lab results   Health Maintenance, Female Adopting a healthy lifestyle and getting preventive care can go a long way to promote health and wellness. Talk with your health care provider about what schedule of regular examinations is right for you. This is a good chance for you to check in with your provider about disease prevention and staying healthy. In between checkups, there are plenty of things you can do on your own. Experts have done a lot of research about which lifestyle changes and preventive measures are most likely to keep you healthy. Ask your health care provider for more information. Weight and diet Eat a healthy diet  Be sure to include plenty of vegetables, fruits, low-fat dairy products, and lean protein.  Do not eat a lot of foods high in solid fats, added sugars, or salt.  Get regular exercise. This is one of the most important things you can do for your health. ? Most adults should exercise for at least 150 minutes each week. The exercise should increase your heart rate and make you sweat (moderate-intensity exercise). ? Most adults should also do strengthening exercises at least twice a week. This is in addition to the moderate-intensity exercise.  Maintain a healthy weight  Body mass index (BMI) is a measurement that can be used to identify possible weight problems. It estimates body fat based on  height and weight. Your health care provider can help determine your BMI and help you achieve or maintain a healthy weight.  For females 19 years of age and older: ? A BMI below 18.5 is considered underweight. ? A BMI of 18.5 to 24.9 is normal. ? A BMI of 25 to 29.9 is considered overweight. ? A BMI of 30 and above is considered obese.  Watch levels of cholesterol and blood lipids  You should start having your blood tested for lipids and cholesterol at 48 years of age, then have this test every 5 years.  You may need to have your cholesterol levels checked more often if: ? Your lipid or cholesterol levels are high. ? You are older than 48 years of age. ? You are at high risk for heart disease.  Cancer screening Lung Cancer  Lung cancer screening is recommended for adults 53-27 years old who are at high risk for lung cancer because of a history of smoking.  A yearly low-dose CT scan of the lungs is recommended for people who: ? Currently smoke. ? Have quit within the past 15 years. ? Have at least a 30-pack-year history of smoking. A pack year is smoking an average of one pack of cigarettes a day for 1 year.  Yearly screening should continue until it has been 15 years since you quit.  Yearly  screening should stop if you develop a health problem that would prevent you from having lung cancer treatment.  Breast Cancer  Practice breast self-awareness. This means understanding how your breasts normally appear and feel.  It also means doing regular breast self-exams. Let your health care provider know about any changes, no matter how small.  If you are in your 20s or 30s, you should have a clinical breast exam (CBE) by a health care provider every 1-3 years as part of a regular health exam.  If you are 68 or older, have a CBE every year. Also consider having a breast X-ray (mammogram) every year.  If you have a family history of breast cancer, talk to your health care provider  about genetic screening.  If you are at high risk for breast cancer, talk to your health care provider about having an MRI and a mammogram every year.  Breast cancer gene (BRCA) assessment is recommended for women who have family members with BRCA-related cancers. BRCA-related cancers include: ? Breast. ? Ovarian. ? Tubal. ? Peritoneal cancers.  Results of the assessment will determine the need for genetic counseling and BRCA1 and BRCA2 testing.  Cervical Cancer Your health care provider may recommend that you be screened regularly for cancer of the pelvic organs (ovaries, uterus, and vagina). This screening involves a pelvic examination, including checking for microscopic changes to the surface of your cervix (Pap test). You may be encouraged to have this screening done every 3 years, beginning at age 44.  For women ages 51-65, health care providers may recommend pelvic exams and Pap testing every 3 years, or they may recommend the Pap and pelvic exam, combined with testing for human papilloma virus (HPV), every 5 years. Some types of HPV increase your risk of cervical cancer. Testing for HPV may also be done on women of any age with unclear Pap test results.  Other health care providers may not recommend any screening for nonpregnant women who are considered low risk for pelvic cancer and who do not have symptoms. Ask your health care provider if a screening pelvic exam is right for you.  If you have had past treatment for cervical cancer or a condition that could lead to cancer, you need Pap tests and screening for cancer for at least 20 years after your treatment. If Pap tests have been discontinued, your risk factors (such as having a new sexual partner) need to be reassessed to determine if screening should resume. Some women have medical problems that increase the chance of getting cervical cancer. In these cases, your health care provider may recommend more frequent screening and Pap  tests.  Colorectal Cancer  This type of cancer can be detected and often prevented.  Routine colorectal cancer screening usually begins at 48 years of age and continues through 48 years of age.  Your health care provider may recommend screening at an earlier age if you have risk factors for colon cancer.  Your health care provider may also recommend using home test kits to check for hidden blood in the stool.  A small camera at the end of a tube can be used to examine your colon directly (sigmoidoscopy or colonoscopy). This is done to check for the earliest forms of colorectal cancer.  Routine screening usually begins at age 31.  Direct examination of the colon should be repeated every 5-10 years through 47 years of age. However, you may need to be screened more often if early forms of precancerous polyps  or small growths are found.  Skin Cancer  Check your skin from head to toe regularly.  Tell your health care provider about any new moles or changes in moles, especially if there is a change in a mole's shape or color.  Also tell your health care provider if you have a mole that is larger than the size of a pencil eraser.  Always use sunscreen. Apply sunscreen liberally and repeatedly throughout the day.  Protect yourself by wearing long sleeves, pants, a wide-brimmed hat, and sunglasses whenever you are outside.  Heart disease, diabetes, and high blood pressure  High blood pressure causes heart disease and increases the risk of stroke. High blood pressure is more likely to develop in: ? People who have blood pressure in the high end of the normal range (130-139/85-89 mm Hg). ? People who are overweight or obese. ? People who are African American.  If you are 45-26 years of age, have your blood pressure checked every 3-5 years. If you are 27 years of age or older, have your blood pressure checked every year. You should have your blood pressure measured twice-once when you are at  a hospital or clinic, and once when you are not at a hospital or clinic. Record the average of the two measurements. To check your blood pressure when you are not at a hospital or clinic, you can use: ? An automated blood pressure machine at a pharmacy. ? A home blood pressure monitor.  If you are between 47 years and 50 years old, ask your health care provider if you should take aspirin to prevent strokes.  Have regular diabetes screenings. This involves taking a blood sample to check your fasting blood sugar level. ? If you are at a normal weight and have a low risk for diabetes, have this test once every three years after 48 years of age. ? If you are overweight and have a high risk for diabetes, consider being tested at a younger age or more often. Preventing infection Hepatitis B  If you have a higher risk for hepatitis B, you should be screened for this virus. You are considered at high risk for hepatitis B if: ? You were born in a country where hepatitis B is common. Ask your health care provider which countries are considered high risk. ? Your parents were born in a high-risk country, and you have not been immunized against hepatitis B (hepatitis B vaccine). ? You have HIV or AIDS. ? You use needles to inject street drugs. ? You live with someone who has hepatitis B. ? You have had sex with someone who has hepatitis B. ? You get hemodialysis treatment. ? You take certain medicines for conditions, including cancer, organ transplantation, and autoimmune conditions.  Hepatitis C  Blood testing is recommended for: ? Everyone born from [birth year] through 1966. ? Anyone with known risk factors for hepatitis C.  Sexually transmitted infections (STIs)  You should be screened for sexually transmitted infections (STIs) including gonorrhea and chlamydia if: ? You are sexually active and are younger than 48 years of age. ? You are older than 49 years of age and your health care provider tells  you that you are at risk for this type of infection. ? Your sexual activity has changed since you were last screened and you are at an increased risk for chlamydia or gonorrhea. Ask your health care provider if you are at risk.  If you do not have HIV, but are at risk,  it may be recommended that you take a prescription medicine daily to prevent HIV infection. This is called pre-exposure prophylaxis (PrEP). You are considered at risk if: ? You are sexually active and do not regularly use condoms or know the HIV status of your partner(s). ? You take drugs by injection. ? You are sexually active with a partner who has HIV.  Talk with your health care provider about whether you are at high risk of being infected with HIV. If you choose to begin PrEP, you should first be tested for HIV. You should then be tested every 3 months for as long as you are taking PrEP. Pregnancy  If you are premenopausal and you may become pregnant, ask your health care provider about preconception counseling.  If you may become pregnant, take 400 to 800 micrograms (mcg) of folic acid every day.  If you want to prevent pregnancy, talk to your health care provider about birth control (contraception). Osteoporosis and menopause  Osteoporosis is a disease in which the bones lose minerals and strength with aging. This can result in serious bone fractures. Your risk for osteoporosis can be identified using a bone density scan.  If you are 38 years of age or older, or if you are at risk for osteoporosis and fractures, ask your health care provider if you should be screened.  Ask your health care provider whether you should take a calcium or vitamin D supplement to lower your risk for osteoporosis.  Menopause may have certain physical symptoms and risks.  Hormone replacement therapy may reduce some of these symptoms and risks. Talk to your health care provider about whether hormone replacement therapy is right for  you. Follow these instructions at home:  Schedule regular health, dental, and eye exams.  Stay current with your immunizations.  Do not use any tobacco products including cigarettes, chewing tobacco, or electronic cigarettes.  If you are pregnant, do not drink alcohol.  If you are breastfeeding, limit how much and how often you drink alcohol.  Limit alcohol intake to no more than 1 drink per day for nonpregnant women. One drink equals 12 ounces of beer, 5 ounces of wine, or 1 ounces of hard liquor.  Do not use street drugs.  Do not share needles.  Ask your health care provider for help if you need support or information about quitting drugs.  Tell your health care provider if you often feel depressed.  Tell your health care provider if you have ever been abused or do not feel safe at home. This information is not intended to replace advice given to you by your health care provider. Make sure you discuss any questions you have with your health care provider. Document Released: 06/13/2011 Document Revised: 05/05/2016 Document Reviewed: 09/01/2015 Elsevier Interactive Patient Education  2018 [place]. Eli Adami M.D.

## 2018-04-10 ENCOUNTER — Ambulatory Visit (INDEPENDENT_AMBULATORY_CARE_PROVIDER_SITE_OTHER): Payer: BLUE CROSS/BLUE SHIELD | Admitting: Internal Medicine

## 2018-04-10 ENCOUNTER — Encounter: Payer: Self-pay | Admitting: Internal Medicine

## 2018-04-10 VITALS — BP 122/80 | HR 85 | Temp 98.0°F | Ht 63.5 in | Wt 208.2 lb

## 2018-04-10 DIAGNOSIS — E119 Type 2 diabetes mellitus without complications: Secondary | ICD-10-CM | POA: Diagnosis not present

## 2018-04-10 DIAGNOSIS — I10 Essential (primary) hypertension: Secondary | ICD-10-CM

## 2018-04-10 DIAGNOSIS — Z809 Family history of malignant neoplasm, unspecified: Secondary | ICD-10-CM

## 2018-04-10 DIAGNOSIS — Z8481 Family history of carrier of genetic disease: Secondary | ICD-10-CM

## 2018-04-10 DIAGNOSIS — E785 Hyperlipidemia, unspecified: Secondary | ICD-10-CM | POA: Diagnosis not present

## 2018-04-10 DIAGNOSIS — Z0000 Encounter for general adult medical examination without abnormal findings: Secondary | ICD-10-CM | POA: Diagnosis not present

## 2018-04-10 DIAGNOSIS — Z79899 Other long term (current) drug therapy: Secondary | ICD-10-CM | POA: Diagnosis not present

## 2018-04-10 DIAGNOSIS — H6122 Impacted cerumen, left ear: Secondary | ICD-10-CM

## 2018-04-10 LAB — HEPATIC FUNCTION PANEL
ALBUMIN: 4.4 g/dL (ref 3.5–5.2)
ALT: 24 U/L (ref 0–35)
AST: 19 U/L (ref 0–37)
Alkaline Phosphatase: 63 U/L (ref 39–117)
BILIRUBIN DIRECT: 0.1 mg/dL (ref 0.0–0.3)
BILIRUBIN TOTAL: 0.6 mg/dL (ref 0.2–1.2)
Total Protein: 6.8 g/dL (ref 6.0–8.3)

## 2018-04-10 LAB — LIPID PANEL
Cholesterol: 173 mg/dL (ref 0–200)
HDL: 47.7 mg/dL (ref >39.00)
LDL CALC: 107 mg/dL — AB (ref 0–99)
NONHDL: 125.68
Total CHOL/HDL Ratio: 4
Triglycerides: 92 mg/dL (ref 0.0–149.0)
VLDL: 18.4 mg/dL (ref 0.0–40.0)

## 2018-04-10 LAB — CBC WITH DIFFERENTIAL/PLATELET
Basophils Absolute: 0 10*3/uL (ref 0.0–0.1)
Basophils Relative: 0.5 % (ref 0.0–3.0)
EOS PCT: 1.3 % (ref 0.0–5.0)
Eosinophils Absolute: 0.1 10*3/uL (ref 0.0–0.7)
HCT: 41.1 % (ref 36.0–46.0)
HEMOGLOBIN: 14.3 g/dL (ref 12.0–15.0)
Lymphocytes Relative: 24.4 % (ref 12.0–46.0)
Lymphs Abs: 1.6 10*3/uL (ref 0.7–4.0)
MCHC: 34.7 g/dL (ref 30.0–36.0)
MCV: 85.5 fl (ref 78.0–100.0)
MONO ABS: 0.4 10*3/uL (ref 0.1–1.0)
MONOS PCT: 6.2 % (ref 3.0–12.0)
Neutro Abs: 4.4 10*3/uL (ref 1.4–7.7)
Neutrophils Relative %: 67.6 % (ref 43.0–77.0)
Platelets: 215 10*3/uL (ref 150.0–400.0)
RBC: 4.81 Mil/uL (ref 3.87–5.11)
RDW: 12.5 % (ref 11.5–15.5)
WBC: 6.4 10*3/uL (ref 4.0–10.5)

## 2018-04-10 LAB — BASIC METABOLIC PANEL
BUN: 13 mg/dL (ref 6–23)
CO2: 24 mEq/L (ref 19–32)
Calcium: 9.4 mg/dL (ref 8.4–10.5)
Chloride: 104 mEq/L (ref 96–112)
Creatinine, Ser: 0.46 mg/dL (ref 0.40–1.20)
GFR: 154.04 mL/min (ref >60.00)
Glucose, Bld: 162 mg/dL — ABNORMAL HIGH (ref 70–99)
POTASSIUM: 4.4 meq/L (ref 3.5–5.1)
SODIUM: 138 meq/L (ref 135–145)

## 2018-04-10 LAB — MICROALBUMIN / CREATININE URINE RATIO
Creatinine,U: 83.8 mg/dL
Microalb Creat Ratio: 0.8 mg/g (ref 0.0–30.0)
Microalb, Ur: <0.7 mg/dL (ref 0.0–1.9)

## 2018-04-10 LAB — HEMOGLOBIN A1C: HEMOGLOBIN A1C: 8 % — AB (ref 4.6–6.5)

## 2018-04-10 LAB — TSH: TSH: 1.71 u[IU]/mL (ref 0.35–4.50)

## 2018-04-10 NOTE — Patient Instructions (Signed)
Continue lifestyle intervention healthy eating and exercise . Will notify you  of labs when available.   Plan genetic counseling referral as planned .   rov in 6 months depending on lab results   Health Maintenance, Female Adopting a healthy lifestyle and getting preventive care can go a long way to promote health and wellness. Talk with your health care provider about what schedule of regular examinations is right for you. This is a good chance for you to check in with your provider about disease prevention and staying healthy. In between checkups, there are plenty of things you can do on your own. Experts have done a lot of research about which lifestyle changes and preventive measures are most likely to keep you healthy. Ask your health care provider for more information. Weight and diet Eat a healthy diet  Be sure to include plenty of vegetables, fruits, low-fat dairy products, and lean protein.  Do not eat a lot of foods high in solid fats, added sugars, or salt.  Get regular exercise. This is one of the most important things you can do for your health. ? Most adults should exercise for at least 150 minutes each week. The exercise should increase your heart rate and make you sweat (moderate-intensity exercise). ? Most adults should also do strengthening exercises at least twice a week. This is in addition to the moderate-intensity exercise.  Maintain a healthy weight  Body mass index (BMI) is a measurement that can be used to identify possible weight problems. It estimates body fat based on height and weight. Your health care provider can help determine your BMI and help you achieve or maintain a healthy weight.  For females 72 years of age and older: ? A BMI below 18.5 is considered underweight. ? A BMI of 18.5 to 24.9 is normal. ? A BMI of 25 to 29.9 is considered overweight. ? A BMI of 30 and above is considered obese.  Watch levels of cholesterol and blood lipids  You  should start having your blood tested for lipids and cholesterol at 48 years of age, then have this test every 5 years.  You may need to have your cholesterol levels checked more often if: ? Your lipid or cholesterol levels are high. ? You are older than 48 years of age. ? You are at high risk for heart disease.  Cancer screening Lung Cancer  Lung cancer screening is recommended for adults 63-54 years old who are at high risk for lung cancer because of a history of smoking.  A yearly low-dose CT scan of the lungs is recommended for people who: ? Currently smoke. ? Have quit within the past 15 years. ? Have at least a 30-pack-year history of smoking. A pack year is smoking an average of one pack of cigarettes a day for 1 year.  Yearly screening should continue until it has been 15 years since you quit.  Yearly screening should stop if you develop a health problem that would prevent you from having lung cancer treatment.  Breast Cancer  Practice breast self-awareness. This means understanding how your breasts normally appear and feel.  It also means doing regular breast self-exams. Let your health care provider know about any changes, no matter how small.  If you are in your 20s or 30s, you should have a clinical breast exam (CBE) by a health care provider every 1-3 years as part of a regular health exam.  If you are 20 or older, have a CBE  every year. Also consider having a breast X-ray (mammogram) every year.  If you have a family history of breast cancer, talk to your health care provider about genetic screening.  If you are at high risk for breast cancer, talk to your health care provider about having an MRI and a mammogram every year.  Breast cancer gene (BRCA) assessment is recommended for women who have family members with BRCA-related cancers. BRCA-related cancers include: ? Breast. ? Ovarian. ? Tubal. ? Peritoneal cancers.  Results of the assessment will determine the  need for genetic counseling and BRCA1 and BRCA2 testing.  Cervical Cancer Your health care provider may recommend that you be screened regularly for cancer of the pelvic organs (ovaries, uterus, and vagina). This screening involves a pelvic examination, including checking for microscopic changes to the surface of your cervix (Pap test). You may be encouraged to have this screening done every 3 years, beginning at age 16.  For women ages 92-65, health care providers may recommend pelvic exams and Pap testing every 3 years, or they may recommend the Pap and pelvic exam, combined with testing for human papilloma virus (HPV), every 5 years. Some types of HPV increase your risk of cervical cancer. Testing for HPV may also be done on women of any age with unclear Pap test results.  Other health care providers may not recommend any screening for nonpregnant women who are considered low risk for pelvic cancer and who do not have symptoms. Ask your health care provider if a screening pelvic exam is right for you.  If you have had past treatment for cervical cancer or a condition that could lead to cancer, you need Pap tests and screening for cancer for at least 20 years after your treatment. If Pap tests have been discontinued, your risk factors (such as having a new sexual partner) need to be reassessed to determine if screening should resume. Some women have medical problems that increase the chance of getting cervical cancer. In these cases, your health care provider may recommend more frequent screening and Pap tests.  Colorectal Cancer  This type of cancer can be detected and often prevented.  Routine colorectal cancer screening usually begins at 48 years of age and continues through 48 years of age.  Your health care provider may recommend screening at an earlier age if you have risk factors for colon cancer.  Your health care provider may also recommend using home test kits to check for hidden blood  in the stool.  A small camera at the end of a tube can be used to examine your colon directly (sigmoidoscopy or colonoscopy). This is done to check for the earliest forms of colorectal cancer.  Routine screening usually begins at age 51.  Direct examination of the colon should be repeated every 5-10 years through 48 years of age. However, you may need to be screened more often if early forms of precancerous polyps or small growths are found.  Skin Cancer  Check your skin from head to toe regularly.  Tell your health care provider about any new moles or changes in moles, especially if there is a change in a mole's shape or color.  Also tell your health care provider if you have a mole that is larger than the size of a pencil eraser.  Always use sunscreen. Apply sunscreen liberally and repeatedly throughout the day.  Protect yourself by wearing long sleeves, pants, a wide-brimmed hat, and sunglasses whenever you are outside.  Heart disease, diabetes,  and high blood pressure  High blood pressure causes heart disease and increases the risk of stroke. High blood pressure is more likely to develop in: ? People who have blood pressure in the high end of the normal range (130-139/85-89 mm Hg). ? People who are overweight or obese. ? People who are African American.  If you are 18-39 years of age, have your blood pressure checked every 3-5 years. If you are 40 years of age or older, have your blood pressure checked every year. You should have your blood pressure measured twice-once when you are at a hospital or clinic, and once when you are not at a hospital or clinic. Record the average of the two measurements. To check your blood pressure when you are not at a hospital or clinic, you can use: ? An automated blood pressure machine at a pharmacy. ? A home blood pressure monitor.  If you are between 55 years and 79 years old, ask your health care provider if you should take aspirin to prevent  strokes.  Have regular diabetes screenings. This involves taking a blood sample to check your fasting blood sugar level. ? If you are at a normal weight and have a low risk for diabetes, have this test once every three years after 48 years of age. ? If you are overweight and have a high risk for diabetes, consider being tested at a younger age or more often. Preventing infection Hepatitis B  If you have a higher risk for hepatitis B, you should be screened for this virus. You are considered at high risk for hepatitis B if: ? You were born in a country where hepatitis B is common. Ask your health care provider which countries are considered high risk. ? Your parents were born in a high-risk country, and you have not been immunized against hepatitis B (hepatitis B vaccine). ? You have HIV or AIDS. ? You use needles to inject street drugs. ? You live with someone who has hepatitis B. ? You have had sex with someone who has hepatitis B. ? You get hemodialysis treatment. ? You take certain medicines for conditions, including cancer, organ transplantation, and autoimmune conditions.  Hepatitis C  Blood testing is recommended for: ? Everyone born from 1945 through 1965. ? Anyone with known risk factors for hepatitis C.  Sexually transmitted infections (STIs)  You should be screened for sexually transmitted infections (STIs) including gonorrhea and chlamydia if: ? You are sexually active and are younger than 48 years of age. ? You are older than 48 years of age and your health care provider tells you that you are at risk for this type of infection. ? Your sexual activity has changed since you were last screened and you are at an increased risk for chlamydia or gonorrhea. Ask your health care provider if you are at risk.  If you do not have HIV, but are at risk, it may be recommended that you take a prescription medicine daily to prevent HIV infection. This is called pre-exposure prophylaxis  (PrEP). You are considered at risk if: ? You are sexually active and do not regularly use condoms or know the HIV status of your partner(s). ? You take drugs by injection. ? You are sexually active with a partner who has HIV.  Talk with your health care provider about whether you are at high risk of being infected with HIV. If you choose to begin PrEP, you should first be tested for HIV. You should then   be tested every 3 months for as long as you are taking PrEP. Pregnancy  If you are premenopausal and you may become pregnant, ask your health care provider about preconception counseling.  If you may become pregnant, take 400 to 800 micrograms (mcg) of folic acid every day.  If you want to prevent pregnancy, talk to your health care provider about birth control (contraception). Osteoporosis and menopause  Osteoporosis is a disease in which the bones lose minerals and strength with aging. This can result in serious bone fractures. Your risk for osteoporosis can be identified using a bone density scan.  If you are 51 years of age or older, or if you are at risk for osteoporosis and fractures, ask your health care provider if you should be screened.  Ask your health care provider whether you should take a calcium or vitamin D supplement to lower your risk for osteoporosis.  Menopause may have certain physical symptoms and risks.  Hormone replacement therapy may reduce some of these symptoms and risks. Talk to your health care provider about whether hormone replacement therapy is right for you. Follow these instructions at home:  Schedule regular health, dental, and eye exams.  Stay current with your immunizations.  Do not use any tobacco products including cigarettes, chewing tobacco, or electronic cigarettes.  If you are pregnant, do not drink alcohol.  If you are breastfeeding, limit how much and how often you drink alcohol.  Limit alcohol intake to no more than 1 drink per day for  nonpregnant women. One drink equals 12 ounces of beer, 5 ounces of wine, or 1 ounces of hard liquor.  Do not use street drugs.  Do not share needles.  Ask your health care provider for help if you need support or information about quitting drugs.  Tell your health care provider if you often feel depressed.  Tell your health care provider if you have ever been abused or do not feel safe at home. This information is not intended to replace advice given to you by your health care provider. Make sure you discuss any questions you have with your health care provider. Document Released: 06/13/2011 Document Revised: 05/05/2016 Document Reviewed: 09/01/2015 Elsevier Interactive Patient Education  [employer].

## 2018-04-13 ENCOUNTER — Other Ambulatory Visit: Payer: Self-pay | Admitting: Internal Medicine

## 2018-04-13 MED ORDER — METFORMIN HCL ER 500 MG PO TB24: ORAL_TABLET | ORAL | 1 refills | Status: DC

## 2018-04-13 NOTE — Telephone Encounter (Signed)
Medication changes made per lab results Patient made aware of this.  Refills placed as "fill later" for new dose of Metformin. Nothing further needed.

## 2018-04-16 ENCOUNTER — Telehealth: Payer: Self-pay | Admitting: Genetics

## 2018-04-16 NOTE — Telephone Encounter (Signed)
Appt has been scheduled for the Nicole Gonzalez to see Ferol Luz on 5/29 at 11am. Nicole Gonzalez aware to arrive 30 minutes early.

## 2018-05-09 ENCOUNTER — Encounter: Payer: Self-pay | Admitting: Genetics

## 2018-05-09 ENCOUNTER — Inpatient Hospital Stay: Payer: BLUE CROSS/BLUE SHIELD

## 2018-05-09 ENCOUNTER — Inpatient Hospital Stay: Payer: BLUE CROSS/BLUE SHIELD | Attending: Genetic Counselor | Admitting: Genetics

## 2018-05-09 DIAGNOSIS — Z808 Family history of malignant neoplasm of other organs or systems: Secondary | ICD-10-CM

## 2018-05-09 DIAGNOSIS — E119 Type 2 diabetes mellitus without complications: Secondary | ICD-10-CM

## 2018-05-09 DIAGNOSIS — Z85828 Personal history of other malignant neoplasm of skin: Secondary | ICD-10-CM

## 2018-05-09 DIAGNOSIS — Z8481 Family history of carrier of genetic disease: Secondary | ICD-10-CM

## 2018-05-09 DIAGNOSIS — Z7183 Encounter for nonprocreative genetic counseling: Secondary | ICD-10-CM

## 2018-05-09 DIAGNOSIS — Z803 Family history of malignant neoplasm of breast: Secondary | ICD-10-CM

## 2018-05-09 DIAGNOSIS — Z800 Family history of malignant neoplasm of digestive organs: Secondary | ICD-10-CM

## 2018-05-09 DIAGNOSIS — Z9889 Other specified postprocedural states: Secondary | ICD-10-CM

## 2018-05-10 ENCOUNTER — Encounter: Payer: Self-pay | Admitting: Genetics

## 2018-05-10 DIAGNOSIS — Z803 Family history of malignant neoplasm of breast: Secondary | ICD-10-CM | POA: Insufficient documentation

## 2018-05-10 DIAGNOSIS — Z808 Family history of malignant neoplasm of other organs or systems: Secondary | ICD-10-CM | POA: Insufficient documentation

## 2018-05-10 DIAGNOSIS — Z800 Family history of malignant neoplasm of digestive organs: Secondary | ICD-10-CM | POA: Insufficient documentation

## 2018-05-10 NOTE — Progress Notes (Signed)
REFERRING PROVIDER: Burnis Medin, MD [place], [place] [address]  PRIMARY PROVIDER:  Burnis Medin, MD  PRIMARY REASON FOR VISIT:  1. Family history of gene mutation   2. Family history of pancreatic cancer   3. Family history of breast cancer   4. Family history of melanoma   5. History of basal cell carcinoma excision   6. Newly diagnosed diabetes ([place])     HISTORY OF PRESENT ILLNESS:   Nicole Gonzalez, a 47 y.o. female, was seen for a [facility] cancer genetics consultation at the request of Dr. 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  Nicole Gonzalez presents to clinic today to   CANCER HISTORY:  Nicole Gonzalez has a history of basal cell carcinoma.  She reports having several other precancerous skin lesions removed.    HORMONAL RISK FACTORS:  Ovaries intact: yes.  Hysterectomy: yes. 2011 for fibroids Colonoscopy: 2014, 2 tubular adenomas, planning next one in Jan 2020;  Mammogram within the last year: yes. Number of breast biopsies: 1.- was just infection  Past Medical History:  Diagnosis Date  . Anemia     better after hysterectomy  . Cancer (HCC)    basil cell carcinoma/forehead  . DIABETES MELLITUS, GESTATIONAL, HX OF 06/02/2009   Qualifier: Diagnosis of  By: Regis Bill MD, Standley Brooking   . Family history of breast cancer   . Family history of genetic disease carrier    father has CDKN2A and ATM mutation  . Family history of melanoma   . Family history of pancreatic cancer   . Fracture of lower leg     fall 2011  . GERD (gastroesophageal reflux disease)   . History of basal cell carcinoma excision   . History of gestational diabetes    x2  . HTN (hypertension)   . Hyperlipidemia    [id number] trx   . Increased frequency of headaches 03/15/2012  . Obesity   . Seizures ([place])    as an infant d/t fever  . UNSPECIFIED TACHYCARDIA 09/01/2010   Qualifier: Diagnosis of  By: Charlett Blake MD, Erline Levine      Past  Surgical History:  Procedure Laterality Date  . ABDOMINAL HYSTERECTOMY  06/29/2010    question had abnormal cells on Path, fibroids  . BASAL CELL CARCINOMA EXCISION    . BREAST BIOPSY Right 2009  . TONSILLECTOMY      Social History   Socioeconomic History  . Marital status: Married    Spouse name: Not on file  . Number of children: 2  . Years of education: Not on file  . Highest education level: Not on file  Occupational History  . Occupation: Barista: [employer]  . Financial resource strain: Not on file  . Food insecurity:    Worry: Not on file    Inability: Not on file  . Transportation needs:    Medical: Not on file    Non-medical: Not on file  Tobacco Use  . Smoking status: Never Smoker  . Smokeless tobacco: Never Used  Substance and Sexual Activity  . Alcohol use: No  . Drug use: No  . Sexual activity: Not on file  Lifestyle  . Physical activity:    Days per week: Not on file    Minutes per session: Not on file  . Stress: Not on file  Relationships  .  Social connections:    Talks on phone: Not on file    Gets together: Not on file    Attends religious service: Not on file    Active member of club or organization: Not on file    Attends meetings of clubs or organizations: Not on file    Relationship status: Not on file  Other Topics Concern  . 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    Nonsmoker married     FAMILY HISTORY:  We obtained a detailed, 4-generation family history.  Significant diagnoses are listed below: Family History  Problem Relation Age of Onset  . Melanoma Father        x 3 times, first in 11's  . Hypertension Father   . Hyperlipidemia Father   . Squamous cell carcinoma Father   . Colon polyps Father   . Heart disease Father   . Pancreatic cancer Father 2        ATM + mutation and + CDKN2A muation  . Diabetes Maternal Grandmother   . Hyperlipidemia Maternal Grandmother   . Heart disease Maternal Grandmother   . Arthritis Brother         Psoriatic  . Pancreatic cancer Paternal Grandmother 36       met liver  . Heart disease Paternal Grandfather   . Breast cancer Brother 68       ATM + mutation  . Skin cancer Maternal Grandfather   . Breast cancer Other   . Skin cancer Cousin    Nicole Gonzalez has a 70 year-old daughter and a son who is in his 79's.  Nicole Gonzalez has 2 brothers.  1 brother is 68, and developed breast caner at 62.  He had genetic testing 2-3 years ago that revealed an ATM pathogenic mutation.  He has 2 daughters and a son.  She has another brother who is 71 and has no history of cancer.  He has a son and a daughter.   Nicole Gonzalez father: has a history of 3 melanomas, the first occurred in his 44's.  He also developed pancreatic cancer at 43.  He had genetic testing in Jan 2019 that revealed an ATM pathogenic variant and a CDKN2A pathogenic variant.  He had a history of smoking.  The test report is currently unavailable for review.   Paternal Aunts/Uncles: 1 paternal aunt in her 10's with no history of cancer.  Paternal cousins: 3 paternal cousins, 1 is deceased, 1 has a history of skin cancer, and the other has no history of cancer.  Paternal grandfather: died of heart disease.   Paternal grandmother:died of pancreatic cancer that metastasized to her liver in her 53's.   Nicole Gonzalez mother: 52, no history of cancer.  She has her uterus and ovaries intact.  Maternal Aunts/Uncles: 3 maternal uncles. 1 died at birth.  The other 2 are in their 26's with no history of cancer.   Maternal cousins: no history of cancer.  Maternal grandfather: deceased, hx of skin caner Maternal grandmother:deceased, no history of cancer.  Her mother (patient's GGM) had breast cancer, but lived to old age.  Patient's maternal ancestors are of Caucasian  descent, and paternal ancestors are of Caucasian descent.  There is no known consanguinity.  GENETIC COUNSELING  ASSESSMENT: Nicole Gonzalez is a 47 y.o. female with a family history of an ATM mutation and CDKN2A mutation- both identified in her father. We, therefore, discussed and recommended the following at today's visit.   ATM: Individuals with an ATM pathogenic variant are at an increased risk to develop breast cancer, and possibly other cancer such as pancreatic cancer and prostate cancer.   Management for individuals with ATM mutations can be found in the NCCN guidelines (v.1.2018). These guidelines recommend the following:  Breast Cancer   Screening: Annual mammogram with consideration of tomosynthesis and consider breast MRI with contrast starting at age 76 years.  Risk Reducing Mastectomy: Evidence insufficient, consider based on family history  Ovarian Cancer  No increased risk for ovarian cancer, therefore no recommendations  Other Cancer Risks  Unknown or insufficient evidence for pancreatic or prostate cancer risk Pancreatic cancer screening can be based on case by case basis with consideration of personal and family history risk factors.    CDKN2A: Specific pathogenic variants in the CDKN2A gene cause a condition called Familial atypical multiple mole melanoma syndrome (FAMMM syndrome.  Individuals with this syndrome typically present with multiple atypical moles, increased risk for melanoma, and increased risk for pancreatic cancer. The lifetime risk for melanoma is estimated to be between 58% to 92%.  The lifetime risk for pancreatic has been estimated to be about 17% by age 59, but this risk appears to vary depending on the specific mutation, and has been reported in the literature to be anywhere from 11%-60%.   This condition is inherited in an autosomal dominant pattern.    Surveillance for these individuals inclues: routine skin exams and monthly self skin exams.   Pancreatic cancer screening (MRCP/EUS)  can be considered in individuals who have a CDKN2A mutation in addition to a family history of pancreatic cancer or other personal risk factors (smoking, diabetes, pancreatitis, etc).   DISCUSSION:  We reviewed the characteristics, features and inheritance patterns of hereditary cancer syndromes. We also discussed genetic testing, including the appropriate family members to test, the process of testing, insurance coverage and turn-around-time for results. We discussed the implications of a negative, positive and/or variant of uncertain significant result.   We discussed the options to pursue single site testing for the known mutations in the family (if we can get relatives's test report) as well as the option to pursue panel genetic testing.  We recommended Nicole Gonzalez pursue genetic testing for the Common Hereditary Cancers gene panel. The Common Hereditary Cancer Panel offered by [facility] includes sequencing and/or deletion duplication testing of the following 47 genes: APC, ATM, AXIN2, BARD1, BMPR1A, BRCA1, BRCA2, BRIP1, CDH1, CDKN2A (p14ARF), CDKN2A (p16INK4a), CKD4, CHEK2, CTNNA1, DICER1, EPCAM (Deletion/duplication testing only), GREM1 (promoter region deletion/duplication testing only), KIT, MEN1, MLH1, MSH2, MSH3, MSH6, MUTYH, NBN, NF1, NHTL1, PALB2, PDGFRA, PMS2, POLD1, POLE, PTEN, RAD50, RAD51C, RAD51D, SDHB, SDHC, SDHD, SMAD4, SMARCA4. STK11, TP53, TSC1, TSC2, and VHL.  The following genes were evaluated for sequence changes only: SDHA and HOXB13 c.251G>A variant only.  There are also many other cancer predisposition syndromes caused by mutations in several other genes.  We discussed that if she is found to have a mutation in one of these genes, it may impact future medical management recommendations such as increased cancer screenings and consideration of risk reducing surgeries.  A positive result could also have implications for the patient's family  members.  A Negative result would mean she did not inherit the familial mutations, and were did  not identify any other hereditary predisposition to cancer. While this result would be very reassuring, there could still be mutations that are undetectable by current technology, or in genes not yet tested or identified to increase cancer risk.    We discussed the potential to find a Variant of Uncertain Significance or VUS.  These are variants that have not yet been identified as pathogenic or benign, and it is unknown if this variant is associated with increased cancer risk or if this is a normal finding.  Most VUS's are reclassified to benign or likely benign.   It should not be used to make medical management decisions. With time, we suspect the lab will determine the significance of any VUS's identified if any.   Based on Nicole Gonzalez's family history of an ATM mutation and CDKN2A mutation, she meets medical criteria for genetic testing. Despite that she meets criteria, she may still have an out of pocket cost. Panel testing is unlikely to be covered by her insurance, however is still an option if she would like to self pay $250.  She elected to self pay for the Common Hereditary Cancers Panel.   PLAN: After considering the risks, benefits, and limitations, Nicole Gonzalez  provided informed consent to pursue genetic testing and the blood sample was sent to [facility] for analysis of the Common Hereditary Cancers Panel. Results should be available within approximately 2-3 weeks' time, at which point they will be disclosed by telephone to Nicole Gonzalez, as will any additional recommendations warranted by these results. Nicole Gonzalez will receive a summary of her genetic counseling visit and a copy of her results once available. This information will also be available in Epic. We encouraged Nicole Gonzalez to remain in contact with cancer genetics annually so that we can continuously update the family history and  inform her of any changes in cancer genetics and testing that may be of benefit for her family. Nicole Gonzalez questions were answered to her satisfaction today. Our contact information was provided should additional questions or concerns arise.  We discussed that some people do not want to undergo genetic testing due to fear of genetic discrimination.  A federal law called the Genetic Information Non-Discrimination Act (GINA) of 2008 helps protect individuals against genetic discrimination based on their genetic test results.  It impacts both health insurance and employment.  For health insurance, it protects against increased premiums, being kicked off insurance or being forced to take a test in order to be insured.  For employment it protects against hiring, firing and promoting decisions based on genetic test results.  Health status due to a cancer diagnosis is not protected under GINA.  This law does not protect life insurance, disability insurance, or other types of insurance.   Based on Nicole Gonzalez's family history, we recommended her brother, nieces, and all other paternal relatives also have genetic counseling and testing. Nicole Gonzalez will let us know if we can be of any assistance in coordinating genetic counseling and/or testing for this family member.   Lastly, we encouraged Nicole Gonzalez to remain in contact with cancer genetics annually so that we can continuously update the family history and inform her of any changes in cancer genetics and testing that may be of benefit for this family.   Ms.  Gonzalez questions were answered to her satisfaction today. Our contact information was provided should additional questions or concerns arise. Thank you for the referral and allowing [place] to share in the care of  your patient.   Tana Felts, MS, [facility] Certified Genetic Counselor [email or web address] phone: [phone]  The patient was seen for a total of 40 minutes in face-to-face genetic  counseling.   This patient was discussed with Drs. Magrinat, Lindi Adie and/or Burr Medico who agrees with the above.

## 2018-05-18 ENCOUNTER — Telehealth: Payer: Self-pay | Admitting: Genetics

## 2018-05-18 NOTE — Telephone Encounter (Addendum)
Revealed positive CDKN2A result (FAMMM Syndrome).  We discussed is mutation is associated with increased risk for melanoma and pancreatic cancer.  Some studies have indicated there may be an increased risk for some other cancers such as lung, larynx, breast, and potentially others.  There is not enough data to confirm these risks definitively, however, more information may be learned about other potential risks in the future.   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  We discussed she should continue her regular skin exams with her dermatologist, and these results will be shared with her dermatologist to follow-up appropriately.  I also recommended she be referred to high risk clinic with Dr. Burr Medico to discuss pancreatic screening.  Ms. Hooser was agreeable to this.  We recommended all of her relatives also have genetic testing.  Her children each have a 50% chance to have inherited this mutation as well.  It is recommended they have genetic testing at some point to determine if they are at risk or not.  It is reccommended that skin exams start by age 47 for individuals with CDKN2A mutations.  However, she reports her children are already seeing a dermatologist very regularly.  Her son is >18 and may consider genetic testing at this time.  Her daughter is only 22, if she is already doing skin exams regularly and her dermatologist knows about this family history, she may not not need to go genetic testing until she is an adult if she continues to get the appropriate skin screening in the mean time.  This allows her the ability to make her own informed decisions about genetic testing as an adult, and to get life insurance, etc before she pursues testing.       Ms. Akkerman also informed us that after speaking with her mother, she found out there was a cousin with kidney cancer.  We do not have a high suspicion, but can ask the lab to analyze some renal cancer genes in addition.

## 2018-05-21 ENCOUNTER — Telehealth: Payer: Self-pay | Admitting: Hematology

## 2018-05-21 ENCOUNTER — Encounter: Payer: Self-pay | Admitting: Genetics

## 2018-05-21 ENCOUNTER — Telehealth: Payer: Self-pay | Admitting: Genetics

## 2018-05-21 DIAGNOSIS — Z1509 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: Secondary | ICD-10-CM

## 2018-05-21 DIAGNOSIS — Z1501 Genetic susceptibility to malignant neoplasm of breast: Secondary | ICD-10-CM | POA: Insufficient documentation

## 2018-05-21 NOTE — Telephone Encounter (Signed)
Informed Nicole Gonzalez of the updated genetics results ( added on renal genes)- these renal cancer/urniary tract genes were normal.   We discussed the ATM VUS is the same one identified in her father.  Most VUS's are actually just normal variant, however there is just not enough information for the lab to classify this definitively. We wold not recommend taking medical action based on a VUS.  We would not consider this ATM a positive result.  If we get any new information about this variant we will notify Nicole Gonzalez.   We discussed the lab offers free testing for the family variants for 90 days.  We recommend her other brother and other relatives have genetic testing as well.  Their insurance is likely to cover at least single site testing after this time frame. We discussed panel options are available for relatives full panel may or may not be covered by insurance, but with medicare we may be able to get it at $0 cost, and self pay options are available ($250).  We discussed if we can confirm this is the same variant that her father has, then we know this mutation came from her paternal side of the family and we would not be concerned for her maternal relatives to carry this mutation.

## 2018-05-21 NOTE — Telephone Encounter (Signed)
Created in error

## 2018-05-21 NOTE — Telephone Encounter (Signed)
Pt has been scheduled to see Dr. Burr Medico on 6/28 at 230pm.

## 2018-05-23 ENCOUNTER — Ambulatory Visit: Payer: Self-pay | Admitting: Genetics

## 2018-05-23 DIAGNOSIS — E119 Type 2 diabetes mellitus without complications: Secondary | ICD-10-CM

## 2018-05-23 DIAGNOSIS — Z85828 Personal history of other malignant neoplasm of skin: Secondary | ICD-10-CM

## 2018-05-23 DIAGNOSIS — Z8481 Family history of carrier of genetic disease: Secondary | ICD-10-CM

## 2018-05-23 DIAGNOSIS — Z800 Family history of malignant neoplasm of digestive organs: Secondary | ICD-10-CM

## 2018-05-23 DIAGNOSIS — Z803 Family history of malignant neoplasm of breast: Secondary | ICD-10-CM

## 2018-05-23 DIAGNOSIS — Z1379 Encounter for other screening for genetic and chromosomal anomalies: Secondary | ICD-10-CM

## 2018-05-23 DIAGNOSIS — Z808 Family history of malignant neoplasm of other organs or systems: Secondary | ICD-10-CM

## 2018-05-23 DIAGNOSIS — Z9889 Other specified postprocedural states: Secondary | ICD-10-CM

## 2018-05-23 NOTE — Progress Notes (Signed)
HPI:  Ms. Dahlem was previously seen in the [facility] clinic on 05/09/2018 due to a family history of a CDKN2A mutation.   Please refer to our prior cancer genetics clinic note for more information regarding Ms. Florido's medical, social and family histories, and our assessment and recommendations, at the time. Ms. Charlie recent genetic test results were disclosed to her, as well as recommendations warranted by these results. These results and recommendations are discussed in more detail below.  CANCER HISTORY:  Ms. Hook has a history of basal cell carcinoma.  She reports having several other precancerous skin lesions removed.   FAMILY HISTORY:  We obtained a detailed, 4-generation family history.  Significant diagnoses are listed below: Family History  Problem Relation Age of Onset  . Melanoma Father        x 3 times, first in 60's  . Hypertension Father   . Hyperlipidemia Father   . Squamous cell carcinoma Father   . Colon polyps Father   . Heart disease Father   . Pancreatic cancer Father 77       CDKN2A muation  . Diabetes Maternal Grandmother   . Hyperlipidemia Maternal Grandmother   . Heart disease Maternal Grandmother   . Arthritis Brother         Psoriatic  . Pancreatic cancer Paternal Grandmother 61       met liver  . Heart disease Paternal Grandfather   . Breast cancer Brother 39       ATM + mutation? or VUS  . Skin cancer Maternal Grandfather   . Breast cancer Other   . Skin cancer Cousin    Ms. Donofrio has a 67 year-old daughter and a son who is in his 75's.  Ms. Goodine has 2 brothers.  1 brother is 58, and developed breast caner at 20.  He had genetic testing 2-3 years ago that she reports showed an ATM mutation pathogenic mutation (however, she is questioning this now that we found her ATM VUS- she is trying to get a copy of her brother's test report so we can confirm if he has an ATM pathogenic variant, or variant of uncertain significance).  He has 2  daughters and a son.  She has another brother who is 50 and has no history of cancer.  He has a son and a daughter.   Ms. Benito father: has a history of 3 melanomas, the first occurred in his 34's.  He also developed pancreatic cancer at 54.  He had genetic testing in Jan 2019 that revealed a CDKN2A pathogenic variant and an ATM Variant of uncertain significance (same one identified in Ms. Manning)  He had a history of smoking.   Paternal Aunts/Uncles: 1 paternal aunt in her 32's with no history of cancer.  Paternal cousins: 3 paternal cousins, 1 is deceased, 1 has a history of skin cancer, and the other has no history of cancer.  Paternal grandfather: died of heart disease.   Paternal grandmother:died of pancreatic cancer that metastasized to her liver in her 57's.   Ms. Muradyan mother: 48, no history of cancer.  She has her uterus and ovaries intact.  Maternal Aunts/Uncles: 3 maternal uncles. 1 died at birth.  The other 2 are in their 63's with no history of cancer.   Maternal cousins: no history of cancer.  Maternal grandfather: deceased, hx of skin caner Maternal grandmother:deceased, no history of cancer.  Her mother (patient's GGM) had breast cancer, but lived to old age.  Patient's maternal ancestors are of Caucasian descent, and paternal ancestors are of Caucasian descent.  There is no known consanguinity.  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   The following genes were evaluated for sequence changes and exonic deletions/duplications: APC, ATM, AXIN2, BAP1, BARD1, BMPR1A, BRCA1, BRCA2, BRIP1, CDC73, CDH1, CDK4, CDKN1C, CDKN2A (p14ARF), CDKN2A (p16INK4a), CHEK2, CTNNA1, DICER1, DIS3L2, EPCAM*, FH, FLCN, GPC3, GREM1*, KIT, MC1R, MEN1, MET, MLH1, MSH2, MSH3, MSH6, MUTYH,  NBN, NF1, PALB2, PDGFRA, PMS2, POLD1, POLE, POT1, PTEN, RAD50, RAD51C, RAD51D, RB1, SDHB, SDHC, SDHD, SMAD4, SMARCA4, SMARCB1, STK11, TERT, TP53, TSC1, TSC2, VHL, WT1 The following genes were evaluated for sequence changes only: HOXB13*, MITF*, NTHL1*, SDHA.  A variant of uncertain significance (VUS) in a gene called ATM was also noted. c.749G>A (p.Arg250Gln)  The test report will be scanned into EPIC and will be located under the Molecular Pathology section of the Results Review tab. A portion of the result report is included below for reference.      We discussed with Ms. Kinley that because current genetic testing is not perfect, it is possible there may be a gene mutation in one of these genes that current testing cannot detect, but that chance is small.  We also discussed, that there could be another gene that has not yet been discovered, or that we have not yet tested, that is responsible for the cancer diagnoses in the family. It is also possible there is a hereditary cause for the cancer in the family that Ms. Greenfield did not inherit and therefore was not identified in her testing.  Therefore, it is important to remain in touch with cancer genetics in the future so that we can continue to offer Ms. Lalli the most up to date genetic testing.   Regarding the VUS in ATM: At this time, it is unknown if this variant is associated with increased cancer risk or if this is a normal finding, but most variants such as this get reclassified to being inconsequential. It should not be used to make medical management decisions. With time, we suspect the lab will determine the significance of this variant, if any. If we do learn more about it, we will try to contact Ms. Printup to discuss it further. However, it is important to stay in touch with [place] periodically and keep the address and phone number up to date.  CDKN2A/Familial Atypical Multiple Mole Melanoma Syndrome: Pathogenic variants in the CDKN2A gene  (p16INK4a) cause a condition called Familial atypical multiple mole melanoma syndrome (FAMMM syndrome.  Individuals with this syndrome typically present with multiple atypical moles, increased risk for melanoma, and increased risk for pancreatic cancer. The lifetime risk for melanoma is estimated to be between 58% to 92%.  The lifetime risk for pancreatic has been estimated to be about 17% by age 39, but this risk appears to vary depending on the specific mutation, and has been reported in the literature to be anywhere from 11%-60%.   This condition is inherited in an autosomal dominant pattern.    Surveillance for these individuals inclues: routine skin exams starting at age 85 (Every 3-6 months initially to ensure nevi stability, but intervals may be individualized over time).   Patient should also be educated about and perform monthly self skin exams and report any abnormalities to their health care provider.  Pancreatic cancer screening (MRCP/EUS)  can be considered in individuals who have a CDKN2A mutation in addition to a family history of pancreatic cancer or other personal risk factors (smoking, diabetes, pancreatitis, etc).  However, the efficacy of pancreatic cancer screening and the survival benefit of this type of screening is currently unknown.   There are currently studies going on, but there is limited data at this time to determine how effective this screening is and if it improves survival.  Therefore, there are no concrete guidelines regarding pancreatic cancer screening.  We recommend Ms. Wurtz discuss the benefits/limitations of pancreatic cancer screening and decide on a plan when she is seen in high risk clinic.   Ms. Bebeau has a family history of pancreatic cancer in her father dx at age 60, and in her paternal grandmother dx in her 47's.  Ms. Badilla denies any history of smoking, and has a personal history of Diabetes mellitus.   The large majority of CDKN2A mutations impact  the protein p16INKa. Which caues FAMMM sumdrp.  However more recently it has been noted, a small number of mutations also impact the other protein CDKN2A creates (P14ARF).  Pathogenic variants that impact the p14ARF protein may cause melanoma-neural system tumor (NST) syndrome (incraesed risk for astrocytoma, nerve sheath tumors, melanoma, and multiple melanocytic nevi).  Ms. Bassford mutation was reported to impact p16INK4a and there is no personal or family history to suggest melanoma-neural system tumor (NST) syndrome.  However, there are rare reports of variants impacting both proteins and causing overlapping phenotypes of FAMMM and NTS.   There is some evidence that individuals with CDKN2A mutations are at increased risk for some other types of cancer including sarcoma, lung, larynx, breast, and others.  However, there is currently not enough data to show significant associations with these other types of cancer risks.   Breast Cancer Risk:  Some clarification is needed about Ms. Monday's brother's genetic testing to accurately assess her breast cancer risk.  If her brother was indeed positive for an ATM mutation, (that Ms. Fricker tested negative for) her risk for breast cancer would be lower than if her brother had a negative genetic test result (ATM VUS).  Her risk also changes based on if she has started menopause or not.   If her brother only has an ATM VUS: approximately 21.2% (premenopaual) , to 20.2% (perimenopausal) 18.4% if postmenopausal.  The patient's lifetime breastlsmith cancer risk is a preliminary estimate based on available information using one of several models endorsed by the [place] (ACS). The ACS recommends consideration of breast MRI screening as an adjunct to mammography for patients at high risk (defined as 20% or greater lifetime risk). A more detailed breast cancer risk assessment can be considered, if clinically indicated.   If after clarifying her brother's  test results and menopausal status, her breast cancer risk is still =/>20% we would recommend that annual screening with mammography and breast MRI begin at age 76, or 10 years prior to the age of breast cancer diagnosis in a relative (whichever is earlier).    HORMONAL RISK FACTORS:  Menarch: 10 Ovaries intact: yes.  Hysterectomy: yes. 2011 for fibroids Colonoscopy: 2014, 2 tubular adenomas, planning next one in Jan 2020;  Mammogram within the last year: yes. Number of breast biopsies: 1.- was just infection  FAMILY MEMBERS: Each of Ms. Haire's children and siblings each have at 50% chance to have inherited this CDKN2A mutation.  We recommend they all have genetic testing to  determine if they carry the familial CDKN2A mutation.  Skin examinations are recommended to begin at the age of 58.  Her children are already going very regularly to the dermatologist for skin exams.  We recommend they both get genetic testing, but given that both of her children are already getting the screening they would need at this time if they were positive for CDKN2A, her youngest daughter could wait until she is >18/young adult to pursue genetic testing (as long as she continues to see dermatology regularly).    Ms. Thiem is going to try to get a copy of her brother's genetic test results to help clarify if there really is an ATM pathogenic variant in the family or if his finding was also an ATM variant of uncertain significance just like her and her father. This information would change our risk assessment for Ms. Bufano and her family.   All other paternal relatives (aunts/uncles/cousins, etc) are also recommended to have genetic testing to determine if they carry the paternal family CDKN2A pathogenic variant.  If we can be of any assistance in coordinating genetic testing for any of these relatives, we encouraged Ms. Beining to have them contact us.    PLAN: -Ms. Highley is scheduled to see Dr. Burr Medico in high risk  clinic in 06/08/2018 to discuss a cancer screening plan.   -Ms. Brun's dermatologist Dr. Camillo Flaming will be notified of her mutation status for dermatology follow-up.    -Ms. Perdew plans to notify her relatives about this mutation and get a copy of her brother's genetic test result to confirm if he has an ATM pathogenic varian or variant of uncertain significance.      FOLLOW-UP: Lastly, we discussed with Ms. Boss that cancer genetics is a rapidly advancing field and it is possible that new genetic tests will be appropriate for her and/or her family members in the future. We encouraged her to remain in contact with cancer genetics on an annual basis so we can update her personal and family histories and let her know of advances in cancer genetics that may benefit this family.   Our contact number was provided. Ms. Whittingham questions were answered to her satisfaction, and she knows she is welcome to call us at anytime with additional questions or concerns.   Ferol Luz, MS, [facility] Certified Genetic Counselor [email or web address]

## 2018-05-25 ENCOUNTER — Encounter: Payer: Self-pay | Admitting: Genetics

## 2018-05-31 ENCOUNTER — Other Ambulatory Visit: Payer: Self-pay | Admitting: Internal Medicine

## 2018-06-04 ENCOUNTER — Other Ambulatory Visit: Payer: Self-pay | Admitting: Internal Medicine

## 2018-06-04 DIAGNOSIS — Z1231 Encounter for screening mammogram for malignant neoplasm of breast: Secondary | ICD-10-CM

## 2018-06-08 ENCOUNTER — Inpatient Hospital Stay: Payer: BLUE CROSS/BLUE SHIELD | Attending: Genetic Counselor | Admitting: Hematology

## 2018-06-08 ENCOUNTER — Encounter: Payer: Self-pay | Admitting: Hematology

## 2018-06-08 VITALS — BP 123/85 | HR 92 | Temp 99.0°F | Resp 18 | Ht 63.5 in | Wt 208.8 lb

## 2018-06-08 DIAGNOSIS — Z800 Family history of malignant neoplasm of digestive organs: Secondary | ICD-10-CM

## 2018-06-08 DIAGNOSIS — K219 Gastro-esophageal reflux disease without esophagitis: Secondary | ICD-10-CM | POA: Insufficient documentation

## 2018-06-08 DIAGNOSIS — Z7984 Long term (current) use of oral hypoglycemic drugs: Secondary | ICD-10-CM | POA: Diagnosis not present

## 2018-06-08 DIAGNOSIS — Z79899 Other long term (current) drug therapy: Secondary | ICD-10-CM | POA: Diagnosis not present

## 2018-06-08 DIAGNOSIS — I10 Essential (primary) hypertension: Secondary | ICD-10-CM | POA: Diagnosis not present

## 2018-06-08 DIAGNOSIS — E785 Hyperlipidemia, unspecified: Secondary | ICD-10-CM | POA: Insufficient documentation

## 2018-06-08 DIAGNOSIS — Z808 Family history of malignant neoplasm of other organs or systems: Secondary | ICD-10-CM | POA: Diagnosis not present

## 2018-06-08 DIAGNOSIS — Z85828 Personal history of other malignant neoplasm of skin: Secondary | ICD-10-CM | POA: Diagnosis not present

## 2018-06-08 DIAGNOSIS — Z1509 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: Secondary | ICD-10-CM

## 2018-06-08 DIAGNOSIS — Z803 Family history of malignant neoplasm of breast: Secondary | ICD-10-CM | POA: Insufficient documentation

## 2018-06-08 DIAGNOSIS — Z1501 Genetic susceptibility to malignant neoplasm of breast: Secondary | ICD-10-CM | POA: Diagnosis not present

## 2018-06-08 NOTE — Progress Notes (Signed)
[place]  [phone] [phone]  Clinic New Consult Note   Patient Care Team: Panosh, Standley Brooking, MD as PCP - General Gwendolyn Fill, MD as Attending Physician (Obstetrics and Gynecology) Tiajuana Amass, MD (Allergy) Haverstock, Jennefer Bravo, MD (Dermatology)   Date of Service:  06/08/2018   Referral physician: Dennis Bast  CHIEF COMPLAINTS/PURPOSE OF CONSULTATION:  CDKN2A gene mutation positive  HISTORY OF PRESENTING ILLNESS:  Nicole Gonzalez 47 y.o. 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 The patient was referred by [employer]. The patient presents to the clinic today by herself.  She notes her brother had breast cancer in his 85s and he was genetically tested and was found to have ATM mutation with unspecified variance same as her. Her maternal great grandmother had breast cancer, her maternal grandmother stopped getting breast screenings. Her paternal grandmother had pancreatic cancer in her 68s and passed away. Her father had 3 melanomas and recently diagnosed with stage II pancreatic cancer at 48yo. He has had whipple surgery and completed chemo. Her paternal aunt and younger brother plans to get genetic testing as well. Her father was genetically tested and he has ATM and CDK mutation.  She had a breast biopsy once only which was found to be an infection. 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  In the past, she had partial hysterectomy at 39 from fibroid tumors. This also resolved her anemia. She had a seizure as an infant. She    GYN HISTORY  Menarchal: 11 LMP: 39 Contraceptive: s/p hysterectomy  HRT: No  P2: first gave both at 47yo, breasted with both children     MEDICAL HISTORY:  Past Medical History:  Diagnosis Date  . Anemia     better after hysterectomy   . Cancer (HCC)    basil cell carcinoma/forehead  . Diabetes mellitus without complication ([place])   . DIABETES MELLITUS, GESTATIONAL, HX OF 06/02/2009   Qualifier: Diagnosis of  By: Regis Bill MD, Standley Brooking   . Family history of breast cancer   . Family history of genetic disease carrier    father has CDKN2A and ATM mutation  . Family history of melanoma   . Family history of pancreatic cancer   . Fracture of lower leg     fall 2011  . GERD (gastroesophageal reflux disease)   . History of basal cell carcinoma excision   . History of gestational diabetes    x2  . HTN (hypertension)   . Hyperlipidemia    [id number] 043 trx   . Increased frequency of headaches 03/15/2012  . Obesity   . Seizures ([place])    as an infant d/t fever  . UNSPECIFIED TACHYCARDIA 09/01/2010   Qualifier: Diagnosis of  By: Charlett Blake MD, Erline Levine      SURGICAL HISTORY: Past Surgical History:  Procedure Laterality Date  . ABDOMINAL HYSTERECTOMY  06/29/2010    question had abnormal cells on Path, fibroids  . BASAL CELL CARCINOMA EXCISION    . BREAST BIOPSY Right 2009  . TONSILLECTOMY      SOCIAL HISTORY: Social History   Socioeconomic History  . Marital status: Married    Spouse name: Not on file  . Number of children: 2  . Years of education: Not on file  . Highest education level: Not on file  Occupational History  .  Occupation: Barista: [employer]  . Financial resource strain: Not on file  . Food insecurity:    Worry: Not on file    Inability: Not on file  . Transportation needs:    Medical: Not on file    Non-medical: Not on file  Tobacco Use  . Smoking status: Never Smoker  . Smokeless tobacco: Never Used  Substance and Sexual Activity  . Alcohol use: No  . Drug use: No  . Sexual activity: Not on file  Lifestyle  . Physical activity:    Days per week: Not on file    Minutes per session: Not on file  . Stress: Not on file  Relationships  . Social  connections:    Talks on phone: Not on file    Gets together: Not on file    Attends religious service: Not on file    Active member of club or organization: Not on file    Attends meetings of clubs or organizations: Not on file    Relationship status: Not on file  . Intimate partner violence:    Fear of current or ex partner: Not on file    Emotionally abused: Not on file    Physically abused: Not on file    Forced sexual activity: Not on file  Other Topics Concern  . 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    Nonsmoker married    FAMILY HISTORY: Family History  Problem Relation Age of Onset  . Melanoma Father        x 3 times, first in 74's  . Hypertension Father   . Hyperlipidemia Father   . Squamous cell carcinoma Father   . Colon polyps Father   . Heart disease Father   . Pancreatic cancer Father 52       CDKN2A muation  . Diabetes Maternal Grandmother   . Hyperlipidemia Maternal Grandmother   . Heart disease Maternal Grandmother   . Arthritis Brother         Psoriatic  . Pancreatic cancer Paternal Grandmother 80       met liver  . Heart disease Paternal Grandfather   . Breast cancer Brother 52       ATM + mutation? or VUS  . Skin cancer Maternal Grandfather   . Breast cancer Other   . Skin cancer Cousin     ALLERGIES:  is allergic to contrast media [iodinated diagnostic agents]; adhesive [tape]; other; and stadol [butorphanol].  MEDICATIONS:  Current Outpatient Medications  Medication Sig Dispense Refill  . fexofenadine (ALLEGRA) 180 MG tablet Take 180 mg by mouth daily. In the morning    . levocetirizine (XYZAL) 5 MG tablet Take 5 mg by mouth every evening.     Marland Kitchen lisinopril (PRINIVIL,ZESTRIL) 10 MG tablet TAKE 1 TABLET BY MOUTH DAILY FOR HIGH BLOOD PRESSURE 90 tablet 0  . metFORMIN (GLUCOPHAGE-XR) 500 MG 24 hr tablet TAKE 3 TABLET  BY MOUTH DAILY WITH BREAKFAST. GENERIC EQUIVALENT FOR GLUCOPHAGE XR 270 tablet 1  . montelukast (SINGULAIR) 10 MG tablet Take 10 mg by mouth daily.     Marland Kitchen omeprazole (PRILOSEC) 20 MG capsule Take 20 mg by mouth daily.     Norvel Richards  80 MCG/ACT AERS Place 2 sprays into the nose daily.     . simvastatin (ZOCOR) 40 MG tablet TAKE 1 TABLET BY MOUTH DAILY AT BEDTIME 90 tablet 0  . BAYER CONTOUR NEXT TEST test strip Use as instructed 100 each 11  . glucose blood (BAYER CONTOUR TEST) test strip Test 1-2 times daily Dx E11.9 100 each 12  . Lancets MISC Use Bayer Contour Next One lancets to test blood sugar 1-2 times daily 100 each 12   No current facility-administered medications for this visit.     REVIEW OF SYSTEMS:   Constitutional: Denies fevers, chills or abnormal night sweats Eyes: Denies blurriness of vision, double vision or watery eyes Ears, nose, mouth, throat, and face: Denies mucositis or sore throat Respiratory: Denies cough, dyspnea or wheezes Cardiovascular: Denies palpitation, chest discomfort or lower extremity swelling Gastrointestinal:  Denies nausea, heartburn or change in bowel habits Skin: Denies abnormal skin rashes Lymphatics: Denies new lymphadenopathy or easy bruising Neurological:Denies numbness, tingling or new weaknesses Behavioral/Psych: Mood is stable, no new changes  All other systems were reviewed with the patient and are negative.  PHYSICAL EXAMINATION: ECOG PERFORMANCE STATUS: 0 - Asymptomatic  Vitals:   06/08/18 1424  BP: 123/85  Pulse: 92  Resp: 18  Temp: 99 F (37.2 C)  SpO2: 97%   Filed Weights   06/08/18 1424  Weight: 208 lb 12.8 oz (94.7 kg)    GENERAL:alert, no distress and comfortable SKIN: skin color, texture, turgor are normal, no rashes or significant lesions EYES: normal, conjunctiva are pink and non-injected, sclera clear OROPHARYNX:no exudate, no erythema and lips, buccal mucosa, and tongue normal  NECK: supple, thyroid normal size,  non-tender, without nodularity LYMPH:  no palpable lymphadenopathy in the cervical, axillary or inguinal LUNGS: clear to auscultation and percussion with normal breathing effort HEART: regular rate & rhythm and no murmurs and no lower extremity edema ABDOMEN:abdomen soft, non-tender and normal bowel sounds Musculoskeletal:no cyanosis of digits and no clubbing  PSYCH: alert & oriented x 3 with fluent speech NEURO: no focal motor/sensory deficits  LABORATORY DATA:  I have reviewed the data as listed CBC Latest Ref Rng & Units 04/10/2018 03/29/2017 03/22/2016  WBC 4.0 - 10.5 K/uL 6.4 6.5 8.2  Hemoglobin 12.0 - 15.0 g/dL 14.3 14.1 13.9  Hematocrit 36.0 - 46.0 % 41.1 42.0 41.2  Platelets 150.0 - 400.0 K/uL 215.0 230.0 220.0    CMP Latest Ref Rng & Units 04/10/2018 03/29/2017 03/22/2016  Glucose 70 - 99 mg/dL 162(H) 154(H) 153(H)  BUN 6 - 23 mg/dL _0 Creatinine 0.40 - 1.20 mg/dL 0.46 0.59 0.59  Sodium 135 - 145 mEq/L 138 138 137  Potassium 3.5 - 5.1 mEq/L 4.4 4.3 4.1  Chloride 96 - 112 mEq/L 104 104 102  CO2 19 - 32 mEq/L _1 Calcium 8.4 - 10.5 mg/dL 9.4 9.3 9.7  Total Protein 6.0 - 8.3 g/dL 6.8 6.8 7.0  Total Bilirubin 0.2 - 1.2 mg/dL 0.6 0.6 0.7  Alkaline Phos 39 - 117 U/L 63 63 59  AST 0 - 37 U/L _2 ALT 0 - 35 U/L _3 GENETICS 05/23/18    RADIOGRAPHIC STUDIES: I have personally reviewed the radiological images as listed and agreed with the findings in the report. No results found.  ASSESSMENT & PLAN:  Nicole Gonzalez is a 47 y.o. Caucasian female with a history of basil cell melanoma, anemia, partial hysterectomy,  1. CDKN2A mutation positive, heterozygous  -She had genetic testing on 06/01/18 which showed ATM c.749G>A (p.Arg250Gln) variance of unknown variance and Pathogenic variant of CDKN2A c.47T>C (p.Leu16Pro). -Patient has no history of cancer, except basal cell skin cancer, but she has strong family history of pancreatic cancer, melanoma, and  breast cancer in her brother. -We discussed that this CDKN2A gene mutation increases risk for pancreatic cancer by 17-20%, melanoma by 58-92% and slightly increased average risk for breast cancer.  -I reviewed the NCI Madaline Savage model to calculate her risk for breast cancer, which is similar to average risk (about 18% lifetime). She will continue yearly mammograms. Given her category C breast density, yearly MRI could also be considered due to better sensitivity -Due to the high risk for melanoma, I encourage her to do self-check, and she will continue to follow up with her dermatologist regularly.  -We discussed pancreatic cancer screening. Pancreatic cancer is difficult to screen and we only offer screening for people with high risks.  -Given her high risk for pancreatic cancer from CDKN2A mutation, I strongly recommend screening. We discussed various tools, including yearly MRI with MRCP or EUS with Dr. Hilarie Fredrickson. CT scan is not a preferred tool for pancreatic cancer screening due to its low sensitivity. -Given the aggressive, fast growing nature of pancreatic cancer, in the screening interim I recommend watching for jaundice, LUQ pain, or sudden worsening of her diabetes. She can also consider monitor cancer antigen Ca19.9 with LFTs every 6-12 months, although the sensitivity of lab screening is low.  -I will also check if the pancreatic cancer screening study at [facility] is still open, she is interested in participating.  -Healthy lifestyle can help her reduce her risk of cancer. I encouraged her to not smoke, lessen red meat intake, regular exercise, watching her weight and control her DM and HTN and to try to keep a positive mindset. -I recommend Vitamin D daily to help her immune system.  -I encouraged her to have the rest of her family genetically tested, she understands.  -F/o open.  She is scheduled to see Dr. Hilarie Fredrickson in early next year, and will decides to do screening EUS vs MRI. She will f/u with Dr.  Hilarie Fredrickson or me for screening.  She will call me if needed.   2. H/o of basil cell melanoma of the forehead s/p resection -Followed by dermatologist    PLAN:  F/u open. She will call me if she wants to follow up with me with annual screening MRI/MRCP for pancreatic cancer.   All questions were answered. The patient knows to call the clinic with any problems, questions or concerns. I spent 30 minutes counseling the patient face to face. The total time spent in the appointment was 40 minutes and more than 50% was on counseling.     Truitt Merle, MD 06/08/2018 3:01 PM  I, Joslyn Devon, am acting as scribe for Truitt Merle, MD.   I have reviewed the above documentation for accuracy and completeness, and I agree with the above.

## 2018-06-09 ENCOUNTER — Encounter: Payer: Self-pay | Admitting: Hematology

## 2018-06-11 ENCOUNTER — Telehealth: Payer: Self-pay | Admitting: Hematology

## 2018-06-11 NOTE — Telephone Encounter (Signed)
No LOS 6/28 °

## 2018-06-20 ENCOUNTER — Ambulatory Visit
Admission: RE | Admit: 2018-06-20 | Discharge: 2018-06-20 | Disposition: A | Discharge status: Home / Self Care | Payer: BLUE CROSS/BLUE SHIELD | Source: Ambulatory Visit | Attending: Internal Medicine | Admitting: Internal Medicine

## 2018-06-20 DIAGNOSIS — Z1231 Encounter for screening mammogram for malignant neoplasm of breast: Secondary | ICD-10-CM

## 2018-06-25 ENCOUNTER — Encounter: Payer: Self-pay | Admitting: Hematology

## 2018-06-28 ENCOUNTER — Encounter: Payer: Self-pay | Admitting: Family Medicine

## 2018-06-28 ENCOUNTER — Ambulatory Visit: Payer: BLUE CROSS/BLUE SHIELD | Admitting: Family Medicine

## 2018-06-28 VITALS — BP 112/68 | HR 88 | Temp 98.3°F | Ht 63.5 in | Wt 208.0 lb

## 2018-06-28 DIAGNOSIS — R300 Dysuria: Secondary | ICD-10-CM

## 2018-06-28 LAB — POCT URINALYSIS DIPSTICK
BILIRUBIN UA: NEGATIVE
Blood, UA: NEGATIVE
Glucose, UA: NEGATIVE
Ketones, UA: NEGATIVE
Leukocytes, UA: NEGATIVE
NITRITE UA: NEGATIVE
PH UA: 5 (ref 5.0–8.0)
PROTEIN UA: NEGATIVE
Spec Grav, UA: 1.025 (ref 1.010–1.025)
UROBILINOGEN UA: 0.2 U/dL

## 2018-06-28 NOTE — Progress Notes (Signed)
HPI:  Using dictation device. Unfortunately this device frequently misinterprets words/phrases.  Acute visit for Dysuria: -started 4 days ago -mild burning at end of stream, mild low back discomfort at times -no fevers, malaise, vomiting, abd/pelvic pain, hematuria, vaginal symptoms or discarge -no periods, s/p hysterectomy -no hx uti or hx of abx allergy per pt -concerned for uti -has diabetes and last hgba1c recently above goal  ROS: See pertinent positives and negatives per HPI.  Past Medical History:  Diagnosis Date  . Anemia     better after hysterectomy  . Cancer (HCC)    basil cell carcinoma/forehead  . Diabetes mellitus without complication ([place])   . DIABETES MELLITUS, GESTATIONAL, HX OF 06/02/2009   Qualifier: Diagnosis of  By: Regis Bill MD, Standley Brooking   . Family history of breast cancer   . Family history of genetic disease carrier    father has CDKN2A and ATM mutation  . Family history of melanoma   . Family history of pancreatic cancer   . Fracture of lower leg     fall 2011  . GERD (gastroesophageal reflux disease)   . History of basal cell carcinoma excision   . History of gestational diabetes    x2  . HTN (hypertension)   . Hyperlipidemia    ldl199 043 trx   . Increased frequency of headaches 03/15/2012  . Obesity   . Seizures ([place])    as an infant d/t fever  . UNSPECIFIED TACHYCARDIA 09/01/2010   Qualifier: Diagnosis of  By: Charlett Blake MD, Erline Levine      Past Surgical History:  Procedure Laterality Date  . ABDOMINAL HYSTERECTOMY  06/29/2010    question had abnormal cells on Path, fibroids  . BASAL CELL CARCINOMA EXCISION    . BREAST BIOPSY Right 2009  . TONSILLECTOMY      Family History  Problem Relation Age of Onset  . Melanoma Father        x 3 times, first in 59's  . Hypertension Father   . Hyperlipidemia Father   . Squamous cell carcinoma Father   . Colon polyps Father   . Heart disease Father   . Pancreatic cancer Father 58       CDKN2A muation   . Diabetes Maternal Grandmother   . Hyperlipidemia Maternal Grandmother   . Heart disease Maternal Grandmother   . Arthritis Brother         Psoriatic  . Pancreatic cancer Paternal Grandmother 32       met liver  . Heart disease Paternal Grandfather   . Breast cancer Brother 46       ATM + mutation? or VUS  . Skin cancer Maternal Grandfather   . Breast cancer Other   . Skin cancer Cousin     SOCIAL HX: see hpi   Current Outpatient Medications:  .  BAYER CONTOUR NEXT TEST test strip, Use as instructed, Disp: 100 each, Rfl: 11 .  fexofenadine (ALLEGRA) 180 MG tablet, Take 180 mg by mouth daily. In the morning, Disp: , Rfl:  .  glucose blood (BAYER CONTOUR TEST) test strip, Test 1-2 times daily Dx E11.9, Disp: 100 each, Rfl: 12 .  Lancets MISC, Use Bayer Contour Next One lancets to test blood sugar 1-2 times daily, Disp: 100 each, Rfl: 12 .  levocetirizine (XYZAL) 5 MG tablet, Take 5 mg by mouth every evening. , Disp: , Rfl:  .  lisinopril (PRINIVIL,ZESTRIL) 10 MG tablet, TAKE 1 TABLET BY MOUTH DAILY FOR HIGH BLOOD PRESSURE, Disp:  90 tablet, Rfl: 0 .  metFORMIN (GLUCOPHAGE-XR) 500 MG 24 hr tablet, TAKE 3 TABLET BY MOUTH DAILY WITH BREAKFAST. GENERIC EQUIVALENT FOR GLUCOPHAGE XR, Disp: 270 tablet, Rfl: 1 .  montelukast (SINGULAIR) 10 MG tablet, Take 10 mg by mouth daily. , Disp: , Rfl:  .  omeprazole (PRILOSEC) 20 MG capsule, Take 20 mg by mouth daily. , Disp: , Rfl:  .  QNASL 80 MCG/ACT AERS, Place 2 sprays into the nose daily. , Disp: , Rfl:  .  simvastatin (ZOCOR) 40 MG tablet, TAKE 1 TABLET BY MOUTH DAILY AT BEDTIME, Disp: 90 tablet, Rfl: 0  EXAM:  Vitals:   06/28/18 1042  BP: 112/68  Pulse: 88  Temp: 98.3 F (36.8 C)    Body mass index is 36.27 kg/m.  GENERAL: vitals reviewed and listed above, alert, oriented, appears well hydrated and in no acute distress  HEENT: atraumatic, conjunttiva clear, no obvious abnormalities on inspection of external nose and ears  NECK:  no obvious masses on inspection  LUNGS: clear to auscultation bilaterally, no wheezes, rales or rhonchi, good air movement  CV: HRRR, no peripheral edema  MS: moves all extremities without noticeable abnormality  ABD: soft, NTTP, no CVA TTP  PSYCH: pleasant and cooperative, no obvious depression or anxiety  ASSESSMENT AND PLAN:  Discussed the following assessment and plan:  Dysuria - Plan: POC Urinalysis Dipstick, Urine Culture  -we discussed possible serious and likely etiologies, workup and treatment, treatment risks and return precautions -after this discussion, Lyncoln opted for urine studies - udip POC ok, culture pending -advise plenty of water, prn azo per instructions, monitor and improve BS levels pending culture results, follow up with PCP if persistent symptoms -of course, we advised Dina  to return or notify a doctor promptly if symptoms worsen or new concerns arise.  Declined AVS. There are no Patient Instructions on file for this visit.  Lucretia Kern, DO

## 2018-06-30 LAB — URINE CULTURE
MICRO NUMBER: 90854210
SPECIMEN QUALITY: ADEQUATE

## 2018-07-02 ENCOUNTER — Telehealth: Payer: Self-pay

## 2018-07-02 MED ORDER — AMOXICILLIN-POT CLAVULANATE 875-125 MG PO TABS: 1.0000 | ORAL_TABLET | Freq: Two times a day (BID) | ORAL | 0 refills | Status: DC

## 2018-07-02 NOTE — Telephone Encounter (Signed)
Patient calls inquiring about a medical trial at [facility].  This was mentioned during her last visit in June with Dr. Burr Medico.   [phone]

## 2018-07-02 NOTE — Addendum Note (Signed)
Addended by: Agnes Lawrence on: 07/02/2018 07:39 AM   Modules accepted: Orders

## 2018-07-02 NOTE — Telephone Encounter (Signed)
I called her back and informed her that the pancreatic cancer screening trial at [facility] is closed now. She will call me in Jan 2020 if she decides to f/u with [place] for pancreatic screening after meeting with Dr. Hilarie Fredrickson.   Truitt Merle MD

## 2018-08-16 ENCOUNTER — Telehealth: Payer: Self-pay | Admitting: Emergency Medicine

## 2018-08-16 NOTE — Telephone Encounter (Signed)
Copied from [place] [id number]. Topic: Appointment Scheduling - Scheduling Inquiry for Clinic >> Aug 16, 2018  3:47 PM Synthia Innocent wrote: Reason for CRM: [place] patient is requesting to have Flu shot done at [place] due to location, closer to home. Requesting to have done on 09/19/18

## 2018-08-17 NOTE — Telephone Encounter (Signed)
LMOVM advising pt that she had been scheduled on day requested.

## 2018-08-17 NOTE — Telephone Encounter (Signed)
Please schedule flu shot for here on 09/19/2018

## 2018-08-19 ENCOUNTER — Other Ambulatory Visit: Payer: Self-pay | Admitting: Internal Medicine

## 2018-08-24 ENCOUNTER — Other Ambulatory Visit: Payer: Self-pay | Admitting: Internal Medicine

## 2018-08-27 ENCOUNTER — Other Ambulatory Visit: Payer: Self-pay | Admitting: Internal Medicine

## 2018-08-29 ENCOUNTER — Other Ambulatory Visit: Payer: Self-pay | Admitting: Internal Medicine

## 2018-08-30 ENCOUNTER — Other Ambulatory Visit: Payer: Self-pay

## 2018-08-30 ENCOUNTER — Telehealth: Payer: Self-pay | Admitting: Internal Medicine

## 2018-08-30 MED ORDER — METFORMIN HCL ER 500 MG PO TB24: ORAL_TABLET | ORAL | 1 refills | Status: DC

## 2018-08-30 NOTE — Telephone Encounter (Signed)
Copied from [place] [id number]. Topic: General - Other >> Aug 30, 2018 11:14 AM Carolyn Stare wrote:  Pt called below med was refused would like a call back    metFORMIN (GLUCOPHAGE-XR) 500 MG 24 hr tablet

## 2018-08-30 NOTE — Telephone Encounter (Signed)
Rx sent to pharmacy requested by patient 

## 2018-08-30 NOTE — Telephone Encounter (Signed)
This has been handled. Pt has not been checking the correct pharmacy. 87mo supply called into pharmacy in may -- should have refills up until November. 2019. Marland KitchenNothing further needed.

## 2018-09-17 ENCOUNTER — Telehealth: Payer: Self-pay

## 2018-09-17 NOTE — Telephone Encounter (Signed)
Patient has decided to go with endoscopy first of the year with Dr. Hilarie Fredrickson.  She said Dr. Burr Medico mentioned her seeing someone else as well.  She doesn't remember the name.

## 2018-09-17 NOTE — Telephone Encounter (Signed)
I am glad that she will see Dr. Hilarie Fredrickson for pancreatic cancer screening. Let her know that she also needs to see a dermatologist (I think she has one) for routine screening for skin cancer especially melanoma.   Truitt Merle MD

## 2018-09-17 NOTE — Telephone Encounter (Signed)
Patient sees Dr. Renda Rolls at [facility] every year for cancer screening.

## 2018-09-19 ENCOUNTER — Ambulatory Visit: Payer: BLUE CROSS/BLUE SHIELD

## 2018-09-19 ENCOUNTER — Ambulatory Visit (INDEPENDENT_AMBULATORY_CARE_PROVIDER_SITE_OTHER): Payer: BLUE CROSS/BLUE SHIELD

## 2018-09-19 DIAGNOSIS — J3089 Other allergic rhinitis: Secondary | ICD-10-CM | POA: Diagnosis not present

## 2018-09-19 DIAGNOSIS — Z23 Encounter for immunization: Secondary | ICD-10-CM

## 2018-09-26 ENCOUNTER — Ambulatory Visit: Payer: BLUE CROSS/BLUE SHIELD

## 2018-10-24 ENCOUNTER — Encounter: Payer: Self-pay | Admitting: *Deleted

## 2018-10-30 ENCOUNTER — Ambulatory Visit: Payer: BLUE CROSS/BLUE SHIELD | Admitting: Internal Medicine

## 2018-10-30 ENCOUNTER — Encounter: Payer: Self-pay | Admitting: Internal Medicine

## 2018-10-30 VITALS — BP 98/64 | HR 90 | Ht 64.0 in | Wt 207.0 lb

## 2018-10-30 DIAGNOSIS — Z1509 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: Secondary | ICD-10-CM

## 2018-10-30 DIAGNOSIS — Z800 Family history of malignant neoplasm of digestive organs: Secondary | ICD-10-CM | POA: Diagnosis not present

## 2018-10-30 DIAGNOSIS — Z01419 Encounter for gynecological examination (general) (routine) without abnormal findings: Secondary | ICD-10-CM | POA: Diagnosis not present

## 2018-10-30 DIAGNOSIS — Z1501 Genetic susceptibility to malignant neoplasm of breast: Secondary | ICD-10-CM

## 2018-10-30 DIAGNOSIS — Z86010 Personal history of colonic polyps: Secondary | ICD-10-CM | POA: Diagnosis not present

## 2018-10-30 DIAGNOSIS — Z1151 Encounter for screening for human papillomavirus (HPV): Secondary | ICD-10-CM | POA: Diagnosis not present

## 2018-10-30 DIAGNOSIS — Z124 Encounter for screening for malignant neoplasm of cervix: Secondary | ICD-10-CM | POA: Diagnosis not present

## 2018-10-30 DIAGNOSIS — Z113 Encounter for screening for infections with a predominantly sexual mode of transmission: Secondary | ICD-10-CM | POA: Diagnosis not present

## 2018-10-30 DIAGNOSIS — Z9189 Other specified personal risk factors, not elsewhere classified: Secondary | ICD-10-CM | POA: Diagnosis not present

## 2018-10-30 DIAGNOSIS — Z6836 Body mass index (BMI) 36.0-36.9, adult: Secondary | ICD-10-CM | POA: Diagnosis not present

## 2018-10-30 DIAGNOSIS — N951 Menopausal and female climacteric states: Secondary | ICD-10-CM | POA: Diagnosis not present

## 2018-10-30 NOTE — Progress Notes (Signed)
Patient ID: Nicole Gonzalez, female   DOB: [birth year], 47 y.o.   MRN: [id number] HPI: Nicole Gonzalez is a 47 year old female with a past medical history of adenomatous colon polyps, colonic diverticulosis, probable IBS, now known genetic predisposition for pancreatic cancer and melanoma with positive CDKN2A gene, family history of pancreas cancer and melanoma in her father, hypertension, hyperlipidemia, history of basal cell carcinoma who is seen in consultation at the request of Dr. Regis Gonzalez to evaluate her genetic abnormality for pancreas cancer screening and colon polyp surveillance.  She is here alone today.  I saw her in May 2014 to evaluate right lower quadrant abdominal pain.  This led to a colonoscopy performed on 05/03/2013.  This showed a normal terminal ileum.  3 sessile polyps measuring 3 to 5 mm in size removed from the sigmoid colon and rectum.  There was mild diverticulosis in the left colon.  There were small internal hemorrhoids.  The pathology for her polyps was tubular adenoma x2 and hyperplastic polyp x1.  5-year surveillance interval was recommended.  She is referred back because her father approximately 1 year ago was diagnosed with pancreatic cancer.  He had a CT scan performed for abdominal hernia surgery which revealed an occult pancreatic cancer.  He had a Whipple procedure in [place], [place] and then had genetic testing leading to the identification of [id number] gene pathogenic variant.  Nicole Gonzalez reports she is feeling well.  She believes that she has irritable bowel and that she can have varying urgent loose stools with constipation.  This is very much dependent on what she eats.  Some mild abdominal cramping before loose stools but otherwise no abdominal pain.  No weight loss.  No issues with heartburn, dysphagia or odynophagia.  No nausea or vomiting.  No epigastric abdominal pain.  No jaundice, itching.  She does take omeprazole 20 mg daily.  She is also taking lisinopril,  metformin, Singulair, simvastatin.  Past Medical History:  Diagnosis Date  . Anemia     better after hysterectomy  . Cancer (HCC)    basil cell carcinoma/forehead  . Diabetes mellitus without complication ([place])   . DIABETES MELLITUS, GESTATIONAL, HX OF 06/02/2009   Qualifier: Diagnosis of  By: Nicole Bill MD, Nicole Gonzalez   . Diverticulosis   . Family history of breast cancer   . Family history of genetic disease carrier    father has CDKN2A and ATM mutation  . Family history of melanoma   . Family history of pancreatic cancer   . Fracture of lower leg     fall 2011  . GERD (gastroesophageal reflux disease)   . History of basal cell carcinoma excision   . History of gestational diabetes    x2  . HTN (hypertension)   . Hyperlipidemia    [id number] trx   . Increased frequency of headaches 03/15/2012  . Internal hemorrhoids   . Obesity   . Seizures ([place])    as an infant d/t fever  . Tubular adenoma of colon   . UNSPECIFIED TACHYCARDIA 09/01/2010   Qualifier: Diagnosis of  By: Nicole Blake MD, Erline Levine      Past Surgical History:  Procedure Laterality Date  . ABDOMINAL HYSTERECTOMY  06/29/2010    question had abnormal cells on Path, fibroids  . BASAL CELL CARCINOMA EXCISION    . BREAST BIOPSY Right 2009  . TONSILLECTOMY      Outpatient Medications Prior to Visit  Medication Sig Dispense Refill  . BAYER CONTOUR NEXT TEST test strip  Use as instructed 100 each 11  . fexofenadine (ALLEGRA) 180 MG tablet Take 180 mg by mouth daily. In the morning    . glucose blood (BAYER CONTOUR TEST) test strip Test 1-2 times daily Dx E11.9 100 each 12  . Lancets MISC Use Bayer Contour Next One lancets to test blood sugar 1-2 times daily 100 each 12  . levocetirizine (XYZAL) 5 MG tablet Take 5 mg by mouth every evening.     Marland Kitchen lisinopril (PRINIVIL,ZESTRIL) 10 MG tablet TAKE 1 TABLET BY MOUTH DAILY FOR HIGH BLOOD PRESSURE 90 tablet 1  . metFORMIN (GLUCOPHAGE-XR) 500 MG 24 hr tablet TAKE 3 TABLET BY MOUTH DAILY  WITH BREAKFAST. GENERIC EQUIVALENT FOR GLUCOPHAGE XR 270 tablet 1  . montelukast (SINGULAIR) 10 MG tablet Take 10 mg by mouth daily.     Marland Kitchen omeprazole (PRILOSEC) 20 MG capsule Take 20 mg by mouth daily.     Norvel Richards 80 MCG/ACT AERS Place 2 sprays into the nose daily.     . simvastatin (ZOCOR) 40 MG tablet TAKE 1 TABLET BY MOUTH DAILY AT BEDTIME 90 tablet 0  . simvastatin (ZOCOR) 40 MG tablet TAKE 1 TABLET BY MOUTH DAILY AT BEDTIME 90 tablet 1  . amoxicillin-clavulanate (AUGMENTIN) 875-125 MG tablet Take 1 tablet by mouth 2 (two) times daily. (Patient not taking: Reported on 10/30/2018) 10 tablet 0  . lisinopril (PRINIVIL,ZESTRIL) 10 MG tablet TAKE 1 TABLET BY MOUTH DAILY FOR HIGH BLOOD PRESSURE (Patient not taking: Reported on 10/30/2018) 90 tablet 0   No facility-administered medications prior to visit.     Allergies  Allergen Reactions  . Contrast Media [Iodinated Diagnostic Agents] Other (See Comments)    ORAL and IV Fever and itchy mouth ORAL and IV Fever and itchy mouth  . Adhesive [Tape]   . Other     Adhesive, Metals  . Stadol [Butorphanol]     Family History  Problem Relation Age of Onset  . Melanoma Father        x 3 times, first in 18's  . Hypertension Father   . Hyperlipidemia Father   . Squamous cell carcinoma Father   . Colon polyps Father   . Heart disease Father   . Pancreatic cancer Father 73       CDKN2A muation  . Diabetes Maternal Grandmother   . Hyperlipidemia Maternal Grandmother   . Heart disease Maternal Grandmother   . Arthritis Brother         Psoriatic  . Pancreatic cancer Paternal Grandmother 54       met liver  . Heart disease Paternal Grandfather   . Breast cancer Brother 48       ATM + mutation? or VUS  . Skin cancer Maternal Grandfather   . Breast cancer Other   . Skin cancer Cousin     Social History   Tobacco Use  . Smoking status: Never Smoker  . Smokeless tobacco: Never Used  Substance Use Topics  . Alcohol use: No  . Drug  use: No    ROS: As per history of present illness, otherwise negative  BP 98/64   Pulse 90   Ht '5\' 4"'  (1.626 m)   Wt 207 lb (93.9 kg)   BMI 35.53 kg/m  Constitutional: Well-developed and well-nourished. No distress. HEENT: Normocephalic and atraumatic.  Conjunctivae are normal.  No scleral icterus. Neck: Neck supple. Trachea midline. Cardiovascular: Normal rate, regular rhythm and intact distal pulses. No M/R/G Pulmonary/chest: Effort normal and breath sounds normal. No  wheezing, rales or rhonchi. Abdominal: Soft, nontender, nondistended. Bowel sounds active throughout. There are no masses palpable. No hepatosplenomegaly. Extremities: no clubbing, cyanosis, or edema Neurological: Alert and oriented to person place and time. Skin: Skin is warm and dry.  Psychiatric: Normal mood and affect. Behavior is normal.  RELEVANT LABS AND IMAGING: CBC    Component Value Date/Time   WBC 6.4 04/10/2018 1018   RBC 4.81 04/10/2018 1018   HGB 14.3 04/10/2018 1018   HCT 41.1 04/10/2018 1018   PLT 215.0 04/10/2018 1018   MCV 85.5 04/10/2018 1018   MCH 24.0 (L) 07/04/2010 2035   MCHC 34.7 04/10/2018 1018   RDW 12.5 04/10/2018 1018   LYMPHSABS 1.6 04/10/2018 1018   MONOABS 0.4 04/10/2018 1018   EOSABS 0.1 04/10/2018 1018   BASOSABS 0.0 04/10/2018 1018    CMP     Component Value Date/Time   NA 138 04/10/2018 1018   K 4.4 04/10/2018 1018   CL 104 04/10/2018 1018   CO2 24 04/10/2018 1018   GLUCOSE 162 (H) 04/10/2018 1018   GLUCOSE 89 09/29/2006 0947   BUN 13 04/10/2018 1018   CREATININE 0.46 04/10/2018 1018   CALCIUM 9.4 04/10/2018 1018   PROT 6.8 04/10/2018 1018   ALBUMIN 4.4 04/10/2018 1018   AST 19 04/10/2018 1018   ALT 24 04/10/2018 1018   ALKPHOS 63 04/10/2018 1018   BILITOT 0.6 04/10/2018 1018   GFRNONAA >60 06/18/2010 1145   GFRAA  06/18/2010 1145    >60        The eGFR has been calculated using the MDRD equation. This calculation has not been validated in all  clinical situations. eGFR's persistently <60 mL/min signify possible Chronic Kidney Disease.    ASSESSMENT/PLAN:  48 year old female with a past medical history of adenomatous colon polyps, colonic diverticulosis, probable IBS, now known genetic predisposition for pancreatic cancer and melanoma with positive CDKN2A gene, family history of pancreas cancer and melanoma in her father, hypertension, hyperlipidemia, history of basal cell carcinoma who is seen in consultation at the request of Dr. Regis Gonzalez to evaluate her genetic abnormality for pancreas cancer screening and colon polyp surveillance.  1. CDKN2A pathogenic gene mutation/family history of pancreatic cancer in her father --this gene is associated with an autosomal dominant melanoma-pancreatic cancer syndrome.  According to data she has a lifetime risk of pancreatic cancer equal to 17%, higher in smokers.  Fortunately she does not use tobacco.  With this information I have recommended that we pursue pancreatic cancer screening with a EUS.  We discussed the modalities including EUS versus MRI of the abdomen with and without contrast plus MRCP.  EUS does have a higher sensitivity and so we will start with this.  We discussed how we could consider EUS on an annual basis or EUS alternating with MRI every other year.  I will asked Dr. Ardis Hughs, my partner, to review this information and ensure he agrees with plan for EUS.  I appreciate his help.  2.  History of adenomatous colon polyps --she is due for surveillance colonoscopy this year.  She prefers to have this done on the same day as her EUS, if possible.  This does seem reasonable.  I will asked Dr. Ardis Hughs if he can perform colonoscopy after EUS in the outpatient hospital setting.  Once he reviews her chart, we can contact the patient to arrange EUS for pancreatic cancer screening and colonoscopy for polyp surveillance  I did discuss the risk, benefits and alternatives to both upper  endoscopy,  EUS and colonoscopy.  She is agreeable and wishes to proceed      FC:[id number], Nicole Gonzalez, [place], [place] [address]

## 2018-10-30 NOTE — Patient Instructions (Signed)
We will contact you regarding endoscopic ultrasound and colonoscopy after Dr Hilarie Fredrickson and Dr Ardis Hughs have spoken and arranged procedure time/date. If you have not heard from [place] within the next 2 weeks, please call us at [phone].  If you are age 48 or older, your body mass index should be between 23-30. Your Body mass index is 35.53 kg/m. If this is out of the aforementioned range listed, please consider follow up with your Primary Care Provider.  If you are age 70 or younger, your body mass index should be between 19-25. Your Body mass index is 35.53 kg/m. If this is out of the aformentioned range listed, please consider follow up with your Primary Care Provider.

## 2018-10-31 ENCOUNTER — Telehealth: Payer: Self-pay

## 2018-10-31 NOTE — Telephone Encounter (Signed)
-----   Message from Milus Banister, MD sent at 10/31/2018 11:51 AM EST ----- Ulice Dash, I am happy to help with this.  Took me a while to find this specific genetic mutation but it more commonly goes by P 16 and she is clearly at elevated risk for pancreatic cancer herself.  Screening is quite an undertaking, probably would recommend alternating MRI and endoscopic ultrasound annually.  Even with annual screening tests I do not believe that pancreatic cancer survival rates among these high risk people are improved.  Very scary.    Go ahead and let her know that we have spoken and that we can start with endoscopic ultrasound at same time as colonoscopy in January .  Adrienne Mocha, She needs upper EUS for pancreatic cancer screening as well as colonoscopy same visit.  Please pick a Thursday at [facility] sometime in January per patient preference.  Thank you.   ----- Message ----- From: Jerene Bears, MD Sent: 10/30/2018   3:25 PM EST To: Milus Banister, MD  Dan, Could you help here with EUS and colon. Nice lady with strong family hx of panc cancer and she has her dad's genetic mutation in CDKN2A gene.  I have her genetics profile if you need to see it. Thanks JMP  She prefers Jan 2020 given the new deductible starting Jan 1

## 2018-11-01 NOTE — Telephone Encounter (Signed)
Recall has been added to Epic for EUS and colon in January.  The pt was advised she would be contacted at that time to set up appt.  She will not need a previsit.  She will call if she has not heard from this office in January. The pt has been advised of the information and verbalized understanding.

## 2018-11-02 NOTE — Progress Notes (Signed)
Ulice Dash, Thanks. I'll have Patty work on EUS/Colonoscopy in January   Patty, She needs upper EUS (screening for pancreatic cancer, she is at high risk) and colonoscopy (polyp surveillance).  Sometime in January per her preference.    thanks

## 2018-12-14 ENCOUNTER — Telehealth: Payer: Self-pay | Admitting: Gastroenterology

## 2018-12-14 DIAGNOSIS — Z86010 Personal history of colonic polyps: Secondary | ICD-10-CM

## 2018-12-14 DIAGNOSIS — Z800 Family history of malignant neoplasm of digestive organs: Secondary | ICD-10-CM

## 2018-12-14 DIAGNOSIS — Z1501 Genetic susceptibility to malignant neoplasm of breast: Secondary | ICD-10-CM

## 2018-12-14 DIAGNOSIS — Z1509 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: Principal | ICD-10-CM

## 2018-12-14 NOTE — Telephone Encounter (Signed)
Pt is needing a call back about the colon/endo that she may have to sched

## 2018-12-17 ENCOUNTER — Other Ambulatory Visit: Payer: Self-pay

## 2018-12-17 DIAGNOSIS — Z1509 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: Principal | ICD-10-CM

## 2018-12-17 DIAGNOSIS — Z86010 Personal history of colonic polyps: Secondary | ICD-10-CM

## 2018-12-17 DIAGNOSIS — Z1501 Genetic susceptibility to malignant neoplasm of breast: Secondary | ICD-10-CM

## 2018-12-17 DIAGNOSIS — Z800 Family history of malignant neoplasm of digestive organs: Secondary | ICD-10-CM

## 2018-12-17 MED ORDER — PEG 3350-KCL-NA BICARB-NACL 420 G PO SOLR: 4000.0000 mL | Freq: Once | ORAL | 0 refills | Status: AC

## 2018-12-17 NOTE — Telephone Encounter (Signed)
EUS colon scheduled for 2/6 at 730 am EUS scheduled, pt instructed and medications reviewed.  Patient instructions mailed to home.  Patient to call with any questions or concerns.

## 2018-12-24 ENCOUNTER — Encounter: Payer: Self-pay | Admitting: Genetics

## 2018-12-24 NOTE — Progress Notes (Signed)
Amended Report: The Variant of Uncertain Significance in ATM c.749G>A (p.Arg250Gln) has been reclassified to Likely Benign.  Her test is still POSITIVE for the pathogenic variant in CDKN2A. The date of this amended report is 12/18/2018.  

## 2018-12-31 DIAGNOSIS — H5213 Myopia, bilateral: Secondary | ICD-10-CM | POA: Diagnosis not present

## 2018-12-31 DIAGNOSIS — H524 Presbyopia: Secondary | ICD-10-CM | POA: Diagnosis not present

## 2018-12-31 DIAGNOSIS — E119 Type 2 diabetes mellitus without complications: Secondary | ICD-10-CM | POA: Diagnosis not present

## 2019-01-11 ENCOUNTER — Telehealth: Payer: Self-pay | Admitting: Gastroenterology

## 2019-01-11 MED ORDER — PEG 3350-KCL-NA BICARB-NACL 420 G PO SOLR: 4000.0000 mL | Freq: Once | ORAL | 0 refills | Status: AC

## 2019-01-11 NOTE — Telephone Encounter (Signed)
Prescription was sent to [facility] the day the procedure was scheduled.  I advised the pt that I have resent as requested and also advised her that it should be picked up in the next 2-3 days to avoid it being cancelled at the pharmacy

## 2019-01-11 NOTE — Telephone Encounter (Signed)
Pt called in needing prep sent in to her pharm.

## 2019-01-16 DIAGNOSIS — Z85828 Personal history of other malignant neoplasm of skin: Secondary | ICD-10-CM | POA: Diagnosis not present

## 2019-01-16 DIAGNOSIS — L820 Inflamed seborrheic keratosis: Secondary | ICD-10-CM | POA: Diagnosis not present

## 2019-01-16 DIAGNOSIS — L821 Other seborrheic keratosis: Secondary | ICD-10-CM | POA: Diagnosis not present

## 2019-01-16 DIAGNOSIS — L814 Other melanin hyperpigmentation: Secondary | ICD-10-CM | POA: Diagnosis not present

## 2019-01-16 NOTE — Anesthesia Preprocedure Evaluation (Addendum)
Anesthesia Evaluation  Patient identified by MRN, date of birth, ID band Patient awake    Reviewed: Allergy & Precautions, NPO status , Patient's Chart, lab work & pertinent test results  History of Anesthesia Complications Negative for: history of anesthetic complications  Airway Mallampati: II  TM Distance: >3 FB Neck ROM: Full    Dental  (+) Teeth Intact, Dental Advisory Given   Pulmonary neg pulmonary ROS,    Pulmonary exam normal        Cardiovascular hypertension, Pt. on medications Normal cardiovascular exam     Neuro/Psych  Headaches,    GI/Hepatic Neg liver ROS, GERD  ,  Endo/Other  diabetesMorbid obesity  Renal/GU negative Renal ROS     Musculoskeletal negative musculoskeletal ROS (+)   Abdominal   Peds  Hematology negative hematology ROS (+)   Anesthesia Other Findings Day of surgery medications reviewed with the patient.  Reproductive/Obstetrics                            Anesthesia Physical Anesthesia Plan  ASA: II  Anesthesia Plan: MAC   Post-op Pain Management:    Induction:   PONV Risk Score and Plan: Ondansetron and Propofol infusion  Airway Management Planned: Natural Airway and Nasal Cannula  Additional Equipment:   Intra-op Plan:   Post-operative Plan:   Informed Consent: I have reviewed the patients History and Physical, chart, labs and discussed the procedure including the risks, benefits and alternatives for the proposed anesthesia with the patient or authorized representative who has indicated his/her understanding and acceptance.     Dental advisory given  Plan Discussed with: CRNA and Anesthesiologist  Anesthesia Plan Comments:       Anesthesia Quick Evaluation

## 2019-01-17 ENCOUNTER — Ambulatory Visit: Payer: BLUE CROSS/BLUE SHIELD | Admitting: Family Medicine

## 2019-01-17 ENCOUNTER — Ambulatory Visit (HOSPITAL_COMMUNITY)
Admission: RE | Admit: 2019-01-17 | Discharge: 2019-01-17 | Disposition: A | Discharge status: Home / Self Care | Payer: BLUE CROSS/BLUE SHIELD | Attending: Gastroenterology | Admitting: Gastroenterology

## 2019-01-17 ENCOUNTER — Encounter (HOSPITAL_COMMUNITY): Payer: Self-pay | Admitting: *Deleted

## 2019-01-17 ENCOUNTER — Telehealth: Payer: Self-pay | Admitting: Gastroenterology

## 2019-01-17 ENCOUNTER — Ambulatory Visit (HOSPITAL_COMMUNITY): Payer: BLUE CROSS/BLUE SHIELD | Admitting: Anesthesiology

## 2019-01-17 ENCOUNTER — Encounter: Payer: Self-pay | Admitting: Family Medicine

## 2019-01-17 ENCOUNTER — Other Ambulatory Visit: Payer: Self-pay

## 2019-01-17 ENCOUNTER — Encounter (HOSPITAL_COMMUNITY)
Admission: RE | Disposition: A | Discharge status: Home / Self Care | Payer: Self-pay | Source: Home / Self Care | Attending: Gastroenterology

## 2019-01-17 VITALS — BP 122/74 | HR 90 | Temp 99.4°F | Ht 64.0 in | Wt 204.3 lb

## 2019-01-17 DIAGNOSIS — Z6835 Body mass index (BMI) 35.0-35.9, adult: Secondary | ICD-10-CM | POA: Diagnosis not present

## 2019-01-17 DIAGNOSIS — Z1509 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: Secondary | ICD-10-CM

## 2019-01-17 DIAGNOSIS — D122 Benign neoplasm of ascending colon: Secondary | ICD-10-CM | POA: Diagnosis not present

## 2019-01-17 DIAGNOSIS — Z7984 Long term (current) use of oral hypoglycemic drugs: Secondary | ICD-10-CM | POA: Insufficient documentation

## 2019-01-17 DIAGNOSIS — E119 Type 2 diabetes mellitus without complications: Secondary | ICD-10-CM | POA: Insufficient documentation

## 2019-01-17 DIAGNOSIS — E669 Obesity, unspecified: Secondary | ICD-10-CM | POA: Diagnosis not present

## 2019-01-17 DIAGNOSIS — K635 Polyp of colon: Secondary | ICD-10-CM

## 2019-01-17 DIAGNOSIS — Z860100 Personal history of colon polyps, unspecified: Secondary | ICD-10-CM

## 2019-01-17 DIAGNOSIS — Z1501 Genetic susceptibility to malignant neoplasm of breast: Secondary | ICD-10-CM | POA: Diagnosis not present

## 2019-01-17 DIAGNOSIS — E785 Hyperlipidemia, unspecified: Secondary | ICD-10-CM | POA: Diagnosis not present

## 2019-01-17 DIAGNOSIS — J111 Influenza due to unidentified influenza virus with other respiratory manifestations: Secondary | ICD-10-CM

## 2019-01-17 DIAGNOSIS — Z1211 Encounter for screening for malignant neoplasm of colon: Secondary | ICD-10-CM | POA: Insufficient documentation

## 2019-01-17 DIAGNOSIS — Z800 Family history of malignant neoplasm of digestive organs: Secondary | ICD-10-CM

## 2019-01-17 DIAGNOSIS — K219 Gastro-esophageal reflux disease without esophagitis: Secondary | ICD-10-CM | POA: Diagnosis not present

## 2019-01-17 DIAGNOSIS — Z79899 Other long term (current) drug therapy: Secondary | ICD-10-CM | POA: Insufficient documentation

## 2019-01-17 DIAGNOSIS — I10 Essential (primary) hypertension: Secondary | ICD-10-CM | POA: Diagnosis not present

## 2019-01-17 DIAGNOSIS — Z85828 Personal history of other malignant neoplasm of skin: Secondary | ICD-10-CM | POA: Insufficient documentation

## 2019-01-17 DIAGNOSIS — R69 Illness, unspecified: Secondary | ICD-10-CM | POA: Diagnosis not present

## 2019-01-17 HISTORY — PX: POLYPECTOMY: SHX5525

## 2019-01-17 HISTORY — PX: ESOPHAGOGASTRODUODENOSCOPY (EGD) WITH PROPOFOL: SHX5813

## 2019-01-17 HISTORY — PX: COLONOSCOPY WITH PROPOFOL: SHX5780

## 2019-01-17 HISTORY — PX: EUS: SHX5427

## 2019-01-17 LAB — POC INFLUENZA A&B (BINAX/QUICKVUE)
Influenza A, POC: NEGATIVE
Influenza B, POC: NEGATIVE

## 2019-01-17 LAB — GLUCOSE, CAPILLARY: Glucose-Capillary: 136 mg/dL — ABNORMAL HIGH (ref 70–99)

## 2019-01-17 LAB — POCT RAPID STREP A (OFFICE): Rapid Strep A Screen: NEGATIVE

## 2019-01-17 SURGERY — UPPER ENDOSCOPIC ULTRASOUND (EUS) RADIAL
Anesthesia: Monitor Anesthesia Care

## 2019-01-17 MED ORDER — PROPOFOL 10 MG/ML IV BOLUS
INTRAVENOUS | Status: AC
  Filled 2019-01-17: qty 20

## 2019-01-17 MED ORDER — PROPOFOL 10 MG/ML IV BOLUS
INTRAVENOUS | Status: AC
  Filled 2019-01-17: qty 60

## 2019-01-17 MED ORDER — SODIUM CHLORIDE 0.9 % IV SOLN: INTRAVENOUS | Status: DC

## 2019-01-17 MED ORDER — OSELTAMIVIR PHOSPHATE 75 MG PO CAPS: 75.0000 mg | ORAL_CAPSULE | Freq: Two times a day (BID) | ORAL | 0 refills | Status: DC

## 2019-01-17 MED ORDER — LACTATED RINGERS IV SOLN
INTRAVENOUS | Status: DC
  Administered 2019-01-17: 08:00:00 via INTRAVENOUS
  Administered 2019-01-17: 1000 mL via INTRAVENOUS

## 2019-01-17 MED ORDER — PROPOFOL 10 MG/ML IV BOLUS
INTRAVENOUS | Status: DC | PRN
  Administered 2019-01-17: 50 mg via INTRAVENOUS
  Administered 2019-01-17: 30 mg via INTRAVENOUS
  Administered 2019-01-17: 40 mg via INTRAVENOUS
  Administered 2019-01-17 (×2): 30 mg via INTRAVENOUS

## 2019-01-17 MED ORDER — PROPOFOL 500 MG/50ML IV EMUL
INTRAVENOUS | Status: DC | PRN
  Administered 2019-01-17: 100 ug/kg/min via INTRAVENOUS

## 2019-01-17 MED ORDER — LIDOCAINE 2% (20 MG/ML) 5 ML SYRINGE
INTRAMUSCULAR | Status: DC | PRN
  Administered 2019-01-17: 100 mg via INTRAVENOUS

## 2019-01-17 SURGICAL SUPPLY — 21 items

## 2019-01-17 NOTE — Anesthesia Postprocedure Evaluation (Signed)
Anesthesia Post Note  Patient: Maedell M Gorgas  Procedure(s) Performed: UPPER ENDOSCOPIC ULTRASOUND (EUS) RADIAL (N/A ) COLONOSCOPY WITH PROPOFOL (N/A ) POLYPECTOMY ESOPHAGOGASTRODUODENOSCOPY (EGD) WITH PROPOFOL (N/A )     Patient location during evaluation: Endoscopy Anesthesia Type: MAC Level of consciousness: awake and alert Pain management: pain level controlled Vital Signs Assessment: post-procedure vital signs reviewed and stable Respiratory status: spontaneous breathing, nonlabored ventilation, respiratory function stable and patient connected to nasal cannula oxygen Cardiovascular status: blood pressure returned to baseline and stable Postop Assessment: no apparent nausea or vomiting Anesthetic complications: no    Last Vitals:  Vitals:   01/17/19 0840 01/17/19 0844  BP: (!) 98/55 (!) 100/57  Pulse: 80 80  Resp: (!) 22 (!) 30  Temp:    SpO2: 97% 94%    Last Pain:  Vitals:   01/17/19 0844  TempSrc:   PainSc: 0-No pain                 Calissa Swenor DANIEL

## 2019-01-17 NOTE — Op Note (Signed)
[facility] Patient Name: Nicole Gonzalez Procedure Date: 01/17/2019 MRN: [id number] Attending MD: Milus Banister , MD Date of Birth: 12-05-1971 CSN: [id number] Age: 48 Admit Type: Outpatient Procedure:                Colonoscopy Indications:              High risk colon cancer surveillance: Personal                            history of colonic polyps; Colonoscopy 2014 Dr.                            Hilarie Fredrickson found two subCM adenomas Providers:                Milus Banister, MD, Burtis Junes, RN, Cherylynn Ridges,                            Technician, Heide Scales, CRNA Referring MD:             Zenovia Jarred, MD Medicines:                Monitored Anesthesia Care Complications:            No immediate complications. Estimated blood loss:                            None. Estimated Blood Loss:     Estimated blood loss: none. Procedure:                Pre-Anesthesia Assessment:                           - Prior to the procedure, a History and Physical                            was performed, and patient medications and                            allergies were reviewed. The patient's tolerance of                            previous anesthesia was also reviewed. The risks                            and benefits of the procedure and the sedation                            options and risks were discussed with the patient.                            All questions were answered, and informed consent                            was obtained. Prior Anticoagulants: The patient has  taken no previous anticoagulant or antiplatelet                            agents. ASA Grade Assessment: II - A patient with                            mild systemic disease. After reviewing the risks                            and benefits, the patient was deemed in                            satisfactory condition to undergo the procedure.                           After obtaining  informed consent, the colonoscope                            was passed under direct vision. Throughout the                            procedure, the patient's blood pressure, pulse, and                            oxygen saturations were monitored continuously. The                            [id number]-[id number] ([id number]) Olympus colonoscope was                            introduced through the anus and advanced to the the                            cecum, identified by appendiceal orifice and                            ileocecal valve. The colonoscopy was performed                            without difficulty. The patient tolerated the                            procedure well. The quality of the bowel                            preparation was good. The ileocecal valve,                            appendiceal orifice, and rectum were photographed. Scope In: 7:27:57 AM Scope Out: 7:48:07 AM Scope Withdrawal Time: 0 hours 16 minutes 29 seconds  Total Procedure Duration: 0 hours 20 minutes 10 seconds  Findings:      A 3 mm polyp was found in the ascending colon. The polyp was sessile.       The polyp  was removed with a cold snare. Resection and retrieval were       complete.      The exam was otherwise without abnormality on direct and retroflexion       views. Impression:               - One 3 mm polyp in the ascending colon, removed                            with a cold snare. Resected and retrieved.                           - The examination was otherwise normal on direct                            and retroflexion views. Moderate Sedation:      Not Applicable - Patient had care per Anesthesia. Recommendation:           - Patient has a contact number available for                            emergencies. The signs and symptoms of potential                            delayed complications were discussed with the                            patient. Return to normal activities tomorrow.                             Written discharge instructions were provided to the                            patient.                           - Resume previous diet.                           - Continue present medications.                           You will receive a letter within 2-3 weeks with the                            pathology results and my final recommendations.                           If the polyp(s) is proven to be 'pre-cancerous' on                            pathology, you will need repeat colonoscopy in 5                            years. If the polyp(s) is NOT 'precancerous' on  pathology then you should repeat colon cancer                            screening in 10 years with colonoscopy without need                            for colon cancer screening by any method prior to                            then (including stool testing). Procedure Code(s):        --- Professional ---                           [phone], Colonoscopy, flexible; with removal of                            tumor(s), polyp(s), or other lesion(s) by snare                            technique Diagnosis Code(s):        --- Professional ---                           Z86.010, Personal history of colonic polyps                           D12.2, Benign neoplasm of ascending colon CPT copyright 2018 [employer]. All rights reserved. The codes documented in this report are preliminary and upon coder review may  be revised to meet current compliance requirements. Milus Banister, MD 01/17/2019 7:50:45 AM This report has been signed electronically. Number of Addenda: 0

## 2019-01-17 NOTE — Op Note (Signed)
[facility] Patient Name: Nicole Gonzalez Procedure Date: 01/17/2019 MRN: [id number] Attending MD: Milus Banister , MD Date of Birth: 12-09-1971 CSN: [id number] Age: 48 Admit Type: Outpatient Procedure:                Upper EUS Indications:              Screening pancreatic cancer; father has pancreatic                            cancer and she has CDKN2A genetic variant                            susceptibility Providers:                Milus Banister, MD, Burtis Junes, RN, Cherylynn Ridges,                            Technician, Heide Scales, CRNA Referring MD:             Zenovia Jarred, MD Medicines:                Monitored Anesthesia Care Complications:            No immediate complications. Estimated blood loss:                            None. Estimated Blood Loss:     Estimated blood loss: none. Procedure:                Pre-Anesthesia Assessment:                           - Prior to the procedure, a History and Physical                            was performed, and patient medications and                            allergies were reviewed. The patient's tolerance of                            previous anesthesia was also reviewed. The risks                            and benefits of the procedure and the sedation                            options and risks were discussed with the patient.                            All questions were answered, and informed consent                            was obtained. Prior Anticoagulants: The patient has                            taken  no previous anticoagulant or antiplatelet                            agents. ASA Grade Assessment: II - A patient with                            mild systemic disease. After reviewing the risks                            and benefits, the patient was deemed in                            satisfactory condition to undergo the procedure.                           After obtaining informed consent, the  endoscope was                            passed under direct vision. Throughout the                            procedure, the patient's blood pressure, pulse, and                            oxygen saturations were monitored continuously. The                            [id number] ([id number]) Olympus Radial EUS was                            introduced through the mouth, and advanced to the                            duodenal bulb. Scope In: Scope Out: Findings:      ENDOSCOPIC FINDING: :      The examined esophagus was endoscopically normal.      The entire examined stomach was endoscopically normal.      The examined duodenum was endoscopically normal.      ENDOSONOGRAPHIC FINDING: :      1. Pancreatic parenchyma was normal throughout; no discrete pancreatic       masses or signs of chronic pancreatitis.      2. Main pancreatic duct was normal, non-dilated      3. CBD was normal, non-dilated.      4. Two benign appearing periportal lymphnodes (1.3cm maximum, but       irregularly sharped, heterogeneous echotexture)      5. Limited views of the liver, spleen, gallbladder, portal and splenic       vessels were all normal. Impression:               - Normal pancreas. Moderate Sedation:      Not Applicable - Patient had care per Anesthesia. Recommendation:           - Discharge patient to home (ambulatory).                           -  MRI of pancreas in 12 months to continue elevated                            risk pancreatic cancer screening. Procedure Code(s):        --- Professional ---                           [phone], Esophagogastroduodenoscopy, flexible,                            transoral; with endoscopic ultrasound examination                            limited to the esophagus, stomach or duodenum, and                            adjacent structures Diagnosis Code(s):        --- Professional ---                           Z12.89, Encounter for screening for malignant                             neoplasm of other sites CPT copyright 2018 American Medical Association. All rights reserved. The codes documented in this report are preliminary and upon coder review may  be revised to meet current compliance requirements. Milus Banister, MD 01/17/2019 8:22:44 AM This report has been signed electronically. Number of Addenda: 0

## 2019-01-17 NOTE — Patient Instructions (Signed)
Influenza likely, Adult Influenza is also called "the flu." It is an infection in the lungs, nose, and throat (respiratory tract). It is caused by a virus. The flu causes symptoms that are similar to symptoms of a cold. It also causes a high fever and body aches. The flu spreads easily from person to person (is contagious). Getting a flu shot (influenza vaccination) every year is the best way to prevent the flu. What are the causes? This condition is caused by the influenza virus. You can get the virus by:  Breathing in droplets that are in the air from the cough or sneeze of a person who has the virus.  Touching something that has the virus on it (is contaminated) and then touching your mouth, nose, or eyes. What increases the risk? Certain things may make you more likely to get the flu. These include:  Not washing your hands often.  Having close contact with many people during cold and flu season.  Touching your mouth, eyes, or nose without first washing your hands.  Not getting a flu shot every year. You may have a higher risk for the flu, along with serious problems such as a lung infection (pneumonia), if you:  Are older than 65.  Are pregnant.  Have a weakened disease-fighting system (immune system) because of a disease or taking certain medicines.  Have a long-term (chronic) illness, such as: ? Heart, kidney, or lung disease. ? Diabetes. ? Asthma.  Have a liver disorder.  Are very overweight (morbidly obese).  Have anemia. This is a condition that affects your red blood cells. What are the signs or symptoms? Symptoms usually begin suddenly and last 4-14 days. They may include:  Fever and chills.  Headaches, body aches, or muscle aches.  Sore throat.  Cough.  Runny or stuffy (congested) nose.  Chest discomfort.  Not wanting to eat as much as normal (poor appetite).  Weakness or feeling tired (fatigue).  Dizziness.  Feeling sick to your stomach  (nauseous) or throwing up (vomiting). How is this treated? If the flu is found early, you can be treated with medicine that can help reduce how bad the illness is and how long it lasts (antiviral medicine). This may be given by mouth (orally) or through an IV tube. Taking care of yourself at home can help your symptoms get better. Your doctor may suggest:  Taking over-the-counter medicines.  Drinking plenty of fluids. The flu often goes away on its own. If you have very bad symptoms or other problems, you may be treated in a hospital. Follow these instructions at home:     Activity  Rest as needed. Get plenty of sleep.  Stay home from work or school as told by your doctor. ? Do not leave home until you do not have a fever for 24 hours without taking medicine. ? Leave home only to visit your doctor. Eating and drinking  Take an ORS (oral rehydration solution). This is a drink that is sold at pharmacies and stores.  Drink enough fluid to keep your pee (urine) pale yellow.  Drink clear fluids in small amounts as you are able. Clear fluids include: ? Water. ? Ice chips. ? Fruit juice that has water added (diluted fruit juice). ? Low-calorie sports drinks.  Eat bland, easy-to-digest foods in small amounts as you are able. These foods include: ? Bananas. ? Applesauce. ? Rice. ? Lean meats. ? Toast. ? Crackers.  Do not eat or drink: ? Fluids that have a  lot of sugar or caffeine. ? Alcohol. ? Spicy or fatty foods. General instructions  Take over-the-counter and prescription medicines only as told by your doctor.  Use a cool mist humidifier to add moisture to the air in your home. This can make it easier for you to breathe.  Cover your mouth and nose when you cough or sneeze.  Wash your hands with soap and water often, especially after you cough or sneeze. If you cannot use soap and water, use alcohol-based hand sanitizer.  Keep all follow-up visits as told by your  doctor. This is important. How is this prevented?   Get a flu shot every year. You may get the flu shot in late summer, fall, or winter. Ask your doctor when you should get your flu shot.  Avoid contact with people who are sick during fall and winter (cold and flu season). Contact a doctor if:  You get new symptoms.  You have: ? Chest pain. ? Watery poop (diarrhea). ? A fever.  Your cough gets worse.  You start to have more mucus.  You feel sick to your stomach.  You throw up. Get help right away if you:  Have shortness of breath.  Have trouble breathing.  Have skin or nails that turn a bluish color.  Have very bad pain or stiffness in your neck.  Get a sudden headache.  Get sudden pain in your face or ear.  Cannot eat or drink without throwing up. Summary  Influenza ("the flu") is an infection in the lungs, nose, and throat. It is caused by a virus.  Take over-the-counter and prescription medicines only as told by your doctor.  Getting a flu shot every year is the best way to avoid getting the flu. This information is not intended to replace advice given to you by your health care provider. Make sure you discuss any questions you have with your health care provider. Document Released: 09/06/2008 Document Revised: 05/16/2018 Document Reviewed: 05/16/2018 Elsevier Interactive Patient Education  2019 [employer].

## 2019-01-17 NOTE — Telephone Encounter (Signed)
Had EUS/Colon today and now temp is 102 degrees.  Also has headache.  She states she was around people at work with the flu.  Has an appt with PCP this afternoon to check for flu.  No abd pain, no bleeding.  Bowels have moved since procedure and was as she expected.  Please advise.

## 2019-01-17 NOTE — H&P (Signed)
HPI: This is a very pleasant 48 yo woman   Chief complaint is **personal history of adenomatous polyps, FH of pancreatic cancer, + genetic marker for panc cancer as well  ROS: complete GI ROS as described in HPI, all other review negative.  Constitutional:  No unintentional weight loss   Past Medical History:  Diagnosis Date  . Anemia     better after hysterectomy  . Cancer (HCC)    basil cell carcinoma/forehead  . Diabetes mellitus without complication ([place])   . DIABETES MELLITUS, GESTATIONAL, HX OF 06/02/2009   Qualifier: Diagnosis of  By: Regis Bill MD, Standley Brooking   . Diverticulosis   . Family history of breast cancer   . Family history of genetic disease carrier    father has CDKN2A and ATM mutation  . Family history of melanoma   . Family history of pancreatic cancer   . Fracture of lower leg     fall 2011  . GERD (gastroesophageal reflux disease)   . History of basal cell carcinoma excision   . History of gestational diabetes    x2  . HTN (hypertension)   . Hyperlipidemia    [id number] trx   . Increased frequency of headaches 03/15/2012  . Internal hemorrhoids   . Obesity   . Seizures ([place])    as an infant d/t fever  . Tubular adenoma of colon   . UNSPECIFIED TACHYCARDIA 09/01/2010   Qualifier: Diagnosis of  By: Charlett Blake MD, Erline Levine      Past Surgical History:  Procedure Laterality Date  . ABDOMINAL HYSTERECTOMY  06/29/2010    question had abnormal cells on Path, fibroids  . BASAL CELL CARCINOMA EXCISION    . BREAST BIOPSY Right 2009  . TONSILLECTOMY      Current Facility-Administered Medications  Medication Dose Route Frequency Provider Last Rate Last Dose  . 0.9 %  sodium chloride infusion   Intravenous Continuous Milus Banister, MD      . lactated ringers infusion   Intravenous Continuous Milus Banister, MD 10 mL/hr at 01/17/19 0650 1,000 mL at 01/17/19 0650    Allergies as of 12/17/2018 - Review Complete 10/30/2018  Allergen Reaction Noted  . Contrast  media [iodinated diagnostic agents] Other (See Comments) 04/25/2013  . Adhesive [tape]  09/24/2014  . Other  03/07/2011  . Stadol [butorphanol]  04/25/2013    Family History  Problem Relation Age of Onset  . Melanoma Father        x 3 times, first in 38's  . Hypertension Father   . Hyperlipidemia Father   . Squamous cell carcinoma Father   . Colon polyps Father   . Heart disease Father   . Pancreatic cancer Father 50       CDKN2A muation  . Diabetes Maternal Grandmother   . Hyperlipidemia Maternal Grandmother   . Heart disease Maternal Grandmother   . Arthritis Brother         Psoriatic  . Pancreatic cancer Paternal Grandmother 74       met liver  . Heart disease Paternal Grandfather   . Breast cancer Brother 48       ATM + mutation? or VUS  . Skin cancer Maternal Grandfather   . Breast cancer Other   . Skin cancer Cousin     Social History   Socioeconomic History  . Marital status: Married    Spouse name: Not on file  . Number of children: 2  . Years of education: Not on  file  . Highest education level: Not on file  Occupational History  . Occupation: Barista: [employer]  . Financial resource strain: Not on file  . Food insecurity:    Worry: Not on file    Inability: Not on file  . Transportation needs:    Medical: Not on file    Non-medical: Not on file  Tobacco Use  . Smoking status: Never Smoker  . Smokeless tobacco: Never Used  Substance and Sexual Activity  . Alcohol use: No  . Drug use: No  . Sexual activity: Not on file  Lifestyle  . Physical activity:    Days per week: Not on file    Minutes per session: Not on file  . Stress: Not on file  Relationships  . Social connections:    Talks on phone: Not on file    Gets together: Not on file    Attends religious service: Not on file    Active member of club or organization: Not on file    Attends meetings of clubs or organizations: Not on file     Relationship status: Not on file  . Intimate partner violence:    Fear of current or ex partner: Not on file    Emotionally abused: Not on file    Physically abused: Not on file    Forced sexual activity: Not on file  Other Topics Concern  . 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    Nonsmoker married     Physical Exam: BP 137/76   Pulse 81   Temp 98.5 F (36.9 C)   Resp 20   Ht _0  (1.626 m)   Wt 93 kg   SpO2 95%   BMI 35.19 kg/m  Constitutional: generally well-appearing Psychiatric: alert and oriented x3 Abdomen: soft, nontender, nondistended, no obvious ascites, no peritoneal signs, normal bowel sounds No peripheral edema noted in lower extremities  Assessment and plan: 48 y.o. female with personal history of adenomatous polyps, elevated risk for pancreatic cancer (FH +, genetics +)  For colonoscoyp and upper EUS today  Please see the "Patient Instructions" section for addition details about the plan.  Owens Loffler, MD [facility] 01/17/2019, 7:05 AM

## 2019-01-17 NOTE — Transfer of Care (Signed)
Immediate Anesthesia Transfer of Care Note  Patient: Nicole Gonzalez  Procedure(s) Performed: Procedure(s): UPPER ENDOSCOPIC ULTRASOUND (EUS) RADIAL (N/A) COLONOSCOPY WITH PROPOFOL (N/A) POLYPECTOMY ESOPHAGOGASTRODUODENOSCOPY (EGD) WITH PROPOFOL (N/A)  Patient Location: PACU  Anesthesia Type:MAC  Level of Consciousness: Patient easily awoken, sedated, comfortable, cooperative, following commands, responds to stimulation.   Airway & Oxygen Therapy: Patient spontaneously breathing, ventilating well, oxygen via simple oxygen mask.  Post-op Assessment: Report given to PACU RN, vital signs reviewed and stable, moving all extremities.   Post vital signs: Reviewed and stable.  Complications: No apparent anesthesia complications   Last Vitals:  Vitals Value Taken Time  BP 104/78 01/17/2019  8:24 AM  Temp    Pulse 92 01/17/2019  8:25 AM  Resp 18 01/17/2019  8:25 AM  SpO2 94 % 01/17/2019  8:25 AM  Vitals shown include unvalidated device data.  Last Pain:  Vitals:   01/17/19 0617  PainSc: 0-No pain         Complications: No apparent anesthesia complications

## 2019-01-17 NOTE — Discharge Instructions (Signed)
YOU HAD AN ENDOSCOPIC PROCEDURE TODAY: Refer to the procedure report and other information in the discharge instructions given to you for any specific questions about what was found during the examination. If this information does not answer your questions, please call [facility] office at [phone] to clarify.  ° °YOU SHOULD EXPECT: Some feelings of bloating in the abdomen. Passage of more gas than usual. Walking can help get rid of the air that was put into your GI tract during the procedure and reduce the bloating. If you had a lower endoscopy (such as a colonoscopy or flexible sigmoidoscopy) you may notice spotting of blood in your stool or on the toilet paper. Some abdominal soreness may be present for a day or two, also. ° °DIET: Your first meal following the procedure should be a light meal and then it is ok to progress to your normal diet. A half-sandwich or bowl of soup is an example of a good first meal. Heavy or fried foods are harder to digest and may make you feel nauseous or bloated. Drink plenty of fluids but you should avoid alcoholic beverages for 24 hours. If you had a esophageal dilation, please see attached instructions for diet.   ° °ACTIVITY: Your care partner should take you home directly after the procedure. You should plan to take it easy, moving slowly for the rest of the day. You can resume normal activity the day after the procedure however YOU SHOULD NOT DRIVE, use power tools, machinery or perform tasks that involve climbing or major physical exertion for 24 hours (because of the sedation medicines used during the test).  ° °SYMPTOMS TO REPORT IMMEDIATELY: °A gastroenterologist can be reached at any hour. Please call [phone]  for any of the following symptoms:  °Following lower endoscopy (colonoscopy, flexible sigmoidoscopy) °Excessive amounts of blood in the stool  °Significant tenderness, worsening of abdominal pains  °Swelling of the abdomen that is new, acute  °Fever of 100° or  higher  °Following upper endoscopy (EGD, EUS, ERCP, esophageal dilation) °Vomiting of blood or coffee ground material  °New, significant abdominal pain  °New, significant chest pain or pain under the shoulder blades  °Painful or persistently difficult swallowing  °New shortness of breath  °Black, tarry-looking or red, bloody stools ° °FOLLOW UP:  °If any biopsies were taken you will be contacted by phone or by letter within the next 1-3 weeks. Call [phone]  if you have not heard about the biopsies in 3 weeks.  °Please also call with any specific questions about appointments or follow up tests. ° °

## 2019-01-17 NOTE — Addendum Note (Signed)
Addendum  created 01/17/19 0952 by Deliah Boston, CRNA   Intraprocedure Meds edited

## 2019-01-17 NOTE — Progress Notes (Signed)
HPI:  Using dictation device. Unfortunately this device frequently misinterprets words/phrases.  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  Past Medical History:  Diagnosis Date  . Anemia     better after hysterectomy  . Cancer (HCC)    basil cell carcinoma/forehead  . Diabetes mellitus without complication ([place])   . DIABETES MELLITUS, GESTATIONAL, HX OF 06/02/2009   Qualifier: Diagnosis of  By: Regis Bill MD, Standley Brooking   . Diverticulosis   . Family history of breast cancer   . Family history of genetic disease carrier    father has CDKN2A and ATM mutation  . Family history of melanoma   . Family history of pancreatic cancer   . Fracture of lower leg     fall 2011  . GERD (gastroesophageal reflux disease)   . History of basal cell carcinoma excision   . History of gestational diabetes    x2  . HTN (hypertension)   . Hyperlipidemia    [id number] 043 trx   . Increased frequency of headaches 03/15/2012  . Internal hemorrhoids   . Obesity   . Seizures ([place])    as an infant d/t fever  . Tubular adenoma of colon   . UNSPECIFIED TACHYCARDIA 09/01/2010   Qualifier: Diagnosis of  By: Charlett Blake MD, Erline Levine      Past Surgical History:  Procedure Laterality Date  . ABDOMINAL HYSTERECTOMY  06/29/2010    question had abnormal cells on Path, fibroids  . BASAL CELL CARCINOMA EXCISION    . BREAST BIOPSY Right 2009  . TONSILLECTOMY      Family History  Problem Relation Age of  Onset  . Melanoma Father        x 3 times, first in 5's  . Hypertension Father   . Hyperlipidemia Father   . Squamous cell carcinoma Father   . Colon polyps Father   . Heart disease Father   . Pancreatic cancer Father 44       CDKN2A muation  . Diabetes Maternal Grandmother   . Hyperlipidemia Maternal Grandmother   . Heart disease Maternal Grandmother   . Arthritis Brother         Psoriatic  . Pancreatic cancer Paternal Grandmother 21       met liver  . Heart disease Paternal Grandfather   . Breast cancer Brother 23       ATM + mutation? or VUS  . Skin cancer Maternal Grandfather   . Breast cancer Other   . Skin cancer Cousin     SOCIAL HX: see hpi   Current Outpatient Medications:  .  BAYER CONTOUR NEXT TEST test strip, Use as instructed, Disp: 100 each, Rfl: 11 .  fexofenadine (ALLEGRA) 180 MG tablet, Take 180 mg by mouth daily as needed (allergies.). In the morning , Disp: , Rfl:  .  glucose blood (BAYER CONTOUR TEST) test strip, Test 1-2 times daily Dx E11.9, Disp: 100 each, Rfl: 12 .  ibuprofen (ADVIL,MOTRIN) 200 MG tablet, Take 600  mg by mouth every 8 (eight) hours as needed (pain)., Disp: , Rfl:  .  Lancets MISC, Use Bayer Contour Next One lancets to test blood sugar 1-2 times daily, Disp: 100 each, Rfl: 12 .  levocetirizine (XYZAL) 5 MG tablet, Take 5 mg by mouth every evening. , Disp: , Rfl:  .  lisinopril (PRINIVIL,ZESTRIL) 10 MG tablet, TAKE 1 TABLET BY MOUTH DAILY FOR HIGH BLOOD PRESSURE (Patient taking differently: Take 10 mg by mouth every evening. ), Disp: 90 tablet, Rfl: 1 .  metFORMIN (GLUCOPHAGE-XR) 500 MG 24 hr tablet, TAKE 3 TABLET BY MOUTH DAILY WITH BREAKFAST. GENERIC EQUIVALENT FOR GLUCOPHAGE XR (Patient taking differently: Take 1,000 mg by mouth every evening. GENERIC EQUIVALENT FOR GLUCOPHAGE XR), Disp: 270 tablet, Rfl: 1 .  montelukast (SINGULAIR) 10 MG tablet, Take 10 mg by mouth every evening. , Disp: , Rfl:  .  Multiple Vitamin (MULTIVITAMIN WITH  MINERALS) TABS tablet, Take 1 tablet by mouth daily., Disp: , Rfl:  .  omeprazole (PRILOSEC) 20 MG capsule, Take 20 mg by mouth every evening. , Disp: , Rfl:  .  QNASL 80 MCG/ACT AERS, Place 2 sprays into the nose every evening. , Disp: , Rfl:  .  simvastatin (ZOCOR) 40 MG tablet, TAKE 1 TABLET BY MOUTH DAILY AT BEDTIME (Patient taking differently: Take 40 mg by mouth every evening. ), Disp: 90 tablet, Rfl: 0 .  oseltamivir (TAMIFLU) 75 MG capsule, Take 1 capsule (75 mg total) by mouth 2 (two) times daily., Disp: 10 capsule, Rfl: 0  EXAM:  Vitals:   01/17/19 1553  BP: 122/74  Pulse: 90  Temp: 99.4 F (37.4 C)  SpO2: 95%    Body mass index is 35.07 kg/m.  GENERAL: vitals reviewed and listed above, alert, oriented, appears well hydrated and in no acute distress  HEENT: atraumatic, conjunttiva clear, no obvious abnormalities on inspection of external nose and ears, normal appearance of ear canals and TMs, clear nasal congestion, mild post oropharyngeal erythema with PND, no tonsillar edema or exudate, no sinus TTP  NECK: no obvious masses on inspection, no meningeal signs, no sig LAD  LUNGS: clear to auscultation bilaterally, no wheezes, rales or rhonchi, good air movement  CV: HRRR, no peripheral edema  MS: moves all extremities without noticeable abnormality  PSYCH: pleasant and cooperative, no obvious depression or anxiety  ASSESSMENT AND PLAN:  Discussed the following assessment and plan:  Influenza-like illness - Plan: POC Rapid Strep A, POC Influenza A&B(BINAX/QUICKVUE)  We discussed potential/likely etiologies, with influenza being likely or possible vs. viral infection or other. We discussed risks/benefits/indications/best timing for tamiflu, symptomatic care, likely course, transmission, potential complications, signs of developing a serious illness and return and emergency precuations. She wanted flu testing, also doing strep testing. Offered tamiflu regardless of test  result given chance of false negative and classic symptoms mild flu in peak of flu season with hx diabetes and morbid obesity. Advised prompt re-evaluation for any worsening, SOB, severe symptoms or not improving as expected.   Patient Instructions  Influenza likely, Adult Influenza is also called "the flu." It is an infection in the lungs, nose, and throat (respiratory tract). It is caused by a virus. The flu causes symptoms that are similar to symptoms of a cold. It also causes a high fever and body aches. The flu spreads easily from person to person (is contagious). Getting a flu shot (influenza vaccination) every year is the best way to prevent the flu. What are the causes? This condition is  caused by the influenza virus. You can get the virus by:  Breathing in droplets that are in the air from the cough or sneeze of a person who has the virus.  Touching something that has the virus on it (is contaminated) and then touching your mouth, nose, or eyes. What increases the risk? Certain things may make you more likely to get the flu. These include:  Not washing your hands often.  Having close contact with many people during cold and flu season.  Touching your mouth, eyes, or nose without first washing your hands.  Not getting a flu shot every year. You may have a higher risk for the flu, along with serious problems such as a lung infection (pneumonia), if you:  Are older than 65.  Are pregnant.  Have a weakened disease-fighting system (immune system) because of a disease or taking certain medicines.  Have a long-term (chronic) illness, such as: ? Heart, kidney, or lung disease. ? Diabetes. ? Asthma.  Have a liver disorder.  Are very overweight (morbidly obese).  Have anemia. This is a condition that affects your red blood cells. What are the signs or symptoms? Symptoms usually begin suddenly and last 4-14 days. They may include:  Fever and chills.  Headaches, body aches,  or muscle aches.  Sore throat.  Cough.  Runny or stuffy (congested) nose.  Chest discomfort.  Not wanting to eat as much as normal (poor appetite).  Weakness or feeling tired (fatigue).  Dizziness.  Feeling sick to your stomach (nauseous) or throwing up (vomiting). How is this treated? If the flu is found early, you can be treated with medicine that can help reduce how bad the illness is and how long it lasts (antiviral medicine). This may be given by mouth (orally) or through an IV tube. Taking care of yourself at home can help your symptoms get better. Your doctor may suggest:  Taking over-the-counter medicines.  Drinking plenty of fluids. The flu often goes away on its own. If you have very bad symptoms or other problems, you may be treated in a hospital. Follow these instructions at home:     Activity  Rest as needed. Get plenty of sleep.  Stay home from work or school as told by your doctor. ? Do not leave home until you do not have a fever for 24 hours without taking medicine. ? Leave home only to visit your doctor. Eating and drinking  Take an ORS (oral rehydration solution). This is a drink that is sold at pharmacies and stores.  Drink enough fluid to keep your pee (urine) pale yellow.  Drink clear fluids in small amounts as you are able. Clear fluids include: ? Water. ? Ice chips. ? Fruit juice that has water added (diluted fruit juice). ? Low-calorie sports drinks.  Eat bland, easy-to-digest foods in small amounts as you are able. These foods include: ? Bananas. ? Applesauce. ? Rice. ? Lean meats. ? Toast. ? Crackers.  Do not eat or drink: ? Fluids that have a lot of sugar or caffeine. ? Alcohol. ? Spicy or fatty foods. General instructions  Take over-the-counter and prescription medicines only as told by your doctor.  Use a cool mist humidifier to add moisture to the air in your home. This can make it easier for you to breathe.  Cover your  mouth and nose when you cough or sneeze.  Wash your hands with soap and water often, especially after you cough or sneeze. If you cannot use  soap and water, use alcohol-based hand sanitizer.  Keep all follow-up visits as told by your doctor. This is important. How is this prevented?   Get a flu shot every year. You may get the flu shot in late summer, fall, or winter. Ask your doctor when you should get your flu shot.  Avoid contact with people who are sick during fall and winter (cold and flu season). Contact a doctor if:  You get new symptoms.  You have: ? Chest pain. ? Watery poop (diarrhea). ? A fever.  Your cough gets worse.  You start to have more mucus.  You feel sick to your stomach.  You throw up. Get help right away if you:  Have shortness of breath.  Have trouble breathing.  Have skin or nails that turn a bluish color.  Have very bad pain or stiffness in your neck.  Get a sudden headache.  Get sudden pain in your face or ear.  Cannot eat or drink without throwing up. Summary  Influenza ("the flu") is an infection in the lungs, nose, and throat. It is caused by a virus.  Take over-the-counter and prescription medicines only as told by your doctor.  Getting a flu shot every year is the best way to avoid getting the flu. This information is not intended to replace advice given to you by your health care provider. Make sure you discuss any questions you have with your health care provider. Document Released: 09/06/2008 Document Revised: 05/16/2018 Document Reviewed: 05/16/2018 Elsevier Interactive Patient Education  2019 [place], [place]

## 2019-01-17 NOTE — Anesthesia Procedure Notes (Signed)
Procedure Name: MAC Date/Time: 01/17/2019 7:23 AM Performed by: Deliah Boston, CRNA Pre-anesthesia Checklist: Patient identified, Emergency Drugs available, Suction available and Patient being monitored Patient Re-evaluated:Patient Re-evaluated prior to induction Oxygen Delivery Method: Simple face mask Preoxygenation: Pre-oxygenation with 100% oxygen Induction Type: IV induction Placement Confirmation: positive ETCO2 and breath sounds checked- equal and bilateral

## 2019-01-18 ENCOUNTER — Encounter (HOSPITAL_COMMUNITY): Payer: Self-pay | Admitting: Gastroenterology

## 2019-01-18 NOTE — Telephone Encounter (Signed)
Thanks.  Given lack of abd pains, GI symptoms this is probably not related to the procedures.  She saw PCP and they agreed, was a flu-like illness.

## 2019-03-01 ENCOUNTER — Other Ambulatory Visit: Payer: Self-pay | Admitting: Internal Medicine

## 2019-04-16 ENCOUNTER — Telehealth: Payer: Self-pay | Admitting: Internal Medicine

## 2019-04-16 DIAGNOSIS — E119 Type 2 diabetes mellitus without complications: Secondary | ICD-10-CM

## 2019-04-16 DIAGNOSIS — Z0000 Encounter for general adult medical examination without abnormal findings: Secondary | ICD-10-CM

## 2019-04-16 DIAGNOSIS — E785 Hyperlipidemia, unspecified: Secondary | ICD-10-CM

## 2019-04-16 DIAGNOSIS — I10 Essential (primary) hypertension: Secondary | ICD-10-CM

## 2019-04-16 DIAGNOSIS — Z79899 Other long term (current) drug therapy: Secondary | ICD-10-CM

## 2019-04-16 NOTE — Telephone Encounter (Signed)
Left detailed message for pt okay for nurse triage to disclose

## 2019-04-16 NOTE — Telephone Encounter (Signed)
Pt has been scheduled for a CPE on August on 7 at 10:30 and she would like to come in before hand to do her labs due to her being a diabetic and can not fast that long. May I please have orders for the lab work?

## 2019-04-16 NOTE — Telephone Encounter (Signed)
I have placed future orders so she can get lab ahead of visit

## 2019-04-17 MED ORDER — GLUCOSE BLOOD VI STRP: ORAL_STRIP | 12 refills | Status: DC

## 2019-04-17 MED ORDER — LANCETS MISC: 12 refills | Status: DC

## 2019-04-17 MED ORDER — METFORMIN HCL ER 500 MG PO TB24: ORAL_TABLET | ORAL | 1 refills | Status: DC

## 2019-04-17 NOTE — Telephone Encounter (Signed)
Called pt to make lab appointment and spoke with pt and she wants to come in the same day just a little sooner to have her labs done.  I let the pt know that that is okay b/c the labs orders are in.

## 2019-04-17 NOTE — Addendum Note (Signed)
Addended by: Modena Morrow R on: 04/17/2019 10:52 AM   Modules accepted: Orders

## 2019-04-17 NOTE — Addendum Note (Signed)
Addended by: Modena Morrow R on: 04/17/2019 08:46 AM   Modules accepted: Orders

## 2019-04-19 ENCOUNTER — Other Ambulatory Visit: Payer: Self-pay

## 2019-04-19 MED ORDER — GLUCOSE BLOOD VI STRP: ORAL_STRIP | 12 refills | Status: DC

## 2019-06-05 ENCOUNTER — Other Ambulatory Visit: Payer: Self-pay | Admitting: Internal Medicine

## 2019-06-05 NOTE — Progress Notes (Signed)
Virtual Visit via Video Note  I connected with@ on 06/06/19 at  9:00 AM EDT by a video enabled telemedicine application and verified that I am speaking with the correct person using two identifiers. Location patient: home Location provider:work  office Persons participating in the virtual visit: patient, provider  WIth national recommendations  regarding COVID 19 pandemic   video visit is advised over in office visit for this patient.  Patient aware  of the limitations of evaluation and management by telemedicine and  availability of in person appointments. and agreed to proceed.   HPI: Nicole Gonzalez presents for video visit for a  Few reasons    06/04/19 11:44 AM I am currently working at home due to Covid-19 but my employer is requesting a note that I am high risk due to my diabetes so that I may continue to work from home.    Please let me know what I can do to get the note.   They just informed us today.  The memo is attached.   My cell is [phone] and house phone is [phone].  Thanks Nicole Gonzalez  Attachments DM am in 140 range but good rest of day   taking 1500 mg metformin per day.  Some sweats at night but "not menopausal" as per her gyne  hasnt checked weight recently but last 209  Some down   No recent bp check  ocass dizziness in am   Has cpx appt for August and due for  Tdap update   Had flu like fever after endo colon in FEB  ? Could have been anesthesia only last 12 hours  Didn't take tamiflu?  ROS: See pertinent positives and negatives per HPI. No current fever feels generally well. Has  Had testing for  Genetic  pred for pancreatic cancer  And has fu s with gi had eus and panc screening testing   Past Medical History:  Diagnosis Date  . Anemia     better after hysterectomy  . Cancer (HCC)    basil cell carcinoma/forehead  . Diabetes mellitus without complication ([place])   . DIABETES MELLITUS, GESTATIONAL, HX OF 06/02/2009   Qualifier: Diagnosis of  By:  Regis Bill MD, Standley Brooking   . Diverticulosis   . Family history of breast cancer   . Family history of genetic disease carrier    father has CDKN2A and ATM mutation  . Family history of melanoma   . Family history of pancreatic cancer   . Fracture of lower leg     fall 2011  . GERD (gastroesophageal reflux disease)   . History of basal cell carcinoma excision   . History of gestational diabetes    x2  . HTN (hypertension)   . Hyperlipidemia    [id number] 043 trx   . Increased frequency of headaches 03/15/2012  . Internal hemorrhoids   . Obesity   . Seizures ([place])    as an infant d/t fever  . Tubular adenoma of colon   . UNSPECIFIED TACHYCARDIA 09/01/2010   Qualifier: Diagnosis of  By: Charlett Blake MD, Erline Levine      Past Surgical History:  Procedure Laterality Date  . ABDOMINAL HYSTERECTOMY  06/29/2010    question had abnormal cells on Path, fibroids  . BASAL CELL CARCINOMA EXCISION    . BREAST BIOPSY Right 2009  . COLONOSCOPY WITH PROPOFOL N/A 01/17/2019   Procedure: COLONOSCOPY WITH PROPOFOL;  Surgeon: Milus Banister, MD;  Location: [facility];  Service: Endoscopy;  Laterality: N/A;  .  ESOPHAGOGASTRODUODENOSCOPY (EGD) WITH PROPOFOL N/A 01/17/2019   Procedure: ESOPHAGOGASTRODUODENOSCOPY (EGD) WITH PROPOFOL;  Surgeon: Milus Banister, MD;  Location: [facility];  Service: Endoscopy;  Laterality: N/A;  . EUS N/A 01/17/2019   Procedure: UPPER ENDOSCOPIC ULTRASOUND (EUS) RADIAL;  Surgeon: Milus Banister, MD;  Location: [facility];  Service: Endoscopy;  Laterality: N/A;  . POLYPECTOMY  01/17/2019   Procedure: POLYPECTOMY;  Surgeon: Milus Banister, MD;  Location: [facility];  Service: Endoscopy;;  . TONSILLECTOMY      Family History  Problem Relation Age of Onset  . Melanoma Father        x 3 times, first in 40's  . Hypertension Father   . Hyperlipidemia Father   . Squamous cell carcinoma Father   . Colon polyps Father   . Heart disease Father   . Pancreatic cancer Father 44        CDKN2A muation  . Diabetes Maternal Grandmother   . Hyperlipidemia Maternal Grandmother   . Heart disease Maternal Grandmother   . Arthritis Brother         Psoriatic  . Pancreatic cancer Paternal Grandmother 16       met liver  . Heart disease Paternal Grandfather   . Breast cancer Brother 68       ATM + mutation? or VUS  . Skin cancer Maternal Grandfather   . Breast cancer Other   . Skin cancer Cousin     Social History   Tobacco Use  . Smoking status: Never Smoker  . Smokeless tobacco: Never Used  Substance Use Topics  . Alcohol use: No  . Drug use: No      Current Outpatient Medications:  .  BAYER CONTOUR NEXT TEST test strip, Use as instructed, Disp: 100 each, Rfl: 11 .  fexofenadine (ALLEGRA) 180 MG tablet, Take 180 mg by mouth daily as needed (allergies.). In the morning , Disp: , Rfl:  .  glucose blood (BAYER CONTOUR TEST) test strip, Next strips Test 1-2 times daily Dx E11.9, Disp: 100 each, Rfl: 12 .  glucose blood test strip, Use as instructed, Disp: 100 each, Rfl: 12 .  ibuprofen (ADVIL,MOTRIN) 200 MG tablet, Take 600 mg by mouth every 8 (eight) hours as needed (pain)., Disp: , Rfl:  .  Lancets MISC, Use Bayer Contour Next One lancets to test blood sugar 1-2 times daily, Disp: 100 each, Rfl: 12 .  levocetirizine (XYZAL) 5 MG tablet, Take 5 mg by mouth every evening. , Disp: , Rfl:  .  lisinopril (PRINIVIL,ZESTRIL) 10 MG tablet, TAKE 1 TABLET BY MOUTH DAILY FOR HIGH BLOOD PRESSURE, Disp: 90 tablet, Rfl: 1 .  metFORMIN (GLUCOPHAGE-XR) 500 MG 24 hr tablet, TAKE 3 TABLETS BY MOUTH EVERY DAY WITH BREAKFAST, Disp: 270 tablet, Rfl: 1 .  montelukast (SINGULAIR) 10 MG tablet, Take 10 mg by mouth every evening. , Disp: , Rfl:  .  Multiple Vitamin (MULTIVITAMIN WITH MINERALS) TABS tablet, Take 1 tablet by mouth daily., Disp: , Rfl:  .  omeprazole (PRILOSEC) 20 MG capsule, Take 20 mg by mouth every evening. , Disp: , Rfl:  .  oseltamivir (TAMIFLU) 75 MG capsule, Take 1  capsule (75 mg total) by mouth 2 (two) times daily., Disp: 10 capsule, Rfl: 0 .  QNASL 80 MCG/ACT AERS, Place 2 sprays into the nose every evening. , Disp: , Rfl:  .  simvastatin (ZOCOR) 40 MG tablet, TAKE 1 TABLET BY MOUTH DAILY AT BEDTIME, Disp: 90 tablet, Rfl: 0  EXAM: BP  Readings from Last 3 Encounters:  01/17/19 122/74  01/17/19 (!) 100/57  10/30/18 98/64    VITALS per patient if applicable:  GENERAL: alert, oriented, appears well and in no acute distress  HEENT: atraumatic, conjunttiva clear, no obvious abnormalities on inspection of external nose and ears  NECK: normal movements of the head and neck  LUNGS: on inspection no signs of respiratory distress, breathing rate appears normal, no obvious gross SOB, gasping or wheezing  CV: no obvious cyanosis  MS: moves all visible extremities without noticeable abnormality  PSYCH/NEURO: pleasant and cooperative, no obvious depression or anxiety, speech and thought processing grossly intact Lab Results  Component Value Date   WBC 6.4 04/10/2018   HGB 14.3 04/10/2018   HCT 41.1 04/10/2018   PLT 215.0 04/10/2018   GLUCOSE 162 (H) 04/10/2018   CHOL 173 04/10/2018   TRIG 92.0 04/10/2018   HDL 47.70 04/10/2018   LDLDIRECT 172.4 10/02/2007   LDLCALC 107 (H) 04/10/2018   ALT 24 04/10/2018   AST 19 04/10/2018   NA 138 04/10/2018   K 4.4 04/10/2018   CL 104 04/10/2018   CREATININE 0.46 04/10/2018   BUN 13 04/10/2018   CO2 24 04/10/2018   TSH 1.71 04/10/2018   HGBA1C 8.0 (H) 04/10/2018   MICROALBUR <0.7 04/10/2018    ASSESSMENT AND PLAN:  Discussed the following assessment and plan:    ICD-10-CM   1. Diabetes mellitus without complication (HCC)  X83.3   2. Educated About Covid-19 Virus Infection  Z71.89   3. Medication management  Z79.899   4. Essential hypertension  I10   5. Hyperlipidemia, unspecified hyperlipidemia type  E78.5   6. 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 Obesity with serious  comorbidity, unspecified classification, unspecified obesity type  E66.9    Fasting lab appt  Due  Doubt  Low bg but can check working on fasting  cbg  healthy weight loss and follow  Also check BP readings   Can consider inc metformin to 2000 mg per day but will wait on labs and fu.  There are other options  Also  Healthy weeight loss can help the most and advise work on this and weight every 1-2 weeks to keep track   Lett for work  To be done  Has cpx in August .  On the books to see then unless needed in interim  Agree with letter to employer as to high risk condition Counseled.   Expectant management and discussion of plan and treatment with opportunity to ask questions and all were answered. The patient agreed with the plan and demonstrated an understanding of the instructions.   Advised to call back or seek an in-person evaluation if worsening  or having  further concerns . In interim    Shanon Ace, MD

## 2019-06-06 ENCOUNTER — Ambulatory Visit (INDEPENDENT_AMBULATORY_CARE_PROVIDER_SITE_OTHER): Payer: BC Managed Care – PPO | Admitting: Internal Medicine

## 2019-06-06 ENCOUNTER — Other Ambulatory Visit: Payer: Self-pay

## 2019-06-06 ENCOUNTER — Encounter: Payer: Self-pay | Admitting: Internal Medicine

## 2019-06-06 DIAGNOSIS — Z1501 Genetic susceptibility to malignant neoplasm of breast: Secondary | ICD-10-CM

## 2019-06-06 DIAGNOSIS — Z7189 Other specified counseling: Secondary | ICD-10-CM

## 2019-06-06 DIAGNOSIS — E119 Type 2 diabetes mellitus without complications: Secondary | ICD-10-CM | POA: Diagnosis not present

## 2019-06-06 DIAGNOSIS — Z79899 Other long term (current) drug therapy: Secondary | ICD-10-CM

## 2019-06-06 DIAGNOSIS — E785 Hyperlipidemia, unspecified: Secondary | ICD-10-CM

## 2019-06-06 DIAGNOSIS — I10 Essential (primary) hypertension: Secondary | ICD-10-CM

## 2019-06-06 DIAGNOSIS — E669 Obesity, unspecified: Secondary | ICD-10-CM

## 2019-06-06 DIAGNOSIS — Z1509 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: Secondary | ICD-10-CM

## 2019-06-06 NOTE — Telephone Encounter (Signed)
Madison can  You fix the letter by adding that she has Diabetes   thanks

## 2019-06-11 ENCOUNTER — Other Ambulatory Visit (INDEPENDENT_AMBULATORY_CARE_PROVIDER_SITE_OTHER): Payer: BC Managed Care – PPO

## 2019-06-11 ENCOUNTER — Other Ambulatory Visit: Payer: Self-pay

## 2019-06-11 DIAGNOSIS — E119 Type 2 diabetes mellitus without complications: Secondary | ICD-10-CM | POA: Diagnosis not present

## 2019-06-11 DIAGNOSIS — Z0000 Encounter for general adult medical examination without abnormal findings: Secondary | ICD-10-CM | POA: Diagnosis not present

## 2019-06-11 DIAGNOSIS — I10 Essential (primary) hypertension: Secondary | ICD-10-CM | POA: Diagnosis not present

## 2019-06-11 DIAGNOSIS — Z79899 Other long term (current) drug therapy: Secondary | ICD-10-CM

## 2019-06-11 DIAGNOSIS — E785 Hyperlipidemia, unspecified: Secondary | ICD-10-CM

## 2019-06-11 LAB — CBC WITH DIFFERENTIAL/PLATELET
Basophils Absolute: 0 10*3/uL (ref 0.0–0.1)
Basophils Relative: 0.4 % (ref 0.0–3.0)
Eosinophils Absolute: 0.2 10*3/uL (ref 0.0–0.7)
Eosinophils Relative: 2.3 % (ref 0.0–5.0)
HCT: 41.7 % (ref 36.0–46.0)
Hemoglobin: 13.8 g/dL (ref 12.0–15.0)
Lymphocytes Relative: 24.6 % (ref 12.0–46.0)
Lymphs Abs: 1.9 10*3/uL (ref 0.7–4.0)
MCHC: 33.2 g/dL (ref 30.0–36.0)
MCV: 87.8 fl (ref 78.0–100.0)
Monocytes Absolute: 0.5 10*3/uL (ref 0.1–1.0)
Monocytes Relative: 6.2 % (ref 3.0–12.0)
Neutro Abs: 5.2 10*3/uL (ref 1.4–7.7)
Neutrophils Relative %: 66.5 % (ref 43.0–77.0)
Platelets: 234 10*3/uL (ref 150.0–400.0)
RBC: 4.75 Mil/uL (ref 3.87–5.11)
RDW: 13.2 % (ref 11.5–15.5)
WBC: 7.8 10*3/uL (ref 4.0–10.5)

## 2019-06-11 LAB — HEPATIC FUNCTION PANEL
ALT: 15 U/L (ref 0–35)
AST: 11 U/L (ref 0–37)
Albumin: 4.5 g/dL (ref 3.5–5.2)
Alkaline Phosphatase: 57 U/L (ref 39–117)
Bilirubin, Direct: 0.1 mg/dL (ref 0.0–0.3)
Total Bilirubin: 0.6 mg/dL (ref 0.2–1.2)
Total Protein: 6.8 g/dL (ref 6.0–8.3)

## 2019-06-11 LAB — LIPID PANEL
Cholesterol: 141 mg/dL (ref 0–200)
HDL: 44.9 mg/dL (ref >39.00)
LDL Cholesterol: 86 mg/dL (ref 0–99)
NonHDL: 96.51
Total CHOL/HDL Ratio: 3
Triglycerides: 53 mg/dL (ref 0.0–149.0)
VLDL: 10.6 mg/dL (ref 0.0–40.0)

## 2019-06-11 LAB — MICROALBUMIN / CREATININE URINE RATIO
Creatinine,U: 142.9 mg/dL
Microalb Creat Ratio: 0.6 mg/g (ref 0.0–30.0)
Microalb, Ur: 0.8 mg/dL (ref 0.0–1.9)

## 2019-06-11 LAB — BASIC METABOLIC PANEL
BUN: 16 mg/dL (ref 6–23)
CO2: 27 mEq/L (ref 19–32)
Calcium: 9.2 mg/dL (ref 8.4–10.5)
Chloride: 102 mEq/L (ref 96–112)
Creatinine, Ser: 0.59 mg/dL (ref 0.40–1.20)
GFR: 108.22 mL/min (ref >60.00)
Glucose, Bld: 125 mg/dL — ABNORMAL HIGH (ref 70–99)
Potassium: 4.4 mEq/L (ref 3.5–5.1)
Sodium: 138 mEq/L (ref 135–145)

## 2019-06-11 LAB — HEMOGLOBIN A1C: Hgb A1c MFr Bld: 6.2 % (ref 4.6–6.5)

## 2019-06-11 LAB — TSH: TSH: 1.82 u[IU]/mL (ref 0.35–4.50)

## 2019-06-17 ENCOUNTER — Other Ambulatory Visit: Payer: Self-pay | Admitting: Internal Medicine

## 2019-06-17 DIAGNOSIS — Z1231 Encounter for screening mammogram for malignant neoplasm of breast: Secondary | ICD-10-CM

## 2019-06-25 DIAGNOSIS — L309 Dermatitis, unspecified: Secondary | ICD-10-CM | POA: Diagnosis not present

## 2019-07-12 IMAGING — DX DG LUMBAR SPINE 2-3V
3 series · 3 of 3 positions shown · non-contrast
Comparison: None.

CLINICAL DATA: Chronic low back pain.

EXAM:
LUMBAR SPINE - 2-3 VIEW

[lumbar spine ap]
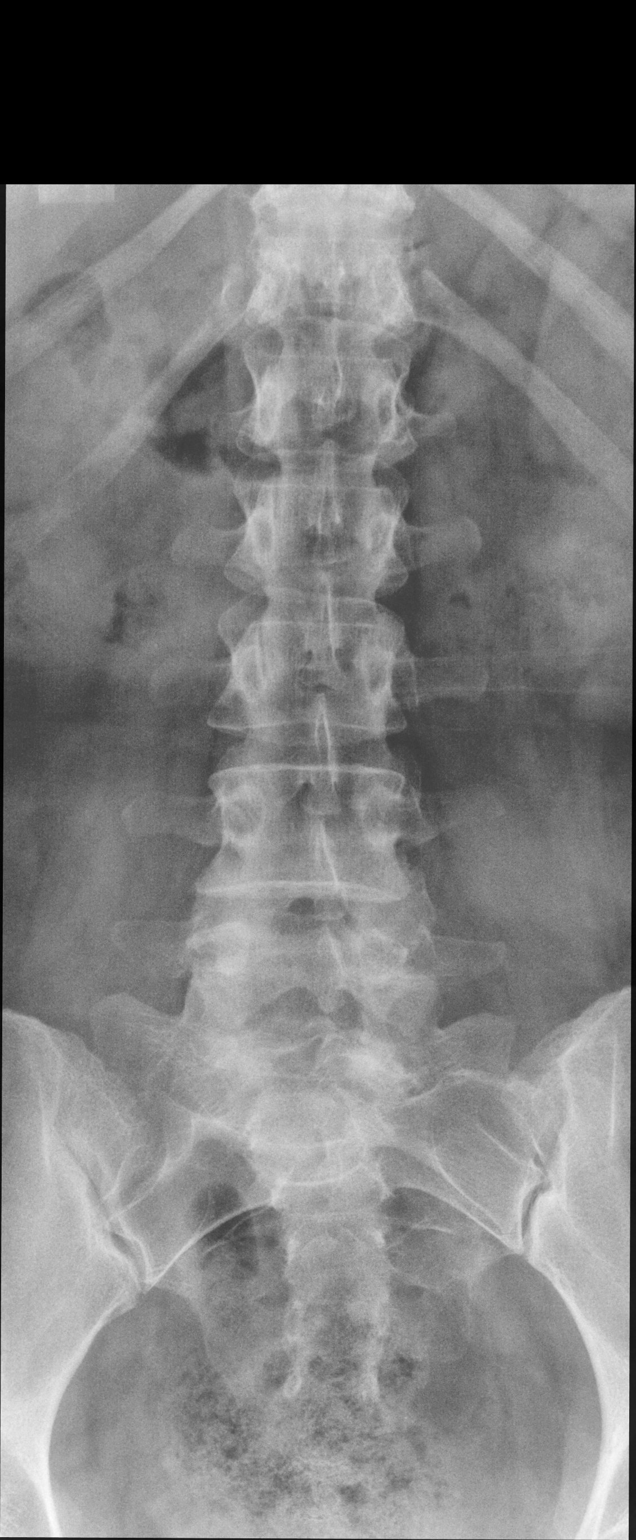

[lumbar spine lat (1 of 2)]
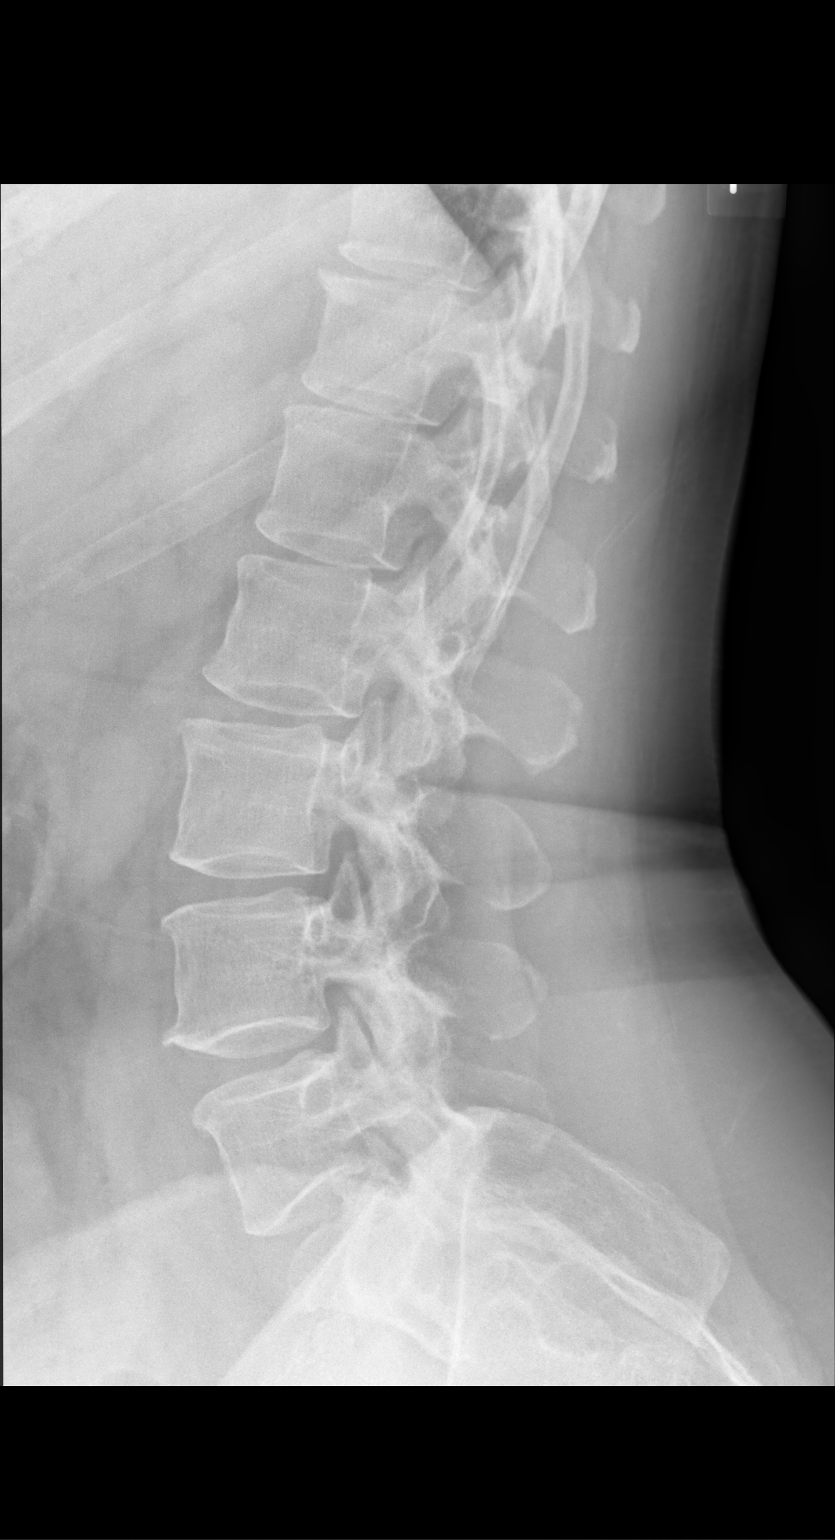

[lumbar spine lat (2 of 2)]
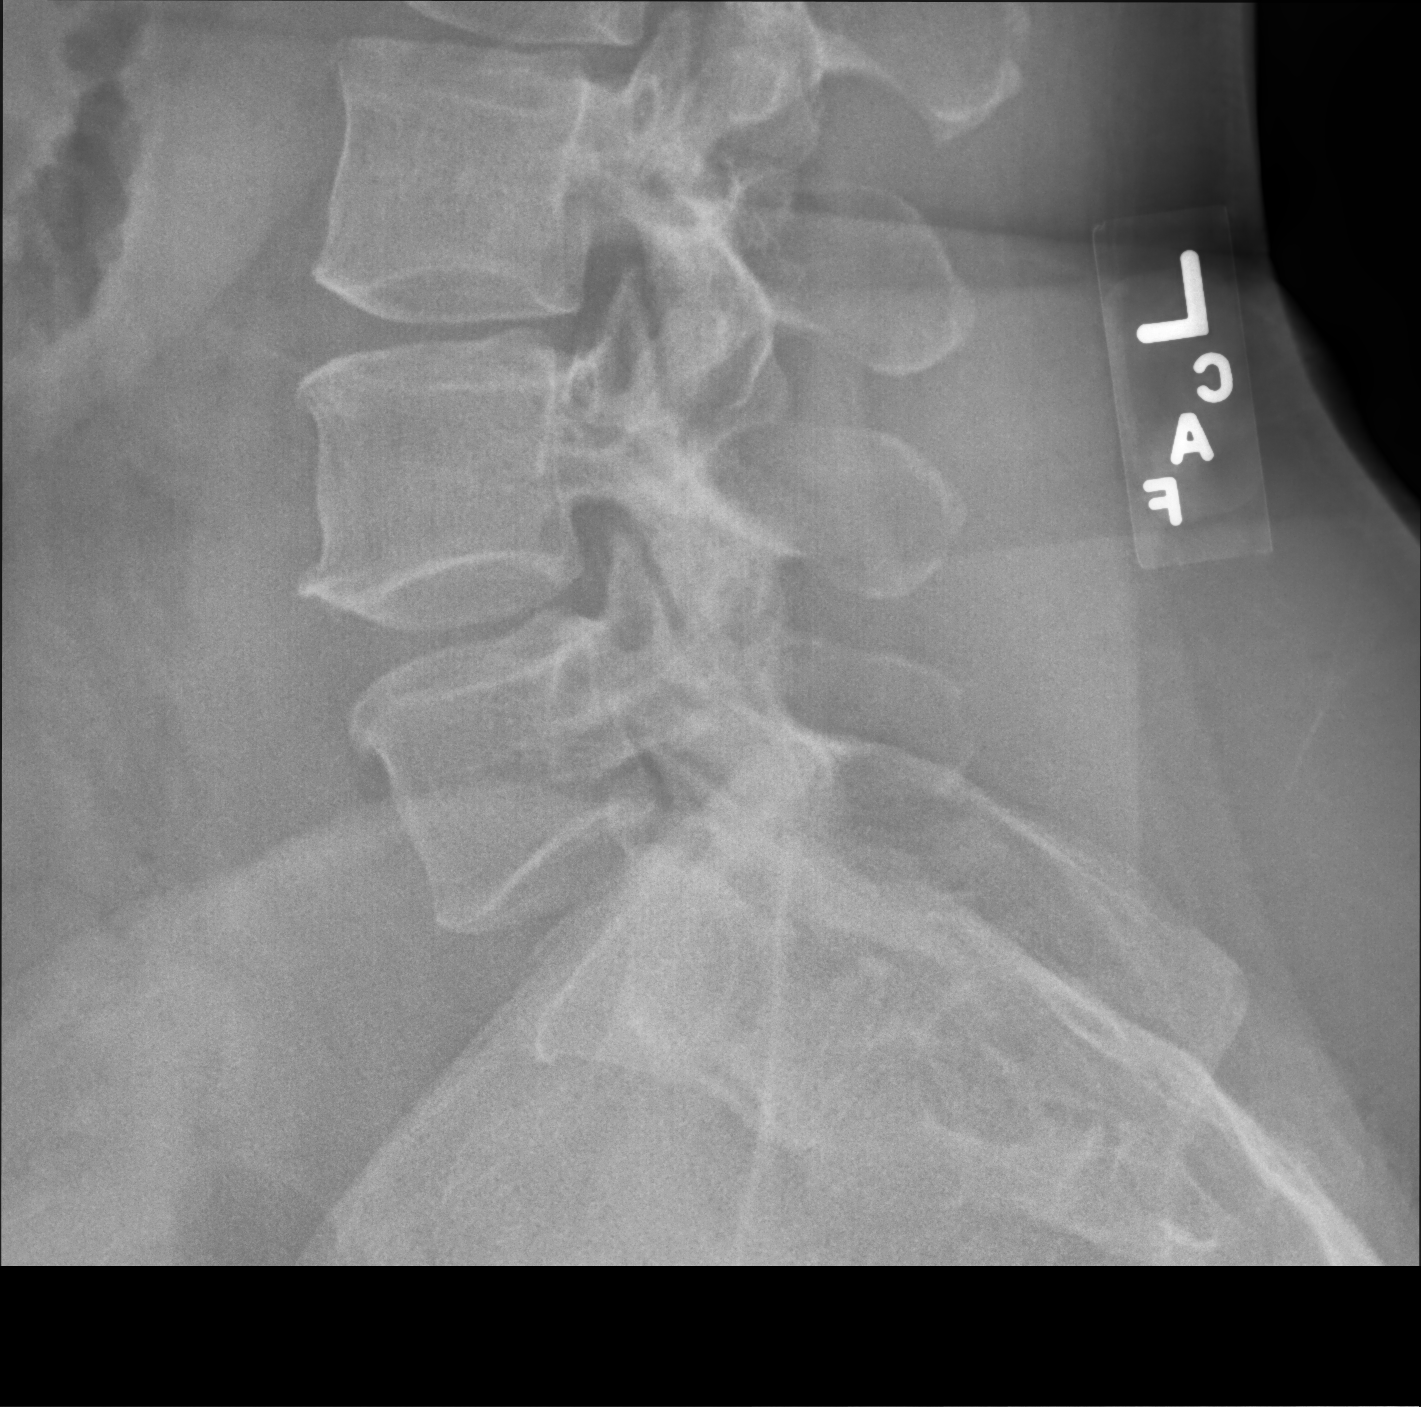

[3 of 3 positions shown; findings below may reference images not displayed]

FINDINGS: No fracture or traumatic malalignment. Multilevel degenerative disc
disease with small to moderate anterior osteophytes.
IMPRESSION: Degenerative disc disease.

## 2019-07-16 NOTE — Progress Notes (Signed)
Chief Complaint  Patient presents with  . Annual Exam    Pt has no concerns only needs tdap.   Marland Kitchen Hypertension  . Diabetes  . Medication Management  . Hyperlipidemia    HPI: Patient  Nicole Gonzalez  48 y.o. comes in today for Preventive Health Care visit  And Chronic disease management   Under  monitoring  For  Pancreatic  Cancer risk and cdkna2 gene mutation BG:1500 mg metofrmin per day  Working on diet exercise and weight management Left ear hearing comes and goes  No pain  See previous  VV HT controlled  On med  hld   Me no se  Dermatology follows every 6 months for now   Health Maintenance  Topic Date Due  . HIV Screening  03/06/1985  . PAP SMEAR-Modifier  03/26/2016  . FOOT EXAM  04/11/2019  . INFLUENZA VACCINE  07/13/2019  . HEMOGLOBIN A1C  12/11/2019  . OPHTHALMOLOGY EXAM  01/02/2020  . COLONOSCOPY  01/18/2024  . TETANUS/TDAP  07/18/2029  . PNEUMOCOCCAL POLYSACCHARIDE VACCINE AGE 59-64 HIGH RISK  Completed   Health Maintenance Review LIFESTYLE:  Exercise:   Trying to do 6 k  .  Before  Work   Tobacco/ETS: no Alcohol:  no Sugar beverages:  no Sleep: a problem  Awake 2-3  At times.  6.5-7.5  Drug use: no HH of   3  no Work:  July 27 back to work .  Eye check     ROS:  GEN/ HEENT: No fever, significant weight changes sweats headaches vision problems hearing changes, CV/ PULM; No chest pain shortness of breath cough, syncope,edema  change in exercise tolerance. GI /GU: No adominal pain, vomiting, change in bowel habits. No blood in the stool. No significant GU symptoms. SKIN/HEME: ,no acute skin rashes suspicious lesions or bleeding. No lymphadenopathy, nodules, masses.  NEURO/ PSYCH:  No neurologic signs such as weakness numbness. No depression anxiety. IMM/ Allergy: No unusual infections.  Allergy .   REST of 12 system review negative except as per HPI   Past Medical History:  Diagnosis Date  . Anemia     better after hysterectomy  . Cancer (HCC)    basil cell carcinoma/forehead  . Diabetes mellitus without complication ([place])   . DIABETES MELLITUS, GESTATIONAL, HX OF 06/02/2009   Qualifier: Diagnosis of  By: Regis Bill MD, Standley Brooking   . Diverticulosis   . Family history of breast cancer   . Family history of genetic disease carrier    father has CDKN2A and ATM mutation  . Family history of melanoma   . Family history of pancreatic cancer   . Fracture of lower leg     fall 2011  . GERD (gastroesophageal reflux disease)   . History of basal cell carcinoma excision   . History of gestational diabetes    x2  . HTN (hypertension)   . Hyperlipidemia    [id number] trx   . Increased frequency of headaches 03/15/2012  . Internal hemorrhoids   . Obesity   . Seizures ([place])    as an infant d/t fever  . Tubular adenoma of colon   . UNSPECIFIED TACHYCARDIA 09/01/2010   Qualifier: Diagnosis of  By: Charlett Blake MD, Erline Levine      Past Surgical History:  Procedure Laterality Date  . ABDOMINAL HYSTERECTOMY  06/29/2010    question had abnormal cells on Path, fibroids  . BASAL CELL CARCINOMA EXCISION    . BREAST BIOPSY Right 2009  .  COLONOSCOPY WITH PROPOFOL N/A 01/17/2019   Procedure: COLONOSCOPY WITH PROPOFOL;  Surgeon: Milus Banister, MD;  Location: [facility];  Service: Endoscopy;  Laterality: N/A;  . ESOPHAGOGASTRODUODENOSCOPY (EGD) WITH PROPOFOL N/A 01/17/2019   Procedure: ESOPHAGOGASTRODUODENOSCOPY (EGD) WITH PROPOFOL;  Surgeon: Milus Banister, MD;  Location: [facility];  Service: Endoscopy;  Laterality: N/A;  . EUS N/A 01/17/2019   Procedure: UPPER ENDOSCOPIC ULTRASOUND (EUS) RADIAL;  Surgeon: Milus Banister, MD;  Location: [facility];  Service: Endoscopy;  Laterality: N/A;  . POLYPECTOMY  01/17/2019   Procedure: POLYPECTOMY;  Surgeon: Milus Banister, MD;  Location: [facility];  Service: Endoscopy;;  . TONSILLECTOMY      Family History  Problem Relation Age of Onset  . Melanoma Father        x 3 times, first in 18's  . Hypertension  Father   . Hyperlipidemia Father   . Squamous cell carcinoma Father   . Colon polyps Father   . Heart disease Father   . Pancreatic cancer Father 48       CDKN2A muation  . Diabetes Maternal Grandmother   . Hyperlipidemia Maternal Grandmother   . Heart disease Maternal Grandmother   . Arthritis Brother         Psoriatic  . Pancreatic cancer Paternal Grandmother 32       met liver  . Heart disease Paternal Grandfather   . Breast cancer Brother 42       ATM + mutation? or VUS  . Skin cancer Maternal Grandfather   . Breast cancer Other   . Skin cancer Cousin     Social History   Socioeconomic History  . Marital status: Married    Spouse name: Not on file  . Number of children: 2  . Years of education: Not on file  . Highest education level: Not on file  Occupational History  . Occupation: Barista: [employer]  . Financial resource strain: Not on file  . Food insecurity    Worry: Not on file    Inability: Not on file  . Transportation needs    Medical: Not on file    Non-medical: Not on file  Tobacco Use  . Smoking status: Never Smoker  . Smokeless tobacco: Never Used  Substance and Sexual Activity  . Alcohol use: No  . Drug use: No  . Sexual activity: Not on file  Lifestyle  . Physical activity    Days per week: Not on file    Minutes per session: Not on file  . Stress: Not on file  Relationships  . Social Herbalist on phone: Not on file    Gets together: Not on file    Attends religious service: Not on file    Active member of club or organization: Not on file    Attends meetings of clubs or organizations: Not on file    Relationship status: Not on file  Other Topics Concern  . 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    Nonsmoker married    Outpatient Medications Prior  to Visit  Medication Sig Dispense Refill  . BAYER CONTOUR NEXT TEST test strip Use as instructed 100 each  11  . fexofenadine (ALLEGRA) 180 MG tablet Take 180 mg by mouth daily as needed (allergies.). In the morning     . glucose blood (BAYER CONTOUR TEST) test strip Next strips Test 1-2 times daily Dx E11.9 100 each 12  . glucose blood test strip Use as instructed 100 each 12  . ibuprofen (ADVIL,MOTRIN) 200 MG tablet Take 600 mg by mouth every 8 (eight) hours as needed (pain).    . Lancets MISC Use Bayer Contour Next One lancets to test blood sugar 1-2 times daily 100 each 12  . levocetirizine (XYZAL) 5 MG tablet Take 5 mg by mouth every evening.     Marland Kitchen lisinopril (PRINIVIL,ZESTRIL) 10 MG tablet TAKE 1 TABLET BY MOUTH DAILY FOR HIGH BLOOD PRESSURE 90 tablet 1  . metFORMIN (GLUCOPHAGE-XR) 500 MG 24 hr tablet TAKE 3 TABLETS BY MOUTH EVERY DAY WITH BREAKFAST 270 tablet 1  . montelukast (SINGULAIR) 10 MG tablet Take 10 mg by mouth every evening.     . Multiple Vitamin (MULTIVITAMIN WITH MINERALS) TABS tablet Take 1 tablet by mouth daily.    Marland Kitchen omeprazole (PRILOSEC) 20 MG capsule Take 20 mg by mouth every evening.     Norvel Richards 80 MCG/ACT AERS Place 2 sprays into the nose every evening.     . simvastatin (ZOCOR) 40 MG tablet TAKE 1 TABLET BY MOUTH DAILY AT BEDTIME 90 tablet 0  . oseltamivir (TAMIFLU) 75 MG capsule Take 1 capsule (75 mg total) by mouth 2 (two) times daily. 10 capsule 0   No facility-administered medications prior to visit.      EXAM:  BP 126/68 (BP Location: Right Arm, Patient Position: Sitting, Cuff Size: Large)   Pulse 85   Temp 97.6 F (36.4 C) (Temporal)   Ht _0  (1.626 m)   Wt 195 lb (88.5 kg)   SpO2 99%   BMI 33.47 kg/m   Body mass index is 33.47 kg/m. Wt Readings from Last 3 Encounters:  07/19/19 195 lb (88.5 kg)  01/17/19 204 lb 4.8 oz (92.7 kg)  01/17/19 205 lb (93 kg)    Physical Exam: Vital signs reviewed [id number] is a well-developed well-nourished  alert cooperative    who appearsr stated age in no acute distress.  HEENT: normocephalic atraumatic , Eyes: PERRL EOM's full, conjunctiva clear, Nares: paten,t no deformity discharge or tenderness., Ears: no deformity EAC's left 3+ wax  After irrigation   TMs with normal landmarks. Mouth: deferred  maskNECK: supple without masses, thyromegaly or bruits. CHEST/PULM:  Clear to auscultation and percussion breath sounds equal no wheeze , rales or rhonchi. No chest wall deformities or tenderness. Breast: normal by inspection . No dimpling, discharge, masses, tenderness or discharge . CV: PMI is nondisplaced, S1 S2 no gallops, murmurs, rubs. Peripheral pulses are full without delay.No JVD .  ABDOMEN: Bowel sounds normal nontender  No guard or rebound, no hepato splenomegal no CVA tenderness.  No hernia. Extremtities:  No clubbing cyanosis or edema, no acute joint swelling or redness no focal atrophy NEURO:  Oriented x3, cranial nerves 3-12 appear to be intact, no obvious focal weakness,gait within normal limits no abnormal reflexes or asymmetrical SKIN: No acute rashes normal turgor, color, no bruising or petechiae. PSYCH: Oriented, good eye contact, no obvious depression anxiety, cognition and judgment appear normal. LN: no cervical axillary inguinal adenopathy  Lab Results  Component Value Date   WBC 7.8 06/11/2019   HGB 13.8 06/11/2019   HCT 41.7 06/11/2019   PLT 234.0 06/11/2019  GLUCOSE 125 (H) 06/11/2019   CHOL 141 06/11/2019   TRIG 53.0 06/11/2019   HDL 44.90 06/11/2019   LDLDIRECT 172.4 10/02/2007   LDLCALC 86 06/11/2019   ALT 15 06/11/2019   AST 11 06/11/2019   NA 138 06/11/2019   K 4.4 06/11/2019   CL 102 06/11/2019   CREATININE 0.59 06/11/2019   BUN 16 06/11/2019   CO2 27 06/11/2019   TSH 1.82 06/11/2019   HGBA1C 6.2 06/11/2019   MICROALBUR 0.8 06/11/2019    BP Readings from Last 3 Encounters:  07/19/19 126/68  01/17/19 122/74  01/17/19 (!) 100/57   Diabetic Foot Exam  - Simple   Simple Foot Form Diabetic Foot exam was performed with the following findings: Yes 07/19/2019 11:10 AM  Visual Inspection No deformities, no ulcerations, no other skin breakdown bilaterally: Yes Sensation Testing Intact to touch and monofilament testing bilaterally: Yes Pulse Check Posterior Tibialis and Dorsalis pulse intact bilaterally: Yes Comments      Lab results reviewed with patient   ASSESSMENT AND PLAN:  Discussed the following assessment and plan:    ICD-10-CM   1. Visit for preventive health examination  Z00.00   2. Medication management  Z79.899   3. Diabetes mellitus without complication ([place])  Z61.0   4. Monoallelic mutation of RUEA5W gene  Z15.09    Z15.01   5. Hyperlipidemia, unspecified hyperlipidemia type  E78.5   6. Essential hypertension  I10   7. Need for tetanus, diphtheria, and acellular pertussis (Tdap) vaccine  Z23 Tdap vaccine greater than or equal to 7yo IM  8. Impacted cerumen of left ear  H61.22    sx improved after irrigation nl tm  9. BMI 33.0-33.9,adult  Z68.33   utd  tdap today   Doing well on weight management and a1c   To continue Continue lifestyle intervention healthy eating and exercise . And medications Ear better after wax removal .    Patient Care Team: Cleo Santucci, Standley Brooking, MD as PCP - General Gwendolyn Fill, MD as Attending Physician (Obstetrics and Gynecology) Tiajuana Amass, MD (Allergy) [place], Jennefer Bravo, MD (Dermatology) Pyrtle, Lajuan Lines, MD as Consulting Physician (Gastroenterology) Patient Instructions   Continue lifestyle intervention healthy eating and exercise .    Plan rov with hg a1c early January or  When appropriate  Health Maintenance, Female Adopting a healthy lifestyle and getting preventive care are important in promoting health and wellness. Ask your health care provider about:  The right schedule for you to have regular tests and exams.  Things you can do on your own to prevent diseases and  keep yourself healthy. What should I know about diet, weight, and exercise? Eat a healthy diet   Eat a diet that includes plenty of vegetables, fruits, low-fat dairy products, and lean protein.  Do not eat a lot of foods that are high in solid fats, added sugars, or sodium. Maintain a healthy weight Body mass index (BMI) is used to identify weight problems. It estimates body fat based on height and weight. Your health care provider can help determine your BMI and help you achieve or maintain a healthy weight. Get regular exercise Get regular exercise. This is one of the most important things you can do for your health. Most adults should:  Exercise for at least 150 minutes each week. The exercise should increase your heart rate and make you sweat (moderate-intensity exercise).  Do strengthening exercises at least twice a week. This is in addition to the moderate-intensity exercise.  Spend less time sitting. Even light physical activity can be beneficial. Watch cholesterol and blood lipids Have your blood tested for lipids and cholesterol at 49 years of age, then have this test every 5 years. Have your cholesterol levels checked more often if:  Your lipid or cholesterol levels are high.  You are older than 49 years of age.  You are at high risk for heart disease. What should I know about cancer screening? Depending on your health history and family history, you may need to have cancer screening at various ages. This may include screening for:  Breast cancer.  Cervical cancer.  Colorectal cancer.  Skin cancer.  Lung cancer. What should I know about heart disease, diabetes, and high blood pressure? Blood pressure and heart disease  High blood pressure causes heart disease and increases the risk of stroke. This is more likely to develop in people who have high blood pressure readings, are of African descent, or are overweight.  Have your blood pressure checked: ? Every 3-5  years if you are 36-60 years of age. ? Every year if you are 81 years old or older. Diabetes Have regular diabetes screenings. This checks your fasting blood sugar level. Have the screening done:  Once every three years after age 49 if you are at a normal weight and have a low risk for diabetes.  More often and at a younger age if you are overweight or have a high risk for diabetes. What should I know about preventing infection? Hepatitis B If you have a higher risk for hepatitis B, you should be screened for this virus. Talk with your health care provider to find out if you are at risk for hepatitis B infection. Hepatitis C Testing is recommended for:  Everyone born from [birth year] through 1966.  Anyone with known risk factors for hepatitis C. Sexually transmitted infections (STIs)  Get screened for STIs, including gonorrhea and chlamydia, if: ? You are sexually active and are younger than 49 years of age. ? You are older than 49 years of age and your health care provider tells you that you are at risk for this type of infection. ? Your sexual activity has changed since you were last screened, and you are at increased risk for chlamydia or gonorrhea. Ask your health care provider if you are at risk.  Ask your health care provider about whether you are at high risk for HIV. Your health care provider may recommend a prescription medicine to help prevent HIV infection. If you choose to take medicine to prevent HIV, you should first get tested for HIV. You should then be tested every 3 months for as long as you are taking the medicine. Pregnancy  If you are about to stop having your period (premenopausal) and you may become pregnant, seek counseling before you get pregnant.  Take 400 to 800 micrograms (mcg) of folic acid every day if you become pregnant.  Ask for birth control (contraception) if you want to prevent pregnancy. Osteoporosis and menopause Osteoporosis is a disease in which the  bones lose minerals and strength with aging. This can result in bone fractures. If you are 80 years old or older, or if you are at risk for osteoporosis and fractures, ask your health care provider if you should:  Be screened for bone loss.  Take a calcium or vitamin D supplement to lower your risk of fractures.  Be given hormone replacement therapy (HRT) to treat symptoms of menopause. Follow these instructions at  home: Lifestyle  Do not use any products that contain nicotine or tobacco, such as cigarettes, e-cigarettes, and chewing tobacco. If you need help quitting, ask your health care provider.  Do not use street drugs.  Do not share needles.  Ask your health care provider for help if you need support or information about quitting drugs. Alcohol use  Do not drink alcohol if: ? Your health care provider tells you not to drink. ? You are pregnant, may be pregnant, or are planning to become pregnant.  If you drink alcohol: ? Limit how much you use to 0-1 drink a day. ? Limit intake if you are breastfeeding.  Be aware of how much alcohol is in your drink. In the [place] one drink equals one 12 oz bottle of beer (355 mL), one 5 oz glass of wine (148 mL), or one 1 oz glass of hard liquor (44 mL). General instructions  Schedule regular health, dental, and eye exams.  Stay current with your vaccines.  Tell your health care provider if: ? You often feel depressed. ? You have ever been abused or do not feel safe at home. Summary  Adopting a healthy lifestyle and getting preventive care are important in promoting health and wellness.  Follow your health care provider's instructions about healthy diet, exercising, and getting tested or screened for diseases.  Follow your health care provider's instructions on monitoring your cholesterol and blood pressure. This information is not intended to replace advice given to you by your health care provider. Make sure you discuss any  questions you have with your health care provider. Document Released: 06/13/2011 Document Revised: 11/21/2018 Document Reviewed: 11/21/2018 Elsevier Patient Education  2020 Jud Lareina Espino M.D.

## 2019-07-19 ENCOUNTER — Other Ambulatory Visit: Payer: Self-pay

## 2019-07-19 ENCOUNTER — Encounter: Payer: Self-pay | Admitting: Internal Medicine

## 2019-07-19 ENCOUNTER — Ambulatory Visit (INDEPENDENT_AMBULATORY_CARE_PROVIDER_SITE_OTHER): Payer: BC Managed Care – PPO | Admitting: Internal Medicine

## 2019-07-19 VITALS — BP 126/68 | HR 85 | Temp 97.6°F | Ht 64.0 in | Wt 195.0 lb

## 2019-07-19 DIAGNOSIS — Z79899 Other long term (current) drug therapy: Secondary | ICD-10-CM

## 2019-07-19 DIAGNOSIS — Z1501 Genetic susceptibility to malignant neoplasm of breast: Secondary | ICD-10-CM

## 2019-07-19 DIAGNOSIS — E119 Type 2 diabetes mellitus without complications: Secondary | ICD-10-CM

## 2019-07-19 DIAGNOSIS — Z0000 Encounter for general adult medical examination without abnormal findings: Secondary | ICD-10-CM | POA: Diagnosis not present

## 2019-07-19 DIAGNOSIS — E785 Hyperlipidemia, unspecified: Secondary | ICD-10-CM

## 2019-07-19 DIAGNOSIS — H6122 Impacted cerumen, left ear: Secondary | ICD-10-CM

## 2019-07-19 DIAGNOSIS — Z23 Encounter for immunization: Secondary | ICD-10-CM | POA: Diagnosis not present

## 2019-07-19 DIAGNOSIS — Z1509 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: Secondary | ICD-10-CM

## 2019-07-19 DIAGNOSIS — I10 Essential (primary) hypertension: Secondary | ICD-10-CM

## 2019-07-19 DIAGNOSIS — Z6833 Body mass index (BMI) 33.0-33.9, adult: Secondary | ICD-10-CM | POA: Diagnosis not present

## 2019-07-19 NOTE — Patient Instructions (Signed)
Continue lifestyle intervention healthy eating and exercise .    Plan rov with hg a1c early January or  When appropriate  Health Maintenance, Female Adopting a healthy lifestyle and getting preventive care are important in promoting health and wellness. Ask your health care provider about:  The right schedule for you to have regular tests and exams.  Things you can do on your own to prevent diseases and keep yourself healthy. What should I know about diet, weight, and exercise? Eat a healthy diet   Eat a diet that includes plenty of vegetables, fruits, low-fat dairy products, and lean protein.  Do not eat a lot of foods that are high in solid fats, added sugars, or sodium. Maintain a healthy weight Body mass index (BMI) is used to identify weight problems. It estimates body fat based on height and weight. Your health care provider can help determine your BMI and help you achieve or maintain a healthy weight. Get regular exercise Get regular exercise. This is one of the most important things you can do for your health. Most adults should:  Exercise for at least 150 minutes each week. The exercise should increase your heart rate and make you sweat (moderate-intensity exercise).  Do strengthening exercises at least twice a week. This is in addition to the moderate-intensity exercise.  Spend less time sitting. Even light physical activity can be beneficial. Watch cholesterol and blood lipids Have your blood tested for lipids and cholesterol at 49 years of age, then have this test every 5 years. Have your cholesterol levels checked more often if:  Your lipid or cholesterol levels are high.  You are older than 49 years of age.  You are at high risk for heart disease. What should I know about cancer screening? Depending on your health history and family history, you may need to have cancer screening at various ages. This may include screening for:  Breast cancer.  Cervical  cancer.  Colorectal cancer.  Skin cancer.  Lung cancer. What should I know about heart disease, diabetes, and high blood pressure? Blood pressure and heart disease  High blood pressure causes heart disease and increases the risk of stroke. This is more likely to develop in people who have high blood pressure readings, are of African descent, or are overweight.  Have your blood pressure checked: ? Every 3-5 years if you are 29-95 years of age. ? Every year if you are 61 years old or older. Diabetes Have regular diabetes screenings. This checks your fasting blood sugar level. Have the screening done:  Once every three years after age 20 if you are at a normal weight and have a low risk for diabetes.  More often and at a younger age if you are overweight or have a high risk for diabetes. What should I know about preventing infection? Hepatitis B If you have a higher risk for hepatitis B, you should be screened for this virus. Talk with your health care provider to find out if you are at risk for hepatitis B infection. Hepatitis C Testing is recommended for:  Everyone born from [birth year] through 1966.  Anyone with known risk factors for hepatitis C. Sexually transmitted infections (STIs)  Get screened for STIs, including gonorrhea and chlamydia, if: ? You are sexually active and are younger than 49 years of age. ? You are older than 49 years of age and your health care provider tells you that you are at risk for this type of infection. ? Your sexual activity  has changed since you were last screened, and you are at increased risk for chlamydia or gonorrhea. Ask your health care provider if you are at risk.  Ask your health care provider about whether you are at high risk for HIV. Your health care provider may recommend a prescription medicine to help prevent HIV infection. If you choose to take medicine to prevent HIV, you should first get tested for HIV. You should then be tested every 3  months for as long as you are taking the medicine. Pregnancy  If you are about to stop having your period (premenopausal) and you may become pregnant, seek counseling before you get pregnant.  Take 400 to 800 micrograms (mcg) of folic acid every day if you become pregnant.  Ask for birth control (contraception) if you want to prevent pregnancy. Osteoporosis and menopause Osteoporosis is a disease in which the bones lose minerals and strength with aging. This can result in bone fractures. If you are 60 years old or older, or if you are at risk for osteoporosis and fractures, ask your health care provider if you should:  Be screened for bone loss.  Take a calcium or vitamin D supplement to lower your risk of fractures.  Be given hormone replacement therapy (HRT) to treat symptoms of menopause. Follow these instructions at home: Lifestyle  Do not use any products that contain nicotine or tobacco, such as cigarettes, e-cigarettes, and chewing tobacco. If you need help quitting, ask your health care provider.  Do not use street drugs.  Do not share needles.  Ask your health care provider for help if you need support or information about quitting drugs. Alcohol use  Do not drink alcohol if: ? Your health care provider tells you not to drink. ? You are pregnant, may be pregnant, or are planning to become pregnant.  If you drink alcohol: ? Limit how much you use to 0-1 drink a day. ? Limit intake if you are breastfeeding.  Be aware of how much alcohol is in your drink. In the [place] one drink equals one 12 oz bottle of beer (355 mL), one 5 oz glass of wine (148 mL), or one 1 oz glass of hard liquor (44 mL). General instructions  Schedule regular health, dental, and eye exams.  Stay current with your vaccines.  Tell your health care provider if: ? You often feel depressed. ? You have ever been abused or do not feel safe at home. Summary  Adopting a healthy lifestyle and getting  preventive care are important in promoting health and wellness.  Follow your health care provider's instructions about healthy diet, exercising, and getting tested or screened for diseases.  Follow your health care provider's instructions on monitoring your cholesterol and blood pressure. This information is not intended to replace advice given to you by your health care provider. Make sure you discuss any questions you have with your health care provider. Document Released: 06/13/2011 Document Revised: 11/21/2018 Document Reviewed: 11/21/2018 Elsevier Patient Education  2020 [employer].

## 2019-07-29 ENCOUNTER — Other Ambulatory Visit: Payer: Self-pay

## 2019-07-29 ENCOUNTER — Ambulatory Visit
Admission: RE | Admit: 2019-07-29 | Discharge: 2019-07-29 | Disposition: A | Discharge status: Home / Self Care | Payer: BC Managed Care – PPO | Source: Ambulatory Visit | Attending: Internal Medicine | Admitting: Internal Medicine

## 2019-07-29 DIAGNOSIS — Z1231 Encounter for screening mammogram for malignant neoplasm of breast: Secondary | ICD-10-CM

## 2019-07-30 ENCOUNTER — Other Ambulatory Visit: Payer: Self-pay | Admitting: Internal Medicine

## 2019-07-30 DIAGNOSIS — R928 Other abnormal and inconclusive findings on diagnostic imaging of breast: Secondary | ICD-10-CM

## 2019-08-06 ENCOUNTER — Other Ambulatory Visit: Payer: Self-pay

## 2019-08-06 ENCOUNTER — Ambulatory Visit: Payer: BC Managed Care – PPO

## 2019-08-06 ENCOUNTER — Ambulatory Visit
Admission: RE | Admit: 2019-08-06 | Discharge: 2019-08-06 | Disposition: A | Discharge status: Home / Self Care | Payer: BC Managed Care – PPO | Source: Ambulatory Visit | Attending: Internal Medicine | Admitting: Internal Medicine

## 2019-08-06 DIAGNOSIS — R928 Other abnormal and inconclusive findings on diagnostic imaging of breast: Secondary | ICD-10-CM

## 2019-08-29 ENCOUNTER — Other Ambulatory Visit: Payer: Self-pay | Admitting: Internal Medicine

## 2019-09-03 DIAGNOSIS — J3089 Other allergic rhinitis: Secondary | ICD-10-CM | POA: Diagnosis not present

## 2019-09-14 ENCOUNTER — Other Ambulatory Visit: Payer: Self-pay

## 2019-09-14 ENCOUNTER — Ambulatory Visit (INDEPENDENT_AMBULATORY_CARE_PROVIDER_SITE_OTHER): Payer: BC Managed Care – PPO

## 2019-09-14 DIAGNOSIS — Z23 Encounter for immunization: Secondary | ICD-10-CM | POA: Diagnosis not present

## 2019-11-11 ENCOUNTER — Other Ambulatory Visit: Payer: Self-pay | Admitting: Internal Medicine

## 2019-11-27 ENCOUNTER — Other Ambulatory Visit: Payer: Self-pay | Admitting: Internal Medicine

## 2019-12-30 DIAGNOSIS — E119 Type 2 diabetes mellitus without complications: Secondary | ICD-10-CM | POA: Diagnosis not present

## 2020-01-16 ENCOUNTER — Other Ambulatory Visit: Payer: Self-pay | Admitting: *Deleted

## 2020-01-16 DIAGNOSIS — Z800 Family history of malignant neoplasm of digestive organs: Secondary | ICD-10-CM

## 2020-01-16 DIAGNOSIS — Z1501 Genetic susceptibility to malignant neoplasm of breast: Secondary | ICD-10-CM

## 2020-01-16 DIAGNOSIS — Z1509 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: Secondary | ICD-10-CM

## 2020-01-16 NOTE — Telephone Encounter (Signed)
-----   Message from Larina Bras, [place] sent at 01/17/2019  4:51 PM EST ----- Pt will need mri around 01/2020 ----- Message ----- From: Jerene Bears, MD Sent: 01/17/2019   4:33 PM EST To: Milus Banister, MD, Larina Bras, CMA  Thanks Trula Slade Please recall for MRI pancreas protocol in 1 year  JMP ----- Message ----- From: Milus Banister, MD Sent: 01/17/2019   8:24 AM EST To: Jerene Bears, MD  Ulice Dash,  Just completed colonoscopy and EUS. See full reports in Epic.    1 subCM polyp  Normal appearing pancreas     She will need MRI of pancreas in 1 year for elevated risk pancreatic cancer screening.   Thanks

## 2020-01-16 NOTE — Telephone Encounter (Signed)
Dr Hilarie Fredrickson, I was going to go ahead and order MRI abdomen with pancreatic protocol for elevated pancreatic cancer screening on this patient as suggested last year by Dr Ardis Hughs, however, I notice that she has a "contrast media" or "iodinated diagnostic agent" allergy with IV constrast causing fever and "itchy mouth." I know MRI contrast is typically different that CT contrast but wanted to run this by you first just in case you needed to premedicate her or in case you wanted to somehow go another route in screening.Marland KitchenMarland KitchenMarland Kitchen

## 2020-01-20 NOTE — Telephone Encounter (Signed)
MRI contrast should be different from a CT contrast allergy related perspective, but please make radiology aware. Okay to proceed

## 2020-01-22 MED ORDER — LORAZEPAM 1 MG PO TABS: 1.0000 mg | ORAL_TABLET | ORAL | 0 refills | Status: DC

## 2020-01-22 NOTE — Telephone Encounter (Signed)
Lorazepam 1 mg PO 1 hour before MRI She will need driver to and from appt as you stated

## 2020-01-22 NOTE — Telephone Encounter (Signed)
Patient has been scheduled for MRI abdomen with pancreatic protocol at [facility] on Wednesday, 02/05/20 at 8 am with 730am arrival. NPO midnight. She also needs labwork completed at [facility] lab a couple days prior to this appointment.   I have left a message for patient to call back.

## 2020-01-22 NOTE — Telephone Encounter (Signed)
Medication order created and awaiting your fingerprint signature to send to pharmacy.

## 2020-01-22 NOTE — Telephone Encounter (Signed)
Patient has been made aware of appointment time/date/location and prep for upcoming MRI and labs. Patient does note that she is actually claustraphobic (more recent development for her). Can we give her something to take prior to her appointment? She currently does not have anything... Patient has already been made aware that she would need to have someone to bring her and take her home from appointment if we do give her anything as we cannot let her drive.

## 2020-01-28 ENCOUNTER — Other Ambulatory Visit (INDEPENDENT_AMBULATORY_CARE_PROVIDER_SITE_OTHER): Payer: BC Managed Care – PPO

## 2020-01-28 DIAGNOSIS — Z1509 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: Secondary | ICD-10-CM

## 2020-01-28 DIAGNOSIS — Z85828 Personal history of other malignant neoplasm of skin: Secondary | ICD-10-CM | POA: Diagnosis not present

## 2020-01-28 DIAGNOSIS — Z1501 Genetic susceptibility to malignant neoplasm of breast: Secondary | ICD-10-CM | POA: Diagnosis not present

## 2020-01-28 DIAGNOSIS — L821 Other seborrheic keratosis: Secondary | ICD-10-CM | POA: Diagnosis not present

## 2020-01-28 DIAGNOSIS — L814 Other melanin hyperpigmentation: Secondary | ICD-10-CM | POA: Diagnosis not present

## 2020-01-28 DIAGNOSIS — D225 Melanocytic nevi of trunk: Secondary | ICD-10-CM | POA: Diagnosis not present

## 2020-01-28 DIAGNOSIS — Z800 Family history of malignant neoplasm of digestive organs: Secondary | ICD-10-CM

## 2020-01-28 DIAGNOSIS — L820 Inflamed seborrheic keratosis: Secondary | ICD-10-CM | POA: Diagnosis not present

## 2020-01-28 LAB — CREATININE, SERUM: Creatinine, Ser: 0.54 mg/dL (ref 0.40–1.20)

## 2020-01-28 LAB — BUN: BUN: 16 mg/dL (ref 6–23)

## 2020-02-05 ENCOUNTER — Ambulatory Visit (HOSPITAL_COMMUNITY)
Admission: RE | Admit: 2020-02-05 | Discharge: 2020-02-05 | Disposition: A | Discharge status: Home / Self Care | Payer: BC Managed Care – PPO | Source: Ambulatory Visit | Attending: Internal Medicine | Admitting: Internal Medicine

## 2020-02-05 ENCOUNTER — Other Ambulatory Visit: Payer: Self-pay

## 2020-02-05 ENCOUNTER — Other Ambulatory Visit: Payer: Self-pay | Admitting: Internal Medicine

## 2020-02-05 DIAGNOSIS — Z1509 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: Secondary | ICD-10-CM

## 2020-02-05 DIAGNOSIS — Z800 Family history of malignant neoplasm of digestive organs: Secondary | ICD-10-CM | POA: Diagnosis not present

## 2020-02-05 DIAGNOSIS — Z1501 Genetic susceptibility to malignant neoplasm of breast: Secondary | ICD-10-CM

## 2020-02-05 DIAGNOSIS — R935 Abnormal findings on diagnostic imaging of other abdominal regions, including retroperitoneum: Secondary | ICD-10-CM | POA: Diagnosis not present

## 2020-02-05 MED ORDER — GADOBUTROL 1 MMOL/ML IV SOLN
9.0000 mL | Freq: Once | INTRAVENOUS | Status: AC | PRN
  Administered 2020-02-05: 9 mL via INTRAVENOUS

## 2020-02-17 DIAGNOSIS — Z23 Encounter for immunization: Secondary | ICD-10-CM | POA: Diagnosis not present

## 2020-03-09 ENCOUNTER — Other Ambulatory Visit: Payer: Self-pay | Admitting: Internal Medicine

## 2020-03-13 ENCOUNTER — Other Ambulatory Visit: Payer: Self-pay | Admitting: Internal Medicine

## 2020-03-17 DIAGNOSIS — Z23 Encounter for immunization: Secondary | ICD-10-CM | POA: Diagnosis not present

## 2020-04-25 ENCOUNTER — Other Ambulatory Visit: Payer: Self-pay | Admitting: Internal Medicine

## 2020-07-27 ENCOUNTER — Other Ambulatory Visit: Payer: Self-pay | Admitting: Internal Medicine

## 2020-08-18 NOTE — Progress Notes (Signed)
Chief Complaint  Patient presents with  . Annual Exam    Doing okay    HPI: Patient  Nicole Gonzalez  49 y.o. comes in today for [place] visit and Chronic disease management   Under surveillance  pancreatica cnacer risk  And  Derm .  Mammogram.  due  Bp doing ol   Burning  Base of head  When exercise outside not inside no radiation and no ha assoc sx not sure   BG on metformin 1500 - per day  Feels  Not that good recnetly   Eating more 159 in am  120 in afternoons   Getting back on diet change  Had been down to 180 range   Eye doc on vits   Check  Break in skin righ t intertrigo   Allergy stable   Had moderna  vaccine     Health Maintenance  Topic Date Due  . Hepatitis C Screening  Never done  . HIV Screening  Never done  . PAP SMEAR-Modifier  03/26/2016  . HEMOGLOBIN A1C  12/11/2019  . OPHTHALMOLOGY EXAM  01/02/2020  . INFLUENZA VACCINE  07/12/2020  . MAMMOGRAM  07/28/2021  . FOOT EXAM  08/19/2021  . COLONOSCOPY  01/18/2024  . TETANUS/TDAP  07/18/2029  . PNEUMOCOCCAL POLYSACCHARIDE VACCINE AGE 25-64 HIGH RISK  Completed  . COVID-19 Vaccine  Completed   Health Maintenance Review LIFESTYLE:  Exercise:   recumbant bike   Was at 180 in past  Tobacco/ETS: Alcohol: n Sugar beverages:n Sleep: ave 6-7  Drug use: no HH of  3 Work: 40  Back to site    ROS:  GEN/ HEENT: No fever, significant weight changes sweats headaches vision problems hearing changes, CV/ PULM; No chest pain shortness of breath cough, syncope,edema  change in exercise tolerance. GI /GU: No adominal pain, vomiting, change in bowel habits. No blood in the stool. No significant GU symptoms. SKIN/HEME: ,no acute skin rashes suspicious lesions or bleeding. No lymphadenopathy, nodules, masses.  NEURO/ PSYCH:  No neurologic signs such as weakness numbness. No depression anxiety. IMM/ Allergy: No unusual infections.  Allergy .   REST of 12 system review negative except as per  HPI   Past Medical History:  Diagnosis Date  . Anemia     better after hysterectomy  . Cancer (HCC)    basil cell carcinoma/forehead  . Diabetes mellitus without complication ([place])   . DIABETES MELLITUS, GESTATIONAL, HX OF 06/02/2009   Qualifier: Diagnosis of  By: Regis Bill MD, Standley Brooking   . Diverticulosis   . Family history of breast cancer   . Family history of genetic disease carrier    father has CDKN2A and ATM mutation  . Family history of melanoma   . Family history of pancreatic cancer   . Fracture of lower leg     fall 2011  . GERD (gastroesophageal reflux disease)   . History of basal cell carcinoma excision   . History of gestational diabetes    x2  . HTN (hypertension)   . Hyperlipidemia    [id number] trx   . Increased frequency of headaches 03/15/2012  . Internal hemorrhoids   . Obesity   . Seizures ([place])    as an infant d/t fever  . Tubular adenoma of colon   . UNSPECIFIED TACHYCARDIA 09/01/2010   Qualifier: Diagnosis of  By: Charlett Blake MD, Erline Levine      Past Surgical History:  Procedure Laterality Date  . ABDOMINAL HYSTERECTOMY  06/29/2010  question had abnormal cells on Path, fibroids  . BASAL CELL CARCINOMA EXCISION    . BREAST BIOPSY Right 2009  . COLONOSCOPY WITH PROPOFOL N/A 01/17/2019   Procedure: COLONOSCOPY WITH PROPOFOL;  Surgeon: Jacobs, Daniel P, MD;  Location: [facility];  Service: Endoscopy;  Laterality: N/A;  . ESOPHAGOGASTRODUODENOSCOPY (EGD) WITH PROPOFOL N/A 01/17/2019   Procedure: ESOPHAGOGASTRODUODENOSCOPY (EGD) WITH PROPOFOL;  Surgeon: Jacobs, Daniel P, MD;  Location: [facility];  Service: Endoscopy;  Laterality: N/A;  . EUS N/A 01/17/2019   Procedure: UPPER ENDOSCOPIC ULTRASOUND (EUS) RADIAL;  Surgeon: Jacobs, Daniel P, MD;  Location: [facility];  Service: Endoscopy;  Laterality: N/A;  . POLYPECTOMY  01/17/2019   Procedure: POLYPECTOMY;  Surgeon: Jacobs, Daniel P, MD;  Location: [facility];  Service: Endoscopy;;  . TONSILLECTOMY      Family  History  Problem Relation Age of Onset  . Melanoma Father        x 3 times, first in 40's  . Hypertension Father   . Hyperlipidemia Father   . Squamous cell carcinoma Father   . Colon polyps Father   . Heart disease Father   . Pancreatic cancer Father 74       CDKN2A muation  . Diabetes Maternal Grandmother   . Hyperlipidemia Maternal Grandmother   . Heart disease Maternal Grandmother   . Arthritis Brother         Psoriatic  . Pancreatic cancer Paternal Grandmother 55       met liver  . Heart disease Paternal Grandfather   . Breast cancer Brother 48       ATM + mutation? or VUS  . Skin cancer Maternal Grandfather   . Breast cancer Other   . Skin cancer Cousin     Social History   Socioeconomic History  . Marital status: Married    Spouse name: Not on file  . Number of children: 2  . Years of education: Not on file  . Highest education level: Not on file  Occupational History  . Occupation: insurance adjuster    Employer: [employer]  Tobacco Use  . Smoking status: Never Smoker  . Smokeless tobacco: Never Used  Vaping Use  . Vaping Use: Never used  Substance and Sexual Activity  . Alcohol use: No  . Drug use: No  . Sexual activity: Not on file  Other Topics Concern  . Not on file  Social History Narrative    Household of 4 -2 children   Son   to college     no pets     Insurance adjuster examiner  Works  40 +     Graduated [employer]    Nonsmoker married   Social Determinants of Health   Financial Resource Strain:   . Difficulty of Paying Living Expenses: Not on file  Food Insecurity:   . Worried About Running Out of Food in the Last Year: Not on file  . Ran Out of Food in the Last Year: Not on file  Transportation Needs:   . Lack of Transportation (Medical): Not on file  . Lack of Transportation (Non-Medical): Not on file  Physical Activity:   . Days of Exercise per Week: Not on file  . Minutes of Exercise per Session: Not on file  Stress:    . Feeling of Stress : Not on file  Social Connections:   . Frequency of Communication with Friends and Family: Not on file  . Frequency of Social Gatherings with   Friends and Family: Not on file  . Attends Religious Services: Not on file  . Active Member of Clubs or Organizations: Not on file  . Attends Club or Organization Meetings: Not on file  . Marital Status: Not on file    Outpatient Medications Prior to Visit  Medication Sig Dispense Refill  . BAYER CONTOUR NEXT TEST test strip Use as instructed 100 each 11  . fexofenadine (ALLEGRA) 180 MG tablet Take 180 mg by mouth daily as needed (allergies.). In the morning     . glucose blood (BAYER CONTOUR TEST) test strip Next strips Test 1-2 times daily Dx E11.9 100 each 12  . glucose blood test strip Use as instructed 100 each 12  . ibuprofen (ADVIL,MOTRIN) 200 MG tablet Take 600 mg by mouth every 8 (eight) hours as needed (pain).    . Lancets MISC Use Bayer Contour Next One lancets to test blood sugar 1-2 times daily 100 each 12  . levocetirizine (XYZAL) 5 MG tablet Take 5 mg by mouth every evening.     . lisinopril (ZESTRIL) 10 MG tablet Take 1 tablet (10 mg total) by mouth daily. for high blood pressure. Patient needs an appointment for refills. 90 tablet 0  . LORazepam (ATIVAN) 1 MG tablet Take 1 tablet (1 mg total) by mouth as directed. 1 hour prior to your MRI appointment. DO NOT drive while taking this medication 1 tablet 0  . metFORMIN (GLUCOPHAGE-XR) 500 MG 24 hr tablet TAKE 3 TABLETS BY MOUTH EVERY DAY WITH BREAKFAST 270 tablet 0  . montelukast (SINGULAIR) 10 MG tablet Take 10 mg by mouth every evening.     . Multiple Vitamin (MULTIVITAMIN WITH MINERALS) TABS tablet Take 1 tablet by mouth daily.    . omeprazole (PRILOSEC) 20 MG capsule Take 20 mg by mouth every evening.     . QNASL 80 MCG/ACT AERS Place 2 sprays into the nose every evening.     . simvastatin (ZOCOR) 40 MG tablet Take 1 tablet (40 mg total) by mouth at bedtime.  Patient needs an appointment for refills. 90 tablet 0   No facility-administered medications prior to visit.     EXAM:  BP 126/74   Pulse 96   Temp 98.6 F (37 C) (Oral)   Ht 5' 5.1" (1.654 m)   Wt 202 lb 3.2 oz (91.7 kg)   SpO2 98%   BMI 33.54 kg/m   Body mass index is 33.54 kg/m. Wt Readings from Last 3 Encounters:  08/19/20 202 lb 3.2 oz (91.7 kg)  07/19/19 195 lb (88.5 kg)  01/17/19 204 lb 4.8 oz (92.7 kg)    Physical Exam: Vital signs reviewed GEN:This is a well-developed well-nourished alert cooperative    who appearsr stated age in no acute distress.  HEENT: normocephalic atraumatic , Eyes: PERRL EOM's full, conjunctiva clear, Nares: paten,t no deformity discharge or tenderness., Ears: no deformity EAC's clear TMs with normal landmarks. Mouth: masked NECK: supple without masses, thyromegaly or bruits. CHEST/PULM:  Clear to auscultation and percussion breath sounds equal no wheeze , rales or rhonchi. No chest wall deformities or tenderness. CV: PMI is nondisplaced, S1 S2 no gallops, murmurs, rubs. Peripheral pulses are full without delay.No JVD .  ABDOMEN: Bowel sounds normal nontender  No guard or rebound, no hepato splenomegal no CVA tenderness.  No hernia. Extremtities:  No clubbing cyanosis or edema, no acute joint swelling or redness no focal atrophy NEURO:  Oriented x3, cranial nerves 3-12 appear to be intact, no obvious   focal weakness,gait within normal limits no abnormal reflexes or asymmetrical SKIN: No acute rashes normal turgor, color, no bruising or petechiae. Right lower bad creas with 1+ cm crack in skin open no weeping  No abscess  PSYCH: Oriented, good eye contact, no obvious depression anxiety, cognition and judgment appear normal. LN: no cervical axillary inguinal adenopathy Diabetic Foot Exam - Simple   Simple Foot Form Diabetic Foot exam was performed with the following findings: Yes 08/19/2020  2:28 PM  Visual Inspection No deformities, no  ulcerations, no other skin breakdown bilaterally: Yes Sensation Testing Intact to touch and monofilament testing bilaterally: Yes Pulse Check Posterior Tibialis and Dorsalis pulse intact bilaterally: Yes Comments     Lab Results  Component Value Date   WBC 7.8 06/11/2019   HGB 13.8 06/11/2019   HCT 41.7 06/11/2019   PLT 234.0 06/11/2019   GLUCOSE 125 (H) 06/11/2019   CHOL 141 06/11/2019   TRIG 53.0 06/11/2019   HDL 44.90 06/11/2019   LDLDIRECT 172.4 10/02/2007   LDLCALC 86 06/11/2019   ALT 15 06/11/2019   AST 11 06/11/2019   NA 138 06/11/2019   K 4.4 06/11/2019   CL 102 06/11/2019   CREATININE 0.54 01/28/2020   BUN 16 01/28/2020   CO2 27 06/11/2019   TSH 1.82 06/11/2019   HGBA1C 6.2 06/11/2019   MICROALBUR 0.8 06/11/2019    BP Readings from Last 3 Encounters:  08/19/20 126/74  07/19/19 126/68  01/17/19 122/74    Lab planeviewed with patient   ASSESSMENT AND PLAN:  Discussed the following assessment and plan:    ICD-10-CM   1. Visit for preventive health examination  Z00.00 CBC with Differential/Platelet    Hemoglobin A1c    Hepatic function panel    Lipid panel    Microalbumin / creatinine urine ratio    TSH    BASIC METABOLIC PANEL WITH GFR    Vitamin B12  2. Medication management  Z79.899 CBC with Differential/Platelet    Hemoglobin A1c    Hepatic function panel    Lipid panel    Microalbumin / creatinine urine ratio    TSH    BASIC METABOLIC PANEL WITH GFR    Vitamin B12  3. Essential hypertension  I10 CBC with Differential/Platelet    Hemoglobin A1c    Hepatic function panel    Lipid panel    Microalbumin / creatinine urine ratio    TSH    BASIC METABOLIC PANEL WITH GFR    Vitamin B12  4. Hyperlipidemia, unspecified hyperlipidemia type  E78.5 CBC with Differential/Platelet    Hemoglobin A1c    Hepatic function panel    Lipid panel    Microalbumin / creatinine urine ratio    TSH    BASIC METABOLIC PANEL WITH GFR    Vitamin B12  5. Type  2 diabetes mellitus with hyperglycemia, without long-term current use of insulin (HCC)  E11.65 CBC with Differential/Platelet    Hemoglobin A1c    Hepatic function panel    Lipid panel    Microalbumin / creatinine urine ratio    TSH    BASIC METABOLIC PANEL WITH GFR    Vitamin B12  6. Monoallelic mutation of CDKN2A gene  Z15.09 CBC with Differential/Platelet   Z15.01 Hemoglobin A1c    Hepatic function panel    Lipid panel    Microalbumin / creatinine urine ratio    TSH    BASIC METABOLIC PANEL WITH GFR    Vitamin B12  7. Skin breakdown    R23.8   labs due plan fasting  Local care wound. Not sure what burning back of head    Will follow  Intensify lifestyle interventions. consdier inc metformin to 2 gm per day  hcm reviewed will get flu vaccine in fall  Return in about 6 months (around 02/16/2021) for depending on results.  Patient Care Team: Sharlena Kristensen, Standley Brooking, MD as PCP - General Gwendolyn Fill, MD as Attending Physician (Obstetrics and Gynecology) Tiajuana Amass, MD (Allergy) [place], Jennefer Bravo, MD (Dermatology) Pyrtle, Lajuan Lines, MD as Consulting Physician (Gastroenterology) Patient Instructions  Fasting lab appt . consider increase to metformn. If needed   Flu vaccine  This fall   Health Maintenance, Female Adopting a healthy lifestyle and getting preventive care are important in promoting health and wellness. Ask your health care provider about:  The right schedule for you to have regular tests and exams.  Things you can do on your own to prevent diseases and keep yourself healthy. What should I know about diet, weight, and exercise? Eat a healthy diet   Eat a diet that includes plenty of vegetables, fruits, low-fat dairy products, and lean protein.  Do not eat a lot of foods that are high in solid fats, added sugars, or sodium. Maintain a healthy weight Body mass index (BMI) is used to identify weight problems. It estimates body fat based on height and weight.  Your health care provider can help determine your BMI and help you achieve or maintain a healthy weight. Get regular exercise Get regular exercise. This is one of the most important things you can do for your health. Most adults should:  Exercise for at least 150 minutes each week. The exercise should increase your heart rate and make you sweat (moderate-intensity exercise).  Do strengthening exercises at least twice a week. This is in addition to the moderate-intensity exercise.  Spend less time sitting. Even light physical activity can be beneficial. Watch cholesterol and blood lipids Have your blood tested for lipids and cholesterol at 50 years of age, then have this test every 5 years. Have your cholesterol levels checked more often if:  Your lipid or cholesterol levels are high.  You are older than 50 years of age.  You are at high risk for heart disease. What should I know about cancer screening? Depending on your health history and family history, you may need to have cancer screening at various ages. This may include screening for:  Breast cancer.  Cervical cancer.  Colorectal cancer.  Skin cancer.  Lung cancer. What should I know about heart disease, diabetes, and high blood pressure? Blood pressure and heart disease  High blood pressure causes heart disease and increases the risk of stroke. This is more likely to develop in people who have high blood pressure readings, are of African descent, or are overweight.  Have your blood pressure checked: ? Every 3-5 years if you are 40-58 years of age. ? Every year if you are 83 years old or older. Diabetes Have regular diabetes screenings. This checks your fasting blood sugar level. Have the screening done:  Once every three years after age 44 if you are at a normal weight and have a low risk for diabetes.  More often and at a younger age if you are overweight or have a high risk for diabetes. What should I know about  preventing infection? Hepatitis B If you have a higher risk for hepatitis B, you should be screened for this virus. Talk with  your health care provider to find out if you are at risk for hepatitis B infection. Hepatitis C Testing is recommended for:  Everyone born from 1945 through 1965.  Anyone with known risk factors for hepatitis C. Sexually transmitted infections (STIs)  Get screened for STIs, including gonorrhea and chlamydia, if: ? You are sexually active and are younger than 50 years of age. ? You are older than 50 years of age and your health care provider tells you that you are at risk for this type of infection. ? Your sexual activity has changed since you were last screened, and you are at increased risk for chlamydia or gonorrhea. Ask your health care provider if you are at risk.  Ask your health care provider about whether you are at high risk for HIV. Your health care provider may recommend a prescription medicine to help prevent HIV infection. If you choose to take medicine to prevent HIV, you should first get tested for HIV. You should then be tested every 3 months for as long as you are taking the medicine. Pregnancy  If you are about to stop having your period (premenopausal) and you may become pregnant, seek counseling before you get pregnant.  Take 400 to 800 micrograms (mcg) of folic acid every day if you become pregnant.  Ask for birth control (contraception) if you want to prevent pregnancy. Osteoporosis and menopause Osteoporosis is a disease in which the bones lose minerals and strength with aging. This can result in bone fractures. If you are 65 years old or older, or if you are at risk for osteoporosis and fractures, ask your health care provider if you should:  Be screened for bone loss.  Take a calcium or vitamin D supplement to lower your risk of fractures.  Be given hormone replacement therapy (HRT) to treat symptoms of menopause. Follow these  instructions at home: Lifestyle  Do not use any products that contain nicotine or tobacco, such as cigarettes, e-cigarettes, and chewing tobacco. If you need help quitting, ask your health care provider.  Do not use street drugs.  Do not share needles.  Ask your health care provider for help if you need support or information about quitting drugs. Alcohol use  Do not drink alcohol if: ? Your health care provider tells you not to drink. ? You are pregnant, may be pregnant, or are planning to become pregnant.  If you drink alcohol: ? Limit how much you use to 0-1 drink a day. ? Limit intake if you are breastfeeding.  Be aware of how much alcohol is in your drink. In the [place] one drink equals one 12 oz bottle of beer (355 mL), one 5 oz glass of wine (148 mL), or one 1 oz glass of hard liquor (44 mL). General instructions  Schedule regular health, dental, and eye exams.  Stay current with your vaccines.  Tell your health care provider if: ? You often feel depressed. ? You have ever been abused or do not feel safe at home. Summary  Adopting a healthy lifestyle and getting preventive care are important in promoting health and wellness.  Follow your health care provider's instructions about healthy diet, exercising, and getting tested or screened for diseases.  Follow your health care provider's instructions on monitoring your cholesterol and blood pressure. This information is not intended to replace advice given to you by your health care provider. Make sure you discuss any questions you have with your health care provider. Document Revised: 11/21/2018 Document Reviewed:   11/21/2018 Elsevier Patient Education  2020 Elsevier Inc.       Wanda K. Panosh M.D.  

## 2020-08-19 ENCOUNTER — Encounter: Payer: Self-pay | Admitting: Internal Medicine

## 2020-08-19 ENCOUNTER — Ambulatory Visit (INDEPENDENT_AMBULATORY_CARE_PROVIDER_SITE_OTHER): Payer: BC Managed Care – PPO | Admitting: Internal Medicine

## 2020-08-19 ENCOUNTER — Other Ambulatory Visit: Payer: Self-pay

## 2020-08-19 VITALS — BP 126/74 | HR 96 | Temp 98.6°F | Ht 65.1 in | Wt 202.2 lb

## 2020-08-19 DIAGNOSIS — R238 Other skin changes: Secondary | ICD-10-CM | POA: Diagnosis not present

## 2020-08-19 DIAGNOSIS — E1165 Type 2 diabetes mellitus with hyperglycemia: Secondary | ICD-10-CM | POA: Diagnosis not present

## 2020-08-19 DIAGNOSIS — Z79899 Other long term (current) drug therapy: Secondary | ICD-10-CM | POA: Diagnosis not present

## 2020-08-19 DIAGNOSIS — I10 Essential (primary) hypertension: Secondary | ICD-10-CM

## 2020-08-19 DIAGNOSIS — Z0001 Encounter for general adult medical examination with abnormal findings: Secondary | ICD-10-CM

## 2020-08-19 DIAGNOSIS — E785 Hyperlipidemia, unspecified: Secondary | ICD-10-CM

## 2020-08-19 DIAGNOSIS — Z0000 Encounter for general adult medical examination without abnormal findings: Secondary | ICD-10-CM

## 2020-08-19 DIAGNOSIS — Z1509 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: Secondary | ICD-10-CM

## 2020-08-19 DIAGNOSIS — Z1501 Genetic susceptibility to malignant neoplasm of breast: Secondary | ICD-10-CM

## 2020-08-19 NOTE — Patient Instructions (Addendum)
Fasting lab appt . consider increase to metformn. If needed   Flu vaccine  This fall   Health Maintenance, Female Adopting a healthy lifestyle and getting preventive care are important in promoting health and wellness. Ask your health care provider about:  The right schedule for you to have regular tests and exams.  Things you can do on your own to prevent diseases and keep yourself healthy. What should I know about diet, weight, and exercise? Eat a healthy diet   Eat a diet that includes plenty of vegetables, fruits, low-fat dairy products, and lean protein.  Do not eat a lot of foods that are high in solid fats, added sugars, or sodium. Maintain a healthy weight Body mass index (BMI) is used to identify weight problems. It estimates body fat based on height and weight. Your health care provider can help determine your BMI and help you achieve or maintain a healthy weight. Get regular exercise Get regular exercise. This is one of the most important things you can do for your health. Most adults should:  Exercise for at least 150 minutes each week. The exercise should increase your heart rate and make you sweat (moderate-intensity exercise).  Do strengthening exercises at least twice a week. This is in addition to the moderate-intensity exercise.  Spend less time sitting. Even light physical activity can be beneficial. Watch cholesterol and blood lipids Have your blood tested for lipids and cholesterol at 50 years of age, then have this test every 5 years. Have your cholesterol levels checked more often if:  Your lipid or cholesterol levels are high.  You are older than 50 years of age.  You are at high risk for heart disease. What should I know about cancer screening? Depending on your health history and family history, you may need to have cancer screening at various ages. This may include screening for:  Breast cancer.  Cervical cancer.  Colorectal cancer.  Skin  cancer.  Lung cancer. What should I know about heart disease, diabetes, and high blood pressure? Blood pressure and heart disease  High blood pressure causes heart disease and increases the risk of stroke. This is more likely to develop in people who have high blood pressure readings, are of African descent, or are overweight.  Have your blood pressure checked: ? Every 3-5 years if you are 50-50 years of age. ? Every year if you are 20 years old or older. Diabetes Have regular diabetes screenings. This checks your fasting blood sugar level. Have the screening done:  Once every three years after age 79 if you are at a normal weight and have a low risk for diabetes.  More often and at a younger age if you are overweight or have a high risk for diabetes. What should I know about preventing infection? Hepatitis B If you have a higher risk for hepatitis B, you should be screened for this virus. Talk with your health care provider to find out if you are at risk for hepatitis B infection. Hepatitis C Testing is recommended for:  Everyone born from [birth year] through 1966.  Anyone with known risk factors for hepatitis C. Sexually transmitted infections (STIs)  Get screened for STIs, including gonorrhea and chlamydia, if: ? You are sexually active and are younger than 50 years of age. ? You are older than 50 years of age and your health care provider tells you that you are at risk for this type of infection. ? Your sexual activity has changed since you  were last screened, and you are at increased risk for chlamydia or gonorrhea. Ask your health care provider if you are at risk.  Ask your health care provider about whether you are at high risk for HIV. Your health care provider may recommend a prescription medicine to help prevent HIV infection. If you choose to take medicine to prevent HIV, you should first get tested for HIV. You should then be tested every 3 months for as long as you are taking  the medicine. Pregnancy  If you are about to stop having your period (premenopausal) and you may become pregnant, seek counseling before you get pregnant.  Take 400 to 800 micrograms (mcg) of folic acid every day if you become pregnant.  Ask for birth control (contraception) if you want to prevent pregnancy. Osteoporosis and menopause Osteoporosis is a disease in which the bones lose minerals and strength with aging. This can result in bone fractures. If you are 50 years of age or older, or if you are at risk for osteoporosis and fractures, ask your health care provider if you should:  Be screened for bone loss.  Take a calcium or vitamin D supplement to lower your risk of fractures.  Be given hormone replacement therapy (HRT) to treat symptoms of menopause. Follow these instructions at home: Lifestyle  Do not use any products that contain nicotine or tobacco, such as cigarettes, e-cigarettes, and chewing tobacco. If you need help quitting, ask your health care provider.  Do not use street drugs.  Do not share needles.  Ask your health care provider for help if you need support or information about quitting drugs. Alcohol use  Do not drink alcohol if: ? Your health care provider tells you not to drink. ? You are pregnant, may be pregnant, or are planning to become pregnant.  If you drink alcohol: ? Limit how much you use to 0-1 drink a day. ? Limit intake if you are breastfeeding.  Be aware of how much alcohol is in your drink. In the [place] one drink equals one 12 oz bottle of beer (355 mL), one 5 oz glass of wine (148 mL), or one 1 oz glass of hard liquor (44 mL). General instructions  Schedule regular health, dental, and eye exams.  Stay current with your vaccines.  Tell your health care provider if: ? You often feel depressed. ? You have ever been abused or do not feel safe at home. Summary  Adopting a healthy lifestyle and getting preventive care are important in  promoting health and wellness.  Follow your health care provider's instructions about healthy diet, exercising, and getting tested or screened for diseases.  Follow your health care provider's instructions on monitoring your cholesterol and blood pressure. This information is not intended to replace advice given to you by your health care provider. Make sure you discuss any questions you have with your health care provider. Document Revised: 11/21/2018 Document Reviewed: 11/21/2018 Elsevier Patient Education  2020 [employer].

## 2020-08-20 ENCOUNTER — Other Ambulatory Visit: Payer: Self-pay | Admitting: Internal Medicine

## 2020-08-20 DIAGNOSIS — Z1231 Encounter for screening mammogram for malignant neoplasm of breast: Secondary | ICD-10-CM

## 2020-08-26 ENCOUNTER — Other Ambulatory Visit (INDEPENDENT_AMBULATORY_CARE_PROVIDER_SITE_OTHER): Payer: BC Managed Care – PPO

## 2020-08-26 ENCOUNTER — Other Ambulatory Visit: Payer: Self-pay

## 2020-08-26 DIAGNOSIS — E1165 Type 2 diabetes mellitus with hyperglycemia: Secondary | ICD-10-CM | POA: Diagnosis not present

## 2020-08-26 DIAGNOSIS — Z79899 Other long term (current) drug therapy: Secondary | ICD-10-CM | POA: Diagnosis not present

## 2020-08-26 DIAGNOSIS — Z0000 Encounter for general adult medical examination without abnormal findings: Secondary | ICD-10-CM

## 2020-08-26 DIAGNOSIS — E785 Hyperlipidemia, unspecified: Secondary | ICD-10-CM

## 2020-08-26 DIAGNOSIS — I10 Essential (primary) hypertension: Secondary | ICD-10-CM

## 2020-08-26 DIAGNOSIS — Z1509 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: Secondary | ICD-10-CM

## 2020-08-26 DIAGNOSIS — Z1501 Genetic susceptibility to malignant neoplasm of breast: Secondary | ICD-10-CM

## 2020-08-27 LAB — CBC WITH DIFFERENTIAL/PLATELET
Absolute Monocytes: 611 cells/uL (ref 200–950)
Basophils Absolute: 43 cells/uL (ref 0–200)
Basophils Relative: 0.5 %
Eosinophils Absolute: 120 cells/uL (ref 15–500)
Eosinophils Relative: 1.4 %
HCT: 39.8 % (ref 35.0–45.0)
Hemoglobin: 13.5 g/dL (ref 11.7–15.5)
Lymphs Abs: 2073 cells/uL (ref 850–3900)
MCH: 29.9 pg (ref 27.0–33.0)
MCHC: 33.9 g/dL (ref 32.0–36.0)
MCV: 88.1 fL (ref 80.0–100.0)
MPV: 11.5 fL (ref 7.5–12.5)
Monocytes Relative: 7.1 %
Neutro Abs: 5753 cells/uL (ref 1500–7800)
Neutrophils Relative %: 66.9 %
Platelets: 247 10*3/uL (ref 140–400)
RBC: 4.52 10*6/uL (ref 3.80–5.10)
RDW: 11.8 % (ref 11.0–15.0)
Total Lymphocyte: 24.1 %
WBC: 8.6 10*3/uL (ref 3.8–10.8)

## 2020-08-27 LAB — MICROALBUMIN / CREATININE URINE RATIO
Creatinine, Urine: 135 mg/dL (ref 20–275)
Microalb Creat Ratio: 18 mcg/mg creat (ref <30)
Microalb, Ur: 2.4 mg/dL

## 2020-08-27 LAB — HEPATIC FUNCTION PANEL
AG Ratio: 2.3 (calc) (ref 1.0–2.5)
ALT: 17 U/L (ref 6–29)
AST: 11 U/L (ref 10–35)
Albumin: 4.4 g/dL (ref 3.6–5.1)
Alkaline phosphatase (APISO): 50 U/L (ref 37–153)
Bilirubin, Direct: 0.2 mg/dL (ref 0.0–0.2)
Globulin: 1.9 g/dL (calc) (ref 1.9–3.7)
Indirect Bilirubin: 0.6 mg/dL (calc) (ref 0.2–1.2)
Total Bilirubin: 0.8 mg/dL (ref 0.2–1.2)
Total Protein: 6.3 g/dL (ref 6.1–8.1)

## 2020-08-27 LAB — LIPID PANEL
Cholesterol: 145 mg/dL (ref <200)
HDL: 51 mg/dL (ref >=50)
LDL Cholesterol (Calc): 78 mg/dL (calc)
Non-HDL Cholesterol (Calc): 94 mg/dL (calc) (ref <130)
Total CHOL/HDL Ratio: 2.8 (calc) (ref <5.0)
Triglycerides: 78 mg/dL (ref <150)

## 2020-08-27 LAB — BASIC METABOLIC PANEL WITH GFR
BUN: 19 mg/dL (ref 7–25)
CO2: 25 mmol/L (ref 20–32)
Calcium: 9.3 mg/dL (ref 8.6–10.4)
Chloride: 103 mmol/L (ref 98–110)
Creat: 0.57 mg/dL (ref 0.50–1.05)
GFR, Est African American: 125 mL/min/{1.73_m2} (ref >=60)
GFR, Est Non African American: 108 mL/min/{1.73_m2} (ref >=60)
Glucose, Bld: 136 mg/dL — ABNORMAL HIGH (ref 65–99)
Potassium: 4.3 mmol/L (ref 3.5–5.3)
Sodium: 138 mmol/L (ref 135–146)

## 2020-08-27 LAB — HEMOGLOBIN A1C
Hgb A1c MFr Bld: 6.2 % of total Hgb — ABNORMAL HIGH (ref <5.7)
Mean Plasma Glucose: 131 (calc)
eAG (mmol/L): 7.3 (calc)

## 2020-08-27 LAB — VITAMIN B12: Vitamin B-12: 282 pg/mL (ref 200–1100)

## 2020-08-27 LAB — TSH: TSH: 3.1 mIU/L

## 2020-08-28 ENCOUNTER — Other Ambulatory Visit: Payer: Self-pay | Admitting: Internal Medicine

## 2020-08-28 NOTE — Telephone Encounter (Signed)
THe dissolvable  b12  1071mcg      Should be equivalent to the injections but if you want to try we can Do weekly b12 injections  For  6 weeks   To begin . Let us know   You may just not absorb   b12  As well

## 2020-09-01 ENCOUNTER — Ambulatory Visit: Payer: BC Managed Care – PPO

## 2020-09-03 ENCOUNTER — Other Ambulatory Visit: Payer: Self-pay | Admitting: Internal Medicine

## 2020-09-09 DIAGNOSIS — H8113 Benign paroxysmal vertigo, bilateral: Secondary | ICD-10-CM | POA: Diagnosis not present

## 2020-09-09 DIAGNOSIS — J3089 Other allergic rhinitis: Secondary | ICD-10-CM | POA: Diagnosis not present

## 2020-09-17 NOTE — Progress Notes (Signed)
So as you have seen  a1c is stable  in good  range . Please let us know  if you are  taking the b12   so we can  update the med list   Thanks

## 2020-10-22 ENCOUNTER — Ambulatory Visit
Admission: RE | Admit: 2020-10-22 | Discharge: 2020-10-22 | Disposition: A | Discharge status: Home / Self Care | Payer: BC Managed Care – PPO | Source: Ambulatory Visit | Attending: Internal Medicine | Admitting: Internal Medicine

## 2020-10-22 ENCOUNTER — Other Ambulatory Visit: Payer: Self-pay

## 2020-10-22 DIAGNOSIS — Z1231 Encounter for screening mammogram for malignant neoplasm of breast: Secondary | ICD-10-CM | POA: Diagnosis not present

## 2020-10-27 ENCOUNTER — Other Ambulatory Visit: Payer: Self-pay | Admitting: Internal Medicine

## 2020-12-03 ENCOUNTER — Other Ambulatory Visit: Payer: Self-pay | Admitting: Internal Medicine

## 2021-01-22 ENCOUNTER — Other Ambulatory Visit: Payer: Self-pay

## 2021-01-22 ENCOUNTER — Telehealth: Payer: Self-pay | Admitting: Internal Medicine

## 2021-01-22 ENCOUNTER — Other Ambulatory Visit: Payer: Self-pay | Admitting: Internal Medicine

## 2021-01-22 DIAGNOSIS — Z800 Family history of malignant neoplasm of digestive organs: Secondary | ICD-10-CM

## 2021-01-22 MED ORDER — LORAZEPAM 1 MG PO TABS: 1.0000 mg | ORAL_TABLET | ORAL | 0 refills | Status: DC

## 2021-01-22 NOTE — Telephone Encounter (Signed)
Pt refill for metformin was denied and pt wants to know why. She saw Dr. Regis Bill in September for her CPE. Please advise [phone].

## 2021-01-22 NOTE — Telephone Encounter (Signed)
Order in epic for MR/MRCP of abd for family hx of pancreatic cancer. Staff message sent to scheduling and script for ativan sent to pharmacy due to pts claustrophobia. Pt knows to expect a call from [phone].

## 2021-01-25 ENCOUNTER — Other Ambulatory Visit: Payer: Self-pay

## 2021-01-25 MED ORDER — METFORMIN HCL ER 500 MG PO TB24: ORAL_TABLET | ORAL | 1 refills | Status: DC

## 2021-01-27 DIAGNOSIS — E119 Type 2 diabetes mellitus without complications: Secondary | ICD-10-CM | POA: Diagnosis not present

## 2021-01-27 DIAGNOSIS — L814 Other melanin hyperpigmentation: Secondary | ICD-10-CM | POA: Diagnosis not present

## 2021-01-27 DIAGNOSIS — L821 Other seborrheic keratosis: Secondary | ICD-10-CM | POA: Diagnosis not present

## 2021-01-27 DIAGNOSIS — D225 Melanocytic nevi of trunk: Secondary | ICD-10-CM | POA: Diagnosis not present

## 2021-01-27 DIAGNOSIS — Z85828 Personal history of other malignant neoplasm of skin: Secondary | ICD-10-CM | POA: Diagnosis not present

## 2021-01-27 LAB — HM DIABETES EYE EXAM

## 2021-01-27 NOTE — Telephone Encounter (Signed)
Refill was sent to pharmacy on 01/25/21.  Patient aware

## 2021-02-10 ENCOUNTER — Other Ambulatory Visit: Payer: Self-pay | Admitting: Internal Medicine

## 2021-02-10 ENCOUNTER — Other Ambulatory Visit: Payer: Self-pay

## 2021-02-10 ENCOUNTER — Ambulatory Visit (HOSPITAL_COMMUNITY)
Admission: RE | Admit: 2021-02-10 | Discharge: 2021-02-10 | Disposition: A | Discharge status: Home / Self Care | Payer: BC Managed Care – PPO | Source: Ambulatory Visit | Attending: Internal Medicine | Admitting: Internal Medicine

## 2021-02-10 DIAGNOSIS — Z800 Family history of malignant neoplasm of digestive organs: Secondary | ICD-10-CM

## 2021-02-10 DIAGNOSIS — K760 Fatty (change of) liver, not elsewhere classified: Secondary | ICD-10-CM | POA: Diagnosis not present

## 2021-02-10 MED ORDER — GADOBUTROL 1 MMOL/ML IV SOLN
10.0000 mL | Freq: Once | INTRAVENOUS | Status: AC | PRN
  Administered 2021-02-10: 10 mL via INTRAVENOUS

## 2021-03-09 ENCOUNTER — Other Ambulatory Visit: Payer: Self-pay | Admitting: Internal Medicine

## 2021-06-07 ENCOUNTER — Other Ambulatory Visit: Payer: Self-pay | Admitting: Internal Medicine

## 2021-07-06 ENCOUNTER — Other Ambulatory Visit: Payer: Self-pay | Admitting: Internal Medicine

## 2021-09-05 ENCOUNTER — Other Ambulatory Visit: Payer: Self-pay | Admitting: Internal Medicine

## 2021-09-06 ENCOUNTER — Other Ambulatory Visit: Payer: Self-pay | Admitting: Internal Medicine

## 2021-09-07 ENCOUNTER — Other Ambulatory Visit: Payer: Self-pay

## 2021-09-07 NOTE — Progress Notes (Signed)
Chief Complaint  Patient presents with   Annual Exam     HPI: Patient  Nicole Gonzalez  50 y.o. comes in today for Preventive Health Care visit  and Chronic disease management  Has not seen her GYN recently Blood sugar diabetes has not been paying attention is most recently taking medication Has had some right breast itching with no rash and then has had a lump under her right arm off and on in the past but wonders if it is related.  Is due for mammogram in November already scheduled.  No discharge or rash. Flu shot today asked about other vaccines. Allergies she does see allergy specialist. Blood pressure has been controlled per her report On simvastatin will need refill 90 days. Right little toe nail is darkened and thickened fourth had been thickened does not remember trauma but possible no pain. No neuropathy or eye symptoms. Sleep has not been as good most recently did have hot flashes in the recent past but not now.   Health Maintenance  Topic Date Due   HIV Screening  Never done   Hepatitis C Screening  Never done   Zoster Vaccines- Shingrix (1 of 2) Never done   PAP SMEAR-Modifier  03/26/2016   COVID-19 Vaccine (3 - Moderna risk series) 04/14/2020   HEMOGLOBIN A1C  02/23/2021   FOOT EXAM  08/19/2021   OPHTHALMOLOGY EXAM  01/27/2022   MAMMOGRAM  10/22/2022   COLONOSCOPY (Pts 45-66yr Insurance coverage will need to be confirmed)  01/18/2024   TETANUS/TDAP  07/18/2029   INFLUENZA VACCINE  Completed   HPV VACCINES  Aged Out   Health Maintenance Review LIFESTYLE:  Exercise: Some Tobacco/ETS:n Alcohol: n Sugar beverages: n Sleep:problem 4-5  usually 6-7  Drug use: no HH of 3.5   no pets    ROS:  hot flushes  in past  and better .  REST of 12 system review negative except as per HPI   Past Medical History:  Diagnosis Date   Anemia     better after hysterectomy   [place]    basil cell carcinoma/forehead   Diabetes mellitus without complication [place]     DIABETES MELLITUS, GESTATIONAL, HX OF 06/02/2009   Qualifier: Diagnosis of  By: PRegis BillMD, WStandley Brooking   Diverticulosis    Family history of breast cancer    Family history of genetic disease carrier    father has CDKN2A and ATM mutation   Family history of melanoma    Family history of pancreatic cancer    Fracture of lower leg     fall 2011   GERD (gastroesophageal reflux disease)    History of basal cell carcinoma excision    History of gestational diabetes    x2   HTN (hypertension)    Hyperlipidemia    [id number] trx    Increased frequency of headaches 03/15/2012   Internal hemorrhoids    Obesity    [place]iz[place]    as an [place] d/t fever   Tubular adenoma of colon    UNSPECIFIED TACHYCARDIA 09/01/2010   Qualifier: Diagnosis of  By: BCharlett BlakeMD, SErline Levine     Past Surgical History:  Procedure Laterality Date   ABDOMINAL HYSTERECTOMY  06/29/2010    question had abnormal cells on Path, fibroids   BASAL CELL CARCINOMA EXCISION     BREAST BIOPSY Right 2009   COLONOSCOPY WITH PROPOFOL N/A 01/17/2019   Procedure: COLONOSCOPY WITH PROPOFOL;  Surgeon: JMilus Banister MD;  Location: [facility];  Service: Endoscopy;  Laterality: N/A;   ESOPHAGOGASTRODUODENOSCOPY (EGD) WITH PROPOFOL N/A 01/17/2019   Procedure: ESOPHAGOGASTRODUODENOSCOPY (EGD) WITH PROPOFOL;  Surgeon: Milus Banister, MD;  Location: [facility];  Service: Endoscopy;  Laterality: N/A;   EUS N/A 01/17/2019   Procedure: UPPER ENDOSCOPIC ULTRASOUND (EUS) RADIAL;  Surgeon: Milus Banister, MD;  Location: [facility];  Service: Endoscopy;  Laterality: N/A;   POLYPECTOMY  01/17/2019   Procedure: POLYPECTOMY;  Surgeon: Milus Banister, MD;  Location: [facility];  Service: Endoscopy;;   TONSILLECTOMY      Family History  Problem Relation Age of Onset   Melanoma Father        x 3 times, first in 16's   Hypertension Father    Hyperlipidemia Father    Squamous cell carcinoma Father    Colon polyps Father    Heart disease  Father    Pancreatic cancer Father 2       CDKN2A muation   Diabetes Maternal Grandmother    Hyperlipidemia Maternal Grandmother    Heart disease Maternal Grandmother    Arthritis Brother         Psoriatic   Pancreatic cancer Paternal Grandmother 25       met liver   Heart disease Paternal Grandfather    Breast cancer Brother 51       ATM + mutation? or VUS   Skin cancer Maternal Grandfather    Breast cancer Other    Skin cancer Cousin     Social History   Socioeconomic History   Marital status: Married    Spouse name: Not on file   Number of children: 2   Years of education: Not on file   Highest education level: Not on file  Occupational History   Occupation: Barista:  [employer]  Tobacco Use   Smoking status: Never   Smokeless tobacco: Never  Vaping Use   Vaping Use: Never used  Substance and Sexual Activity   Alcohol use: No   Drug use: No   Sexual activity: Not on file  Other Topics Concern   Not on file  Social History Narrative    Household of 4 -2 children   Son   to college     no pets     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    Nonsmoker married   Social Determinants of Radio broadcast assistant Strain: Not on file  Food Insecurity: Not on file  Transportation Needs: Not on file  Physical Activity: Not on file  Stress: Not on file  Social Connections: Not on file    Outpatient Medications Prior to Visit  Medication Sig Dispense Refill   BAYER CONTOUR NEXT TEST test strip Use as instructed 100 each 11   CONTOUR NEXT TEST test strip USE AS INSTRUCTED 100 strip 3   fexofenadine (ALLEGRA) 180 MG tablet Take 180 mg by mouth daily as needed (allergies.). In the morning      glucose blood (BAYER CONTOUR TEST) test strip Next strips Test 1-2 times daily Dx E11.9 100 each 12   ibuprofen (ADVIL,MOTRIN) 200 MG tablet Take 600 mg by mouth every 8 (eight) hours as needed (pain).      levocetirizine (XYZAL) 5 MG tablet Take 5 mg by mouth every evening.      lisinopril (ZESTRIL) 10 MG tablet TAKE 1 TABLET(10 MG) BY MOUTH DAILY FOR  HIGH BLOOD PRESSURE 30 tablet 0   LORazepam (ATIVAN) 1 MG tablet Take 1 tablet (1 mg total) by mouth as directed. 1 hour prior to your MRI appointment. DO NOT drive while taking this medication 1 tablet 0   metFORMIN (GLUCOPHAGE-XR) 500 MG 24 hr tablet TAKE 3 TABLETS BY MOUTH EVERY DAY WITH BREAKFAST 270 tablet 1   Microlet Lancets MISC USE TO CHECK BLOOD SUGAR ONE TO TWO TIMES DAILY 100 each 3   montelukast (SINGULAIR) 10 MG tablet Take 10 mg by mouth every evening.      Multiple Vitamin (MULTIVITAMIN WITH MINERALS) TABS tablet Take 1 tablet by mouth daily.     omeprazole (PRILOSEC) 20 MG capsule Take 20 mg by mouth every evening.      QNASL 80 MCG/ACT AERS Place 2 sprays into the nose every evening.      simvastatin (ZOCOR) 40 MG tablet Take 1 tablet (40 mg total) by mouth daily at 6 PM. 30 tablet 0   No facility-administered medications prior to visit.     EXAM:  BP 136/86 (BP Location: Left Arm, Patient Position: Sitting, Cuff Size: Normal)   Pulse 83   Temp 97.8 F (36.6 C) (Oral)   Ht _0  (1.6 m)   Wt 200 lb 6.4 oz (90.9 kg)   SpO2 96%   BMI 35.50 kg/m   Body mass index is 35.5 kg/m. Wt Readings from Last 3 Encounters:  09/08/21 200 lb 6.4 oz (90.9 kg)  08/19/20 202 lb 3.2 oz (91.7 kg)  07/19/19 195 lb (88.5 kg)    Physical Exam: Vital signs reviewed WUG:QBVQ is a well-developed well-nourished alert cooperative    who appearsr stated age in no acute distress.  HEENT: normocephalic atraumatic , Eyes: PERRL EOM's full, conjunctiva clear, Ears: no deformity EAC's clear TMs with normal landmarks but some wax in right ear. Mouth: masked  NECK: supple without masses, thyromegaly or bruits. CHEST/PULM:  Clear to auscultation and percussion breath sounds equal no wheeze , rales or rhonchi. No chest wall deformities or  tenderness. Breast: normal by inspection . No dimpling, discharge, masses, tenderness or discharge .  Right axilla feels full and there is a 1 cm subcutaneous question cyst that is mobile nonpainful some scarring. CV: PMI is nondisplaced, S1 S2 no gallops, murmurs, rubs. Peripheral pulses are full without delay.No JVD .  ABDOMEN: Bowel sounds normal nontender  No guard or rebound, no hepato splenomegal no CVA tenderness.   Extremtities:  No clubbing cyanosis or edema, no acute joint swelling or redness no focal atrophy NEURO:  Oriented x3, cranial nerves 3-12 appear to be intact, no obvious focal weakness,gait within normal limits no abnormal reflexes or asymmetrical SKIN: No acute rashes normal turgor, color, no bruising or petechiae.  Right fifth toe dark in color thickened skin appears clear PSYCH: Oriented, good eye contact, no obvious depression anxiety, cognition and judgment appear normal. LN: no cervical axillary inguinal adenopathy Diabetic Foot Exam - Simple   Simple Foot Form Visual Inspection No deformities, no ulcerations, no other skin breakdown bilaterally: Yes See comments: Yes Sensation Testing Intact to touch and monofilament testing bilaterally: Yes Pulse Check Posterior Tibialis and Dorsalis pulse intact bilaterally: Yes Comments Fourth and fifth toe on the right are thickened some discoloration on little toe dark     Lab Results  Component Value Date   WBC 8.6 08/26/2020   HGB 13.5 08/26/2020   HCT 39.8 08/26/2020   PLT 247 08/26/2020   GLUCOSE 136 (H)  08/26/2020   CHOL 145 08/26/2020   TRIG 78 08/26/2020   HDL 51 08/26/2020   LDLDIRECT 172.4 10/02/2007   LDLCALC 78 08/26/2020   ALT 17 08/26/2020   AST 11 08/26/2020   NA 138 08/26/2020   K 4.3 08/26/2020   CL 103 08/26/2020   CREATININE 0.57 08/26/2020   BUN 19 08/26/2020   CO2 25 08/26/2020   TSH 3.10 08/26/2020   HGBA1C 6.2 (H) 08/26/2020   MICROALBUR 2.4 08/26/2020    BP Readings from Last 3  Encounters:  09/08/21 136/86  08/19/20 126/74  07/19/19 126/68    Lab plan  reviewed with patient   ASSESSMENT AND PLAN:  Discussed the following assessment and plan:    ICD-10-CM   1. Visit for preventive health examination  L54.49 Basic metabolic panel    CBC with Differential/Platelet    Hemoglobin A1c    Hepatic function panel    Lipid panel    TSH    Microalbumin / creatinine urine ratio    Vitamin B12    2. Hyperlipidemia, unspecified hyperlipidemia type  E01.0 Basic metabolic panel    CBC with Differential/Platelet    Hemoglobin A1c    Hepatic function panel    Lipid panel    TSH    Microalbumin / creatinine urine ratio    Vitamin B12    3. Monoallelic mutation of OFHQ1F gene  X58.83 Basic metabolic panel   G54.98 CBC with Differential/Platelet    Hemoglobin A1c    Hepatic function panel    Lipid panel    TSH    Microalbumin / creatinine urine ratio    Vitamin B12    MM Digital Diagnostic Unilat R    MM Digital Diagnostic Bilat    US BREAST LTD UNI RIGHT INC AXILLA    4. Type 2 diabetes mellitus with hyperglycemia, without long-term current use of insulin (HCC)  Y64.15 Basic metabolic panel    CBC with Differential/Platelet    Hemoglobin A1c    Hepatic function panel    Lipid panel    TSH    Microalbumin / creatinine urine ratio    Vitamin B12    5. Essential hypertension  A30 Basic metabolic panel    CBC with Differential/Platelet    Hemoglobin A1c    Hepatic function panel    Lipid panel    TSH    Microalbumin / creatinine urine ratio    Vitamin B12    6. Medication management  N40.768 Basic metabolic panel    CBC with Differential/Platelet    Hemoglobin A1c    Hepatic function panel    Lipid panel    TSH    Microalbumin / creatinine urine ratio    Vitamin B12    7. Breast symptom  G88.11 Basic metabolic panel    CBC with Differential/Platelet    Hemoglobin A1c    Hepatic function panel    Lipid panel    TSH    Microalbumin /  creatinine urine ratio    Vitamin B12    MM Digital Diagnostic Unilat R    MM Digital Diagnostic Bilat    US BREAST LTD UNI RIGHT INC AXILLA    8. Need for influenza vaccination  Z23 Flu Vaccine QUAD 6+ mos PF IM (Fluarix Quad PF)    Lab work ordered fasting Plan on diagnostic mammogram and ultrasound including into the axilla. She is high risk because of gene mutation No discrete breast lump. Sleep difficulty could be perimenopausal postmenopausal  question whether she would be helped by certain medications.  There may be some anxiety component.  Would not think she is a candidate for any hormonal manipulation because of risk history.   Return for depending on results 6 mos.  Patient Care Team: Romayne Ticas, Standley Brooking, MD as PCP - General Gwendolyn Fill, MD as Attending Physician (Obstetrics and Gynecology) Tiajuana Amass, MD (Allergy) [place], Jennefer Bravo, MD (Dermatology) Pyrtle, Lajuan Lines, MD as Consulting Physician (Gastroenterology) Patient Instructions  Good to see you today   Will plan on diagnostic mammogram and [place] right . You should be contacted about this.   Get fasting lab appt.   Flu vaccine today  Make appt for   shingrix vaccine when convenient.   Consider seeing derm about the discolored toenail  if progressing   poss from  trauma fungus infection but check since dark color.  Talks wit gyne about sleep   consider non hormonal  treatments   for menopausal sleep issues . ?  Gabapentin ? Citalopram?       Standley Brooking. Rosalene Wardrop M.D.

## 2021-09-08 ENCOUNTER — Encounter: Payer: Self-pay | Admitting: Internal Medicine

## 2021-09-08 ENCOUNTER — Ambulatory Visit (INDEPENDENT_AMBULATORY_CARE_PROVIDER_SITE_OTHER): Payer: BC Managed Care – PPO | Admitting: Internal Medicine

## 2021-09-08 VITALS — BP 136/86 | HR 83 | Temp 97.8°F | Ht 63.0 in | Wt 200.4 lb

## 2021-09-08 DIAGNOSIS — Z23 Encounter for immunization: Secondary | ICD-10-CM | POA: Diagnosis not present

## 2021-09-08 DIAGNOSIS — Z79899 Other long term (current) drug therapy: Secondary | ICD-10-CM

## 2021-09-08 DIAGNOSIS — I10 Essential (primary) hypertension: Secondary | ICD-10-CM | POA: Diagnosis not present

## 2021-09-08 DIAGNOSIS — E1165 Type 2 diabetes mellitus with hyperglycemia: Secondary | ICD-10-CM

## 2021-09-08 DIAGNOSIS — N6459 Other signs and symptoms in breast: Secondary | ICD-10-CM

## 2021-09-08 DIAGNOSIS — Z1509 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: Secondary | ICD-10-CM

## 2021-09-08 DIAGNOSIS — Z0000 Encounter for general adult medical examination without abnormal findings: Secondary | ICD-10-CM | POA: Diagnosis not present

## 2021-09-08 DIAGNOSIS — Z1501 Genetic susceptibility to malignant neoplasm of breast: Secondary | ICD-10-CM

## 2021-09-08 DIAGNOSIS — E785 Hyperlipidemia, unspecified: Secondary | ICD-10-CM

## 2021-09-08 NOTE — Patient Instructions (Addendum)
Good to see you today   Will plan on diagnostic mammogram and [place] right . You should be contacted about this.   Get fasting lab appt.   Flu vaccine today  Make appt for   shingrix vaccine when convenient.   Consider seeing derm about the discolored toenail  if progressing   poss from  trauma fungus infection but check since dark color.  Talks wit gyne about sleep   consider non hormonal  treatments   for menopausal sleep issues . ?  Gabapentin ? Citalopram?

## 2021-09-09 ENCOUNTER — Other Ambulatory Visit (INDEPENDENT_AMBULATORY_CARE_PROVIDER_SITE_OTHER): Payer: BC Managed Care – PPO

## 2021-09-09 ENCOUNTER — Other Ambulatory Visit: Payer: Self-pay | Admitting: Internal Medicine

## 2021-09-09 ENCOUNTER — Other Ambulatory Visit: Payer: Self-pay

## 2021-09-09 DIAGNOSIS — Z1509 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: Secondary | ICD-10-CM

## 2021-09-09 DIAGNOSIS — E1165 Type 2 diabetes mellitus with hyperglycemia: Secondary | ICD-10-CM | POA: Diagnosis not present

## 2021-09-09 DIAGNOSIS — I10 Essential (primary) hypertension: Secondary | ICD-10-CM

## 2021-09-09 DIAGNOSIS — Z0000 Encounter for general adult medical examination without abnormal findings: Secondary | ICD-10-CM

## 2021-09-09 DIAGNOSIS — E785 Hyperlipidemia, unspecified: Secondary | ICD-10-CM

## 2021-09-09 DIAGNOSIS — H8113 Benign paroxysmal vertigo, bilateral: Secondary | ICD-10-CM | POA: Diagnosis not present

## 2021-09-09 DIAGNOSIS — J3089 Other allergic rhinitis: Secondary | ICD-10-CM | POA: Diagnosis not present

## 2021-09-09 DIAGNOSIS — R2231 Localized swelling, mass and lump, right upper limb: Secondary | ICD-10-CM

## 2021-09-09 DIAGNOSIS — N6459 Other signs and symptoms in breast: Secondary | ICD-10-CM

## 2021-09-09 DIAGNOSIS — Z79899 Other long term (current) drug therapy: Secondary | ICD-10-CM

## 2021-09-09 DIAGNOSIS — Z1501 Genetic susceptibility to malignant neoplasm of breast: Secondary | ICD-10-CM

## 2021-09-09 LAB — LIPID PANEL
Cholesterol: 142 mg/dL (ref 0–200)
HDL: 46.5 mg/dL (ref >39.00)
LDL Cholesterol: 77 mg/dL (ref 0–99)
NonHDL: 95.08
Total CHOL/HDL Ratio: 3
Triglycerides: 91 mg/dL (ref 0.0–149.0)
VLDL: 18.2 mg/dL (ref 0.0–40.0)

## 2021-09-09 LAB — CBC WITH DIFFERENTIAL/PLATELET
Basophils Absolute: 0 10*3/uL (ref 0.0–0.1)
Basophils Relative: 0.4 % (ref 0.0–3.0)
Eosinophils Absolute: 0.1 10*3/uL (ref 0.0–0.7)
Eosinophils Relative: 1.6 % (ref 0.0–5.0)
HCT: 39.8 % (ref 36.0–46.0)
Hemoglobin: 13.4 g/dL (ref 12.0–15.0)
Lymphocytes Relative: 22.5 % (ref 12.0–46.0)
Lymphs Abs: 1.6 10*3/uL (ref 0.7–4.0)
MCHC: 33.7 g/dL (ref 30.0–36.0)
MCV: 87.9 fl (ref 78.0–100.0)
Monocytes Absolute: 0.6 10*3/uL (ref 0.1–1.0)
Monocytes Relative: 8.2 % (ref 3.0–12.0)
Neutro Abs: 4.7 10*3/uL (ref 1.4–7.7)
Neutrophils Relative %: 67.3 % (ref 43.0–77.0)
Platelets: 200 10*3/uL (ref 150.0–400.0)
RBC: 4.53 Mil/uL (ref 3.87–5.11)
RDW: 12.8 % (ref 11.5–15.5)
WBC: 7.1 10*3/uL (ref 4.0–10.5)

## 2021-09-09 LAB — MICROALBUMIN / CREATININE URINE RATIO
Creatinine,U: 220.6 mg/dL
Microalb Creat Ratio: 1.8 mg/g (ref 0.0–30.0)
Microalb, Ur: 3.9 mg/dL — ABNORMAL HIGH (ref 0.0–1.9)

## 2021-09-09 LAB — HEPATIC FUNCTION PANEL
ALT: 19 U/L (ref 0–35)
AST: 13 U/L (ref 0–37)
Albumin: 4.4 g/dL (ref 3.5–5.2)
Alkaline Phosphatase: 53 U/L (ref 39–117)
Bilirubin, Direct: 0.2 mg/dL (ref 0.0–0.3)
Total Bilirubin: 1 mg/dL (ref 0.2–1.2)
Total Protein: 6.5 g/dL (ref 6.0–8.3)

## 2021-09-09 LAB — BASIC METABOLIC PANEL
BUN: 19 mg/dL (ref 6–23)
CO2: 26 mEq/L (ref 19–32)
Calcium: 9.3 mg/dL (ref 8.4–10.5)
Chloride: 103 mEq/L (ref 96–112)
Creatinine, Ser: 0.64 mg/dL (ref 0.40–1.20)
GFR: 102.26 mL/min (ref >60.00)
Glucose, Bld: 124 mg/dL — ABNORMAL HIGH (ref 70–99)
Potassium: 4.5 mEq/L (ref 3.5–5.1)
Sodium: 139 mEq/L (ref 135–145)

## 2021-09-09 LAB — VITAMIN B12: Vitamin B-12: 218 pg/mL (ref 211–911)

## 2021-09-09 LAB — TSH: TSH: 2.55 u[IU]/mL (ref 0.35–5.50)

## 2021-09-09 LAB — HEMOGLOBIN A1C: Hgb A1c MFr Bld: 6.5 % (ref 4.6–6.5)

## 2021-09-09 MED ORDER — SIMVASTATIN 40 MG PO TABS: 40.0000 mg | ORAL_TABLET | Freq: Every day | ORAL | 1 refills | Status: DC

## 2021-09-09 MED ORDER — LISINOPRIL 10 MG PO TABS: ORAL_TABLET | ORAL | 1 refills | Status: DC

## 2021-09-09 NOTE — Progress Notes (Signed)
A1c see up slightly but still well controlled. B12 is on the low side of normal Please add vitamin B12 at 1000 to 2000 mcg/day sublingual may be better absorbed than a pill.  Sometimes B12 levels go down when you are on metformin a while. Cholesterol okay Someone should contact you about the diagnostic mammogram Otherwise R OV in about 6 months to check your A1c.

## 2021-09-17 ENCOUNTER — Ambulatory Visit: Payer: BC Managed Care – PPO

## 2021-09-21 DIAGNOSIS — L738 Other specified follicular disorders: Secondary | ICD-10-CM | POA: Diagnosis not present

## 2021-09-21 DIAGNOSIS — Z23 Encounter for immunization: Secondary | ICD-10-CM | POA: Diagnosis not present

## 2021-09-21 DIAGNOSIS — S91209A Unspecified open wound of unspecified toe(s) with damage to nail, initial encounter: Secondary | ICD-10-CM | POA: Diagnosis not present

## 2021-09-24 ENCOUNTER — Other Ambulatory Visit: Payer: Self-pay

## 2021-09-24 ENCOUNTER — Ambulatory Visit (INDEPENDENT_AMBULATORY_CARE_PROVIDER_SITE_OTHER): Payer: BC Managed Care – PPO | Admitting: *Deleted

## 2021-09-24 DIAGNOSIS — Z23 Encounter for immunization: Secondary | ICD-10-CM | POA: Diagnosis not present

## 2021-10-08 ENCOUNTER — Ambulatory Visit
Admission: RE | Admit: 2021-10-08 | Discharge: 2021-10-08 | Disposition: A | Discharge status: Home / Self Care | Payer: BC Managed Care – PPO | Source: Ambulatory Visit | Attending: Internal Medicine | Admitting: Internal Medicine

## 2021-10-08 ENCOUNTER — Other Ambulatory Visit: Payer: Self-pay

## 2021-10-08 DIAGNOSIS — R922 Inconclusive mammogram: Secondary | ICD-10-CM | POA: Diagnosis not present

## 2021-10-08 DIAGNOSIS — R2231 Localized swelling, mass and lump, right upper limb: Secondary | ICD-10-CM

## 2021-10-08 DIAGNOSIS — N6001 Solitary cyst of right breast: Secondary | ICD-10-CM | POA: Diagnosis not present

## 2021-10-10 NOTE — Progress Notes (Signed)
Good news area is a sebaceous cyst and not a breast lesion

## 2021-10-17 NOTE — Progress Notes (Signed)
Please do referral to   surgery for   growing breast cyst

## 2021-10-18 ENCOUNTER — Other Ambulatory Visit: Payer: Self-pay

## 2021-10-18 DIAGNOSIS — N6009 Solitary cyst of unspecified breast: Secondary | ICD-10-CM

## 2021-11-09 ENCOUNTER — Other Ambulatory Visit: Payer: Self-pay | Admitting: Internal Medicine

## 2021-11-09 DIAGNOSIS — Z1231 Encounter for screening mammogram for malignant neoplasm of breast: Secondary | ICD-10-CM

## 2021-12-10 ENCOUNTER — Ambulatory Visit
Admission: RE | Admit: 2021-12-10 | Discharge: 2021-12-10 | Disposition: A | Discharge status: Home / Self Care | Payer: BC Managed Care – PPO | Source: Ambulatory Visit | Attending: Internal Medicine | Admitting: Internal Medicine

## 2021-12-10 DIAGNOSIS — Z1231 Encounter for screening mammogram for malignant neoplasm of breast: Secondary | ICD-10-CM | POA: Diagnosis not present

## 2021-12-16 ENCOUNTER — Ambulatory Visit: Payer: BC Managed Care – PPO

## 2021-12-16 DIAGNOSIS — L723 Sebaceous cyst: Secondary | ICD-10-CM | POA: Diagnosis not present

## 2022-01-13 ENCOUNTER — Other Ambulatory Visit: Payer: Self-pay | Admitting: General Surgery

## 2022-01-13 DIAGNOSIS — L720 Epidermal cyst: Secondary | ICD-10-CM | POA: Diagnosis not present

## 2022-01-16 ENCOUNTER — Other Ambulatory Visit: Payer: Self-pay | Admitting: Internal Medicine

## 2022-01-21 ENCOUNTER — Ambulatory Visit (INDEPENDENT_AMBULATORY_CARE_PROVIDER_SITE_OTHER): Payer: BC Managed Care – PPO | Admitting: *Deleted

## 2022-01-21 DIAGNOSIS — Z23 Encounter for immunization: Secondary | ICD-10-CM | POA: Diagnosis not present

## 2022-02-01 DIAGNOSIS — L814 Other melanin hyperpigmentation: Secondary | ICD-10-CM | POA: Diagnosis not present

## 2022-02-01 DIAGNOSIS — L821 Other seborrheic keratosis: Secondary | ICD-10-CM | POA: Diagnosis not present

## 2022-02-01 DIAGNOSIS — Z808 Family history of malignant neoplasm of other organs or systems: Secondary | ICD-10-CM | POA: Diagnosis not present

## 2022-02-01 DIAGNOSIS — D485 Neoplasm of uncertain behavior of skin: Secondary | ICD-10-CM | POA: Diagnosis not present

## 2022-02-01 DIAGNOSIS — D225 Melanocytic nevi of trunk: Secondary | ICD-10-CM | POA: Diagnosis not present

## 2022-03-01 ENCOUNTER — Other Ambulatory Visit: Payer: Self-pay | Admitting: Internal Medicine

## 2022-03-08 ENCOUNTER — Ambulatory Visit: Payer: BC Managed Care – PPO | Admitting: Internal Medicine

## 2022-03-08 ENCOUNTER — Encounter: Payer: Self-pay | Admitting: Internal Medicine

## 2022-03-08 VITALS — BP 118/80 | HR 93 | Temp 98.1°F | Ht 63.0 in | Wt 205.0 lb

## 2022-03-08 DIAGNOSIS — R002 Palpitations: Secondary | ICD-10-CM

## 2022-03-08 DIAGNOSIS — E1165 Type 2 diabetes mellitus with hyperglycemia: Secondary | ICD-10-CM | POA: Diagnosis not present

## 2022-03-08 DIAGNOSIS — Z1501 Genetic susceptibility to malignant neoplasm of breast: Secondary | ICD-10-CM

## 2022-03-08 DIAGNOSIS — Z1509 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: Secondary | ICD-10-CM

## 2022-03-08 DIAGNOSIS — I10 Essential (primary) hypertension: Secondary | ICD-10-CM

## 2022-03-08 DIAGNOSIS — Z79899 Other long term (current) drug therapy: Secondary | ICD-10-CM | POA: Diagnosis not present

## 2022-03-08 LAB — BASIC METABOLIC PANEL
BUN: 17 mg/dL (ref 6–23)
CO2: 26 mEq/L (ref 19–32)
Calcium: 9.4 mg/dL (ref 8.4–10.5)
Chloride: 101 mEq/L (ref 96–112)
Creatinine, Ser: 0.65 mg/dL (ref 0.40–1.20)
GFR: 101.52 mL/min (ref >60.00)
Glucose, Bld: 151 mg/dL — ABNORMAL HIGH (ref 70–99)
Potassium: 4.3 mEq/L (ref 3.5–5.1)
Sodium: 137 mEq/L (ref 135–145)

## 2022-03-08 LAB — HEMOGLOBIN A1C: Hgb A1c MFr Bld: 7.4 % — ABNORMAL HIGH (ref 4.6–6.5)

## 2022-03-08 LAB — T4, FREE: Free T4: 0.82 ng/dL (ref 0.60–1.60)

## 2022-03-08 LAB — VITAMIN B12: Vitamin B-12: 884 pg/mL (ref 211–911)

## 2022-03-08 LAB — TSH: TSH: 2.17 u[IU]/mL (ref 0.35–5.50)

## 2022-03-08 NOTE — Progress Notes (Signed)
Results stable except  a1c is upas expected .  We could add  a sglt2 md such as farxiga  daily ( good for  kidney protection in addition to BG control) and   or increase the metformin to 4 x 500 mg per day  sa tolerated  to help control . Advise FU in  4 mos to recheck a1c level  and make sure not  increasing  ?Let me know which  med adjustment you prefer

## 2022-03-08 NOTE — Patient Instructions (Addendum)
Exam is reassuring today . ?EKG ok  ? ?Lab today . ?Monitor and if  persistent or progressive problem we can do another look ? ?Premature beats very common .  ? ?Plan fu depending  or  6 months preventive and lab ?

## 2022-03-08 NOTE — Progress Notes (Signed)
? ?Chief Complaint  ?Patient presents with  ? Follow-up  ?  Fasting,discuss heart  ? ? ?HPI: ?Nicole Gonzalez 51 y.o. come in for Chronic disease management  ?DM   ?Stress   no assistance at work  . ?Less eating help.  ?Felt  palpitiation   not exercise related .  Issues  5-6 x this year.  Last seconds  No asoc sob syncope  pain ?Would like B12 checked since it is been low. ?ROS: See pertinent positives and negatives per HPI. ? ?Past Medical History:  ?Diagnosis Date  ? Anemia   ?  better after hysterectomy  ? Cancer [facility])   ? basil cell carcinoma/forehead  ? Diabetes mellitus without complication ([place])   ? DIABETES MELLITUS, GESTATIONAL, HX OF 06/02/2009  ? Qualifier: Diagnosis of  By: Regis Bill MD, Standley Brooking   ? Diverticulosis   ? Family history of breast cancer   ? Family history of genetic disease carrier   ? father has CDKN2A and ATM mutation  ? Family history of melanoma   ? Family history of pancreatic cancer   ? Fracture of lower leg   ?  fall 2011  ? GERD (gastroesophageal reflux disease)   ? History of basal cell carcinoma excision   ? History of gestational diabetes   ? x2  ? HTN (hypertension)   ? Hyperlipidemia   ? [id number] trx   ? Increased frequency of headaches 03/15/2012  ? Internal hemorrhoids   ? Obesity   ? Seizures ([place])   ? as an infant d/t fever  ? Tubular adenoma of colon   ? UNSPECIFIED TACHYCARDIA 09/01/2010  ? Qualifier: Diagnosis of  By: Charlett Blake MD, Erline Levine    ? ? ?Family History  ?Problem Relation Age of Onset  ? Melanoma Father   ?     x 3 times, first in 55's  ? Hypertension Father   ? Hyperlipidemia Father   ? Squamous cell carcinoma Father   ? Colon polyps Father   ? Heart disease Father   ? Pancreatic cancer Father 74  ?     CDKN2A muation  ? Diabetes Maternal Grandmother   ? Hyperlipidemia Maternal Grandmother   ? Heart disease Maternal Grandmother   ? Arthritis Brother   ?      Psoriatic  ? Pancreatic cancer Paternal Grandmother 89  ?     met liver  ? Heart disease Paternal Grandfather   ?  Breast cancer Brother 47  ?     ATM + mutation? or VUS  ? Skin cancer Maternal Grandfather   ? Breast cancer Other   ? Skin cancer Cousin   ? ? ?Social History  ? ?Socioeconomic History  ? Marital status: Married  ?  Spouse name: Not on file  ? Number of children: 2  ? Years of education: Not on file  ? Highest education level: Not on file  ?Occupational History  ? Occupation: Agricultural consultant  ?  Employer: [employer]  ?Tobacco Use  ? Smoking status: Never  ? Smokeless tobacco: Never  ?Vaping Use  ? Vaping Use: Never used  ?Substance and Sexual Activity  ? Alcohol use: No  ? Drug use: No  ? Sexual activity: Not on file  ?Other Topics Concern  ? Not on file  ?Social History Narrative  ?  Household of 4 -2 children   Son   to college   ?  no pets   ?  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  Graduated [employer]  ?  Nonsmoker married  ? ?Social Determinants of Health  ? ?Financial Resource Strain: Not on file  ?Food Insecurity: Not on file  ?Transportation Needs: Not on file  ?Physical Activity: Not on file  ?Stress: Not on file  ?Social Connections: Not on file  ? ? ?Outpatient Medications Prior to Visit  ?Medication Sig Dispense Refill  ? BAYER CONTOUR NEXT TEST test strip Use as instructed 100 each 11  ? CONTOUR NEXT TEST test strip USE AS INSTRUCTED 100 strip 3  ? fexofenadine (ALLEGRA) 180 MG tablet Take 180 mg by mouth daily as needed (allergies.). In the morning     ? glucose blood (BAYER CONTOUR TEST) test strip Next strips Test 1-2 times daily Dx E11.9 100 each 12  ? ibuprofen (ADVIL,MOTRIN) 200 MG tablet Take 600 mg by mouth every 8 (eight) hours as needed (pain).    ? levocetirizine (XYZAL) 5 MG tablet Take 5 mg by mouth every evening.     ? lisinopril (ZESTRIL) 10 MG tablet TAKE 1 TABLET(10 MG) BY MOUTH DAILY FOR HIGH BLOOD PRESSURE 90 tablet 1  ? LORazepam (ATIVAN) 1 MG tablet Take 1 tablet (1 mg total) by mouth as directed. 1 hour prior to your MRI appointment. DO NOT drive while taking  this medication 1 tablet 0  ? metFORMIN (GLUCOPHAGE-XR) 500 MG 24 hr tablet TAKE 3 TABLETS BY MOUTH EVERY DAY WITH BREAKFAST 270 tablet 1  ? Microlet Lancets MISC USE TO CHECK BLOOD SUGAR ONE TO TWO TIMES DAILY 100 each 3  ? montelukast (SINGULAIR) 10 MG tablet Take 10 mg by mouth every evening.     ? Multiple Vitamin (MULTIVITAMIN WITH MINERALS) TABS tablet Take 1 tablet by mouth daily.    ? omeprazole (PRILOSEC) 20 MG capsule Take 20 mg by mouth every evening.     ? QNASL 80 MCG/ACT AERS Place 2 sprays into the nose every evening.     ? simvastatin (ZOCOR) 40 MG tablet TAKE 1 TABLET(40 MG) BY MOUTH DAILY AT 6 PM 90 tablet 1  ? ?No facility-administered medications prior to visit.  ? ? ? ?EXAM: ? ?BP 118/80 (BP Location: Left Arm, Patient Position: Sitting, Cuff Size: Normal)   Pulse 93   Temp 98.1 ?F (36.7 ?C) (Oral)   Ht 5' 3" (1.6 m)   Wt 205 lb (93 kg)   SpO2 96%   BMI 36.31 kg/m?  ? ?Body mass index is 36.31 kg/m?. ? ?GENERAL: vitals reviewed and listed above, alert, oriented, appears well hydrated and in no acute distress ?HEENT: atraumatic, conjunctiva  clear, no obvious abnormalities on inspection of external nose and ears  ?NECK: no obvious masses on inspection palpation  ?LUNGS: clear to auscultation bilaterally, no wheezes, rales or rhonchi, good air movement ?CV: HRRR, no clubbing cyanosis or  peripheral edema nl cap refill  ?Abdomen:  Sof,t normal bowel sounds without hepatosplenomegaly, no guarding rebound or masses no CVA tenderness ?MS: moves all extremities without noticeable focal  abnormality ?PSYCH: pleasant and cooperative, no obvious depression or anxiety ?Lab Results  ?Component Value Date  ? WBC 7.1 09/09/2021  ? HGB 13.4 09/09/2021  ? HCT 39.8 09/09/2021  ? PLT 200.0 09/09/2021  ? GLUCOSE 151 (H) 03/08/2022  ? CHOL 142 09/09/2021  ? TRIG 91.0 09/09/2021  ? HDL 46.50 09/09/2021  ? LDLDIRECT 172.4 10/02/2007  ? [place] 77 09/09/2021  ? ALT 19 09/09/2021  ? AST  13 09/09/2021  ? NA 137  03/08/2022  ? K 4.3 03/08/2022  ? CL 101 03/08/2022  ? CREATININE 0.65 03/08/2022  ? BUN 17 03/08/2022  ? CO2 26 03/08/2022  ? TSH 2.17 03/08/2022  ? HGBA1C 7.4 (H) 03/08/2022  ? MICROALBUR 3.9 (H) 09/09/2021  ? ?BP Readings from Last 3 Encounters:  ?03/08/22 118/80  ?09/08/21 136/86  ?08/19/20 126/74  ?EKG NSR low voltage  percordials  rr  ? ?ASSESSMENT AND PLAN: ? ?Discussed the following assessment and plan: ? ?Palpitations - Plan: Basic metabolic panel, Hemoglobin A1c, TSH, Vitamin B12, T4, free, EKG 12-Lead, T4, free, Vitamin B12, TSH, Hemoglobin Q6P, Basic metabolic panel ? ?Type 2 diabetes mellitus with hyperglycemia, without long-term current use of insulin (HCC) - Plan: Basic metabolic panel, Hemoglobin A1c, TSH, Vitamin B12, T4, free, T4, free, Vitamin B12, TSH, Hemoglobin Y1P, Basic metabolic panel ? ?Essential hypertension - Plan: Basic metabolic panel, Hemoglobin A1c, TSH, Vitamin B12, T4, free, T4, free, Vitamin B12, TSH, Hemoglobin J0D, Basic metabolic panel ? ?Medication management - Plan: Basic metabolic panel, Hemoglobin A1c, TSH, Vitamin B12, T4, free, T4, free, Vitamin B12, TSH, Hemoglobin T2I, Basic metabolic panel ? ?Monoallelic mutation of ZTIW5Y gene ?Suspect these are benign premature beats.  No associated symptoms in context we will follow check lab today ?Explained if needed we could do echo Zio patch monitor cards eval but I think this sounds benign. ?-Patient advised to return or notify health care team  if  new concerns arise. ? ?Patient Instructions  ?Exam is reassuring today . ?EKG ok  ? ?Lab today . ?Monitor and if  persistent or progressive problem we can do another look ? ?Premature beats very common .  ? ?Plan fu depending  or  6 months preventive and lab ? ? ?Standley Brooking. Panosh M.D. ?

## 2022-03-10 MED ORDER — VITAMIN B-12 1000 MCG SL SUBL: 1.0000 | SUBLINGUAL_TABLET | Freq: Every day | SUBLINGUAL | 3 refills | Status: AC

## 2022-03-10 NOTE — Addendum Note (Signed)
Addended byBurnis Medin on: 03/10/2022 04:58 PM ? ? Modules accepted: Orders ? ?

## 2022-04-11 DIAGNOSIS — Z9189 Other specified personal risk factors, not elsewhere classified: Secondary | ICD-10-CM | POA: Diagnosis not present

## 2022-04-11 DIAGNOSIS — Z6835 Body mass index (BMI) 35.0-35.9, adult: Secondary | ICD-10-CM | POA: Diagnosis not present

## 2022-04-11 DIAGNOSIS — N951 Menopausal and female climacteric states: Secondary | ICD-10-CM | POA: Diagnosis not present

## 2022-04-11 DIAGNOSIS — Z01419 Encounter for gynecological examination (general) (routine) without abnormal findings: Secondary | ICD-10-CM | POA: Diagnosis not present

## 2022-04-12 ENCOUNTER — Other Ambulatory Visit: Payer: Self-pay | Admitting: Obstetrics & Gynecology

## 2022-04-12 DIAGNOSIS — Z9189 Other specified personal risk factors, not elsewhere classified: Secondary | ICD-10-CM

## 2022-04-23 ENCOUNTER — Ambulatory Visit
Admission: RE | Admit: 2022-04-23 | Discharge: 2022-04-23 | Disposition: A | Discharge status: Home / Self Care | Payer: BC Managed Care – PPO | Source: Ambulatory Visit | Attending: Obstetrics & Gynecology | Admitting: Obstetrics & Gynecology

## 2022-04-23 DIAGNOSIS — Z1239 Encounter for other screening for malignant neoplasm of breast: Secondary | ICD-10-CM | POA: Diagnosis not present

## 2022-04-23 DIAGNOSIS — Z9189 Other specified personal risk factors, not elsewhere classified: Secondary | ICD-10-CM

## 2022-04-23 MED ORDER — GADOBUTROL 1 MMOL/ML IV SOLN
10.0000 mL | Freq: Once | INTRAVENOUS | Status: AC | PRN
  Administered 2022-04-23: 10 mL via INTRAVENOUS

## 2022-05-12 DIAGNOSIS — Z1239 Encounter for other screening for malignant neoplasm of breast: Secondary | ICD-10-CM | POA: Diagnosis not present

## 2022-06-15 ENCOUNTER — Ambulatory Visit: Payer: BC Managed Care – PPO | Admitting: Physician Assistant

## 2022-06-15 ENCOUNTER — Encounter: Payer: Self-pay | Admitting: Physician Assistant

## 2022-06-15 VITALS — BP 126/72 | HR 80 | Ht 63.5 in | Wt 206.0 lb

## 2022-06-15 DIAGNOSIS — Z1509 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: Secondary | ICD-10-CM

## 2022-06-15 DIAGNOSIS — Z1501 Genetic susceptibility to malignant neoplasm of breast: Secondary | ICD-10-CM

## 2022-06-15 DIAGNOSIS — Z800 Family history of malignant neoplasm of digestive organs: Secondary | ICD-10-CM | POA: Diagnosis not present

## 2022-06-15 MED ORDER — LORAZEPAM 1 MG PO TABS
1.0000 mg | ORAL_TABLET | ORAL | 0 refills | Status: DC
Start: 2022-06-15 — End: 2023-06-30

## 2022-06-15 NOTE — Progress Notes (Signed)
Subjective:    Patient ID: Nicole Gonzalez, female    DOB: [birth year], 51 y.o.   MRN: [id number]  HPI  Nicole Gonzalez is a pleasant 51 year old white female, established with Dr. Hilarie Fredrickson who comes in today to discuss annual follow-up MRI/MRCP. Patient was last seen in the office in 2019. She has history of adenomatous colon polyps, diverticulosis, probable IBS and has known genetic predisposition for pancreatic cancer and melanoma with positive CDK N2A gene and family history of pancreatic cancer in her father and paternal grandmother, prior melanoma in her father. Patient had undergone colonoscopy in February 2020 with finding of one 3 mm polyp which was removed and path showed this to be a sessile serrated polyp.  She is indicated for 5-year interval follow-up. She also had EUS at that same setting per Dr. Ardis Hughs with finding of a normal-appearing pancreatic parenchyma, normal main pancreatic duct, common bile duct within normal limits, there were 2 benign-appearing periportal nodes. She had MRI of the abdomen/MRCP in February 2021 which was negative, and MRI/MRCP March 2022 which was also normal other than comment of mild hepatic steatosis.  She says she is feeling fine currently, no active problems does occasionally have an episode of diarrhea that she feels is exacerbated by certain foods.  She has not noticed any melena or hematochezia. She relates that she was told by Dr. Fang/oncology to continue annual pancreatic imaging.   Review of Systems Pertinent positive and negative review of systems were noted in the above HPI section.  All other review of systems was otherwise negative.   Outpatient Encounter Medications as of 06/15/2022  Medication Sig   BAYER CONTOUR NEXT TEST test strip Use as instructed   CONTOUR NEXT TEST test strip USE AS INSTRUCTED   Cyanocobalamin (VITAMIN B-12) 1000 MCG SUBL Place 1 tablet (1,000 mcg total) under the tongue daily.   fexofenadine (ALLEGRA) 180 MG tablet Take 180  mg by mouth daily as needed (allergies.). In the morning    glucose blood (BAYER CONTOUR TEST) test strip Next strips Test 1-2 times daily Dx E11.9   ibuprofen (ADVIL,MOTRIN) 200 MG tablet Take 600 mg by mouth every 8 (eight) hours as needed (pain).   levocetirizine (XYZAL) 5 MG tablet Take 5 mg by mouth every evening.    lisinopril (ZESTRIL) 10 MG tablet TAKE 1 TABLET(10 MG) BY MOUTH DAILY FOR HIGH BLOOD PRESSURE   LORazepam (ATIVAN) 1 MG tablet Take 1 tablet (1 mg total) by mouth as directed. 1 hour prior to your MRI appointment. DO NOT drive while taking this medication   metFORMIN (GLUCOPHAGE-XR) 500 MG 24 hr tablet TAKE 3 TABLETS BY MOUTH EVERY DAY WITH BREAKFAST   Microlet Lancets MISC USE TO CHECK BLOOD SUGAR ONE TO TWO TIMES DAILY   montelukast (SINGULAIR) 10 MG tablet Take 10 mg by mouth every evening.    Multiple Vitamin (MULTIVITAMIN WITH MINERALS) TABS tablet Take 1 tablet by mouth daily.   omeprazole (PRILOSEC) 20 MG capsule Take 20 mg by mouth every evening.    QNASL 80 MCG/ACT AERS Place 2 sprays into the nose every evening.    simvastatin (ZOCOR) 40 MG tablet TAKE 1 TABLET(40 MG) BY MOUTH DAILY AT 6 PM   No facility-administered encounter medications on file as of 06/15/2022.   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   Hyperlipidemia 03/27/2015   Family history of breast cancer in female 03/27/2015    Rhinitis, allergic 10/16/2014   Left maxillary sinusitis 10/16/2014   Prediabetes 09/24/2014   Plantar fasciitis 10/22/2013   Abdominal swelling, mass, or lump felt by patient 03/26/2013   Leg length discrepancy 03/22/2013   Newly diagnosed diabetes ([place]) 03/22/2013   Abdominal pain, chronic, right lower quadrant 03/22/2013   History of basal cell carcinoma excision    Visit for preventive health examination 03/15/2012   Hx of abdominal pain 03/15/2012   Anemia    OBESITY 07/15/2010   RHINITIS 07/15/2010   HYPERGLYCEMIA 07/15/2010   Essential hypertension 06/02/2009   DIABETES MELLITUS, GESTATIONAL, HX OF 06/02/2009   KNEE PAIN 06/03/2008   HYPERLIPIDEMIA 10/09/2007   GERD 10/09/2007   HEMATURIA 10/09/2007   HIDRADENITIS 10/09/2007   Social History   Socioeconomic History   Marital status: Married    Spouse name: Not on file   Number of children: 2   Years of education: Not on file   Highest education level: Not on file  Occupational History   Occupation: Barista: [employer]  Tobacco Use   Smoking status: Never   Smokeless tobacco: Never  Vaping Use   Vaping Use: Never used  Substance and Sexual Activity   Alcohol use: No   Drug use: No   Sexual activity: Not on file  Other Topics Concern   Not on file  Social History Narrative    Household of 4 -2 children   Son   to college     no pets     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    Nonsmoker married   Social Determinants of Radio broadcast assistant Strain: Not on file  Food Insecurity: Not on file  Transportation Needs: Not on file  Physical Activity: Not on file  Stress: Not on file  Social Connections: Not on file  Intimate Partner Violence: Not on file    Ms. Splitt's family history includes Arthritis in her brother; Breast cancer in an other family member; Breast cancer (age of onset: 67) in her brother; Colon polyps in her father;  Diabetes in her maternal grandmother; Heart disease in her father, maternal grandmother, and paternal grandfather; Hyperlipidemia in her father and maternal grandmother; Hypertension in her father; Melanoma in her father; Pancreatic cancer (age of onset: 52) in her paternal grandmother; Pancreatic cancer (age of onset: 64) in her father; Skin cancer in her cousin and maternal grandfather; Squamous cell carcinoma in her father.      Objective:    Vitals:   06/15/22 0834  BP: 126/72  Pulse: 80    Physical Exam Well-developed well-nourished WF  in no acute distress.  Height, Weight,206  BMI 35.9  HEENT; nontraumatic normocephalic, EOMI, PE R LA, sclera anicteric. Oropharynx;not examined today  Neck; supple, no JVD Cardiovascular; regular rate and  rhythm with S1-S2, no murmur rub or gallop Pulmonary; Clear bilaterally Abdomen; soft, nontender, nondistended, no palpable mass or hepatosplenomegaly, bowel sounds are active Rectal;not done today  Skin; benign exam, no jaundice rash or appreciable lesions Extremities; no clubbing cyanosis or edema skin warm and dry Neuro/Psych; alert and oriented x4, grossly nonfocal mood and affect appropriate        Assessment & Plan:   #57 51 year old white female with increased risk of pancreatic cancer with genetic predisposition and positive CDKN2A gene. Both her father and her paternal grandmother both had pancreatic cancer, gene is also associated for with increased risk of melanoma.  Normal EUS February 2020, normal MRI abdomen/MRCP February 2021 and March 2022.  Due for annual surveillance pancreatic imaging  Asymptomatic today  #2 history of adenomatous colon polyps-up-to-date with colonoscopy last done February 2020 with one 3 mm l sessile serrated polyp removed-indicated for 5-year interval follow-up  #3 GERD stable on omeprazole 20 mg p.o. daily #4 hypertension #5 diabetes mellitus  Plan; patient will be scheduled for MRI  abdomen/MRCP. We will plan to continue annual pancreatic imaging Plan follow-up colonoscopy February 2025 Further recommendations pending results of MRI.   Nicole Gonzalez S Orest Dygert PA-C 06/15/2022   Cc: Burnis Medin, MD

## 2022-06-15 NOTE — Progress Notes (Signed)
Addendum: Reviewed and agree with assessment and management plan. Jonathandavid Marlett M, MD  

## 2022-06-15 NOTE — Patient Instructions (Signed)
If you are age 52 or younger, your body mass index should be between 19-25. Your Body mass index is 35.92 kg/m. If this is out of the aformentioned range listed, please consider follow up with your Primary Care Provider.  ________________________________________________________  The [facility] providers would like to encourage you to use [facility] to communicate with providers for non-urgent requests or questions.  Due to long hold times on the telephone, sending your provider a message by [facility] may be a faster and more efficient way to get a response.  Please allow 48 business hours for a response.  Please remember that this is for non-urgent requests.  _______________________________________________________  Dennis Bast have been scheduled for an MRI at [facility] on 06/24/2022. Your appointment time is 9:00 am. Please arrive to admitting (at main entrance of the hospital) 30 minutes prior to your appointment time for registration purposes. Please make certain not to have anything to eat or drink 6 hours prior to your test. In addition, if you have any metal in your body, have a pacemaker or defibrillator, please be sure to let your ordering physician know. This test typically takes 45 minutes to 1 hour to complete. Should you need to reschedule, please call [phone] to do so.  We will send in Lorazepam for you to use 1 hour prior to your MRI.  Thank you for entrusting me with your care and choosing [facility].  Amy Esterwood, PA-C

## 2022-06-24 ENCOUNTER — Other Ambulatory Visit (HOSPITAL_COMMUNITY): Payer: BC Managed Care – PPO

## 2022-07-01 ENCOUNTER — Other Ambulatory Visit: Payer: Self-pay

## 2022-07-01 ENCOUNTER — Other Ambulatory Visit: Payer: Self-pay | Admitting: Physician Assistant

## 2022-07-01 ENCOUNTER — Ambulatory Visit (HOSPITAL_COMMUNITY)
Admission: RE | Admit: 2022-07-01 | Discharge: 2022-07-01 | Disposition: A | Discharge status: Home / Self Care | Payer: BC Managed Care – PPO | Source: Ambulatory Visit | Attending: Physician Assistant | Admitting: Physician Assistant

## 2022-07-01 DIAGNOSIS — Z1509 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: Secondary | ICD-10-CM | POA: Diagnosis not present

## 2022-07-01 DIAGNOSIS — K5730 Diverticulosis of large intestine without perforation or abscess without bleeding: Secondary | ICD-10-CM | POA: Diagnosis not present

## 2022-07-01 DIAGNOSIS — K760 Fatty (change of) liver, not elsewhere classified: Secondary | ICD-10-CM | POA: Diagnosis not present

## 2022-07-01 DIAGNOSIS — R160 Hepatomegaly, not elsewhere classified: Secondary | ICD-10-CM | POA: Diagnosis not present

## 2022-07-01 DIAGNOSIS — Z1501 Genetic susceptibility to malignant neoplasm of breast: Secondary | ICD-10-CM | POA: Insufficient documentation

## 2022-07-01 DIAGNOSIS — Z800 Family history of malignant neoplasm of digestive organs: Secondary | ICD-10-CM | POA: Diagnosis not present

## 2022-07-01 DIAGNOSIS — Z8507 Personal history of malignant neoplasm of pancreas: Secondary | ICD-10-CM | POA: Diagnosis not present

## 2022-07-01 MED ORDER — GADOBUTROL 1 MMOL/ML IV SOLN
9.0000 mL | Freq: Once | INTRAVENOUS | Status: AC | PRN
  Administered 2022-07-01: 9 mL via INTRAVENOUS

## 2022-07-18 NOTE — Progress Notes (Unsigned)
No chief complaint on file.   HPI: Nicole Gonzalez 51 y.o. come in for Chronic disease management  ROS: See pertinent positives and negatives per HPI.  Past Medical History:  Diagnosis Date   Anemia     better after hysterectomy   Cancer ([place])    basil cell carcinoma/forehead   Diabetes mellitus without complication ([place])    DIABETES MELLITUS, GESTATIONAL, HX OF 06/02/2009   Qualifier: Diagnosis of  By: Regis Bill MD, Standley Brooking    Diverticulosis    Family history of breast cancer    Family history of genetic disease carrier    father has CDKN2A and ATM mutation   Family history of melanoma    Family history of pancreatic cancer    Fracture of lower leg     fall 2011   GERD (gastroesophageal reflux disease)    History of basal cell carcinoma excision    History of gestational diabetes    x2   HTN (hypertension)    Hyperlipidemia    [id number] trx    Increased frequency of headaches 03/15/2012   Internal hemorrhoids    Obesity    Seizures ([place])    as an infant d/t fever   Tubular adenoma of colon    UNSPECIFIED TACHYCARDIA 09/01/2010   Qualifier: Diagnosis of  By: Charlett Blake MD, Erline Levine      Family History  Problem Relation Age of Onset   Melanoma Father        x 3 times, first in 94's   Hypertension Father    Hyperlipidemia Father    Squamous cell carcinoma Father    Colon polyps Father    Heart disease Father    Pancreatic cancer Father 76       CDKN2A muation   Arthritis Brother         Psoriatic   Breast cancer Brother 21       ATM + mutation? or VUS   Diabetes Maternal Grandmother    Hyperlipidemia Maternal Grandmother    Heart disease Maternal Grandmother    Skin cancer Maternal Grandfather    Pancreatic cancer Paternal Grandmother 90       met liver   Heart disease Paternal Grandfather    Skin cancer Cousin    Breast cancer Other    Colon cancer Neg Hx    Stomach cancer Neg Hx    Esophageal cancer Neg Hx     Social History   Socioeconomic History    Marital status: Married    Spouse name: Not on file   Number of children: 2   Years of education: Not on file   Highest education level: Not on file  Occupational History   Occupation: Barista: [employer]  Tobacco Use   Smoking status: Never   Smokeless tobacco: Never  Vaping Use   Vaping Use: Never used  Substance and Sexual Activity   Alcohol use: No   Drug use: No   Sexual activity: Not on file  Other Topics Concern   Not on file  Social History Narrative    Household of 4 -2 children   Son   to college     no pets     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    Nonsmoker married   Social Determinants of Radio broadcast assistant Strain: Not on file  Food Insecurity: Not on file  Transportation Needs: Not on file  Physical Activity: Not on file  Stress: Not on file  Social Connections: Not on file    Outpatient Medications Prior to Visit  Medication Sig Dispense Refill   BAYER CONTOUR NEXT TEST test strip Use as instructed 100 each 11   CONTOUR NEXT TEST test strip USE AS INSTRUCTED 100 strip 3   Cyanocobalamin (VITAMIN B-12) 1000 MCG SUBL Place 1 tablet (1,000 mcg total) under the tongue daily. 90 tablet 3   fexofenadine (ALLEGRA) 180 MG tablet Take 180 mg by mouth daily as needed (allergies.). In the morning      glucose blood (BAYER CONTOUR TEST) test strip Next strips Test 1-2 times daily Dx E11.9 100 each 12   ibuprofen (ADVIL,MOTRIN) 200 MG tablet Take 600 mg by mouth every 8 (eight) hours as needed (pain).     levocetirizine (XYZAL) 5 MG tablet Take 5 mg by mouth every evening.      lisinopril (ZESTRIL) 10 MG tablet TAKE 1 TABLET(10 MG) BY MOUTH DAILY FOR HIGH BLOOD PRESSURE 90 tablet 1   LORazepam (ATIVAN) 1 MG tablet Take 1 tablet (1 mg total) by mouth as directed. 1 hour prior to your MRI appointment. DO NOT drive while taking this medication 1 tablet 0   metFORMIN (GLUCOPHAGE-XR) 500 MG 24 hr tablet  TAKE 3 TABLETS BY MOUTH EVERY DAY WITH BREAKFAST 270 tablet 1   Microlet Lancets MISC USE TO CHECK BLOOD SUGAR ONE TO TWO TIMES DAILY 100 each 3   montelukast (SINGULAIR) 10 MG tablet Take 10 mg by mouth every evening.      Multiple Vitamin (MULTIVITAMIN WITH MINERALS) TABS tablet Take 1 tablet by mouth daily.     omeprazole (PRILOSEC) 20 MG capsule Take 20 mg by mouth every evening.      QNASL 80 MCG/ACT AERS Place 2 sprays into the nose every evening.      simvastatin (ZOCOR) 40 MG tablet TAKE 1 TABLET(40 MG) BY MOUTH DAILY AT 6 PM 90 tablet 1   No facility-administered medications prior to visit.     EXAM:  There were no vitals taken for this visit.  There is no height or weight on file to calculate BMI.  GENERAL: vitals reviewed and listed above, alert, oriented, appears well hydrated and in no acute distress HEENT: atraumatic, conjunctiva  clear, no obvious abnormalities on inspection of external nose and ears OP : no lesion edema or exudate  NECK: no obvious masses on inspection palpation  LUNGS: clear to auscultation bilaterally, no wheezes, rales or rhonchi, good air movement CV: HRRR, no clubbing cyanosis or  peripheral edema nl cap refill  MS: moves all extremities without noticeable focal  abnormality PSYCH: pleasant and cooperative, no obvious depression or anxiety Lab Results  Component Value Date   WBC 7.1 09/09/2021   HGB 13.4 09/09/2021   HCT 39.8 09/09/2021   PLT 200.0 09/09/2021   GLUCOSE 151 (H) 03/08/2022   CHOL 142 09/09/2021   TRIG 91.0 09/09/2021   HDL 46.50 09/09/2021   LDLDIRECT 172.4 10/02/2007   LDLCALC 77 09/09/2021   ALT 19 09/09/2021   AST 13 09/09/2021   NA 137 03/08/2022   K 4.3 03/08/2022   CL 101 03/08/2022   CREATININE 0.65 03/08/2022   BUN 17 03/08/2022   CO2 26 03/08/2022   TSH 2.17 03/08/2022   HGBA1C 7.4 (H) 03/08/2022   MICROALBUR 3.9 (H) 09/09/2021   BP Readings from Last 3 Encounters:  06/15/22 126/72  03/08/22 118/80  09/08/21 136/86    ASSESSMENT AND PLAN:  Discussed the following assessment and plan:  No diagnosis found.  -Patient advised to return or notify health care team  if  new concerns arise.  There are no Patient Instructions on file for this visit.   Standley Brooking. Ismerai Bin M.D.

## 2022-07-19 ENCOUNTER — Encounter: Payer: Self-pay | Admitting: Internal Medicine

## 2022-07-19 ENCOUNTER — Ambulatory Visit: Payer: BC Managed Care – PPO | Admitting: Internal Medicine

## 2022-07-19 VITALS — BP 118/82 | HR 86 | Temp 98.1°F | Wt 203.0 lb

## 2022-07-19 DIAGNOSIS — Z1509 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: Secondary | ICD-10-CM

## 2022-07-19 DIAGNOSIS — I10 Essential (primary) hypertension: Secondary | ICD-10-CM | POA: Diagnosis not present

## 2022-07-19 DIAGNOSIS — Z1501 Genetic susceptibility to malignant neoplasm of breast: Secondary | ICD-10-CM

## 2022-07-19 DIAGNOSIS — K760 Fatty (change of) liver, not elsewhere classified: Secondary | ICD-10-CM | POA: Diagnosis not present

## 2022-07-19 DIAGNOSIS — E1165 Type 2 diabetes mellitus with hyperglycemia: Secondary | ICD-10-CM

## 2022-07-19 DIAGNOSIS — Z6835 Body mass index (BMI) 35.0-35.9, adult: Secondary | ICD-10-CM

## 2022-07-19 DIAGNOSIS — Z79899 Other long term (current) drug therapy: Secondary | ICD-10-CM

## 2022-07-19 DIAGNOSIS — E785 Hyperlipidemia, unspecified: Secondary | ICD-10-CM

## 2022-07-19 DIAGNOSIS — Z800 Family history of malignant neoplasm of digestive organs: Secondary | ICD-10-CM

## 2022-07-19 LAB — LIPID PANEL
Cholesterol: 168 mg/dL (ref 0–200)
HDL: 49.3 mg/dL (ref >39.00)
LDL Cholesterol: 97 mg/dL (ref 0–99)
NonHDL: 118.24
Total CHOL/HDL Ratio: 3
Triglycerides: 105 mg/dL (ref 0.0–149.0)
VLDL: 21 mg/dL (ref 0.0–40.0)

## 2022-07-19 LAB — CBC WITH DIFFERENTIAL/PLATELET
Basophils Absolute: 0 10*3/uL (ref 0.0–0.1)
Basophils Relative: 0.3 % (ref 0.0–3.0)
Eosinophils Absolute: 0.1 10*3/uL (ref 0.0–0.7)
Eosinophils Relative: 2.3 % (ref 0.0–5.0)
HCT: 41.5 % (ref 36.0–46.0)
Hemoglobin: 13.9 g/dL (ref 12.0–15.0)
Lymphocytes Relative: 26.9 % (ref 12.0–46.0)
Lymphs Abs: 1.7 10*3/uL (ref 0.7–4.0)
MCHC: 33.6 g/dL (ref 30.0–36.0)
MCV: 87.5 fl (ref 78.0–100.0)
Monocytes Absolute: 0.4 10*3/uL (ref 0.1–1.0)
Monocytes Relative: 6.2 % (ref 3.0–12.0)
Neutro Abs: 4 10*3/uL (ref 1.4–7.7)
Neutrophils Relative %: 64.3 % (ref 43.0–77.0)
Platelets: 239 10*3/uL (ref 150.0–400.0)
RBC: 4.74 Mil/uL (ref 3.87–5.11)
RDW: 13 % (ref 11.5–15.5)
WBC: 6.1 10*3/uL (ref 4.0–10.5)

## 2022-07-19 LAB — HEPATIC FUNCTION PANEL
ALT: 26 U/L (ref 0–35)
ALT: 26 U/L (ref 0–35)
AST: 18 U/L (ref 0–37)
AST: 18 U/L (ref 0–37)
Albumin: 4.7 g/dL (ref 3.5–5.2)
Albumin: 4.7 g/dL (ref 3.5–5.2)
Alkaline Phosphatase: 58 U/L (ref 39–117)
Alkaline Phosphatase: 59 U/L (ref 39–117)
Bilirubin, Direct: 0.1 mg/dL (ref 0.0–0.3)
Bilirubin, Direct: 0.1 mg/dL (ref 0.0–0.3)
Total Bilirubin: 0.6 mg/dL (ref 0.2–1.2)
Total Bilirubin: 0.6 mg/dL (ref 0.2–1.2)
Total Protein: 7.3 g/dL (ref 6.0–8.3)
Total Protein: 7.4 g/dL (ref 6.0–8.3)

## 2022-07-19 LAB — MICROALBUMIN / CREATININE URINE RATIO
Creatinine,U: 32.7 mg/dL
Microalb Creat Ratio: 2.1 mg/g (ref 0.0–30.0)
Microalb, Ur: <0.7 mg/dL (ref 0.0–1.9)

## 2022-07-19 LAB — BASIC METABOLIC PANEL
BUN: 18 mg/dL (ref 6–23)
CO2: 26 mEq/L (ref 19–32)
Calcium: 9.7 mg/dL (ref 8.4–10.5)
Chloride: 101 mEq/L (ref 96–112)
Creatinine, Ser: 0.57 mg/dL (ref 0.40–1.20)
GFR: 104.52 mL/min (ref >60.00)
Glucose, Bld: 137 mg/dL — ABNORMAL HIGH (ref 70–99)
Potassium: 4.3 mEq/L (ref 3.5–5.1)
Sodium: 138 mEq/L (ref 135–145)

## 2022-07-19 LAB — HEMOGLOBIN A1C: Hgb A1c MFr Bld: 7.4 % — ABNORMAL HIGH (ref 4.6–6.5)

## 2022-07-19 LAB — TSH: TSH: 1.76 u[IU]/mL (ref 0.35–5.50)

## 2022-07-19 NOTE — Patient Instructions (Signed)
Good to see you  today . Nicole Gonzalez today  Consider inc metformin to max 2000 per day .   Consider other meds  such as glp1  as intervention with consultation. If needed.

## 2022-07-21 ENCOUNTER — Encounter: Payer: Self-pay | Admitting: Internal Medicine

## 2022-07-21 ENCOUNTER — Telehealth: Payer: Self-pay | Admitting: Internal Medicine

## 2022-07-21 MED ORDER — METFORMIN HCL ER 500 MG PO TB24: ORAL_TABLET | ORAL | 1 refills | Status: DC

## 2022-07-21 NOTE — Telephone Encounter (Signed)
Pt was scheduled for a 3 month FU and wanted to ask MD if she has to fast . Pt was scheduled for 10-25-22.  Please advise.

## 2022-07-21 NOTE — Progress Notes (Signed)
So a1c is  same .7.4  goal is below 7   Rest of labs ok   ldl could be better closer to 70  Consider adding meds and /or increase to 2000 mg per day of metformin  ( 4 x 500 per day )   Adding glp1 med oral or injectable as we discussed . Below  is a reference  looking at  pancreatic cancer incidence on meds .. so far  not associated cause   w increase in pancreatic cancer ( pancreas disease can be associated with dm  by itself)  Let me know what you think ( forwarding info to Dr Hilarie Fredrickson team) [email or web address]

## 2022-08-10 ENCOUNTER — Encounter: Payer: Self-pay | Admitting: Internal Medicine

## 2022-08-10 ENCOUNTER — Encounter: Payer: Self-pay | Admitting: Family

## 2022-08-10 ENCOUNTER — Telehealth (INDEPENDENT_AMBULATORY_CARE_PROVIDER_SITE_OTHER): Payer: BC Managed Care – PPO | Admitting: Family

## 2022-08-10 VITALS — Ht 63.5 in | Wt 205.0 lb

## 2022-08-10 DIAGNOSIS — R053 Chronic cough: Secondary | ICD-10-CM | POA: Diagnosis not present

## 2022-08-10 DIAGNOSIS — U071 COVID-19: Secondary | ICD-10-CM

## 2022-08-10 MED ORDER — BENZONATATE 200 MG PO CAPS: 200.0000 mg | ORAL_CAPSULE | Freq: Three times a day (TID) | ORAL | 0 refills | Status: AC | PRN

## 2022-08-10 NOTE — Progress Notes (Signed)
MyChart Video Visit    Virtual Visit via Video Note   This format is felt to be most appropriate for this patient at this time. Physical exam was limited by quality of the video and audio technology used for the visit. CMA was able to get the patient set up on a video visit.  Patient location: Home. Patient and provider in visit Provider location: Office  I discussed the limitations of evaluation and management by telemedicine and the availability of in person appointments. The patient expressed understanding and agreed to proceed.  Visit Date: 08/10/2022  Today's healthcare provider: Jeanie Sewer, NP     Subjective:   Patient ID: Nicole Gonzalez, female    DOB: 1971-09-05, 51 y.o.   MRN: [id number]  Chief Complaint  Patient presents with   Covid Positive    Symptoms started on Friday, Tested positive for Covid on Saturday. Symptoms include low fever,fatigue, sore throat, cough, throwing up after. Pt states all of her symptoms have went away except for the cough.    HPI Upper Respiratory Infection: Symptoms include  fever, fatigue, sore throat, cough with gag reflex causing vomiting yesterday. .  Onset of symptoms was 5 days ago, gradually improving since that time, just her cough is still bad. She is drinking moderate amounts of fluids. Evaluation to date: none. Treatment to date:  taking her daily sinus/allergy meds . Reports OTC cough syrups make her cough worse d/t sugary content.   Assessment & Plan:   Problem List Items Addressed This Visit   None Visit Diagnoses     COVID-19    -  Primary advised on conservative tx due to number of days with sx, advised to increase her Qnasl spray to bid for next 4-5d to decrease any postnasal drip possibly contributing to cough. Hydrate well.    Persistent cough    - sending Tessalon pearles, advised on use & SE. Continue tea w/honey, cough drops prn, drink at least 2L of water qd. Call back if cough not resolving in  another week.    Relevant Medications   benzonatate (TESSALON) 200 MG capsule      Past Medical History:  Diagnosis Date   Anemia     better after hysterectomy   Cancer ([place])    basil cell carcinoma/forehead   Diabetes mellitus without complication ([place])    DIABETES MELLITUS, GESTATIONAL, HX OF 06/02/2009   Qualifier: Diagnosis of  By: Regis Bill MD, Standley Brooking    Diverticulosis    Family history of breast cancer    Family history of genetic disease carrier    father has CDKN2A and ATM mutation   Family history of melanoma    Family history of pancreatic cancer    Fracture of lower leg     fall 2011   GERD (gastroesophageal reflux disease)    History of basal cell carcinoma excision    History of gestational diabetes    x2   HTN (hypertension)    Hyperlipidemia    [id number] 043 trx    Increased frequency of headaches 03/15/2012   Internal hemorrhoids    Obesity    Seizures ([place])    as an infant d/t fever   Tubular adenoma of colon    UNSPECIFIED TACHYCARDIA 09/01/2010   Qualifier: Diagnosis of  By: Charlett Blake MD, Erline Levine      Past Surgical History:  Procedure Laterality Date   ABDOMINAL HYSTERECTOMY  06/29/2010    question had abnormal cells on Path, fibroids  BASAL CELL CARCINOMA EXCISION     BREAST BIOPSY Right 2009   COLONOSCOPY WITH PROPOFOL N/A 01/17/2019   Procedure: COLONOSCOPY WITH PROPOFOL;  Surgeon: Milus Banister, MD;  Location: [facility];  Service: Endoscopy;  Laterality: N/A;   ESOPHAGOGASTRODUODENOSCOPY (EGD) WITH PROPOFOL N/A 01/17/2019   Procedure: ESOPHAGOGASTRODUODENOSCOPY (EGD) WITH PROPOFOL;  Surgeon: Milus Banister, MD;  Location: [facility];  Service: Endoscopy;  Laterality: N/A;   EUS N/A 01/17/2019   Procedure: UPPER ENDOSCOPIC ULTRASOUND (EUS) RADIAL;  Surgeon: Milus Banister, MD;  Location: [facility];  Service: Endoscopy;  Laterality: N/A;   POLYPECTOMY  01/17/2019   Procedure: POLYPECTOMY;  Surgeon: Milus Banister, MD;  Location: [facility];   Service: Endoscopy;;   TONSILLECTOMY      Outpatient Medications Prior to Visit  Medication Sig Dispense Refill   BAYER CONTOUR NEXT TEST test strip Use as instructed 100 each 11   CONTOUR NEXT TEST test strip USE AS INSTRUCTED 100 strip 3   Cyanocobalamin (VITAMIN B-12) 1000 MCG SUBL Place 1 tablet (1,000 mcg total) under the tongue daily. 90 tablet 3   fexofenadine (ALLEGRA) 180 MG tablet Take 180 mg by mouth daily as needed (allergies.). In the morning      glucose blood (BAYER CONTOUR TEST) test strip Next strips Test 1-2 times daily Dx E11.9 100 each 12   ibuprofen (ADVIL,MOTRIN) 200 MG tablet Take 600 mg by mouth every 8 (eight) hours as needed (pain).     levocetirizine (XYZAL) 5 MG tablet Take 5 mg by mouth every evening.      lisinopril (ZESTRIL) 10 MG tablet TAKE 1 TABLET(10 MG) BY MOUTH DAILY FOR HIGH BLOOD PRESSURE 90 tablet 1   LORazepam (ATIVAN) 1 MG tablet Take 1 tablet (1 mg total) by mouth as directed. 1 hour prior to your MRI appointment. DO NOT drive while taking this medication 1 tablet 0   metFORMIN (GLUCOPHAGE-XR) 500 MG 24 hr tablet Take 4 tablets daily 360 tablet 1   Microlet Lancets MISC USE TO CHECK BLOOD SUGAR ONE TO TWO TIMES DAILY 100 each 3   montelukast (SINGULAIR) 10 MG tablet Take 10 mg by mouth every evening.      Multiple Vitamin (MULTIVITAMIN WITH MINERALS) TABS tablet Take 1 tablet by mouth daily.     omeprazole (PRILOSEC) 20 MG capsule Take 20 mg by mouth every evening.      QNASL 80 MCG/ACT AERS Place 2 sprays into the nose every evening.      simvastatin (ZOCOR) 40 MG tablet TAKE 1 TABLET(40 MG) BY MOUTH DAILY AT 6 PM 90 tablet 1   No facility-administered medications prior to visit.    Allergies  Allergen Reactions   Contrast Media [Iodinated Contrast Media] Other (See Comments)    ORAL and IV Fever and itchy mouth ORAL and IV Fever and itchy mouth   Adhesive [Tape]    Other     Adhesive, Metals   Stadol [Butorphanol]        Objective:    Physical Exam Vitals and nursing note reviewed.  Constitutional:      General: She is not in acute distress.    Appearance: Normal appearance.  HENT:     Head: Normocephalic.  Pulmonary:     Effort: No respiratory distress.  Musculoskeletal:     Cervical back: Normal range of motion.  Skin:    General: Skin is dry.     Coloration: Skin is not pale.  Neurological:     Mental  Status: She is alert and oriented to person, place, and time.  Psychiatric:        Mood and Affect: Mood normal.   Ht 5' 3.5" (1.613 m)   Wt 205 lb (93 kg)   BMI 35.74 kg/m   Wt Readings from Last 3 Encounters:  08/10/22 205 lb (93 kg)  07/19/22 203 lb (92.1 kg)  06/15/22 206 lb (93.4 kg)      I discussed the assessment and treatment plan with the patient. The patient was provided an opportunity to ask questions and all were answered. The patient agreed with the plan and demonstrated an understanding of the instructions.   The patient was advised to call back or seek an in-person evaluation if the symptoms worsen or if the condition fails to improve as anticipated.  Jeanie Sewer, NP [place] [phone] (phone) [phone] (fax)  [place]

## 2022-08-28 ENCOUNTER — Other Ambulatory Visit: Payer: Self-pay | Admitting: Internal Medicine

## 2022-10-07 ENCOUNTER — Ambulatory Visit: Payer: BC Managed Care – PPO

## 2022-10-25 ENCOUNTER — Ambulatory Visit: Payer: BC Managed Care – PPO | Admitting: Internal Medicine

## 2022-10-25 ENCOUNTER — Encounter: Payer: Self-pay | Admitting: Internal Medicine

## 2022-10-25 VITALS — BP 116/78 | HR 86 | Temp 97.6°F | Wt 199.0 lb

## 2022-10-25 DIAGNOSIS — Z6835 Body mass index (BMI) 35.0-35.9, adult: Secondary | ICD-10-CM

## 2022-10-25 DIAGNOSIS — E1165 Type 2 diabetes mellitus with hyperglycemia: Secondary | ICD-10-CM

## 2022-10-25 DIAGNOSIS — K760 Fatty (change of) liver, not elsewhere classified: Secondary | ICD-10-CM

## 2022-10-25 DIAGNOSIS — Z79899 Other long term (current) drug therapy: Secondary | ICD-10-CM | POA: Diagnosis not present

## 2022-10-25 LAB — POCT GLYCOSYLATED HEMOGLOBIN (HGB A1C): Hemoglobin A1C: 6.9 % — AB (ref 4.0–5.6)

## 2022-10-25 MED ORDER — TIRZEPATIDE 2.5 MG/0.5ML ~~LOC~~ SOAJ: 2.5000 mg | SUBCUTANEOUS | 1 refills | Status: DC

## 2022-10-25 NOTE — Patient Instructions (Addendum)
A1c is 6.9  Beginning mounjaro once a week starter dose.   Give [place] update after a month about weight and how tolerating  to decide if will do a step up dose . Otherwise Continue lifestyle intervention healthy eating and exercise .   And Appt. ROV in 3 months .    Consider getting  PRevnar20  update.

## 2022-10-25 NOTE — Progress Notes (Signed)
Chief Complaint  Patient presents with   Follow-up   Diabetes    HPI: Nicole Gonzalez 51 y.o. come in for Chronic disease management  Last visis August  and got covid  also 8 30  like a could cough and better  DM:  out of eating range recnetly  taking metformin  needs 90 days with refills  Obesity  cautious about  meds but still working on it  [employer] liver  Genetic mutation and fam hx of cancer  Gi following  Work Automotive engineer loss  support. Steps.   No new stugg.  Bp Lisinopril  ok readings HLD: simvastatin  40   ROS: See pertinent positives and negatives per HPI. No neuropathy major change health  Past Medical History:  Diagnosis Date   Anemia     better after hysterectomy   Cancer ([place])    basil cell carcinoma/forehead   Diabetes mellitus without complication ([place])    DIABETES MELLITUS, GESTATIONAL, HX OF 06/02/2009   Qualifier: Diagnosis of  By: Regis Bill MD, Standley Brooking    Diverticulosis    Family history of breast cancer    Family history of genetic disease carrier    father has CDKN2A and ATM mutation   Family history of melanoma    Family history of pancreatic cancer    Fracture of lower leg     fall 2011   GERD (gastroesophageal reflux disease)    History of basal cell carcinoma excision    History of gestational diabetes    x2   HTN (hypertension)    Hyperlipidemia    [id number] 043 trx    Increased frequency of headaches 03/15/2012   Internal hemorrhoids    Obesity    Seizures ([place])    as an infant d/t fever   Tubular adenoma of colon    UNSPECIFIED TACHYCARDIA 09/01/2010   Qualifier: Diagnosis of  By: Charlett Blake MD, Erline Levine      Family History  Problem Relation Age of Onset   Melanoma Father        x 3 times, first in 19's   Hypertension Father    Hyperlipidemia Father    Squamous cell carcinoma Father    Colon polyps Father    Heart disease Father    Pancreatic cancer Father 26       CDKN2A muation   Arthritis Brother         Psoriatic   Breast cancer  Brother 30       ATM + mutation? or VUS   Diabetes Maternal Grandmother    Hyperlipidemia Maternal Grandmother    Heart disease Maternal Grandmother    Skin cancer Maternal Grandfather    Pancreatic cancer Paternal Grandmother 58       met liver   Heart disease Paternal Grandfather    Skin cancer Cousin    Breast cancer Other    Colon cancer Neg Hx    Stomach cancer Neg Hx    Esophageal cancer Neg Hx     Social History   Socioeconomic History   Marital status: Married    Spouse name: Not on file   Number of children: 2   Years of education: Not on file   Highest education level: Not on file  Occupational History   Occupation: Barista: [employer]  Tobacco Use   Smoking status: Never   Smokeless tobacco: Never  Vaping Use   Vaping Use: Never used  Substance and Sexual Activity   Alcohol use: No   Drug use: No   Sexual activity: Not on file  Other Topics Concern   Not on file  Social History Narrative    Household of 4 -2 children   Son   to college     no pets     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    Nonsmoker married   Social Determinants of Radio broadcast assistant Strain: Not on file  Food Insecurity: Not on file  Transportation Needs: Not on file  Physical Activity: Not on file  Stress: Not on file  Social Connections: Not on file    Outpatient Medications Prior to Visit  Medication Sig Dispense Refill   BAYER CONTOUR NEXT TEST test strip Use as instructed 100 each 11   CONTOUR NEXT TEST test strip USE AS INSTRUCTED 100 strip 3   Cyanocobalamin (VITAMIN B-12) 1000 MCG SUBL Place 1 tablet (1,000 mcg total) under the tongue daily. 90 tablet 3   fexofenadine (ALLEGRA) 180 MG tablet Take 180 mg by mouth daily as needed (allergies.). In the morning      glucose blood (BAYER CONTOUR TEST) test strip Next strips Test 1-2 times daily Dx E11.9 100 each 12   ibuprofen (ADVIL,MOTRIN) 200 MG tablet  Take 600 mg by mouth every 8 (eight) hours as needed (pain).     levocetirizine (XYZAL) 5 MG tablet Take 5 mg by mouth every evening.      lisinopril (ZESTRIL) 10 MG tablet TAKE 1 TABLET(10 MG) BY MOUTH DAILY FOR HIGH BLOOD PRESSURE 90 tablet 1   LORazepam (ATIVAN) 1 MG tablet Take 1 tablet (1 mg total) by mouth as directed. 1 hour prior to your MRI appointment. DO NOT drive while taking this medication 1 tablet 0   metFORMIN (GLUCOPHAGE-XR) 500 MG 24 hr tablet Take 4 tablets daily 360 tablet 1   Microlet Lancets MISC USE TO CHECK BLOOD SUGAR ONE TO TWO TIMES DAILY 100 each 3   montelukast (SINGULAIR) 10 MG tablet Take 10 mg by mouth every evening.      Multiple Vitamin (MULTIVITAMIN WITH MINERALS) TABS tablet Take 1 tablet by mouth daily.     omeprazole (PRILOSEC) 20 MG capsule Take 20 mg by mouth every evening.      QNASL 80 MCG/ACT AERS Place 2 sprays into the nose every evening.      simvastatin (ZOCOR) 40 MG tablet TAKE 1 TABLET(40 MG) BY MOUTH DAILY AT 6 PM 90 tablet 1   No facility-administered medications prior to visit.     EXAM:  BP 116/78 (BP Location: Right Arm, Patient Position: Sitting, Cuff Size: Large)   Pulse 86   Temp 97.6 F (36.4 C) (Oral)   Wt 199 lb (90.3 kg)   SpO2 94%   BMI 34.70 kg/m   Body mass index is 34.7 kg/m.  GENERAL: vitals reviewed and listed above, alert, oriented, appears well hydrated and in no acute distress HEENT: atraumatic, conjunctiva  clear, no obvious abnormalities on inspection of external nose and ears Looks well  PSYCH: pleasant and cooperative, no obvious depression or anxiety Lab Results  Component Value Date   WBC 6.1 07/19/2022   HGB 13.9 07/19/2022   HCT 41.5 07/19/2022   PLT 239.0 07/19/2022   GLUCOSE 137 (H) 07/19/2022   CHOL 168 07/19/2022   TRIG 105.0 07/19/2022   HDL 49.30 07/19/2022   LDLDIRECT 172.4 10/02/2007  LDLCALC 97 07/19/2022   ALT 26 07/19/2022   ALT 26 07/19/2022   AST 18 07/19/2022   AST 18  07/19/2022   NA 138 07/19/2022   K 4.3 07/19/2022   CL 101 07/19/2022   CREATININE 0.57 07/19/2022   BUN 18 07/19/2022   CO2 26 07/19/2022   TSH 1.76 07/19/2022   HGBA1C 6.9 (A) 10/25/2022   MICROALBUR <0.7 07/19/2022   BP Readings from Last 3 Encounters:  10/25/22 116/78  07/19/22 118/82  06/15/22 126/72    ASSESSMENT AND PLAN:  Discussed the following assessment and plan:  Type 2 diabetes mellitus with hyperglycemia, without long-term current use of insulin ([place]) - Plan: POC HgB A1c  Medication management  Fatty infiltration of liver  BMI 35.0-35.9,adult Lab review  A1c improved but still a candidate for GLP1 with fatty liver changes and dysmetabolism conditions. Will order mounjaro  at this time  expectant management with delay and approvals  can try semiglutide but at this time shortages of supply seem universal  See Below. Dr Hilarie Fredrickson did not see a  sig risk with trying a glp 1 as is under monitoring -Patient advised to return or notify health care team  if  new concerns arise. 34 minutes review disc counsel and plan    Patient Instructions  A1c is 6.9  Beginning mounjaro once a week starter dose.   Give [place] update after a month about weight and how tolerating  to decide if will do a step up dose . Otherwise Continue lifestyle intervention healthy eating and exercise .   And Appt. ROV in 3 months .    Consider getting  PRevnar20  update.     Standley Brooking. Lorrain Rivers M.D.

## 2022-11-11 ENCOUNTER — Other Ambulatory Visit: Payer: Self-pay | Admitting: Internal Medicine

## 2022-11-11 DIAGNOSIS — Z1231 Encounter for screening mammogram for malignant neoplasm of breast: Secondary | ICD-10-CM

## 2022-11-16 ENCOUNTER — Telehealth: Payer: Self-pay

## 2022-11-17 NOTE — Telephone Encounter (Signed)
PA for Mounjaro 2.'5mg'$ /0.60m submitted to [employer]. [id number] Approved from 11/16/2022 through 11/15/2023.  Spoke to [employer] run it through. He states to wait maybe 6 days before runs out. Currently it is stating PA is still need to be done.

## 2022-11-24 ENCOUNTER — Telehealth: Payer: Self-pay | Admitting: Internal Medicine

## 2022-11-24 NOTE — Telephone Encounter (Signed)
error 

## 2022-11-24 NOTE — Telephone Encounter (Signed)
Pt is currently taking & is requesting a refill of the: tirzepatide [facility]) 2.5 MG/0.5ML Pen  Insurance is giving her a hard time. Insurance wants her to go up to a 5.  MD wanted Pt to stay on 2.5.  Pt is asking what to do?  Pt understands MD is out of the office indefinitely. Pt is asking if another MD could look into this and send appropriate refill? Pt has BC/BS.  LOV:  10/25/22   Please advise.  [facility] [place], [place] - [address] [place] [address] [place] [address] Phone: [phone]  Fax: [phone]

## 2022-11-25 ENCOUNTER — Other Ambulatory Visit: Payer: Self-pay

## 2022-11-25 DIAGNOSIS — E1165 Type 2 diabetes mellitus with hyperglycemia: Secondary | ICD-10-CM

## 2022-11-25 MED ORDER — TIRZEPATIDE 5 MG/0.5ML ~~LOC~~ SOAJ: 5.0000 mg | SUBCUTANEOUS | 1 refills | Status: DC

## 2022-11-25 NOTE — Telephone Encounter (Addendum)
Called and spoke with patient she stated that Yes she took the 2.5 mg for 1 month. Tirzepatide '5mg'$  sent to [facility].   Called patient informed prescription is sent  to [facility].

## 2022-11-30 ENCOUNTER — Other Ambulatory Visit (HOSPITAL_COMMUNITY): Payer: Self-pay

## 2022-12-14 ENCOUNTER — Emergency Department (HOSPITAL_BASED_OUTPATIENT_CLINIC_OR_DEPARTMENT_OTHER)
Admission: EM | Admit: 2022-12-14 | Discharge: 2022-12-15 | Disposition: A | Discharge status: Home / Self Care | Payer: BC Managed Care – PPO | Attending: Emergency Medicine | Admitting: Emergency Medicine

## 2022-12-14 ENCOUNTER — Other Ambulatory Visit: Payer: Self-pay

## 2022-12-14 ENCOUNTER — Encounter (HOSPITAL_BASED_OUTPATIENT_CLINIC_OR_DEPARTMENT_OTHER): Payer: Self-pay

## 2022-12-14 ENCOUNTER — Telehealth: Payer: BC Managed Care – PPO | Admitting: Family

## 2022-12-14 DIAGNOSIS — H5789 Other specified disorders of eye and adnexa: Secondary | ICD-10-CM | POA: Insufficient documentation

## 2022-12-14 DIAGNOSIS — J309 Allergic rhinitis, unspecified: Secondary | ICD-10-CM | POA: Diagnosis not present

## 2022-12-14 DIAGNOSIS — H1033 Unspecified acute conjunctivitis, bilateral: Secondary | ICD-10-CM | POA: Diagnosis not present

## 2022-12-14 DIAGNOSIS — H109 Unspecified conjunctivitis: Secondary | ICD-10-CM | POA: Diagnosis not present

## 2022-12-14 MED ORDER — POLYMYXIN B-TRIMETHOPRIM 10000-0.1 UNIT/ML-% OP SOLN: 1.0000 [drp] | Freq: Four times a day (QID) | OPHTHALMIC | 0 refills | Status: DC

## 2022-12-14 NOTE — Patient Instructions (Signed)
Bacterial Conjunctivitis, Adult ?Bacterial conjunctivitis is an infection of the clear membrane that covers the white part of the eye and the inner surface of the eyelid (conjunctiva). When the blood vessels in the conjunctiva become inflamed, the eye becomes red or pink. The eye often feels irritated or itchy. Bacterial conjunctivitis spreads easily from person to person (is contagious). It also spreads easily from one eye to the other eye. ?What are the causes? ?This condition is caused by bacteria. You may get the infection if you come into close contact with: ?A person who is infected with the bacteria. ?Items that are contaminated with the bacteria, such as a face towel, contact lens solution, or eye makeup. ?What increases the risk? ?You are more likely to develop this condition if: ?You are exposed to other people who have the infection. ?You wear contact lenses. ?You have a sinus infection. ?You have had a recent eye injury or surgery. ?You have a weak body defense system (immune system). ?You have a medical condition that causes dry eyes. ?What are the signs or symptoms? ?Symptoms of this condition include: ?Thick, yellowish discharge from the eye. This may turn into a crust on the eyelid overnight and cause your eyelids to stick together. ?Tearing or watery eyes. ?Itchy eyes. ?Burning feeling in your eyes. ?Eye redness. ?Swollen eyelids. ?Blurred vision. ?How is this diagnosed? ?This condition is diagnosed based on your symptoms and medical history. Your health care provider may also take a sample of discharge from your eye to find the cause of your infection. ?How is this treated? ?This condition may be treated with: ?Antibiotic eye drops or ointment to clear the infection more quickly and prevent the spread of infection to others. ?Antibiotic medicines taken by mouth (orally) to treat infections that do not respond to drops or ointments or that last longer than 10 days. ?Cool, wet cloths (cool  compresses) placed on the eyes. ?Artificial tears applied 2-6 times a day. ?Follow these instructions at home: ?Medicines ?Take or apply your antibiotic medicine as told by your health care provider. Do not stop using the antibiotic, even if your condition improves, unless directed by your health care provider. ?Take or apply over-the-counter and prescription medicines only as told by your health care provider. ?Be very careful to avoid touching the edge of your eyelid with the eye-drop bottle or the ointment tube when you apply medicines to the affected eye. This will keep you from spreading the infection to your other eye or to other people. ?Managing discomfort ?Gently wipe away any drainage from your eye with a warm, wet washcloth or a cotton ball. ?Apply a clean, cool compress to your eye for 10-20 minutes, 3-4 times a day. ?General instructions ?Do not wear contact lenses until the inflammation is gone and your health care provider says it is safe to wear them again. Ask your health care provider how to sterilize or replace your contact lenses before you use them again. Wear glasses until you can resume wearing contact lenses. ?Avoid wearing eye makeup until the inflammation is gone. Throw away any old eye cosmetics that may be contaminated. ?Change or wash your pillowcase every day. ?Do not share towels or washcloths. This may spread the infection. ?Wash your hands often with soap and water for at least 20 seconds and especially before touching your face or eyes. Use paper towels to dry your hands. ?Avoid touching or rubbing your eyes. ?Do not drive or use heavy machinery if your vision is blurred. ?Contact   a health care provider if: ?You have a fever. ?Your symptoms do not get better after 10 days. ?Get help right away if: ?You have a fever and your symptoms suddenly get worse. ?You have severe pain when you move your eye. ?You have facial pain, redness, or swelling. ?You have a sudden loss of  vision. ?Summary ?Bacterial conjunctivitis is an infection of the clear membrane that covers the white part of the eye and the inner surface of the eyelid (conjunctiva). ?Bacterial conjunctivitis spreads easily from eye to eye and from person to person (is contagious). ?Wash your hands often with soap and water for at least 20 seconds and especially before touching your face or eyes. Use paper towels to dry your hands. ?Take or apply your antibiotic medicine as told by your health care provider. Do not stop using the antibiotic even if your condition improves. ?Contact a health care provider if you have a fever or if your symptoms do not get better after 10 days. Get help right away if you have a sudden loss of vision. ?This information is not intended to replace advice given to you by your health care provider. Make sure you discuss any questions you have with your health care provider. ?Document Revised: 03/10/2021 Document Reviewed: 03/10/2021 ?Elsevier Patient Education ? 2023 Elsevier Inc. ? ?

## 2022-12-14 NOTE — ED Triage Notes (Signed)
Patient here POV from Home.  Endorses Bilateral Eye Redness and Itching that began this AM. Associated with Some Mild Clear Drainage.   Given Prescription Eyedrops through E-Visit today but States it has worsened progressively over the past 10-12 hours. Uses Prescription Glasses. No Vision Changes.   NAD Noted during Triage. A&OX4. GCS 15. Ambulatory.

## 2022-12-14 NOTE — Progress Notes (Signed)
Virtual Visit Consent   Nicole Gonzalez, you are scheduled for a virtual visit with a [name] provider today. Just as with appointments in the office, your consent must be obtained to participate. Your consent will be active for this visit and any virtual visit you may have with one of our providers in the next 365 days. If you have a MyChart account, a copy of this consent can be sent to you electronically.  As this is a virtual visit, video technology does not allow for your provider to perform a traditional examination. This may limit your provider's ability to fully assess your condition. If your provider identifies any concerns that need to be evaluated in person or the need to arrange testing (such as labs, EKG, etc.), we will make arrangements to do so. Although advances in technology are sophisticated, we cannot ensure that it will always work on either your end or our end. If the connection with a video visit is poor, the visit may have to be switched to a telephone visit. With either a video or telephone visit, we are not always able to ensure that we have a secure connection.  By engaging in this virtual visit, you consent to the provision of healthcare and authorize for your insurance to be billed (if applicable) for the services provided during this visit. Depending on your insurance coverage, you may receive a charge related to this service.  I need to obtain your verbal consent now. Are you willing to proceed with your visit today? [name] has provided verbal consent on 12/14/2022 for a virtual visit (video or telephone). [name], FNP  Date: 12/14/2022 12:47 PM  Virtual Visit via Video Note   I, [name], connected with  Nicole Gonzalez  ([id number], 12-29-1970) on 12/14/22 at 12:45 PM EST by a video-enabled telemedicine application and verified that I am speaking with the correct person using two identifiers.  Location: Patient: Virtual Visit Location Patient: Other:  work Provider: Ecologist: Home Office   I discussed the limitations of evaluation and management by telemedicine and the availability of in person appointments. The patient expressed understanding and agreed to proceed.    History of Present Illness: Nicole Gonzalez is a 52 y.o. who identifies as a female who was assigned female at birth, and is being seen today for drainage for three weeks. She reports she woke up this AM and her left eye was red.   HPI: URI  This is a new problem. The current episode started 1 to 4 weeks ago. The problem has been unchanged. There has been no fever. Associated symptoms include congestion, coughing, headaches, rhinorrhea, sinus pain and sneezing. Pertinent negatives include no chest pain, ear pain or rash. She has tried antihistamine for the symptoms. The treatment provided mild relief.  Conjunctivitis  The current episode started today. The onset was sudden. The problem occurs continuously. The problem has been unchanged. The problem is mild. Associated symptoms include congestion, headaches, rhinorrhea, cough, URI, eye discharge, eye pain and eye redness. Pertinent negatives include no decreased vision, no double vision, no eye itching, no ear pain and no rash. The left eye is affected.    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   Hyperlipidemia 03/27/2015   Family history of breast cancer in female 03/27/2015   Rhinitis, allergic 10/16/2014   Left maxillary sinusitis 10/16/2014   Prediabetes 09/24/2014   Plantar fasciitis 10/22/2013   Abdominal swelling, mass, or lump felt by patient  03/26/2013   Leg length discrepancy 03/22/2013   Newly diagnosed diabetes ([place]) 03/22/2013   Abdominal pain, chronic, right lower quadrant 03/22/2013   History of basal cell carcinoma excision    Visit for preventive health examination 03/15/2012   Hx of abdominal pain 03/15/2012   Anemia    OBESITY 07/15/2010   RHINITIS 07/15/2010   HYPERGLYCEMIA 07/15/2010   Essential hypertension 06/02/2009   DIABETES MELLITUS, GESTATIONAL, HX OF 06/02/2009   KNEE PAIN 06/03/2008   HYPERLIPIDEMIA 10/09/2007   GERD 10/09/2007   HEMATURIA 10/09/2007   HIDRADENITIS 10/09/2007    Allergies:  Allergies  Allergen Reactions   Contrast Media [Iodinated Contrast Media] Other (See Comments)    ORAL and IV Fever and itchy mouth ORAL and IV Fever and itchy mouth   Adhesive [Tape]    Other     Adhesive, Metals   Stadol [Butorphanol]    Medications:  Current Outpatient Medications:    trimethoprim-polymyxin b (POLYTRIM) ophthalmic solution, Place 1 drop into the left eye every 6 (six) hours., Disp: 10 mL, Rfl: 0   BAYER CONTOUR NEXT TEST test strip, Use as instructed, Disp: 100 each, Rfl: 11   CONTOUR NEXT TEST test strip, USE AS INSTRUCTED, Disp: 100 strip, Rfl: 3   Cyanocobalamin (VITAMIN B-12) 1000 MCG SUBL, Place 1 tablet (1,000 mcg total) under the tongue daily., Disp: 90 tablet, Rfl: 3   fexofenadine (ALLEGRA) 180 MG tablet, Take 180 mg by mouth daily as needed (allergies.). In the morning , Disp: , Rfl:    glucose blood (BAYER CONTOUR TEST) test strip, Next strips Test 1-2 times daily Dx E11.9, Disp: 100 each, Rfl: 12   ibuprofen (ADVIL,MOTRIN) 200 MG tablet, Take 600 mg by mouth every 8 (eight) hours as needed (pain)., Disp: , Rfl:    levocetirizine (XYZAL) 5 MG tablet, Take 5 mg by mouth every evening. , Disp: , Rfl:    lisinopril (ZESTRIL) 10 MG tablet, TAKE 1 TABLET(10 MG) BY MOUTH DAILY FOR HIGH BLOOD PRESSURE, Disp: 90 tablet, Rfl: 1   LORazepam (ATIVAN) 1 MG tablet, Take 1 tablet (1 mg  total) by mouth as directed. 1 hour prior to your MRI appointment. DO NOT drive while taking this medication, Disp: 1 tablet, Rfl: 0   metFORMIN (GLUCOPHAGE-XR) 500 MG 24 hr tablet, Take 4 tablets daily, Disp: 360 tablet, Rfl: 1   Microlet Lancets MISC, USE TO CHECK BLOOD SUGAR ONE TO TWO TIMES DAILY, Disp: 100 each, Rfl: 3   montelukast (SINGULAIR) 10 MG tablet, Take 10 mg by mouth every evening. , Disp: , Rfl:    Multiple Vitamin (MULTIVITAMIN WITH MINERALS) TABS tablet, Take 1 tablet by mouth daily., Disp: , Rfl:    omeprazole (PRILOSEC) 20 MG capsule, Take 20 mg by mouth every evening. , Disp: , Rfl:    QNASL 80 MCG/ACT AERS, Place 2 sprays into the nose every evening. , Disp: , Rfl:    simvastatin (ZOCOR) 40 MG tablet, TAKE 1 TABLET(40 MG) BY MOUTH DAILY AT 6 PM, Disp: 90 tablet, Rfl: 1   tirzepatide (MOUNJARO) 5 MG/0.5ML Pen,  Inject 5 mg into the skin once a week., Disp: 2 mL, Rfl: 1  Observations/Objective: Patient is well-developed, well-nourished in no acute distress.  Resting comfortably  at home.  Head is normocephalic, atraumatic.  No labored breathing.  Speech is clear and coherent with logical content.  Patient is alert and oriented at baseline.  Left eye slightly erythemas   Assessment and Plan: 1. Bacterial conjunctivitis - trimethoprim-polymyxin b (POLYTRIM) ophthalmic solution; Place 1 drop into the left eye every 6 (six) hours.  Dispense: 10 mL; Refill: 0  2. Allergic rhinitis, unspecified seasonality, unspecified trigger  Keep clean and dry Avoid rubbing eye Good hand hygiene  Continue allergy medications  Follow up if symptoms worsen or do not improve   Follow Up Instructions: I discussed the assessment and treatment plan with the patient. The patient was provided an opportunity to ask questions and all were answered. The patient agreed with the plan and demonstrated an understanding of the instructions.  A copy of instructions were sent to the patient via MyChart  unless otherwise noted below.     The patient was advised to call back or seek an in-person evaluation if the symptoms worsen or if the condition fails to improve as anticipated.  Time:  I spent 8 minutes with the patient via telehealth technology discussing the above problems/concerns.    [name], FNP

## 2022-12-15 MED ORDER — OLOPATADINE HCL 0.1 % OP SOLN
1.0000 [drp] | Freq: Once | OPHTHALMIC | Status: DC
  Filled 2022-12-15: qty 5

## 2022-12-15 MED ORDER — OLOPATADINE HCL 0.2 % OP SOLN: 1.0000 [drp] | Freq: Every day | OPHTHALMIC | 0 refills | Status: DC

## 2022-12-15 NOTE — Discharge Instructions (Addendum)
Keep eyes clean and dry.  You can use compresses as needed.  Please wash your hands frequently.

## 2022-12-15 NOTE — ED Provider Notes (Signed)
[facility] EMERGENCY DEPT Provider Note   CSN: [id number] Arrival date & time: 12/14/22  1903     History  Chief Complaint  Patient presents with   Eye Drainage    Nicole Gonzalez is a 52 y.o. female.  The history is provided by the patient.  Patient presents bilateral eye swelling and drainage.  She reports she woke up this morning with symptoms of conjunctivitis in her left eye.  As the day went on she started having symptoms in her right eye.  She does not wear contact lenses.  No recent trauma.  She went to bed feeling well. She did an e-visit during the day (1245pm) and was placed on Polytrim After starting this medication, both of her eyes became edematous and red with increased drainage.  She does not recall having this medication previously. She is unsure if she has an allergy to this medication.     Home Medications Prior to Admission medications   Medication Sig Start Date End Date Taking? Authorizing Provider  BAYER CONTOUR NEXT TEST test strip Use as instructed 04/07/16   Panosh, Standley Brooking, MD  CONTOUR NEXT TEST test strip USE AS INSTRUCTED 08/28/20   Panosh, Standley Brooking, MD  Cyanocobalamin (VITAMIN B-12) 1000 MCG SUBL Place 1 tablet (1,000 mcg total) under the tongue daily. 03/10/22   Panosh, Standley Brooking, MD  fexofenadine (ALLEGRA) 180 MG tablet Take 180 mg by mouth daily as needed (allergies.). In the morning     [provider]  glucose blood (BAYER CONTOUR TEST) test strip Next strips Test 1-2 times daily Dx E11.9 04/17/19   Panosh, Standley Brooking, MD  ibuprofen (ADVIL,MOTRIN) 200 MG tablet Take 600 mg by mouth every 8 (eight) hours as needed (pain).    [provider]  levocetirizine (XYZAL) 5 MG tablet Take 5 mg by mouth every evening.  08/14/14   [provider]  lisinopril (ZESTRIL) 10 MG tablet TAKE 1 TABLET(10 MG) BY MOUTH DAILY FOR HIGH BLOOD PRESSURE 08/30/22   Panosh, Standley Brooking, MD  LORazepam (ATIVAN) 1 MG tablet Take 1 tablet (1 mg total) by  mouth as directed. 1 hour prior to your MRI appointment. DO NOT drive while taking this medication 06/15/22   Esterwood, Amy S, PA-C  metFORMIN (GLUCOPHAGE-XR) 500 MG 24 hr tablet Take 4 tablets daily 07/21/22   Panosh, Standley Brooking, MD  Microlet Lancets MISC USE TO CHECK BLOOD SUGAR ONE TO TWO TIMES DAILY 08/28/20   Panosh, Standley Brooking, MD  montelukast (SINGULAIR) 10 MG tablet Take 10 mg by mouth every evening.  01/26/13   Tiajuana Amass, MD  Multiple Vitamin (MULTIVITAMIN WITH MINERALS) TABS tablet Take 1 tablet by mouth daily.    [provider]  omeprazole (PRILOSEC) 20 MG capsule Take 20 mg by mouth every evening.     [provider]  QNASL 80 MCG/ACT AERS Place 2 sprays into the nose every evening.  03/15/13   Tiajuana Amass, MD  simvastatin (ZOCOR) 40 MG tablet TAKE 1 TABLET(40 MG) BY MOUTH DAILY AT 6 PM 08/30/22   Panosh, Standley Brooking, MD  tirzepatide [facility]) 5 MG/0.5ML Pen Inject 5 mg into the skin once a week. 11/25/22   Panosh, Standley Brooking, MD  trimethoprim-polymyxin b (POLYTRIM) ophthalmic solution Place 1 drop into the left eye every 6 (six) hours. 12/14/22   [name] A, FNP      Allergies    Contrast media [iodinated contrast media], Adhesive [tape], Other, and Stadol [butorphanol]  Review of Systems   Review of Systems  Constitutional:  Negative for chills and fever.    Physical Exam Updated Vital Signs BP (!) 146/86 (BP Location: Right Arm)   Pulse 93   Temp 97.8 F (36.6 C) (Temporal)   Resp 18   Ht 1.613 m (5' 3.5")   Wt 90.3 kg   SpO2 98%   BMI 34.71 kg/m  Physical Exam CONSTITUTIONAL: Well developed/well nourished HEAD: Normocephalic/atraumatic EYES: EOMI/PERRL Bilateral conjunctival erythema.  Bilateral chemosis is noted. No proptosis. Clear discharge noted bilaterally. Visual acuity 20/20 bilaterally NEURO: Pt is awake/alert/appropriate, moves all extremitiesx4.  No facial droop.   SKIN: warm, color normal PSYCH: no abnormalities of mood noted, alert and  oriented to situation    ED Results / Procedures / Treatments   Labs (all labs ordered are listed, but only abnormal results are displayed) Labs Reviewed - No data to display  EKG None  Radiology No results found.  Procedures Procedures    Medications Ordered in ED Medications - No data to display  ED Course/ Medical Decision Making/ A&P Clinical Course as of 12/15/22 0023  Thu Dec 15, 2022  0022 I suspect patient is an allergic type reaction to the topical medications.  She is in no distress.  Visual acuity is preserved.  No recent fevers or chills.  She will stop the Polytrim.  Will start Benadryl as well as topical meds for allergic reaction.  She will be referred to ophthalmology if no improvement in 24 hours.  We discussed return precautions [DW]    Clinical Course User Index [DW] Ripley Fraise, MD                           Medical Decision Making          Final Clinical Impression(s) / ED Diagnoses Final diagnoses:  None    Rx / DC Orders ED Discharge Orders     None         Ripley Fraise, MD 12/15/22 [phone]

## 2022-12-19 ENCOUNTER — Telehealth: Payer: BC Managed Care – PPO | Admitting: Physician Assistant

## 2022-12-19 DIAGNOSIS — B9689 Other specified bacterial agents as the cause of diseases classified elsewhere: Secondary | ICD-10-CM

## 2022-12-19 DIAGNOSIS — J208 Acute bronchitis due to other specified organisms: Secondary | ICD-10-CM

## 2022-12-19 MED ORDER — BENZONATATE 100 MG PO CAPS: 100.0000 mg | ORAL_CAPSULE | Freq: Three times a day (TID) | ORAL | 0 refills | Status: DC | PRN

## 2022-12-19 MED ORDER — AZITHROMYCIN 250 MG PO TABS: ORAL_TABLET | ORAL | 0 refills | Status: AC

## 2022-12-19 NOTE — Progress Notes (Signed)
We are sorry that you are not feeling well.  Here is how we plan to help!  Based on your presentation I believe you most likely have A cough due to bacteria.  When patients have a fever and a productive cough with a change in color or increased sputum production, we are concerned about bacterial bronchitis.  If left untreated it can progress to pneumonia.  If your symptoms do not improve with your treatment plan it is important that you contact your provider.   I have prescribed Azithromyin 250 mg: two tablets now and then one tablet daily for 4 additonal days    In addition you may use A non-prescription cough medication called Mucinex DM: take 2 tablets every 12 hours. and A prescription cough medication called Tessalon Perles 100mg. You may take 1-2 capsules every 8 hours as needed for your cough.   From your responses in the eVisit questionnaire you describe inflammation in the upper respiratory tract which is causing a significant cough.  This is commonly called Bronchitis and has four common causes:   Allergies Viral Infections Acid Reflux Bacterial Infection Allergies, viruses and acid reflux are treated by controlling symptoms or eliminating the cause. An example might be a cough caused by taking certain blood pressure medications. You stop the cough by changing the medication. Another example might be a cough caused by acid reflux. Controlling the reflux helps control the cough.  USE OF BRONCHODILATOR ("RESCUE") INHALERS: There is a risk from using your bronchodilator too frequently.  The risk is that over-reliance on a medication which only relaxes the muscles surrounding the breathing tubes can reduce the effectiveness of medications prescribed to reduce swelling and congestion of the tubes themselves.  Although you feel brief relief from the bronchodilator inhaler, your asthma may actually be worsening with the tubes becoming more swollen and filled with mucus.  This can delay other  crucial treatments, such as oral steroid medications. If you need to use a bronchodilator inhaler daily, several times per day, you should discuss this with your provider.  There are probably better treatments that could be used to keep your asthma under control.     HOME CARE Only take medications as instructed by your medical team. Complete the entire course of an antibiotic. Drink plenty of fluids and get plenty of rest. Avoid close contacts especially the very young and the elderly Cover your mouth if you cough or cough into your sleeve. Always remember to wash your hands A steam or ultrasonic humidifier can help congestion.   GET HELP RIGHT AWAY IF: You develop worsening fever. You become short of breath You cough up blood. Your symptoms persist after you have completed your treatment plan MAKE SURE YOU  Understand these instructions. Will watch your condition. Will get help right away if you are not doing well or get worse.    Thank you for choosing an e-visit.  Your e-visit answers were reviewed by a board certified advanced clinical practitioner to complete your personal care plan. Depending upon the condition, your plan could have included both over the counter or prescription medications.  Please review your pharmacy choice. Make sure the pharmacy is open so you can pick up prescription now. If there is a problem, you may contact your provider through MyChart messaging and have the prescription routed to another pharmacy.  Your safety is important to us. If you have drug allergies check your prescription carefully.   For the next 24 hours you can use   MyChart to ask questions about today's visit, request a non-urgent call back, or ask for a work or school excuse. You will get an email in the next two days asking about your experience. I hope that your e-visit has been valuable and will speed your recovery.  I have spent 5 minutes in review of e-visit questionnaire, review and  updating patient chart, medical decision making and response to patient.   Jorge Amparo M Ashur Glatfelter, PA-C  

## 2023-01-07 ENCOUNTER — Other Ambulatory Visit: Payer: Self-pay | Admitting: Internal Medicine

## 2023-01-07 DIAGNOSIS — E1165 Type 2 diabetes mellitus with hyperglycemia: Secondary | ICD-10-CM

## 2023-01-09 ENCOUNTER — Ambulatory Visit
Admission: RE | Admit: 2023-01-09 | Discharge: 2023-01-09 | Disposition: A | Discharge status: Home / Self Care | Payer: BC Managed Care – PPO | Source: Ambulatory Visit | Attending: Internal Medicine | Admitting: Internal Medicine

## 2023-01-09 DIAGNOSIS — Z1231 Encounter for screening mammogram for malignant neoplasm of breast: Secondary | ICD-10-CM

## 2023-01-25 ENCOUNTER — Other Ambulatory Visit: Payer: Self-pay | Admitting: Internal Medicine

## 2023-01-30 ENCOUNTER — Ambulatory Visit: Payer: BC Managed Care – PPO | Admitting: Internal Medicine

## 2023-01-30 NOTE — Progress Notes (Unsigned)
No chief complaint on file.   HPI: Nicole Gonzalez 52 y.o. come in for Chronic disease management  [facility] visit 11 23  began mounharo starter dose  Interim hx  conjunctivitis and bronchitis  ROS: See pertinent positives and negatives per HPI.  Past Medical History:  Diagnosis Date   Anemia     better after hysterectomy   Cancer ([place])    basil cell carcinoma/forehead   Diabetes mellitus without complication ([place])    DIABETES MELLITUS, GESTATIONAL, HX OF 06/02/2009   Qualifier: Diagnosis of  By: Regis Bill MD, Standley Brooking    Diverticulosis    Family history of breast cancer    Family history of genetic disease carrier    father has CDKN2A and ATM mutation   Family history of melanoma    Family history of pancreatic cancer    Fracture of lower leg     fall 2011   GERD (gastroesophageal reflux disease)    History of basal cell carcinoma excision    History of gestational diabetes    x2   HTN (hypertension)    Hyperlipidemia    [id number] trx    Increased frequency of headaches 03/15/2012   Internal hemorrhoids    Obesity    Seizures ([place])    as an infant d/t fever   Tubular adenoma of colon    UNSPECIFIED TACHYCARDIA 09/01/2010   Qualifier: Diagnosis of  By: Charlett Blake MD, Erline Levine      Family History  Problem Relation Age of Onset   Melanoma Father        x 3 times, first in 58's   Hypertension Father    Hyperlipidemia Father    Squamous cell carcinoma Father    Colon polyps Father    Heart disease Father    Pancreatic cancer Father 109       CDKN2A muation   Arthritis Brother         Psoriatic   Breast cancer Brother 70       ATM + mutation? or VUS   Diabetes Maternal Grandmother    Hyperlipidemia Maternal Grandmother    Heart disease Maternal Grandmother    Skin cancer Maternal Grandfather    Pancreatic cancer Paternal Grandmother 51       met liver   Heart disease Paternal Grandfather    Skin cancer Cousin    Breast cancer Other    Colon cancer Neg Hx    Stomach  cancer Neg Hx    Esophageal cancer Neg Hx     Social History   Socioeconomic History   Marital status: Married    Spouse name: Not on file   Number of children: 2   Years of education: Not on file   Highest education level: Not on file  Occupational History   Occupation: Barista: [employer]  Tobacco Use   Smoking status: Never   Smokeless tobacco: Never  Vaping Use   Vaping Use: Never used  Substance and Sexual Activity   Alcohol use: No   Drug use: No   Sexual activity: Not on file  Other Topics Concern   Not on file  Social History Narrative    Household of 4 -2 children   Son   to college     no pets     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    Nonsmoker married   Social Determinants  of Health   Financial Resource Strain: Not on file  Food Insecurity: Not on file  Transportation Needs: Not on file  Physical Activity: Not on file  Stress: Not on file  Social Connections: Not on file    Outpatient Medications Prior to Visit  Medication Sig Dispense Refill   BAYER CONTOUR NEXT TEST test strip Use as instructed 100 each 11   benzonatate (TESSALON) 100 MG capsule Take 1 capsule (100 mg total) by mouth 3 (three) times daily as needed. 30 capsule 0   CONTOUR NEXT TEST test strip USE AS INSTRUCTED 100 strip 3   Cyanocobalamin (VITAMIN B-12) 1000 MCG SUBL Place 1 tablet (1,000 mcg total) under the tongue daily. 90 tablet 3   fexofenadine (ALLEGRA) 180 MG tablet Take 180 mg by mouth daily as needed (allergies.). In the morning      glucose blood (BAYER CONTOUR TEST) test strip Next strips Test 1-2 times daily Dx E11.9 100 each 12   ibuprofen (ADVIL,MOTRIN) 200 MG tablet Take 600 mg by mouth every 8 (eight) hours as needed (pain).     levocetirizine (XYZAL) 5 MG tablet Take 5 mg by mouth every evening.      lisinopril (ZESTRIL) 10 MG tablet TAKE 1 TABLET(10 MG) BY MOUTH DAILY FOR HIGH BLOOD PRESSURE 90 tablet 1    LORazepam (ATIVAN) 1 MG tablet Take 1 tablet (1 mg total) by mouth as directed. 1 hour prior to your MRI appointment. DO NOT drive while taking this medication 1 tablet 0   metFORMIN (GLUCOPHAGE-XR) 500 MG 24 hr tablet TAKE 4 TABLETS BY MOUTH DAILY 360 tablet 0   Microlet Lancets MISC USE TO CHECK BLOOD SUGAR ONE TO TWO TIMES DAILY 100 each 3   montelukast (SINGULAIR) 10 MG tablet Take 10 mg by mouth every evening.      MOUNJARO 5 MG/0.5ML Pen ADMINISTER 5 MG UNDER THE SKIN 1 TIME A WEEK 2 mL 1   Multiple Vitamin (MULTIVITAMIN WITH MINERALS) TABS tablet Take 1 tablet by mouth daily.     Olopatadine HCl 0.2 % SOLN Apply 1 drop to eye daily. 2.5 mL 0   omeprazole (PRILOSEC) 20 MG capsule Take 20 mg by mouth every evening.      QNASL 80 MCG/ACT AERS Place 2 sprays into the nose every evening.      simvastatin (ZOCOR) 40 MG tablet TAKE 1 TABLET(40 MG) BY MOUTH DAILY AT 6 PM 90 tablet 1   trimethoprim-polymyxin b (POLYTRIM) ophthalmic solution Place 1 drop into the left eye every 6 (six) hours. 10 mL 0   No facility-administered medications prior to visit.     EXAM:  There were no vitals taken for this visit.  There is no height or weight on file to calculate BMI.  GENERAL: vitals reviewed and listed above, alert, oriented, appears well hydrated and in no acute distress HEENT: atraumatic, conjunctiva  clear, no obvious abnormalities on inspection of external nose and ears OP : no lesion edema or exudate  NECK: no obvious masses on inspection palpation  LUNGS: clear to auscultation bilaterally, no wheezes, rales or rhonchi, good air movement CV: HRRR, no clubbing cyanosis or  peripheral edema nl cap refill  MS: moves all extremities without noticeable focal  abnormality PSYCH: pleasant and cooperative, no obvious depression or anxiety Lab Results  Component Value Date   WBC 6.1 07/19/2022   HGB 13.9 07/19/2022   HCT 41.5 07/19/2022   PLT 239.0 07/19/2022   GLUCOSE 137 (H) 07/19/2022  CHOL 168 07/19/2022   TRIG 105.0 07/19/2022   HDL 49.30 07/19/2022   LDLDIRECT 172.4 10/02/2007   LDLCALC 97 07/19/2022   ALT 26 07/19/2022   ALT 26 07/19/2022   AST 18 07/19/2022   AST 18 07/19/2022   NA 138 07/19/2022   K 4.3 07/19/2022   CL 101 07/19/2022   CREATININE 0.57 07/19/2022   BUN 18 07/19/2022   CO2 26 07/19/2022   TSH 1.76 07/19/2022   HGBA1C 6.9 (A) 10/25/2022   MICROALBUR <0.7 07/19/2022   BP Readings from Last 3 Encounters:  12/14/22 (!) 146/86  10/25/22 116/78  07/19/22 118/82    ASSESSMENT AND PLAN:  Discussed the following assessment and plan:  Type 2 diabetes mellitus with hyperglycemia, without long-term current use of insulin (HCC)  Essential hypertension  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  There are no Patient Instructions on file for this visit.   Standley Brooking. Irvine Glorioso M.D.

## 2023-01-31 ENCOUNTER — Ambulatory Visit: Payer: BC Managed Care – PPO | Admitting: Internal Medicine

## 2023-01-31 ENCOUNTER — Encounter: Payer: Self-pay | Admitting: Internal Medicine

## 2023-01-31 ENCOUNTER — Other Ambulatory Visit: Payer: Self-pay | Admitting: Internal Medicine

## 2023-01-31 VITALS — BP 106/72 | HR 92 | Temp 98.5°F | Ht 63.5 in | Wt 194.0 lb

## 2023-01-31 DIAGNOSIS — I10 Essential (primary) hypertension: Secondary | ICD-10-CM | POA: Diagnosis not present

## 2023-01-31 DIAGNOSIS — Z1501 Genetic susceptibility to malignant neoplasm of breast: Secondary | ICD-10-CM

## 2023-01-31 DIAGNOSIS — Z79899 Other long term (current) drug therapy: Secondary | ICD-10-CM

## 2023-01-31 DIAGNOSIS — Z1509 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: Secondary | ICD-10-CM

## 2023-01-31 DIAGNOSIS — Z0000 Encounter for general adult medical examination without abnormal findings: Secondary | ICD-10-CM

## 2023-01-31 DIAGNOSIS — E1165 Type 2 diabetes mellitus with hyperglycemia: Secondary | ICD-10-CM

## 2023-01-31 DIAGNOSIS — E119 Type 2 diabetes mellitus without complications: Secondary | ICD-10-CM | POA: Diagnosis not present

## 2023-01-31 DIAGNOSIS — E785 Hyperlipidemia, unspecified: Secondary | ICD-10-CM

## 2023-01-31 LAB — POCT GLYCOSYLATED HEMOGLOBIN (HGB A1C): Hemoglobin A1C: 5.9 % — AB (ref 4.0–5.6)

## 2023-01-31 LAB — HM DIABETES EYE EXAM

## 2023-01-31 MED ORDER — TIRZEPATIDE 7.5 MG/0.5ML ~~LOC~~ SOAJ: 7.5000 mg | SUBCUTANEOUS | 5 refills | Status: DC

## 2023-01-31 NOTE — Patient Instructions (Signed)
Check bp if feels light headed  Make sure hydrated

## 2023-02-08 DIAGNOSIS — L821 Other seborrheic keratosis: Secondary | ICD-10-CM | POA: Diagnosis not present

## 2023-02-25 ENCOUNTER — Other Ambulatory Visit: Payer: Self-pay | Admitting: Internal Medicine

## 2023-03-01 ENCOUNTER — Other Ambulatory Visit: Payer: Self-pay | Admitting: Internal Medicine

## 2023-04-25 ENCOUNTER — Other Ambulatory Visit: Payer: Self-pay | Admitting: Internal Medicine

## 2023-04-25 DIAGNOSIS — J3089 Other allergic rhinitis: Secondary | ICD-10-CM | POA: Diagnosis not present

## 2023-04-25 DIAGNOSIS — H8113 Benign paroxysmal vertigo, bilateral: Secondary | ICD-10-CM | POA: Diagnosis not present

## 2023-05-11 DIAGNOSIS — Z01419 Encounter for gynecological examination (general) (routine) without abnormal findings: Secondary | ICD-10-CM | POA: Diagnosis not present

## 2023-05-16 ENCOUNTER — Telehealth: Payer: Self-pay | Admitting: Physician Assistant

## 2023-05-16 NOTE — Telephone Encounter (Signed)
Spoke with the patient. Advised I will look into getting the order for the MRI or scheduling her an appointment depending on what the provider wants. She states understanding and will wait for the decision.

## 2023-05-16 NOTE — Telephone Encounter (Signed)
Inbound call from patient stating she needs a MRI yearly and she haven't hear anything for this year yet .Please advise

## 2023-05-17 ENCOUNTER — Other Ambulatory Visit: Payer: Self-pay

## 2023-05-17 DIAGNOSIS — Z800 Family history of malignant neoplasm of digestive organs: Secondary | ICD-10-CM

## 2023-05-17 NOTE — Telephone Encounter (Signed)
Called the patient. No answer. Left a voicemail. MRI/mrcp w/wo of abd pelvis to be scheduled for 07/02/23 or later. Will receive a call from scheduling dept. Appointment with Dr Rhea Belton in October. Call with any questions.

## 2023-05-26 ENCOUNTER — Other Ambulatory Visit: Payer: Self-pay | Admitting: Family

## 2023-06-02 ENCOUNTER — Encounter: Payer: Self-pay | Admitting: Internal Medicine

## 2023-06-02 MED ORDER — SIMVASTATIN 40 MG PO TABS: ORAL_TABLET | ORAL | 1 refills | Status: DC

## 2023-06-02 MED ORDER — LISINOPRIL 10 MG PO TABS: ORAL_TABLET | ORAL | 3 refills | Status: DC

## 2023-06-22 ENCOUNTER — Other Ambulatory Visit: Payer: Self-pay | Admitting: Physician Assistant

## 2023-06-22 NOTE — Telephone Encounter (Signed)
Nicole Gonzalez,  This patient is scheduled for yearly MRI/MCRP on 07-05-23. It looks like she typically uses Ativan prior to imaging.    Can you please send this in?  Thank you.

## 2023-06-29 ENCOUNTER — Other Ambulatory Visit: Payer: Self-pay

## 2023-06-30 ENCOUNTER — Other Ambulatory Visit: Payer: Self-pay | Admitting: *Deleted

## 2023-06-30 ENCOUNTER — Other Ambulatory Visit: Payer: Self-pay | Admitting: Physician Assistant

## 2023-06-30 MED ORDER — LORAZEPAM 1 MG PO TABS: 1.0000 mg | ORAL_TABLET | ORAL | 0 refills | Status: DC

## 2023-07-04 DIAGNOSIS — R92333 Mammographic heterogeneous density, bilateral breasts: Secondary | ICD-10-CM | POA: Diagnosis not present

## 2023-07-04 DIAGNOSIS — Z1501 Genetic susceptibility to malignant neoplasm of breast: Secondary | ICD-10-CM | POA: Diagnosis not present

## 2023-07-04 DIAGNOSIS — Z85828 Personal history of other malignant neoplasm of skin: Secondary | ICD-10-CM | POA: Diagnosis not present

## 2023-07-04 DIAGNOSIS — Z803 Family history of malignant neoplasm of breast: Secondary | ICD-10-CM | POA: Diagnosis not present

## 2023-07-04 DIAGNOSIS — Z1239 Encounter for other screening for malignant neoplasm of breast: Secondary | ICD-10-CM | POA: Diagnosis not present

## 2023-07-04 DIAGNOSIS — Z90711 Acquired absence of uterus with remaining cervical stump: Secondary | ICD-10-CM | POA: Diagnosis not present

## 2023-07-05 ENCOUNTER — Other Ambulatory Visit: Payer: Self-pay | Admitting: Physician Assistant

## 2023-07-05 ENCOUNTER — Ambulatory Visit (HOSPITAL_COMMUNITY)
Admission: RE | Admit: 2023-07-05 | Discharge: 2023-07-05 | Disposition: A | Discharge status: Home / Self Care | Payer: BC Managed Care – PPO | Source: Ambulatory Visit | Attending: Physician Assistant | Admitting: Physician Assistant

## 2023-07-05 ENCOUNTER — Telehealth: Payer: Self-pay | Admitting: *Deleted

## 2023-07-05 DIAGNOSIS — Z800 Family history of malignant neoplasm of digestive organs: Secondary | ICD-10-CM | POA: Diagnosis not present

## 2023-07-05 DIAGNOSIS — K760 Fatty (change of) liver, not elsewhere classified: Secondary | ICD-10-CM | POA: Insufficient documentation

## 2023-07-05 DIAGNOSIS — Z1289 Encounter for screening for malignant neoplasm of other sites: Secondary | ICD-10-CM | POA: Diagnosis not present

## 2023-07-05 DIAGNOSIS — R160 Hepatomegaly, not elsewhere classified: Secondary | ICD-10-CM | POA: Diagnosis not present

## 2023-07-05 MED ORDER — GADOBUTROL 1 MMOL/ML IV SOLN
9.0000 mL | Freq: Once | INTRAVENOUS | Status: AC | PRN
  Administered 2023-07-05: 9 mL via INTRAVENOUS

## 2023-07-05 MED ORDER — LORAZEPAM 1 MG PO TABS: 1.0000 mg | ORAL_TABLET | ORAL | 0 refills | Status: DC

## 2023-07-05 NOTE — Telephone Encounter (Signed)
Nicole Esterwood, PA-C has asked that patient be sent an additional 1 tablet Ativan to pharmacy. Patient took previous rx for scheduled MRCP today but was unable to have the test due to Breast MRI yesterday (24 hour time lapse must occur for additional MRI testing). Therefore, patient was rescheduled.

## 2023-07-12 ENCOUNTER — Encounter (INDEPENDENT_AMBULATORY_CARE_PROVIDER_SITE_OTHER): Payer: Self-pay

## 2023-07-25 NOTE — Progress Notes (Unsigned)
No chief complaint on file.   HPI: Patient  Nicole Gonzalez  52 y.o. comes in today for Preventive Health Care visit   Health Maintenance  Topic Date Due   HIV Screening  Never done   Hepatitis C Screening  Never done   PAP SMEAR-Modifier  03/26/2016   COVID-19 Vaccine (3 - Moderna risk series) 04/14/2020   FOOT EXAM  08/19/2021   Diabetic kidney evaluation - eGFR measurement  07/20/2023   Diabetic kidney evaluation - Urine ACR  07/20/2023   INFLUENZA VACCINE  07/13/2023   HEMOGLOBIN A1C  08/01/2023   Colonoscopy  01/18/2024   OPHTHALMOLOGY EXAM  02/01/2024   MAMMOGRAM  01/09/2025   DTaP/Tdap/Td (3 - Td or Tdap) 07/18/2029   Zoster Vaccines- Shingrix  Completed   HPV VACCINES  Aged Out   Health Maintenance Review LIFESTYLE:  Exercise:   Tobacco/ETS: Alcohol:  Sugar beverages: Sleep: Drug use: no HH of  Work:    ROS:  GEN/ HEENT: No fever, significant weight changes sweats headaches vision problems hearing changes, CV/ PULM; No chest pain shortness of breath cough, syncope,edema  change in exercise tolerance. GI /GU: No adominal pain, vomiting, change in bowel habits. No blood in the stool. No significant GU symptoms. SKIN/HEME: ,no acute skin rashes suspicious lesions or bleeding. No lymphadenopathy, nodules, masses.  NEURO/ PSYCH:  No neurologic signs such as weakness numbness. No depression anxiety. IMM/ Allergy: No unusual infections.  Allergy .   REST of 12 system review negative except as per HPI   Past Medical History:  Diagnosis Date   Anemia     better after hysterectomy   Cancer (HCC)    basil cell carcinoma/forehead   Diabetes mellitus without complication (HCC)    DIABETES MELLITUS, GESTATIONAL, HX OF 06/02/2009   Qualifier: Diagnosis of  By: Fabian Sharp MD, Neta Mends    Diverticulosis    Family history of breast cancer    Family history of genetic disease carrier    father has CDKN2A and ATM mutation   Family history of melanoma    Family history of  pancreatic cancer    Fracture of lower leg     fall 2011   GERD (gastroesophageal reflux disease)    History of basal cell carcinoma excision    History of gestational diabetes    x2   HTN (hypertension)    Hyperlipidemia    [id number] 043 trx    Increased frequency of headaches 03/15/2012   Internal hemorrhoids    Obesity    Seizures (HCC)    as an infant d/t fever   Tubular adenoma of colon    UNSPECIFIED TACHYCARDIA 09/01/2010   Qualifier: Diagnosis of  By: Abner Greenspan MD, Misty Stanley      Past Surgical History:  Procedure Laterality Date   ABDOMINAL HYSTERECTOMY  06/29/2010    question had abnormal cells on Path, fibroids   BASAL CELL CARCINOMA EXCISION     BREAST BIOPSY Right 2009   COLONOSCOPY WITH PROPOFOL N/A 01/17/2019   Procedure: COLONOSCOPY WITH PROPOFOL;  Surgeon: Rachael Fee, MD;  Location: [facility];  Service: Endoscopy;  Laterality: N/A;   ESOPHAGOGASTRODUODENOSCOPY (EGD) WITH PROPOFOL N/A 01/17/2019   Procedure: ESOPHAGOGASTRODUODENOSCOPY (EGD) WITH PROPOFOL;  Surgeon: Rachael Fee, MD;  Location: [facility];  Service: Endoscopy;  Laterality: N/A;   EUS N/A 01/17/2019   Procedure: UPPER ENDOSCOPIC ULTRASOUND (EUS) RADIAL;  Surgeon: Rachael Fee, MD;  Location: [facility];  Service: Endoscopy;  Laterality: N/A;  POLYPECTOMY  01/17/2019   Procedure: POLYPECTOMY;  Surgeon: Rachael Fee, MD;  Location: [facility];  Service: Endoscopy;;   TONSILLECTOMY      Family History  Problem Relation Age of Onset   Melanoma Father        x 3 times, first in 77's   Hypertension Father    Hyperlipidemia Father    Squamous cell carcinoma Father    Colon polyps Father    Heart disease Father    Pancreatic cancer Father 41       CDKN2A muation   Arthritis Brother         Psoriatic   Breast cancer Brother 13       ATM + mutation? or VUS   Diabetes Maternal Grandmother    Hyperlipidemia Maternal Grandmother    Heart disease Maternal Grandmother    Skin cancer  Maternal Grandfather    Pancreatic cancer Paternal Grandmother 72       met liver   Heart disease Paternal Grandfather    Skin cancer Cousin    Breast cancer Other    Colon cancer Neg Hx    Stomach cancer Neg Hx    Esophageal cancer Neg Hx     Social History   Socioeconomic History   Marital status: Married    Spouse name: Not on file   Number of children: 2   Years of education: Not on file   Highest education level: Not on file  Occupational History   Occupation: Government social research officer: [employer]  Tobacco Use   Smoking status: Never   Smokeless tobacco: Never  Vaping Use   Vaping status: Never Used  Substance and Sexual Activity   Alcohol use: No   Drug use: No   Sexual activity: Not on file  Other Topics Concern   Not on file  Social History Narrative    Household of 4 -2 children   Son   to college     no pets     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    Nonsmoker married   Social Determinants of Corporate investment banker Strain: Not on file  Food Insecurity: Not on file  Transportation Needs: Not on file  Physical Activity: Not on file  Stress: Not on file  Social Connections: Unknown (05/16/2023)   Received from [facility]   Social Network    Social Network: Not on file    Outpatient Medications Prior to Visit  Medication Sig Dispense Refill   BAYER CONTOUR NEXT TEST test strip Use as instructed 100 each 11   benzonatate (TESSALON) 100 MG capsule Take 1 capsule (100 mg total) by mouth 3 (three) times daily as needed. 30 capsule 0   CONTOUR NEXT TEST test strip USE AS INSTRUCTED 100 strip 3   Cyanocobalamin (VITAMIN B-12) 1000 MCG SUBL Place 1 tablet (1,000 mcg total) under the tongue daily. 90 tablet 3   fexofenadine (ALLEGRA) 180 MG tablet Take 180 mg by mouth daily as needed (allergies.). In the morning      glucose blood (BAYER CONTOUR TEST) test strip Next strips Test 1-2 times daily Dx  E11.9 100 each 12   ibuprofen (ADVIL,MOTRIN) 200 MG tablet Take 600 mg by mouth every 8 (eight) hours as needed (pain).     levocetirizine (XYZAL) 5 MG tablet Take 5 mg by mouth every evening.  lisinopril (ZESTRIL) 10 MG tablet TAKE 1 TABLET(10 MG) BY MOUTH DAILY FOR HIGH BLOOD PRESSURE 30 tablet 3   LORazepam (ATIVAN) 1 MG tablet Take 1 tablet (1 mg total) by mouth as directed. 1 hour prior to your MRI appointment. DO NOT drive while taking this medication 1 tablet 0   metFORMIN (GLUCOPHAGE-XR) 500 MG 24 hr tablet TAKE 4 TABLETS BY MOUTH DAILY 360 tablet 0   Microlet Lancets MISC USE TO CHECK BLOOD SUGAR ONE TO TWO TIMES DAILY 100 each 3   montelukast (SINGULAIR) 10 MG tablet Take 10 mg by mouth every evening.      Multiple Vitamin (MULTIVITAMIN WITH MINERALS) TABS tablet Take 1 tablet by mouth daily.     Olopatadine HCl 0.2 % SOLN Apply 1 drop to eye daily. 2.5 mL 0   omeprazole (PRILOSEC) 20 MG capsule Take 20 mg by mouth every evening.      QNASL 80 MCG/ACT AERS Place 2 sprays into the nose every evening.      simvastatin (ZOCOR) 40 MG tablet TAKE 1 TABLET(40 MG) BY MOUTH DAILY AT 6 PM 90 tablet 1   tirzepatide (MOUNJARO) 7.5 MG/0.5ML Pen Inject 7.5 mg into the skin once a week. Dosage adjustment 6 mL 5   No facility-administered medications prior to visit.     EXAM:  There were no vitals taken for this visit.  There is no height or weight on file to calculate BMI. Wt Readings from Last 3 Encounters:  01/31/23 194 lb (88 kg)  12/14/22 199 lb 1.2 oz (90.3 kg)  10/25/22 199 lb (90.3 kg)    Physical Exam: Vital signs reviewed ZOX:WRUE is a well-developed well-nourished alert cooperative    who appearsr stated age in no acute distress.  HEENT: normocephalic atraumatic , Eyes: PERRL EOM's full, conjunctiva clear, Nares: paten,t no deformity discharge or tenderness., Ears: no deformity EAC's clear TMs with normal landmarks. Mouth: clear OP, no lesions, edema.  Moist mucous  membranes. Dentition in adequate repair. NECK: supple without masses, thyromegaly or bruits. CHEST/PULM:  Clear to auscultation and percussion breath sounds equal no wheeze , rales or rhonchi. No chest wall deformities or tenderness. Breast: normal by inspection . No dimpling, discharge, masses, tenderness or discharge . CV: PMI is nondisplaced, S1 S2 no gallops, murmurs, rubs. Peripheral pulses are full without delay.No JVD .  ABDOMEN: Bowel sounds normal nontender  No guard or rebound, no hepato splenomegal no CVA tenderness.  Extremtities:  No clubbing cyanosis or edema, no acute joint swelling or redness no focal atrophy NEURO:  Oriented x3, cranial nerves 3-12 appear to be intact, no obvious focal weakness,gait within normal limits no abnormal reflexes or asymmetrical SKIN: No acute rashes normal turgor, color, no bruising or petechiae. PSYCH: Oriented, good eye contact, no obvious depression anxiety, cognition and judgment appear normal. LN: no cervical axillary inguinal adenopathy  Lab Results  Component Value Date   WBC 6.1 07/19/2022   HGB 13.9 07/19/2022   HCT 41.5 07/19/2022   PLT 239.0 07/19/2022   GLUCOSE 137 (H) 07/19/2022   CHOL 168 07/19/2022   TRIG 105.0 07/19/2022   HDL 49.30 07/19/2022   LDLDIRECT 172.4 10/02/2007   LDLCALC 97 07/19/2022   ALT 26 07/19/2022   ALT 26 07/19/2022   AST 18 07/19/2022   AST 18 07/19/2022   NA 138 07/19/2022   K 4.3 07/19/2022   CL 101 07/19/2022   CREATININE 0.57 07/19/2022   BUN 18 07/19/2022   CO2 26 07/19/2022  TSH 1.76 07/19/2022   HGBA1C 5.9 (A) 01/31/2023   MICROALBUR <0.7 07/19/2022    BP Readings from Last 3 Encounters:  01/31/23 106/72  12/14/22 (!) 146/86  10/25/22 116/78    Lab plan reviewed with patient   ASSESSMENT AND PLAN:  Discussed the following assessment and plan:    ICD-10-CM   1. Visit for preventive health examination  Z00.00     2. Medication management  Z79.899     3. Hyperlipidemia,  unspecified hyperlipidemia type  E78.5     4. Type 2 diabetes mellitus with hyperglycemia, without long-term current use of insulin (HCC)  E11.65     5. Essential hypertension  I10      No follow-ups on file.  Patient Care Team: Sufyaan Palma, Neta Mends, MD as PCP - General Jones Broom, MD as Attending Physician (Obstetrics and Gynecology) Eileen Stanford, MD (Allergy) Haverstock, Elvin So, MD (Dermatology) Pyrtle, Carie Caddy, MD as Consulting Physician (Gastroenterology) There are no Patient Instructions on file for this visit.  Neta Mends. Palyn Scrima M.D.

## 2023-07-26 ENCOUNTER — Encounter: Payer: Self-pay | Admitting: Internal Medicine

## 2023-07-26 ENCOUNTER — Ambulatory Visit (INDEPENDENT_AMBULATORY_CARE_PROVIDER_SITE_OTHER): Payer: BC Managed Care – PPO | Admitting: Internal Medicine

## 2023-07-26 VITALS — BP 90/58 | HR 77 | Temp 97.5°F | Ht 64.0 in | Wt 195.8 lb

## 2023-07-26 DIAGNOSIS — Z79899 Other long term (current) drug therapy: Secondary | ICD-10-CM

## 2023-07-26 DIAGNOSIS — I10 Essential (primary) hypertension: Secondary | ICD-10-CM | POA: Diagnosis not present

## 2023-07-26 DIAGNOSIS — Z0000 Encounter for general adult medical examination without abnormal findings: Secondary | ICD-10-CM

## 2023-07-26 DIAGNOSIS — Z1501 Genetic susceptibility to malignant neoplasm of breast: Secondary | ICD-10-CM

## 2023-07-26 DIAGNOSIS — Z1509 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: Secondary | ICD-10-CM

## 2023-07-26 DIAGNOSIS — E1165 Type 2 diabetes mellitus with hyperglycemia: Secondary | ICD-10-CM | POA: Diagnosis not present

## 2023-07-26 DIAGNOSIS — E785 Hyperlipidemia, unspecified: Secondary | ICD-10-CM

## 2023-07-26 DIAGNOSIS — Z7985 Long-term (current) use of injectable non-insulin antidiabetic drugs: Secondary | ICD-10-CM

## 2023-07-26 LAB — TSH: TSH: 1.65 u[IU]/mL (ref 0.35–5.50)

## 2023-07-26 LAB — MICROALBUMIN / CREATININE URINE RATIO
Creatinine,U: 39.3 mg/dL
Microalb Creat Ratio: 1.8 mg/g (ref 0.0–30.0)
Microalb, Ur: <0.7 mg/dL (ref 0.0–1.9)

## 2023-07-26 LAB — BASIC METABOLIC PANEL
BUN: 15 mg/dL (ref 6–23)
CO2: 27 mEq/L (ref 19–32)
Calcium: 9.3 mg/dL (ref 8.4–10.5)
Chloride: 100 mEq/L (ref 96–112)
Creatinine, Ser: 0.59 mg/dL (ref 0.40–1.20)
GFR: 102.92 mL/min (ref >60.00)
Glucose, Bld: 83 mg/dL (ref 70–99)
Potassium: 4 mEq/L (ref 3.5–5.1)
Sodium: 136 mEq/L (ref 135–145)

## 2023-07-26 LAB — LIPID PANEL
Cholesterol: 150 mg/dL (ref 0–200)
HDL: 51.9 mg/dL (ref >39.00)
LDL Cholesterol: 83 mg/dL (ref 0–99)
NonHDL: 97.93
Total CHOL/HDL Ratio: 3
Triglycerides: 73 mg/dL (ref 0.0–149.0)
VLDL: 14.6 mg/dL (ref 0.0–40.0)

## 2023-07-26 LAB — CBC WITH DIFFERENTIAL/PLATELET
Basophils Absolute: 0 10*3/uL (ref 0.0–0.1)
Basophils Relative: 0.6 % (ref 0.0–3.0)
Eosinophils Absolute: 0.1 10*3/uL (ref 0.0–0.7)
Eosinophils Relative: 1.9 % (ref 0.0–5.0)
HCT: 41.5 % (ref 36.0–46.0)
Hemoglobin: 13.6 g/dL (ref 12.0–15.0)
Lymphocytes Relative: 26.8 % (ref 12.0–46.0)
Lymphs Abs: 2 10*3/uL (ref 0.7–4.0)
MCHC: 32.7 g/dL (ref 30.0–36.0)
MCV: 88.9 fl (ref 78.0–100.0)
Monocytes Absolute: 0.5 10*3/uL (ref 0.1–1.0)
Monocytes Relative: 6.2 % (ref 3.0–12.0)
Neutro Abs: 4.8 10*3/uL (ref 1.4–7.7)
Neutrophils Relative %: 64.5 % (ref 43.0–77.0)
Platelets: 252 10*3/uL (ref 150.0–400.0)
RBC: 4.67 Mil/uL (ref 3.87–5.11)
RDW: 13.1 % (ref 11.5–15.5)
WBC: 7.4 10*3/uL (ref 4.0–10.5)

## 2023-07-26 LAB — HEPATIC FUNCTION PANEL
ALT: 23 U/L (ref 0–35)
AST: 20 U/L (ref 0–37)
Albumin: 4.5 g/dL (ref 3.5–5.2)
Alkaline Phosphatase: 50 U/L (ref 39–117)
Bilirubin, Direct: 0.1 mg/dL (ref 0.0–0.3)
Total Bilirubin: 0.7 mg/dL (ref 0.2–1.2)
Total Protein: 7 g/dL (ref 6.0–8.3)

## 2023-07-26 LAB — HEMOGLOBIN A1C: Hgb A1c MFr Bld: 5.7 % (ref 4.6–6.5)

## 2023-07-26 MED ORDER — METFORMIN HCL ER 500 MG PO TB24: ORAL_TABLET | ORAL | 3 refills | Status: DC

## 2023-07-26 MED ORDER — MICROLET LANCETS MISC: 3 refills | Status: DC

## 2023-07-26 MED ORDER — TIRZEPATIDE 10 MG/0.5ML ~~LOC~~ SOAJ: 10.0000 mg | SUBCUTANEOUS | 2 refills | Status: DC

## 2023-07-26 MED ORDER — CONTOUR NEXT TEST VI STRP: 100.0000 | ORAL_STRIP | 3 refills | Status: DC

## 2023-07-26 NOTE — Patient Instructions (Signed)
Good to see you today . Check BP at home if consistently below  100 and or feel badly dizzy etc  we can decrease the lisinopril to 5 mg . Lab today  Sent in 10 mg of weekly mounjaro .  Let us know if getting significant side effects .  Plan fu depending or  4-6 months .

## 2023-07-27 NOTE — Progress Notes (Signed)
Results are all in normal range. And at goal  ldl 87 ok ( 70 better)   great work .   Continue  as planned

## 2023-08-16 ENCOUNTER — Encounter: Payer: Self-pay | Admitting: Internal Medicine

## 2023-08-16 DIAGNOSIS — M25561 Pain in right knee: Secondary | ICD-10-CM

## 2023-08-17 ENCOUNTER — Other Ambulatory Visit: Payer: Self-pay | Admitting: Oncology

## 2023-08-17 DIAGNOSIS — Z006 Encounter for examination for normal comparison and control in clinical research program: Secondary | ICD-10-CM

## 2023-08-17 NOTE — Telephone Encounter (Signed)
Let have you see sports medicine  team Please do referral to [facility]

## 2023-08-28 NOTE — Progress Notes (Unsigned)
    Aleen Sells D.[facility] [address] [place] [address] Phone: [phone]   Assessment and Plan:     There are no diagnoses linked to this encounter.  ***   Pertinent previous records reviewed include ***   Follow Up: ***     Subjective:   I, Claris Guymon, am serving as a Neurosurgeon for Doctor Richardean Sale  Chief Complaint: right leg pain   HPI:   08/29/2023 Patient is a 52 year old female complaining of right leg pain. Patient states  Relevant Historical Information: ***  Additional pertinent review of systems negative.   Current Outpatient Medications:    benzonatate (TESSALON) 100 MG capsule, Take 1 capsule (100 mg total) by mouth 3 (three) times daily as needed. (Patient not taking: Reported on 07/26/2023), Disp: 30 capsule, Rfl: 0   Cyanocobalamin (VITAMIN B-12) 1000 MCG SUBL, Place 1 tablet (1,000 mcg total) under the tongue daily., Disp: 90 tablet, Rfl: 3   fexofenadine (ALLEGRA) 180 MG tablet, Take 180 mg by mouth daily as needed (allergies.). In the morning , Disp: , Rfl:    glucose blood (CONTOUR NEXT TEST) test strip, 100 each by Other route See admin instructions. Use as instructed, Disp: 100 strip, Rfl: 3   ibuprofen (ADVIL,MOTRIN) 200 MG tablet, Take 600 mg by mouth every 8 (eight) hours as needed (pain)., Disp: , Rfl:    levocetirizine (XYZAL) 5 MG tablet, Take 5 mg by mouth every evening. , Disp: , Rfl:    lisinopril (ZESTRIL) 10 MG tablet, TAKE 1 TABLET(10 MG) BY MOUTH DAILY FOR HIGH BLOOD PRESSURE, Disp: 30 tablet, Rfl: 3   LORazepam (ATIVAN) 1 MG tablet, Take 1 tablet (1 mg total) by mouth as directed. 1 hour prior to your MRI appointment. DO NOT drive while taking this medication (Patient not taking: Reported on 07/26/2023), Disp: 1 tablet, Rfl: 0   metFORMIN (GLUCOPHAGE-XR) 500 MG 24 hr tablet, TAKE 4 TABLETS BY MOUTH DAILY, Disp: 360 tablet, Rfl: 3   Microlet Lancets MISC, USE TO CHECK BLOOD SUGAR ONE TO  TWO TIMES DAILY, Disp: 100 each, Rfl: 3   montelukast (SINGULAIR) 10 MG tablet, Take 10 mg by mouth every evening. , Disp: , Rfl:    Multiple Vitamin (MULTIVITAMIN WITH MINERALS) TABS tablet, Take 1 tablet by mouth daily., Disp: , Rfl:    omeprazole (PRILOSEC) 20 MG capsule, Take 20 mg by mouth every evening. , Disp: , Rfl:    QNASL 80 MCG/ACT AERS, Place 2 sprays into the nose every evening. , Disp: , Rfl:    simvastatin (ZOCOR) 40 MG tablet, TAKE 1 TABLET(40 MG) BY MOUTH DAILY AT 6 PM, Disp: 90 tablet, Rfl: 1   tirzepatide (MOUNJARO) 10 MG/0.5ML Pen, Inject 10 mg into the skin once a week. Dosage change, Disp: 6 mL, Rfl: 2   Objective:     There were no vitals filed for this visit.    There is no height or weight on file to calculate BMI.    Physical Exam:    ***   Electronically signed by:  Aleen Sells D.[facility] 7:14 AM 08/28/23

## 2023-08-29 ENCOUNTER — Ambulatory Visit: Payer: BC Managed Care – PPO | Admitting: Sports Medicine

## 2023-08-29 VITALS — HR 87 | Ht 64.0 in | Wt 195.0 lb

## 2023-08-29 DIAGNOSIS — M25572 Pain in left ankle and joints of left foot: Secondary | ICD-10-CM

## 2023-08-29 DIAGNOSIS — M25571 Pain in right ankle and joints of right foot: Secondary | ICD-10-CM

## 2023-08-29 DIAGNOSIS — M79604 Pain in right leg: Secondary | ICD-10-CM | POA: Diagnosis not present

## 2023-08-29 DIAGNOSIS — G8929 Other chronic pain: Secondary | ICD-10-CM

## 2023-08-29 MED ORDER — MELOXICAM 15 MG PO TABS: 15.0000 mg | ORAL_TABLET | Freq: Every day | ORAL | 0 refills | Status: DC

## 2023-08-29 NOTE — Patient Instructions (Signed)
-   Start meloxicam 15 mg daily x2 weeks.  If still having pain after 2 weeks, complete 3rd-week of meloxicam. May use remaining meloxicam as needed once daily for pain control.  Do not to use additional NSAIDs while taking meloxicam.  May use Tylenol [phone] mg 2 to 3 times a day for breakthrough pain. Calf HEP  Recommend stationary bike, elliptical, and water aerobics If walking for physical would recommend cushioned shoes with gelled inserts  3 week follow up

## 2023-09-21 NOTE — Progress Notes (Signed)
Nicole Gonzalez D.[facility] [address] [place] [address] Phone: [phone]   Assessment and Plan:     1. Right leg pain 2. Chronic pain of both ankles  -Chronic with exacerbation, subsequent visit -Overall improvement in right anterior shin pain and bilateral ankle discomfort with patient completing 2 weeks of meloxicam, HEP, avoiding exercising on concrete, and getting new tennis shoes - Patient is still experiencing pain primarily in right anterior shin that may be related to early stages of stress reaction versus medial tibial stress syndrome that have not fully resolved - Discontinue meloxicam - Use Tylenol for day-to-day pain relief - Continue to use new footwear with exercising and can use [employer] or other similar store for assistance in buying a cushioned insert - Recommend physical activity that avoids foot strike such as water aerobics, stationary bike, elliptical - X-ray obtained in clinic.  My interpretation: No acute fracture or dislocation - Continue HEP.  Offered physical therapy at today's visit which was declined, though patient could call if she wants [place] to put in referral in the future  Pertinent previous records reviewed include none   Follow Up: 6 weeks for reevaluation.  If no improvement or worsening of symptoms, could discuss advanced imaging versus physical therapy versus prednisone course   Subjective:   I, Nicole Gonzalez, am serving as a Neurosurgeon for Doctor Richardean Sale   Chief Complaint: right leg pain    HPI:    08/29/2023 Patient is a 52 year old female complaining of right leg pain. Patient states that she right knee to ankle pain . No swelling no bruisng . Advil doesn't help. Isnt able to sleep through the night. Long distance driving is painful and when she walks on pavement. She was told that her hip are not aligned. Intermittent pain for a few months. Pain has been increasing over the last week .  Does endorse bilateral foot pain on the top of the foot   09/22/2023 Patient states that she has had small improvement. Meloxicam caused joint pain. She still feels as though something is not right . She is TTP above the ankle    Relevant Historical Information: Hypertension, DM type II  Additional pertinent review of systems negative.   Current Outpatient Medications:    benzonatate (TESSALON) 100 MG capsule, Take 1 capsule (100 mg total) by mouth 3 (three) times daily as needed., Disp: 30 capsule, Rfl: 0   Cyanocobalamin (VITAMIN B-12) 1000 MCG SUBL, Place 1 tablet (1,000 mcg total) under the tongue daily., Disp: 90 tablet, Rfl: 3   fexofenadine (ALLEGRA) 180 MG tablet, Take 180 mg by mouth daily as needed (allergies.). In the morning , Disp: , Rfl:    glucose blood (CONTOUR NEXT TEST) test strip, 100 each by Other route See admin instructions. Use as instructed, Disp: 100 strip, Rfl: 3   ibuprofen (ADVIL,MOTRIN) 200 MG tablet, Take 600 mg by mouth every 8 (eight) hours as needed (pain)., Disp: , Rfl:    levocetirizine (XYZAL) 5 MG tablet, Take 5 mg by mouth every evening. , Disp: , Rfl:    lisinopril (ZESTRIL) 10 MG tablet, TAKE 1 TABLET(10 MG) BY MOUTH DAILY FOR HIGH BLOOD PRESSURE, Disp: 30 tablet, Rfl: 3   LORazepam (ATIVAN) 1 MG tablet, Take 1 tablet (1 mg total) by mouth as directed. 1 hour prior to your MRI appointment. DO NOT drive while taking this medication, Disp: 1 tablet, Rfl: 0   meloxicam (MOBIC) 15 MG  tablet, Take 1 tablet (15 mg total) by mouth daily., Disp: 30 tablet, Rfl: 0   metFORMIN (GLUCOPHAGE-XR) 500 MG 24 hr tablet, TAKE 4 TABLETS BY MOUTH DAILY, Disp: 360 tablet, Rfl: 3   Microlet Lancets MISC, USE TO CHECK BLOOD SUGAR ONE TO TWO TIMES DAILY, Disp: 100 each, Rfl: 3   montelukast (SINGULAIR) 10 MG tablet, Take 10 mg by mouth every evening. , Disp: , Rfl:    Multiple Vitamin (MULTIVITAMIN WITH MINERALS) TABS tablet, Take 1 tablet by mouth daily., Disp: , Rfl:     omeprazole (PRILOSEC) 20 MG capsule, Take 20 mg by mouth every evening. , Disp: , Rfl:    QNASL 80 MCG/ACT AERS, Place 2 sprays into the nose every evening. , Disp: , Rfl:    simvastatin (ZOCOR) 40 MG tablet, TAKE 1 TABLET(40 MG) BY MOUTH DAILY AT 6 PM, Disp: 90 tablet, Rfl: 1   tirzepatide (MOUNJARO) 10 MG/0.5ML Pen, Inject 10 mg into the skin once a week. Dosage change, Disp: 6 mL, Rfl: 2   Objective:     Vitals:   09/22/23 0916  Pulse: 83  SpO2: 99%  Weight: 195 lb (88.5 kg)  Height: 5\' 4"  (1.626 m)      Body mass index is 33.47 kg/m.    Physical Exam:    General:  awake, alert oriented, no acute distress nontoxic Skin: no suspicious lesions or rashes Neuro:sensation intact and strength 5/5 with no deficits, no atrophy, normal muscle tone Psych: No signs of anxiety, depression or other mood disorder   Right leg/ankle/knee: No swelling No deformity Neg fluid wave, joint milking Knee ROM Flex 110, Ext 0 TTP middle third of medial tibia NTTP over the quad tendon, medial fem condyle, lat fem condyle, patella, plica, patella tendon, tibial tuberostiy, fibular head, posterior fossa, pes anserine bursa, gerdy's tubercle, medial jt line, lateral jt line, medial and lateral malleoli Neg anterior and posterior drawer Neg lachman Neg sag sign Negative varus stress Negative valgus stress Negative McMurray   Gait normal     Electronically signed by:  Nicole Gonzalez D[facility] 9:37 AM 09/22/23

## 2023-09-22 ENCOUNTER — Ambulatory Visit: Payer: BC Managed Care – PPO | Admitting: Sports Medicine

## 2023-09-22 ENCOUNTER — Ambulatory Visit: Payer: BC Managed Care – PPO

## 2023-09-22 VITALS — HR 83 | Ht 64.0 in | Wt 195.0 lb

## 2023-09-22 DIAGNOSIS — M79604 Pain in right leg: Secondary | ICD-10-CM

## 2023-09-22 DIAGNOSIS — G8929 Other chronic pain: Secondary | ICD-10-CM | POA: Diagnosis not present

## 2023-09-22 DIAGNOSIS — M25572 Pain in left ankle and joints of left foot: Secondary | ICD-10-CM | POA: Diagnosis not present

## 2023-09-22 DIAGNOSIS — M25571 Pain in right ankle and joints of right foot: Secondary | ICD-10-CM | POA: Diagnosis not present

## 2023-09-22 DIAGNOSIS — M79661 Pain in right lower leg: Secondary | ICD-10-CM | POA: Diagnosis not present

## 2023-09-22 NOTE — Patient Instructions (Signed)
Tylenol for day to day pain relief Recommend going to [employer] for help with inserts  Recommend physical activity such as water aerobic, elliptical, and stationary bike Call us if you would like a PT referral 6 week follow up

## 2023-09-26 ENCOUNTER — Encounter: Payer: Self-pay | Admitting: Internal Medicine

## 2023-09-26 ENCOUNTER — Ambulatory Visit: Payer: BC Managed Care – PPO | Admitting: Internal Medicine

## 2023-09-26 VITALS — BP 116/82 | HR 97 | Wt 195.0 lb

## 2023-09-26 DIAGNOSIS — Z800 Family history of malignant neoplasm of digestive organs: Secondary | ICD-10-CM | POA: Diagnosis not present

## 2023-09-26 DIAGNOSIS — Z860101 Personal history of adenomatous and serrated colon polyps: Secondary | ICD-10-CM | POA: Diagnosis not present

## 2023-09-26 DIAGNOSIS — Z1501 Genetic susceptibility to malignant neoplasm of breast: Secondary | ICD-10-CM | POA: Diagnosis not present

## 2023-09-26 DIAGNOSIS — Z860100 Personal history of colon polyps, unspecified: Secondary | ICD-10-CM

## 2023-09-26 MED ORDER — NA SULFATE-K SULFATE-MG SULF 17.5-3.13-1.6 GM/177ML PO SOLN: 1.0000 | ORAL | 0 refills | Status: DC

## 2023-09-26 NOTE — Patient Instructions (Signed)
_______________________________________________________  If your blood pressure at your visit was 140/90 or greater, please contact your primary care physician to follow up on this.  If you are age 53 or younger, your body mass index should be between 19-25. Your Body mass index is 33.47 kg/m. If this is out of the aformentioned range listed, please consider follow up with your Primary Care Provider.  ________________________________________________________  The [facility] providers would like to encourage you to use [facility] to communicate with providers for non-urgent requests or questions.  Due to long hold times on the telephone, sending your provider a message by [facility] may be a faster and more efficient way to get a response.  Please allow 48 business hours for a response.  Please remember that this is for non-urgent requests.  _______________________________________________________  Bonita Quin have been scheduled for a colonoscopy. Please follow written instructions given to you at your visit today.   Please pick up your prep supplies at the pharmacy within the next 1-3 days.  If you use inhalers (even only as needed), please bring them with you on the day of your procedure.  DO NOT TAKE 7 DAYS PRIOR TO TEST- Trulicity (dulaglutide) Ozempic, Wegovy (semaglutide) Mounjaro (tirzepatide) Bydureon Bcise (exanatide extended release)  DO NOT TAKE 1 DAY PRIOR TO YOUR TEST Rybelsus (semaglutide) Adlyxin (lixisenatide) Victoza (liraglutide) Byetta (exanatide) ___________________________________________________________________________  Due to recent changes in healthcare laws, you may see the results of your imaging and laboratory studies on MyChart before your provider has had a chance to review them.  We understand that in some cases there may be results that are confusing or concerning to you. Not all laboratory results come back in the same time frame and the provider may be waiting for  multiple results in order to interpret others.  Please give [place] 48 hours in order for your provider to thoroughly review all the results before contacting the office for clarification of your results.   Thank you for entrusting me with your care and choosing [facility].  Dr Rhea Belton

## 2023-09-26 NOTE — Progress Notes (Signed)
Subjective:    Patient ID: Nicole Gonzalez, female    DOB: [birth year], 52 y.o.   MRN: [id number]  HPI Nicole Gonzalez is a 52 year old female with a history of CDKN2A gene mutation with a family history and genetic predisposition for pancreatic cancer, history of adenomatous colon polyp, diverticulosis, hyperlipidemia, obesity on Mounjaro who is here for follow-up.  She was last seen in the office about 15 months ago.  She is feeling well.  She had her screening MRI/MRCP earlier this summer which showed no concerning pancreatic lesions.  She did have a prior EUS with Dr. Perry Mount 2020 which also was normal at that time.  No hepatobiliary complaint.  Regular bowel movements.  No blood in stool or melena.  No upper GI complaints.   Review of Systems As per HPI, otherwise negative  Current Medications, Allergies, Past Medical History, Past Surgical History, Family History and Social History were reviewed in [employer] record.    Objective:   Physical Exam BP 116/82   Pulse 97   Wt 195 lb (88.5 kg)   BMI 33.47 kg/m  Gen: awake, alert, NAD HEENT: anicteric  Neuro: nonfocal  CMP     Component Value Date/Time   NA 136 07/26/2023 1010   K 4.0 07/26/2023 1010   CL 100 07/26/2023 1010   CO2 27 07/26/2023 1010   GLUCOSE 83 07/26/2023 1010   GLUCOSE 89 09/29/2006 0947   BUN 15 07/26/2023 1010   CREATININE 0.59 07/26/2023 1010   CREATININE 0.57 08/26/2020 0714   CALCIUM 9.3 07/26/2023 1010   PROT 7.0 07/26/2023 1010   ALBUMIN 4.5 07/26/2023 1010   AST 20 07/26/2023 1010   ALT 23 07/26/2023 1010   ALKPHOS 50 07/26/2023 1010   BILITOT 0.7 07/26/2023 1010   GFR 102.92 07/26/2023 1010   GFRNONAA 108 08/26/2020 0714    MRI ABDOMEN WITHOUT AND WITH CONTRAST (INCLUDING MRCP)   TECHNIQUE: Multiplanar multisequence MR imaging of the abdomen was performed both before and after the administration of intravenous contrast. Heavily T2-weighted images of the biliary and  pancreatic ducts were obtained, and three-dimensional MRCP images were rendered by post processing.   CONTRAST:  9mL GADAVIST GADOBUTROL 1 MMOL/ML IV SOLN   COMPARISON:  Multiple priors including MRI abdomen June 01, 2022   FINDINGS: Lower chest: No acute abnormality.   Hepatobiliary: Hepatomegaly. Mild diffuse hepatic steatosis. No suspicious hepatic lesion gallbladder is unremarkable. No biliary ductal dilation   Pancreas: No pancreatic ductal dilation. Intrinsic T1 signal of the pancreatic parenchyma is within normal limits. Homogeneous enhancement of the pancreas postcontrast administration. No cystic or solid hyperenhancing pancreatic lesion identified.   Spleen:  No splenomegaly   Adrenals/Urinary Tract: Bilateral adrenal glands appear normal. No hydronephrosis.   Stomach/Bowel: Visualized portions within the abdomen are unremarkable.   Vascular/Lymphatic: No pathologically enlarged lymph nodes identified. No abdominal aortic aneurysm demonstrated.   Other:  None.   Musculoskeletal: No suspicious bone lesions identified.   IMPRESSION: 1. No pancreatic ductal dilation or discrete pancreatic lesion identified. 2. Hepatomegaly and mild diffuse hepatic steatosis.     Electronically Signed   By: Maudry Mayhew M.D.   On: 07/08/2023 10:16         Assessment & Plan:  52 year old female with a history of CDKN2A gene mutation with a family history and genetic predisposition for pancreatic cancer, history of adenomatous colon polyp, diverticulosis, hyperlipidemia, obesity on Mounjaro who is here for follow-up.    CDKN2A pathogenic gene mutation/family history  of pancreatic cancer in father --she has a 17% risk of pancreatic cancer over her lifetime due to her gene mutation.  We have been performing screening MRI and she had a screening EUS in 2020.  Given her risk and family history I do recommend repeated screening tests.  It would be reasonable to consider EUS next  summer at the 5-year mark from her last EUS.  I will discuss this with Dr. Meridee Score for his opinion -- Recently normal pancreas by MRI in July 2024 -- Possible EUS to be discussed with Dr. Meridee Score next summer 2025 -- Continue close follow-up with her dermatologist given risk of melanoma with this gene mutation -- She is also getting high risk breast cancer screening with MRI  2.  History of SSP and adenoma of colon --single SSP removed 5 years ago.  Surveillance colonoscopy recommended at this time.  We discussed the risk on benefits and alternatives and she is agreeable and wishes to proceed.  Will need to hold GLP-1 medication for at least 7 days prior to her colonoscopy -- Colonoscopy in [facility]  30 minutes total spent today including patient facing time, coordination of care, reviewing medical history/procedures/pertinent radiology studies, and documentation of the encounter.

## 2023-10-01 ENCOUNTER — Other Ambulatory Visit: Payer: Self-pay | Admitting: Internal Medicine

## 2023-10-05 ENCOUNTER — Telehealth: Payer: Self-pay | Admitting: Sports Medicine

## 2023-10-05 NOTE — Telephone Encounter (Signed)
Pt states Physical Therapy was discussed at last visit. She would like to proceed with PT, [place] and [facility] would be convenient.

## 2023-10-09 ENCOUNTER — Other Ambulatory Visit: Payer: Self-pay | Admitting: Sports Medicine

## 2023-10-09 DIAGNOSIS — G8929 Other chronic pain: Secondary | ICD-10-CM

## 2023-10-09 DIAGNOSIS — M79604 Pain in right leg: Secondary | ICD-10-CM

## 2023-10-09 NOTE — Progress Notes (Signed)
Referral placed.

## 2023-10-09 NOTE — Telephone Encounter (Signed)
New referral placed.

## 2023-10-10 ENCOUNTER — Telehealth: Payer: Self-pay

## 2023-10-10 NOTE — Telephone Encounter (Signed)
The pt has been advised of the recommendation. Recall has been entered. She will call if needed prior to that time

## 2023-10-10 NOTE — Telephone Encounter (Signed)
-----   Message from Carie Caddy Pyrtle sent at 10/09/2023  9:35 PM EDT ----- Agree Alexia Freestone, please place patient on list for EUS summer 2025 for high risk panc cancer screening Please make pt aware of this recommendation from Dr. Meridee Score Thanks JMP ----- Message ----- From: Lemar Lofty., MD Sent: 09/29/2023   4:23 AM EDT To: Beverley Fiedler, MD  JMP, If we are going to do true pancreatic screening we should be doing MRI 1 year EUS next year. Lets go ahead and plan to have her on for the summer. If you agree with this, then let me know and we can forward to [facility] and put her on the Cologuard list. Thanks. GM ----- Message ----- From: Beverley Fiedler, MD Sent: 09/26/2023   2:32 PM EDT To: Lemar Lofty., MD  Vicente Serene,  Would you consider and EUS for panc screening in summer/July 2025? This patient has an elevated risk of pancreatic cancer based on her gene mutation. She had EUS in 2020 and I have done intermittent MRIs since that time.  Do think EUS is more sensitive for her than MRI? Thank for your opinion JMP

## 2023-10-12 DIAGNOSIS — M25572 Pain in left ankle and joints of left foot: Secondary | ICD-10-CM | POA: Diagnosis not present

## 2023-10-12 DIAGNOSIS — M79604 Pain in right leg: Secondary | ICD-10-CM | POA: Diagnosis not present

## 2023-10-12 DIAGNOSIS — G8929 Other chronic pain: Secondary | ICD-10-CM | POA: Diagnosis not present

## 2023-10-12 DIAGNOSIS — M25571 Pain in right ankle and joints of right foot: Secondary | ICD-10-CM | POA: Diagnosis not present

## 2023-10-19 DIAGNOSIS — M79604 Pain in right leg: Secondary | ICD-10-CM | POA: Diagnosis not present

## 2023-10-19 DIAGNOSIS — M25571 Pain in right ankle and joints of right foot: Secondary | ICD-10-CM | POA: Diagnosis not present

## 2023-10-19 DIAGNOSIS — G8929 Other chronic pain: Secondary | ICD-10-CM | POA: Diagnosis not present

## 2023-10-19 DIAGNOSIS — M25572 Pain in left ankle and joints of left foot: Secondary | ICD-10-CM | POA: Diagnosis not present

## 2023-10-23 DIAGNOSIS — M25571 Pain in right ankle and joints of right foot: Secondary | ICD-10-CM | POA: Diagnosis not present

## 2023-10-23 DIAGNOSIS — M25572 Pain in left ankle and joints of left foot: Secondary | ICD-10-CM | POA: Diagnosis not present

## 2023-10-23 DIAGNOSIS — M79604 Pain in right leg: Secondary | ICD-10-CM | POA: Diagnosis not present

## 2023-10-23 DIAGNOSIS — G8929 Other chronic pain: Secondary | ICD-10-CM | POA: Diagnosis not present

## 2023-10-26 DIAGNOSIS — G8929 Other chronic pain: Secondary | ICD-10-CM | POA: Diagnosis not present

## 2023-10-26 DIAGNOSIS — M25571 Pain in right ankle and joints of right foot: Secondary | ICD-10-CM | POA: Diagnosis not present

## 2023-10-26 DIAGNOSIS — M25572 Pain in left ankle and joints of left foot: Secondary | ICD-10-CM | POA: Diagnosis not present

## 2023-10-26 DIAGNOSIS — M79604 Pain in right leg: Secondary | ICD-10-CM | POA: Diagnosis not present

## 2023-10-31 DIAGNOSIS — M79604 Pain in right leg: Secondary | ICD-10-CM | POA: Diagnosis not present

## 2023-10-31 DIAGNOSIS — M25571 Pain in right ankle and joints of right foot: Secondary | ICD-10-CM | POA: Diagnosis not present

## 2023-10-31 DIAGNOSIS — G8929 Other chronic pain: Secondary | ICD-10-CM | POA: Diagnosis not present

## 2023-10-31 DIAGNOSIS — M25572 Pain in left ankle and joints of left foot: Secondary | ICD-10-CM | POA: Diagnosis not present

## 2023-11-01 NOTE — Progress Notes (Unsigned)
Nicole Gonzalez D.[facility] [address] [place] [address] Phone: [phone]   Assessment and Plan:     There are no diagnoses linked to this encounter.  ***   Pertinent previous records reviewed include ***    Follow Up: ***     Subjective:   I, Nicole Gonzalez, am serving as a Neurosurgeon for Doctor Richardean Sale   Chief Complaint: right leg pain    HPI:    08/29/2023 Patient is a 52 year old female complaining of right leg pain. Patient states that she right knee to ankle pain . No swelling no bruisng . Advil doesn't help. Isnt able to sleep through the night. Long distance driving is painful and when she walks on pavement. She was told that her hip are not aligned. Intermittent pain for a few months. Pain has been increasing over the last week . Does endorse bilateral foot pain on the top of the foot    09/22/2023 Patient states that she has had small improvement. Meloxicam caused joint pain. She still feels as though something is not right . She is TTP above the ankle    11/02/2023 Patient states   Relevant Historical Information: Hypertension, DM type II Additional pertinent review of systems negative.   Current Outpatient Medications:    benzonatate (TESSALON) 100 MG capsule, Take 1 capsule (100 mg total) by mouth 3 (three) times daily as needed. (Patient not taking: Reported on 09/26/2023), Disp: 30 capsule, Rfl: 0   Cyanocobalamin (VITAMIN B-12) 1000 MCG SUBL, Place 1 tablet (1,000 mcg total) under the tongue daily., Disp: 90 tablet, Rfl: 3   fexofenadine (ALLEGRA) 180 MG tablet, Take 180 mg by mouth daily as needed (allergies.). In the morning , Disp: , Rfl:    glucose blood (CONTOUR NEXT TEST) test strip, 100 each by Other route See admin instructions. Use as instructed, Disp: 100 strip, Rfl: 3   ibuprofen (ADVIL,MOTRIN) 200 MG tablet, Take 600 mg by mouth every 8 (eight) hours as needed (pain)., Disp: , Rfl:     levocetirizine (XYZAL) 5 MG tablet, Take 5 mg by mouth every evening. , Disp: , Rfl:    lisinopril (ZESTRIL) 10 MG tablet, TAKE 1 TABLET(10 MG) BY MOUTH DAILY FOR HIGH BLOOD PRESSURE, Disp: 30 tablet, Rfl: 3   LORazepam (ATIVAN) 1 MG tablet, Take 1 tablet (1 mg total) by mouth as directed. 1 hour prior to your MRI appointment. DO NOT drive while taking this medication, Disp: 1 tablet, Rfl: 0   meloxicam (MOBIC) 15 MG tablet, Take 1 tablet (15 mg total) by mouth daily., Disp: 30 tablet, Rfl: 0   metFORMIN (GLUCOPHAGE-XR) 500 MG 24 hr tablet, TAKE 4 TABLETS BY MOUTH DAILY, Disp: 360 tablet, Rfl: 3   Microlet Lancets MISC, USE TO CHECK BLOOD SUGAR ONE TO TWO TIMES DAILY, Disp: 100 each, Rfl: 3   montelukast (SINGULAIR) 10 MG tablet, Take 10 mg by mouth every evening. , Disp: , Rfl:    Multiple Vitamin (MULTIVITAMIN WITH MINERALS) TABS tablet, Take 1 tablet by mouth daily., Disp: , Rfl:    Na Sulfate-K Sulfate-Mg Sulf (SUPREP BOWEL PREP KIT) 17.5-3.13-1.6 GM/177ML SOLN, Take 1 kit by mouth as directed., Disp: 324 mL, Rfl: 0   omeprazole (PRILOSEC) 20 MG capsule, Take 20 mg by mouth every evening. , Disp: , Rfl:    QNASL 80 MCG/ACT AERS, Place 2 sprays into the nose every evening. , Disp: , Rfl:    simvastatin (ZOCOR) 40  MG tablet, TAKE 1 TABLET(40 MG) BY MOUTH DAILY AT 6 PM, Disp: 90 tablet, Rfl: 1   tirzepatide (MOUNJARO) 10 MG/0.5ML Pen, Inject 10 mg into the skin once a week. Dosage change, Disp: 6 mL, Rfl: 2   Objective:     There were no vitals filed for this visit.    There is no height or weight on file to calculate BMI.    Physical Exam:    ***   Electronically signed by:  Nicole Gonzalez D.[facility] 7:26 AM 11/01/23

## 2023-11-02 ENCOUNTER — Ambulatory Visit: Payer: BC Managed Care – PPO | Admitting: Sports Medicine

## 2023-11-02 VITALS — BP 122/84 | HR 86 | Ht 64.0 in | Wt 191.0 lb

## 2023-11-02 DIAGNOSIS — M79604 Pain in right leg: Secondary | ICD-10-CM | POA: Diagnosis not present

## 2023-11-02 DIAGNOSIS — M25572 Pain in left ankle and joints of left foot: Secondary | ICD-10-CM | POA: Diagnosis not present

## 2023-11-02 DIAGNOSIS — G8929 Other chronic pain: Secondary | ICD-10-CM | POA: Diagnosis not present

## 2023-11-02 DIAGNOSIS — M6289 Other specified disorders of muscle: Secondary | ICD-10-CM

## 2023-11-02 DIAGNOSIS — S76011A Strain of muscle, fascia and tendon of right hip, initial encounter: Secondary | ICD-10-CM

## 2023-11-02 DIAGNOSIS — M25571 Pain in right ankle and joints of right foot: Secondary | ICD-10-CM | POA: Diagnosis not present

## 2023-11-02 NOTE — Patient Instructions (Signed)
Tylenol 500  mg 2-3 times a day for pain relief  Hamstring HEP  Continue PT  May use NSAID as needed for breakthrough pain but limit to 1-2 times per week 4-6 week follow up

## 2023-11-03 ENCOUNTER — Encounter: Payer: Self-pay | Admitting: Internal Medicine

## 2023-11-07 DIAGNOSIS — M25571 Pain in right ankle and joints of right foot: Secondary | ICD-10-CM | POA: Diagnosis not present

## 2023-11-07 DIAGNOSIS — M79604 Pain in right leg: Secondary | ICD-10-CM | POA: Diagnosis not present

## 2023-11-07 DIAGNOSIS — G8929 Other chronic pain: Secondary | ICD-10-CM | POA: Diagnosis not present

## 2023-11-07 DIAGNOSIS — M25572 Pain in left ankle and joints of left foot: Secondary | ICD-10-CM | POA: Diagnosis not present

## 2023-11-14 DIAGNOSIS — M25571 Pain in right ankle and joints of right foot: Secondary | ICD-10-CM | POA: Diagnosis not present

## 2023-11-14 DIAGNOSIS — M25572 Pain in left ankle and joints of left foot: Secondary | ICD-10-CM | POA: Diagnosis not present

## 2023-11-14 DIAGNOSIS — M79604 Pain in right leg: Secondary | ICD-10-CM | POA: Diagnosis not present

## 2023-11-14 DIAGNOSIS — G8929 Other chronic pain: Secondary | ICD-10-CM | POA: Diagnosis not present

## 2023-11-22 ENCOUNTER — Other Ambulatory Visit: Payer: Self-pay | Admitting: Family

## 2023-11-22 ENCOUNTER — Encounter: Payer: Self-pay | Admitting: Internal Medicine

## 2023-11-22 ENCOUNTER — Ambulatory Visit: Payer: BC Managed Care – PPO | Admitting: Internal Medicine

## 2023-11-22 ENCOUNTER — Other Ambulatory Visit: Payer: Self-pay | Admitting: Internal Medicine

## 2023-11-22 VITALS — BP 100/68 | HR 76 | Temp 98.3°F | Resp 12 | Ht 64.0 in | Wt 191.0 lb

## 2023-11-22 DIAGNOSIS — Z1211 Encounter for screening for malignant neoplasm of colon: Secondary | ICD-10-CM

## 2023-11-22 DIAGNOSIS — K5730 Diverticulosis of large intestine without perforation or abscess without bleeding: Secondary | ICD-10-CM | POA: Diagnosis not present

## 2023-11-22 DIAGNOSIS — K648 Other hemorrhoids: Secondary | ICD-10-CM | POA: Diagnosis not present

## 2023-11-22 DIAGNOSIS — D125 Benign neoplasm of sigmoid colon: Secondary | ICD-10-CM | POA: Diagnosis not present

## 2023-11-22 DIAGNOSIS — Z860100 Personal history of colon polyps, unspecified: Secondary | ICD-10-CM

## 2023-11-22 MED ORDER — SODIUM CHLORIDE 0.9 % IV SOLN: 500.0000 mL | INTRAVENOUS | Status: DC

## 2023-11-22 NOTE — Patient Instructions (Signed)
Resume all of your previous medications today as ordered today.  Read your discharge instructions.  YOU HAD AN ENDOSCOPIC PROCEDURE TODAY AT THE [facility]:   Refer to the procedure report that was given to you for any specific questions about what was found during the examination.  If the procedure report does not answer your questions, please call your gastroenterologist to clarify.  If you requested that your care partner not be given the details of your procedure findings, then the procedure report has been included in a sealed envelope for you to review at your convenience later.  YOU SHOULD EXPECT: Some feelings of bloating in the abdomen. Passage of more gas than usual.  Walking can help get rid of the air that was put into your GI tract during the procedure and reduce the bloating. If you had a lower endoscopy (such as a colonoscopy or flexible sigmoidoscopy) you may notice spotting of blood in your stool or on the toilet paper. If you underwent a bowel prep for your procedure, you may not have a normal bowel movement for a few days.  Please Note:  You might notice some irritation and congestion in your nose or some drainage.  This is from the oxygen used during your procedure.  There is no need for concern and it should clear up in a day or so.  SYMPTOMS TO REPORT IMMEDIATELY:  Following lower endoscopy (colonoscopy or flexible sigmoidoscopy):  Excessive amounts of blood in the stool  Significant tenderness or worsening of abdominal pains  Swelling of the abdomen that is new, acute  Fever of 100F or higher  For urgent or emergent issues, a gastroenterologist can be reached at any hour by calling [phone]. Do not use MyChart messaging for urgent concerns.    DIET:  We do recommend a small meal at first, but then you may proceed to your regular diet.  Drink plenty of fluids but you should avoid alcoholic beverages for 24 hours.  ACTIVITY:  You should plan to take it  easy for the rest of today and you should NOT DRIVE or use heavy machinery until tomorrow (because of the sedation medicines used during the test).    FOLLOW UP: Our staff will call the number listed on your records the next business day following your procedure.  We will call around 7:15- 8:00 am to check on you and address any questions or concerns that you may have regarding the information given to you following your procedure. If we do not reach you, we will leave a message.     If any biopsies were taken you will be contacted by phone or by letter within the next 1-3 weeks.  Please call us at [phone] if you have not heard about the biopsies in 3 weeks.    SIGNATURES/CONFIDENTIALITY: You and/or your care partner have signed paperwork which will be entered into your electronic medical record.  These signatures attest to the fact that that the information above on your After Visit Summary has been reviewed and is understood.  Full responsibility of the confidentiality of this discharge information lies with you and/or your care-partner.

## 2023-11-22 NOTE — Progress Notes (Signed)
GASTROENTEROLOGY PROCEDURE H&P NOTE   Primary Care Physician: Madelin Headings, MD    Reason for Procedure:  History of adenomatous and sessile serrated polyp  Plan:    Colonoscopy  Patient is appropriate for endoscopic procedure(s) in the ambulatory ([facility]) setting.  The nature of the procedure, as well as the risks, benefits, and alternatives were carefully and thoroughly reviewed with the patient. Ample time for discussion and questions allowed. The patient understood, was satisfied, and agreed to proceed.     HPI: Nicole Gonzalez is a 52 y.o. female who presents for surveillance colonoscopy.  Medical history as below.  Tolerated the prep.  No recent chest pain or shortness of breath.  No abdominal pain today.  Past Medical History:  Diagnosis Date   Anemia     better after hysterectomy   Cancer (HCC)    basil cell carcinoma/forehead   Diabetes mellitus without complication (HCC)    DIABETES MELLITUS, GESTATIONAL, HX OF 06/02/2009   Qualifier: Diagnosis of  By: Fabian Sharp MD, Neta Mends    Diverticulosis    Family history of breast cancer    Family history of genetic disease carrier    father has CDKN2A and ATM mutation   Family history of melanoma    Family history of pancreatic cancer    Fracture of lower leg     fall 2011   GERD (gastroesophageal reflux disease)    History of basal cell carcinoma excision    History of gestational diabetes    x2   HTN (hypertension)    Hyperlipidemia    [id number] trx    Increased frequency of headaches 03/15/2012   Internal hemorrhoids    Obesity    Seizures (HCC)    as an infant d/t fever   Tubular adenoma of colon    UNSPECIFIED TACHYCARDIA 09/01/2010   Qualifier: Diagnosis of  By: Abner Greenspan MD, Misty Stanley      Past Surgical History:  Procedure Laterality Date   ABDOMINAL HYSTERECTOMY  06/29/2010    question had abnormal cells on Path, fibroids   BASAL CELL CARCINOMA EXCISION     BREAST BIOPSY Right 2009   COLONOSCOPY WITH PROPOFOL  N/A 01/17/2019   Procedure: COLONOSCOPY WITH PROPOFOL;  Surgeon: Rachael Fee, MD;  Location: [facility];  Service: Endoscopy;  Laterality: N/A;   ESOPHAGOGASTRODUODENOSCOPY (EGD) WITH PROPOFOL N/A 01/17/2019   Procedure: ESOPHAGOGASTRODUODENOSCOPY (EGD) WITH PROPOFOL;  Surgeon: Rachael Fee, MD;  Location: [facility];  Service: Endoscopy;  Laterality: N/A;   EUS N/A 01/17/2019   Procedure: UPPER ENDOSCOPIC ULTRASOUND (EUS) RADIAL;  Surgeon: Rachael Fee, MD;  Location: [facility];  Service: Endoscopy;  Laterality: N/A;   POLYPECTOMY  01/17/2019   Procedure: POLYPECTOMY;  Surgeon: Rachael Fee, MD;  Location: [facility];  Service: Endoscopy;;   TONSILLECTOMY      Prior to Admission medications   Medication Sig Start Date End Date Taking? Authorizing Provider  glucose blood (CONTOUR NEXT TEST) test strip 100 each by Other route See admin instructions. Use as instructed 07/26/23  Yes Panosh, Neta Mends, MD  levocetirizine (XYZAL) 5 MG tablet Take 5 mg by mouth every evening.  08/14/14  Yes [provider]  lisinopril (ZESTRIL) 10 MG tablet TAKE 1 TABLET(10 MG) BY MOUTH DAILY FOR HIGH BLOOD PRESSURE 10/01/23  Yes Worthy Rancher B, FNP  metFORMIN (GLUCOPHAGE-XR) 500 MG 24 hr tablet TAKE 4 TABLETS BY MOUTH DAILY 07/26/23  Yes Panosh, Neta Mends, MD  Microlet Lancets MISC USE  TO CHECK BLOOD SUGAR ONE TO TWO TIMES DAILY 07/26/23  Yes Panosh, Neta Mends, MD  montelukast (SINGULAIR) 10 MG tablet Take 10 mg by mouth every evening.  01/26/13  Yes Eileen Stanford, MD  Multiple Vitamin (MULTIVITAMIN WITH MINERALS) TABS tablet Take 1 tablet by mouth daily.   Yes [provider]  omeprazole (PRILOSEC) 20 MG capsule Take 20 mg by mouth every evening.    Yes [provider]  QNASL 80 MCG/ACT AERS Place 2 sprays into the nose every evening.  03/15/13  Yes Eileen Stanford, MD  simvastatin (ZOCOR) 40 MG tablet TAKE 1 TABLET(40 MG) BY MOUTH DAILY AT 6 PM 06/02/23  Yes Panosh, Neta Mends, MD   benzonatate (TESSALON) 100 MG capsule Take 1 capsule (100 mg total) by mouth 3 (three) times daily as needed. 12/19/22   Margaretann Loveless, PA-C  Cyanocobalamin (VITAMIN B-12) 1000 MCG SUBL Place 1 tablet (1,000 mcg total) under the tongue daily. 03/10/22   Panosh, Neta Mends, MD  fexofenadine (ALLEGRA) 180 MG tablet Take 180 mg by mouth daily as needed (allergies.). In the morning     [provider]  ibuprofen (ADVIL,MOTRIN) 200 MG tablet Take 600 mg by mouth every 8 (eight) hours as needed (pain).    [provider]  LORazepam (ATIVAN) 1 MG tablet Take 1 tablet (1 mg total) by mouth as directed. 1 hour prior to your MRI appointment. DO NOT drive while taking this medication 07/05/23   Esterwood, Amy S, PA-C  meloxicam (MOBIC) 15 MG tablet Take 1 tablet (15 mg total) by mouth daily. 08/29/23   Richardean Sale, DO  Na Sulfate-K Sulfate-Mg Sulf (SUPREP BOWEL PREP KIT) 17.5-3.13-1.6 GM/177ML SOLN Take 1 kit by mouth as directed. 09/26/23   Kelsa Jaworowski, Carie Caddy, MD  tirzepatide [facility]) 10 MG/0.5ML Pen Inject 10 mg into the skin once a week. Dosage change 07/26/23   Panosh, Neta Mends, MD    Current Outpatient Medications  Medication Sig Dispense Refill   glucose blood (CONTOUR NEXT TEST) test strip 100 each by Other route See admin instructions. Use as instructed 100 strip 3   levocetirizine (XYZAL) 5 MG tablet Take 5 mg by mouth every evening.      lisinopril (ZESTRIL) 10 MG tablet TAKE 1 TABLET(10 MG) BY MOUTH DAILY FOR HIGH BLOOD PRESSURE 30 tablet 3   metFORMIN (GLUCOPHAGE-XR) 500 MG 24 hr tablet TAKE 4 TABLETS BY MOUTH DAILY 360 tablet 3   Microlet Lancets MISC USE TO CHECK BLOOD SUGAR ONE TO TWO TIMES DAILY 100 each 3   montelukast (SINGULAIR) 10 MG tablet Take 10 mg by mouth every evening.      Multiple Vitamin (MULTIVITAMIN WITH MINERALS) TABS tablet Take 1 tablet by mouth daily.     omeprazole (PRILOSEC) 20 MG capsule Take 20 mg by mouth every evening.      QNASL 80 MCG/ACT AERS  Place 2 sprays into the nose every evening.      simvastatin (ZOCOR) 40 MG tablet TAKE 1 TABLET(40 MG) BY MOUTH DAILY AT 6 PM 90 tablet 1   benzonatate (TESSALON) 100 MG capsule Take 1 capsule (100 mg total) by mouth 3 (three) times daily as needed. 30 capsule 0   Cyanocobalamin (VITAMIN B-12) 1000 MCG SUBL Place 1 tablet (1,000 mcg total) under the tongue daily. 90 tablet 3   fexofenadine (ALLEGRA) 180 MG tablet Take 180 mg by mouth daily as needed (allergies.). In the morning      ibuprofen (ADVIL,MOTRIN) 200 MG tablet Take  600 mg by mouth every 8 (eight) hours as needed (pain).     LORazepam (ATIVAN) 1 MG tablet Take 1 tablet (1 mg total) by mouth as directed. 1 hour prior to your MRI appointment. DO NOT drive while taking this medication 1 tablet 0   meloxicam (MOBIC) 15 MG tablet Take 1 tablet (15 mg total) by mouth daily. 30 tablet 0   Na Sulfate-K Sulfate-Mg Sulf (SUPREP BOWEL PREP KIT) 17.5-3.13-1.6 GM/177ML SOLN Take 1 kit by mouth as directed. 324 mL 0   tirzepatide (MOUNJARO) 10 MG/0.5ML Pen Inject 10 mg into the skin once a week. Dosage change 6 mL 2   Current Facility-Administered Medications  Medication Dose Route Frequency Provider Last Rate Last Admin   0.9 %  sodium chloride infusion  500 mL Intravenous Continuous Corrin Hingle, Carie Caddy, MD        Allergies as of 11/22/2023 - Review Complete 11/22/2023  Allergen Reaction Noted   Contrast media [iodinated contrast media] Other (See Comments) 04/25/2013   Adhesive [tape]  09/24/2014   Other  03/07/2011   Polymyxin b-trimethoprim Swelling 01/31/2023   Stadol [butorphanol]  04/25/2013    Family History  Problem Relation Age of Onset   Melanoma Father        x 3 times, first in 66's   Hypertension Father    Hyperlipidemia Father    Squamous cell carcinoma Father    Colon polyps Father    Heart disease Father    Pancreatic cancer Father 40       CDKN2A muation   Arthritis Brother         Psoriatic   Breast cancer Brother 34        ATM + mutation? or VUS   Diabetes Maternal Grandmother    Hyperlipidemia Maternal Grandmother    Heart disease Maternal Grandmother    Skin cancer Maternal Grandfather    Pancreatic cancer Paternal Grandmother 74       met liver   Heart disease Paternal Grandfather    Skin cancer Cousin    Breast cancer Other    Colon cancer Neg Hx    Stomach cancer Neg Hx    Esophageal cancer Neg Hx     Social History   Socioeconomic History   Marital status: Married    Spouse name: Not on file   Number of children: 2   Years of education: Not on file   Highest education level: Not on file  Occupational History   Occupation: Government social research officer: [employer]  Tobacco Use   Smoking status: Never   Smokeless tobacco: Never  Vaping Use   Vaping status: Never Used  Substance and Sexual Activity   Alcohol use: No   Drug use: No   Sexual activity: Not on file  Other Topics Concern   Not on file  Social History Narrative    Household of 4 -2 children   Son   to college     no pets     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    Nonsmoker married   Social Determinants of Corporate investment banker Strain: Not on file  Food Insecurity: No Food Insecurity (07/26/2023)   Hunger Vital Sign    Worried About Running Out of Food in the Last Year: Never true    Ran Out of Food in the Last Year: Never true  Transportation Needs: Not on  file  Physical Activity: Sufficiently Active (07/26/2023)   Exercise Vital Sign    Days of Exercise per Week: 7 days    Minutes of Exercise per Session: 30 min  Stress: Stress Concern Present (07/26/2023)   [employer] of Occupational Health - Occupational Stress Questionnaire    Feeling of Stress : To some extent  Social Connections: Socially Integrated (07/26/2023)   Social Connection and Isolation Panel [NHANES]    Frequency of Communication with Friends and Family: More than three times a week     Frequency of Social Gatherings with Friends and Family: More than three times a week    Attends Religious Services: More than 4 times per year    Active Member of [employer] or Organizations: Yes    Attends Engineer, structural: More than 4 times per year    Marital Status: Married  Catering manager Violence: Not At Risk (07/26/2023)   Humiliation, Afraid, Rape, and Kick questionnaire    Fear of Current or Ex-Partner: No    Emotionally Abused: No    Physically Abused: No    Sexually Abused: No    Physical Exam: Vital signs in last 24 hours: @BP  103/68   Pulse 85   Temp 98.3 F (36.8 C)   Ht 5\' 4"  (1.626 m)   Wt 191 lb (86.6 kg)   SpO2 95%   BMI 32.79 kg/m  GEN: NAD EYE: Sclerae anicteric ENT: MMM CV: Non-tachycardic Pulm: CTA b/l GI: Soft, NT/ND NEURO:  Alert & Oriented x 3   Erick Blinks, MD [facility]  11/22/2023 10:08 AM

## 2023-11-22 NOTE — Progress Notes (Signed)
Pt's states no medical or surgical changes since previsit or office visit. 

## 2023-11-22 NOTE — Progress Notes (Signed)
Report to PACU, RN, vss, BBS= Clear.  

## 2023-11-22 NOTE — Progress Notes (Signed)
Called to room to assist during endoscopic procedure.  Patient ID and intended procedure confirmed with present staff. Received instructions for my participation in the procedure from the performing physician.  

## 2023-11-22 NOTE — Op Note (Signed)
[facility] Patient Name: Nicole Gonzalez Procedure Date: 11/22/2023 10:13 AM MRN: [id number] Endoscopist: Beverley Fiedler , MD, [id number] Age: 52 Referring MD:  Date of Birth: 02-07-1971 Gender: Female Account #: [id number] Procedure:                Colonoscopy Indications:              High risk colon cancer surveillance: Personal                            history of sessile serrated colon polyp (less than                            10 mm in size) with no dysplasia, Last colonoscopy:                            February 2020 (SSP x1), 2014 (TA x2) Medicines:                Monitored Anesthesia Care Procedure:                Pre-Anesthesia Assessment:                           - Prior to the procedure, a History and Physical                            was performed, and patient medications and                            allergies were reviewed. The patient's tolerance of                            previous anesthesia was also reviewed. The risks                            and benefits of the procedure and the sedation                            options and risks were discussed with the patient.                            All questions were answered, and informed consent                            was obtained. Prior Anticoagulants: The patient has                            taken no anticoagulant or antiplatelet agents. ASA                            Grade Assessment: II - A patient with mild systemic                            disease. After reviewing the risks and benefits,  the patient was deemed in satisfactory condition to                            undergo the procedure.                           After obtaining informed consent, the colonoscope                            was passed under direct vision. Throughout the                            procedure, the patient's blood pressure, pulse, and                            oxygen saturations were  monitored continuously. The                            Olympus Scope SN: [id number] was introduced through                            the anus and advanced to the cecum, identified by                            appendiceal orifice and ileocecal valve. The                            colonoscopy was performed without difficulty. The                            patient tolerated the procedure well. The quality                            of the bowel preparation was good. The ileocecal                            valve, appendiceal orifice, and rectum were                            photographed. Scope In: 10:16:03 AM Scope Out: 10:27:18 AM Scope Withdrawal Time: 0 hours 8 minutes 41 seconds  Total Procedure Duration: 0 hours 11 minutes 15 seconds  Findings:                 The digital rectal exam was normal.                           Two sessile polyps were found in the sigmoid colon.                            The polyps were 4 to 5 mm in size. These polyps                            were removed with a cold snare. Resection and  retrieval were complete.                           Multiple medium-mouthed and small-mouthed                            diverticula were found in the sigmoid colon and                            descending colon.                           Internal hemorrhoids were found during                            retroflexion. The hemorrhoids were small. Complications:            No immediate complications. Estimated Blood Loss:     Estimated blood loss was minimal. Impression:               - Two 4 to 5 mm polyps in the sigmoid colon,                            removed with a cold snare. Resected and retrieved.                           - Mild diverticulosis in the sigmoid colon and in                            the descending colon.                           - Small internal hemorrhoids. Recommendation:           - Patient has a contact number  available for                            emergencies. The signs and symptoms of potential                            delayed complications were discussed with the                            patient. Return to normal activities tomorrow.                            Written discharge instructions were provided to the                            patient.                           - Resume previous diet.                           - Continue present medications.                           -  Await pathology results.                           - Repeat colonoscopy is recommended for                            surveillance. The colonoscopy date will be                            determined after pathology results from today's                            exam become available for review. Beverley Fiedler, MD 11/22/2023 10:29:44 AM This report has been signed electronically.

## 2023-11-23 ENCOUNTER — Telehealth: Payer: Self-pay

## 2023-11-23 NOTE — Telephone Encounter (Signed)
  Follow up Call-     11/22/2023    9:30 AM  Call back number  Post procedure Call Back phone  # [phone]  Permission to leave phone message Yes     Patient questions:  Do you have a fever, pain , or abdominal swelling? No. Pain Score  0 *  Have you tolerated food without any problems? Yes.    Have you been able to return to your normal activities? Yes.    Do you have any questions about your discharge instructions: Diet   No. Medications  No. Follow up visit  No.  Do you have questions or concerns about your Care? No.  Actions: * If pain score is 4 or above: No action needed, pain <4.

## 2023-11-24 ENCOUNTER — Encounter: Payer: Self-pay | Admitting: Internal Medicine

## 2023-11-24 LAB — SURGICAL PATHOLOGY

## 2023-11-27 ENCOUNTER — Encounter: Payer: Self-pay | Admitting: Internal Medicine

## 2023-11-30 ENCOUNTER — Ambulatory Visit: Payer: BC Managed Care – PPO | Admitting: Sports Medicine

## 2023-11-30 NOTE — Progress Notes (Addendum)
Nicole Gonzalez D.[facility] [address] [place] [address] Phone: [phone]   Assessment and Plan:     1. Right leg pain 2. Hamstring tightness of right lower extremity 3. Muscle strain of right gluteal region, subsequent encounter  -Chronic with exacerbation, subsequent visit - Essentially no change in symptoms with continued pain along right gluteal musculature and proximal hamstring - We will continue with prolonged conservative course including HEP, physical therapy, Tylenol as needed for day-to-day pain relief.  In addition to this plan recommend starting Voltaren gel topically over areas of pain and asking physical therapy about dry needling - New specific HEP for gluteal musculature provided - Patient completed 3-week course of meloxicam 15 mg daily in September 2024 with continued symptoms.  Do not recommend prednisone use due to past medical history DM type II  15 additional minutes spent for educating Therapeutic Home Exercise Program.  This included exercises focusing on stretching, strengthening, with focus on eccentric aspects.   Long term goals include an improvement in range of motion, strength, endurance as well as avoiding reinjury. Patient's frequency would include in 1-2 times a day, 3-5 times a week for a duration of 6-12 weeks. Proper technique shown and discussed handout in great detail with ATC.  All questions were discussed and answered.    Pertinent previous records reviewed include none  Follow Up: 4 to 6 weeks for reevaluation.  If no improvement or worsening of symptoms, could obtain right hip x-ray, discuss OMT versus greater trochanteric/gluteal tendon CSI, or repeat NSAID course   Subjective:   I, Nicole Gonzalez, am serving as a Neurosurgeon for Doctor Richardean Sale   Chief Complaint: right leg pain    HPI:    08/29/2023 Patient is a 52 year old female complaining of right leg pain. Patient states that she right  knee to ankle pain . No swelling no bruisng . Advil doesn't help. Isnt able to sleep through the night. Long distance driving is painful and when she walks on pavement. She was told that her hip are not aligned. Intermittent pain for a few months. Pain has been increasing over the last week . Does endorse bilateral foot pain on the top of the foot    09/22/2023 Patient states that she has had small improvement. Meloxicam caused joint pain. She still feels as though something is not right . She is TTP above the ankle     11/02/2023 Patient states still has pain when she drives long distances   12/01/2023 Patient states that she is the same . Thanksgiving weekend had a spasm down the leg . She had a flare of the left knee after PT but both legs were worked on   Relevant Historical Information: Hypertension, DM type II  Additional pertinent review of systems negative.   Current Outpatient Medications:    benzonatate (TESSALON) 100 MG capsule, Take 1 capsule (100 mg total) by mouth 3 (three) times daily as needed., Disp: 30 capsule, Rfl: 0   Cyanocobalamin (VITAMIN B-12) 1000 MCG SUBL, Place 1 tablet (1,000 mcg total) under the tongue daily., Disp: 90 tablet, Rfl: 3   fexofenadine (ALLEGRA) 180 MG tablet, Take 180 mg by mouth daily as needed (allergies.). In the morning , Disp: , Rfl:    glucose blood (CONTOUR NEXT TEST) test strip, 100 each by Other route See admin instructions. Use as instructed, Disp: 100 strip, Rfl: 3   ibuprofen (ADVIL,MOTRIN) 200 MG tablet, Take 600 mg by  mouth every 8 (eight) hours as needed (pain)., Disp: , Rfl:    levocetirizine (XYZAL) 5 MG tablet, Take 5 mg by mouth every evening. , Disp: , Rfl:    lisinopril (ZESTRIL) 10 MG tablet, TAKE 1 TABLET(10 MG) BY MOUTH DAILY FOR HIGH BLOOD PRESSURE, Disp: 30 tablet, Rfl: 3   LORazepam (ATIVAN) 1 MG tablet, Take 1 tablet (1 mg total) by mouth as directed. 1 hour prior to your MRI appointment. DO NOT drive while taking this  medication, Disp: 1 tablet, Rfl: 0   meloxicam (MOBIC) 15 MG tablet, Take 1 tablet (15 mg total) by mouth daily., Disp: 30 tablet, Rfl: 0   metFORMIN (GLUCOPHAGE-XR) 500 MG 24 hr tablet, TAKE 4 TABLETS BY MOUTH DAILY, Disp: 360 tablet, Rfl: 3   Microlet Lancets MISC, USE TO CHECK BLOOD SUGAR ONE TO TWO TIMES DAILY, Disp: 100 each, Rfl: 3   montelukast (SINGULAIR) 10 MG tablet, Take 10 mg by mouth every evening. , Disp: , Rfl:    Multiple Vitamin (MULTIVITAMIN WITH MINERALS) TABS tablet, Take 1 tablet by mouth daily., Disp: , Rfl:    Na Sulfate-K Sulfate-Mg Sulf (SUPREP BOWEL PREP KIT) 17.5-3.13-1.6 GM/177ML SOLN, Take 1 kit by mouth as directed., Disp: 324 mL, Rfl: 0   omeprazole (PRILOSEC) 20 MG capsule, Take 20 mg by mouth every evening. , Disp: , Rfl:    QNASL 80 MCG/ACT AERS, Place 2 sprays into the nose every evening. , Disp: , Rfl:    simvastatin (ZOCOR) 40 MG tablet, TAKE 1 TABLET(40 MG) BY MOUTH DAILY AT 6 PM, Disp: 90 tablet, Rfl: 1   tirzepatide (MOUNJARO) 10 MG/0.5ML Pen, Inject 10 mg into the skin once a week. Dosage change, Disp: 6 mL, Rfl: 2   Objective:     Vitals:   12/01/23 0808  BP: 124/68  Pulse: 91  SpO2: 98%  Weight: 189 lb (85.7 kg)  Height: 5\' 4"  (1.626 m)      Body mass index is 32.44 kg/m.    Physical Exam:    General: awake, alert, and oriented no acute distress, nontoxic Skin: no suspicious lesions or rashes Neuro:sensation intact distally with no deficits, normal muscle tone, no atrophy, strength 5/5 in all tested lower ext groups Psych: normal mood and affect, speech clear   Right hip: No deformity, swelling or wasting ROM Flexion 90, ext 30, IR 45, ER 45 TTP moderately gluteal musculature, mildly greater trochanter, IT band NTTP over the hip flexors,  si joint, lumbar spine Negative log roll with FROM Negative FABER Negative FADIR Negative Piriformis test for radicular symptoms, though reproduced   tension Negative trendelenberg Gait normal     Electronically signed by:  Nicole Gonzalez D.[facility] 8:30 AM 12/01/23

## 2023-12-01 ENCOUNTER — Ambulatory Visit (INDEPENDENT_AMBULATORY_CARE_PROVIDER_SITE_OTHER): Payer: BC Managed Care – PPO | Admitting: Sports Medicine

## 2023-12-01 VITALS — BP 124/68 | HR 91 | Ht 64.0 in | Wt 189.0 lb

## 2023-12-01 DIAGNOSIS — M6289 Other specified disorders of muscle: Secondary | ICD-10-CM | POA: Diagnosis not present

## 2023-12-01 DIAGNOSIS — M79604 Pain in right leg: Secondary | ICD-10-CM | POA: Diagnosis not present

## 2023-12-01 DIAGNOSIS — S76011D Strain of muscle, fascia and tendon of right hip, subsequent encounter: Secondary | ICD-10-CM

## 2023-12-01 NOTE — Addendum Note (Signed)
Addended by: Richardean Sale on: 12/01/2023 08:32 AM   Modules accepted: Level of Service

## 2023-12-01 NOTE — Patient Instructions (Signed)
Glute HEP  Voltaren gel over areas of pain Continue PT and ask about dry needling  4-6 week follow up

## 2023-12-05 DIAGNOSIS — M25571 Pain in right ankle and joints of right foot: Secondary | ICD-10-CM | POA: Diagnosis not present

## 2023-12-05 DIAGNOSIS — M79604 Pain in right leg: Secondary | ICD-10-CM | POA: Diagnosis not present

## 2023-12-05 DIAGNOSIS — G8929 Other chronic pain: Secondary | ICD-10-CM | POA: Diagnosis not present

## 2023-12-05 DIAGNOSIS — M25572 Pain in left ankle and joints of left foot: Secondary | ICD-10-CM | POA: Diagnosis not present

## 2023-12-07 DIAGNOSIS — M25571 Pain in right ankle and joints of right foot: Secondary | ICD-10-CM | POA: Diagnosis not present

## 2023-12-07 DIAGNOSIS — M25572 Pain in left ankle and joints of left foot: Secondary | ICD-10-CM | POA: Diagnosis not present

## 2023-12-07 DIAGNOSIS — M79604 Pain in right leg: Secondary | ICD-10-CM | POA: Diagnosis not present

## 2023-12-07 DIAGNOSIS — G8929 Other chronic pain: Secondary | ICD-10-CM | POA: Diagnosis not present

## 2023-12-08 ENCOUNTER — Other Ambulatory Visit: Payer: Self-pay | Admitting: Internal Medicine

## 2023-12-18 DIAGNOSIS — M25571 Pain in right ankle and joints of right foot: Secondary | ICD-10-CM | POA: Diagnosis not present

## 2023-12-18 DIAGNOSIS — G8929 Other chronic pain: Secondary | ICD-10-CM | POA: Diagnosis not present

## 2023-12-18 DIAGNOSIS — M79604 Pain in right leg: Secondary | ICD-10-CM | POA: Diagnosis not present

## 2023-12-18 DIAGNOSIS — M25572 Pain in left ankle and joints of left foot: Secondary | ICD-10-CM | POA: Diagnosis not present

## 2023-12-28 ENCOUNTER — Other Ambulatory Visit: Payer: Self-pay | Admitting: Sports Medicine

## 2023-12-28 ENCOUNTER — Telehealth: Payer: Self-pay | Admitting: Sports Medicine

## 2023-12-28 DIAGNOSIS — M6289 Other specified disorders of muscle: Secondary | ICD-10-CM

## 2023-12-28 DIAGNOSIS — M79604 Pain in right leg: Secondary | ICD-10-CM

## 2023-12-28 DIAGNOSIS — G8929 Other chronic pain: Secondary | ICD-10-CM

## 2023-12-28 DIAGNOSIS — S76011D Strain of muscle, fascia and tendon of right hip, subsequent encounter: Secondary | ICD-10-CM

## 2023-12-28 DIAGNOSIS — S76011A Strain of muscle, fascia and tendon of right hip, initial encounter: Secondary | ICD-10-CM

## 2023-12-28 NOTE — Telephone Encounter (Signed)
Patient called asking if a referral could be sent in for her to have dry needling. Where she is currently going, they do not do it there.  She mentioned [facility]?  Please advise.

## 2023-12-28 NOTE — Telephone Encounter (Signed)
New referral placed.

## 2023-12-29 ENCOUNTER — Other Ambulatory Visit: Payer: Self-pay | Admitting: Internal Medicine

## 2023-12-29 DIAGNOSIS — Z1231 Encounter for screening mammogram for malignant neoplasm of breast: Secondary | ICD-10-CM

## 2024-01-01 ENCOUNTER — Ambulatory Visit: Payer: BC Managed Care – PPO | Admitting: Sports Medicine

## 2024-01-02 ENCOUNTER — Ambulatory Visit: Payer: BC Managed Care – PPO | Admitting: Physical Therapy

## 2024-01-02 ENCOUNTER — Other Ambulatory Visit: Payer: Self-pay

## 2024-01-02 ENCOUNTER — Encounter: Payer: Self-pay | Admitting: Physical Therapy

## 2024-01-02 DIAGNOSIS — M79604 Pain in right leg: Secondary | ICD-10-CM

## 2024-01-02 DIAGNOSIS — M6281 Muscle weakness (generalized): Secondary | ICD-10-CM

## 2024-01-02 DIAGNOSIS — R29898 Other symptoms and signs involving the musculoskeletal system: Secondary | ICD-10-CM | POA: Diagnosis not present

## 2024-01-02 NOTE — Therapy (Addendum)
OUTPATIENT PHYSICAL THERAPY LOWER EXTREMITY EVALUATION   Patient Name: Nicole Gonzalez MRN: [id number] DOB:[birth year], 53 y.o., female Today's Date: 01/02/2024  END OF SESSION:  PT End of Session - 01/02/24 1304     Visit Number 1    Number of Visits 13    Date for PT Re-Evaluation 02/13/24    Authorization Type [employer] PPO    Authorization Time Period 01/02/24 to 02/13/24    PT Start Time 1302    PT Stop Time 1345    PT Time Calculation (min) 43 min    Activity Tolerance Patient tolerated treatment well    Behavior During Therapy WFL for tasks assessed/performed             Past Medical History:  Diagnosis Date   Anemia     better after hysterectomy   Cancer (HCC)    basil cell carcinoma/forehead   Diabetes mellitus without complication (HCC)    DIABETES MELLITUS, GESTATIONAL, HX OF 06/02/2009   Qualifier: Diagnosis of  By: Fabian Sharp MD, Neta Mends    Diverticulosis    Family history of breast cancer    Family history of genetic disease carrier    father has CDKN2A and ATM mutation   Family history of melanoma    Family history of pancreatic cancer    Fracture of lower leg     fall 2011   GERD (gastroesophageal reflux disease)    History of basal cell carcinoma excision    History of gestational diabetes    x2   HTN (hypertension)    Hyperlipidemia    [id number] 043 trx    Increased frequency of headaches 03/15/2012   Internal hemorrhoids    Obesity    Seizures (HCC)    as an infant d/t fever   Tubular adenoma of colon    UNSPECIFIED TACHYCARDIA 09/01/2010   Qualifier: Diagnosis of  By: Abner Greenspan MD, Misty Stanley     Past Surgical History:  Procedure Laterality Date   ABDOMINAL HYSTERECTOMY  06/29/2010    question had abnormal cells on Path, fibroids   BASAL CELL CARCINOMA EXCISION     BREAST BIOPSY Right 2009   COLONOSCOPY WITH PROPOFOL N/A 01/17/2019   Procedure: COLONOSCOPY WITH PROPOFOL;  Surgeon: Rachael Fee, MD;  Location: [facility];  Service: Endoscopy;   Laterality: N/A;   ESOPHAGOGASTRODUODENOSCOPY (EGD) WITH PROPOFOL N/A 01/17/2019   Procedure: ESOPHAGOGASTRODUODENOSCOPY (EGD) WITH PROPOFOL;  Surgeon: Rachael Fee, MD;  Location: [facility];  Service: Endoscopy;  Laterality: N/A;   EUS N/A 01/17/2019   Procedure: UPPER ENDOSCOPIC ULTRASOUND (EUS) RADIAL;  Surgeon: Rachael Fee, MD;  Location: [facility];  Service: Endoscopy;  Laterality: N/A;   POLYPECTOMY  01/17/2019   Procedure: POLYPECTOMY;  Surgeon: Rachael Fee, MD;  Location: [facility];  Service: Endoscopy;;   TONSILLECTOMY     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   Hyperlipidemia 03/27/2015   Family history of breast cancer in female 03/27/2015   Rhinitis, allergic 10/16/2014   Left maxillary sinusitis 10/16/2014   Prediabetes 09/24/2014   Plantar fasciitis 10/22/2013   Abdominal swelling, mass,  or lump felt by patient 03/26/2013   Leg length discrepancy 03/22/2013   Newly diagnosed diabetes (HCC) 03/22/2013   Abdominal pain, chronic, right lower quadrant 03/22/2013   History of basal cell carcinoma excision    Visit for preventive health examination 03/15/2012   Hx of abdominal pain 03/15/2012   Anemia    OBESITY 07/15/2010   Allergic rhinitis 07/15/2010   HYPERGLYCEMIA 07/15/2010   Essential hypertension 06/02/2009   DIABETES MELLITUS, GESTATIONAL, HX OF 06/02/2009   KNEE PAIN 06/03/2008   HYPERLIPIDEMIA 10/09/2007   Esophagitis 10/09/2007   Hematuria 10/09/2007   HIDRADENITIS 10/09/2007    PCP: Berniece Andreas MD   REFERRING PROVIDER: Richardean Sale, DO  REFERRING DIAG: Diagnosis M79.604 (ICD-10-CM) - Right leg pain  M62.89 (ICD-10-CM) - Hamstring tightness of right lower extremity S76.011D (ICD-10-CM) - Muscle strain of right gluteal region, subsequent encounter S76.011A (ICD-10-CM) - Muscle strain of right gluteal region, initial encounter M25.571,G89.29,M25.572 (ICD-10-CM) - Chronic pain of both ankles  THERAPY DIAG:  Pain in right leg  Muscle weakness (generalized)  Other symptoms and signs involving the musculoskeletal system  Rationale for Evaluation and Treatment: Rehabilitation  ONSET DATE: September 2024  SUBJECTIVE:   SUBJECTIVE STATEMENT:  I went to see Dr. [name] for my calf in October last year, it got better with some rest but then I started getting some pain in my left glute. Have been getting PT since November and its not better, the MD suggested dry needling. I can't drive very far at all without severe pain in that area, driving to [place] or [place] can be very painful. I was seeing [name] [facility] but she does not do dry needling. We worked on some strengthening like clam shells and working on mm tightness, I do have a known leg length difference and not sure if that's part of this. Makes no difference as to what kind of surface I'm sitting on. Had a fall down some steps last January, but this doesn't seem related to these symptoms. Did try a shoe lift in the past but it caused a lot of extra pain, it was a real nightmare for me, think it was a podiatrist who did this.   PERTINENT HISTORY: See above  PAIN:  Are you having pain? Yes: NPRS scale: "just sore because I've been driving a lot today"  Pain location: R proximal attachment of HS to mid posterior thigh  Pain description: difficult to describe, very noticeable, like a bruise Aggravating factors: sitting and driving for too long  Relieving factors: pressure   PRECAUTIONS: None  RED FLAGS: None   WEIGHT BEARING RESTRICTIONS: No  FALLS:  Has patient fallen in last 6 months? No  LIVING ENVIRONMENT: Lives  with: lives with their family Lives in: House/apartment Stairs: 2 STE then flight but can stay on main floor    OCCUPATION: works for insurance- sedentary job   PLOF: Independent, Independent with basic ADLs, Independent with gait, and Independent with transfers  PATIENT GOALS: take care of last big of nagging pain that is going on   NEXT MD VISIT: Dr. [name] PRN/after PT   OBJECTIVE:  Note: Objective measures were completed at Evaluation unless otherwise noted.  DIAGNOSTIC FINDINGS:    CLINICAL DATA:  Right lower leg pain.  History of remote fracture.   EXAM: RIGHT TIBIA AND FIBULA - 2 VIEW   COMPARISON:  None Available.   FINDINGS: No evidence of acute fracture. No evidence of healing/healed fracture. The cortical margins are intact.  No erosions, periostitis, or focal bone abnormality. Knee and ankle alignment are maintained. Unremarkable soft tissues.   IMPRESSION: Negative radiographs of the right tibia and fibula.    PATIENT SURVEYS:  FOTO 64  COGNITION: Overall cognitive status: Within functional limits for tasks assessed     SENSATION: Not tested    MUSCLE LENGTH:  Quads WNL Hip flexors WNL  Hamstrings severe limitation B  Piriformis mild limitation R, severe limitation L   L LE significantly shorter than the R, confirmed on xray and did try this in the past without fun   Sciatic flossing negative, SLR negative     PALPATION: R lateral proximal HS mm tight and tender, also noted non tender spasm close to ischial tub  LOWER EXTREMITY ROM:  Lumbar flexion moderate limitation, RFIS no change  Lumbar extension REIS WNL  Lateral lumbar flexion Moderate limitation B   LOWER EXTREMITY MMT:  MMT Right eval Left eval  Hip flexion 5 5  Hip extension 4 4+  Hip abduction 4+ 4+  Hip adduction    Hip internal rotation    Hip external rotation    Knee flexion 3+ 3+  Knee extension 5 5  Ankle dorsiflexion 5 5  Ankle plantarflexion    Ankle  inversion    Ankle eversion     (Blank rows = not tested)                                                                                                                                 TREATMENT DATE:   01/02/24  Exam, care planning, HEP, dry needling done by certified clinician    Trigger Point Dry Needling  Initial Treatment: Pt instructed on Dry Needling rational, procedures, and possible side effects. Pt instructed to expect mild to moderate muscle soreness later in the day and/or into the next day.  Pt instructed in methods to reduce muscle soreness. Pt instructed to continue prescribed HEP. Patient was educated on signs and symptoms of infection and other risk factors and advised to seek medical attention should they occur.  Patient verbalized understanding of these instructions and education.   Patient Verbal Consent Given: Yes Education Handout Provided: Yes Muscles Treated: right proximal hamstring and right piriformis  Electrical Stimulation Performed: No Treatment Response/Outcome: no pain or change noted  Dry needling  performed by Tereasa Coop, PT      PATIENT EDUCATION:  Education details: exam, care planning, dry needling education  Person educated: Patient Education method: Explanation, Demonstration, and Handouts Education comprehension: verbalized understanding, returned demonstration, and needs further education  HOME EXERCISE PROGRAM: Already has established HEP from [facility]   ASSESSMENT:  CLINICAL IMPRESSION: Patient is a 53 y.o. F who was seen today for physical therapy evaluation and treatment for Diagnosis M79.604 (ICD-10-CM) - Right leg pain M62.89 (ICD-10-CM) - Hamstring tightness of right lower extremity S76.011D (ICD-10-CM) - Muscle strain of right gluteal region, subsequent encounter S76.011A (  ICD-10-CM) - Muscle strain of right gluteal region, initial encounter M25.571,G89.29,M25.572 (ICD-10-CM) - Chronic pain of both ankles. She has  been seeing another physical therapist in [facility], they were unable to dry needle at that clinic and her MD asked her to come here for this intervention.   OBJECTIVE IMPAIRMENTS: decreased activity tolerance, decreased strength, increased fascial restrictions, increased muscle spasms, impaired flexibility, obesity, and pain.   ACTIVITY LIMITATIONS: sitting, locomotion level, and caring for others  PARTICIPATION LIMITATIONS: driving, shopping, community activity, and occupation  PERSONAL FACTORS: Age, Fitness, Past/current experiences, Profession, and Time since onset of injury/illness/exacerbation are also affecting patient's functional outcome.   REHAB POTENTIAL: Good  CLINICAL DECISION MAKING: Stable/uncomplicated  EVALUATION COMPLEXITY: Low   GOALS: Goals reviewed with patient? No  SHORT TERM GOALS: Target date: STG = LTGs    LONG TERM GOALS: Target date: 02/13/2024    Will be able to drive for at least 2 hours without increase in pain RLE Baseline:  Goal status: INITIAL  2.  HS and calf tightness to have improved by at least 50% BLEs  Baseline:  Goal status: INITIAL  3.  Muscle spasms in hamstrings to have resolved  Baseline:  Goal status: INITIAL  4.  Will be able to sit at work for at least 3 hours without pain increase R LE  Baseline:  Goal status: INITIAL  5.  FOTO score to be within 5 points of predicted value  Baseline:  Goal status: INITIAL     PLAN:  PT FREQUENCY: 1-2x/week  PT DURATION: 6 weeks  PLANNED INTERVENTIONS: 97164- PT Re-evaluation, 97110-Therapeutic exercises, 97530- Therapeutic activity, 97535- Self Care, 56387- Manual therapy, [id number]- Aquatic Therapy, 97014- Electrical stimulation (unattended), Taping, Dry Needling, Cryotherapy, and Moist heat  PLAN FOR NEXT SESSION: focus on dry needling per MD (has already had extensive PT at [facility] clinic), also continue working on HS flexibility and general strengthening, sciatic mobility PRN.  Monitor ankles, pain is mostly resolved since order was written but we can address this if it comes back   Nedra Hai, PT, DPT 01/02/24 1:56 PM

## 2024-01-02 NOTE — Patient Instructions (Signed)

## 2024-01-09 ENCOUNTER — Ambulatory Visit: Payer: BC Managed Care – PPO | Admitting: Physical Therapy

## 2024-01-12 ENCOUNTER — Ambulatory Visit
Admission: RE | Admit: 2024-01-12 | Discharge: 2024-01-12 | Disposition: A | Discharge status: Home / Self Care | Payer: BC Managed Care – PPO | Source: Ambulatory Visit | Attending: Internal Medicine | Admitting: Internal Medicine

## 2024-01-12 DIAGNOSIS — Z1231 Encounter for screening mammogram for malignant neoplasm of breast: Secondary | ICD-10-CM

## 2024-01-15 NOTE — Progress Notes (Signed)
 Chief Complaint  Patient presents with   Medical Management of Chronic Issues    Pt is here for follow up on BP. Pt states she did skip mounjaro  for colonoscopy in December. Currently is back on it.     HPI: Nicole Gonzalez 53 y.o. come in for Chronic disease management   Dm: mounjaro  and metformin  : off in dec for colon  was up and now better . HT: linsi 10  for control and no se  HLD  simva ? About statins and cholesterol goals  Leg pain  seeing dr   Abelino   not taking   trying to get off advil .  Gi followed   ROS: See pertinent positives and negatives per HPI.  Past Medical History:  Diagnosis Date   Anemia     better after hysterectomy   Cancer (HCC)    basil cell carcinoma/forehead   Diabetes mellitus without complication (HCC)    DIABETES MELLITUS, GESTATIONAL, HX OF 06/02/2009   Qualifier: Diagnosis of  By: Charlett MD, Apolinar POUR    Diverticulosis    Family history of breast cancer    Family history of genetic disease carrier    father has CDKN2A and ATM mutation   Family history of melanoma    Family history of pancreatic cancer    Fracture of lower leg     fall 2011   GERD (gastroesophageal reflux disease)    History of basal cell carcinoma excision    History of gestational diabetes    x2   HTN (hypertension)    Hyperlipidemia    [id number] 043 trx    Increased frequency of headaches 03/15/2012   Internal hemorrhoids    Obesity    Seizures (HCC)    as an infant d/t fever   Tubular adenoma of colon    UNSPECIFIED TACHYCARDIA 09/01/2010   Qualifier: Diagnosis of  By: Domenica MD, Harlene      Family History  Problem Relation Age of Onset   Melanoma Father        x 3 times, first in 58's   Hypertension Father    Hyperlipidemia Father    Squamous cell carcinoma Father    Colon polyps Father    Heart disease Father    Pancreatic cancer Father 67       CDKN2A muation   Arthritis Brother         Psoriatic   Breast cancer Brother 22       ATM + mutation? or VUS    Diabetes Maternal Grandmother    Hyperlipidemia Maternal Grandmother    Heart disease Maternal Grandmother    Skin cancer Maternal Grandfather    Pancreatic cancer Paternal Grandmother 51       met liver   Heart disease Paternal Grandfather    Skin cancer Cousin    Breast cancer Other    Colon cancer Neg Hx    Stomach cancer Neg Hx    Esophageal cancer Neg Hx     Social History   Socioeconomic History   Marital status: Married    Spouse name: Not on file   Number of children: 2   Years of education: Not on file   Highest education level: Not on file  Occupational History   Occupation: Government Social Research Officer: [employer]  Tobacco Use   Smoking status: Never   Smokeless tobacco: Never  Vaping Use   Vaping status: Never Used  Substance and Sexual  Activity   Alcohol use: No   Drug use: No   Sexual activity: Not on file  Other Topics Concern   Not on file  Social History Narrative    Household of 4 -2 children   Son   to college     no pets     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    Nonsmoker married   Social Drivers of Corporate Investment Banker Strain: Not on file  Food Insecurity: No Food Insecurity (07/26/2023)   Hunger Vital Sign    Worried About Running Out of Food in the Last Year: Never true    Ran Out of Food in the Last Year: Never true  Transportation Needs: Not on file  Physical Activity: Sufficiently Active (07/26/2023)   Exercise Vital Sign    Days of Exercise per Week: 7 days    Minutes of Exercise per Session: 30 min  Stress: Stress Concern Present (07/26/2023)   [employer] of Occupational Health - Occupational Stress Questionnaire    Feeling of Stress : To some extent  Social Connections: Socially Integrated (07/26/2023)   Social Connection and Isolation Panel [NHANES]    Frequency of Communication with Friends and Family: More than three times a week    Frequency of Social Gatherings with  Friends and Family: More than three times a week    Attends Religious Services: More than 4 times per year    Active Member of [employer] or Organizations: Yes    Attends Engineer, Structural: More than 4 times per year    Marital Status: Married    Outpatient Medications Prior to Visit  Medication Sig Dispense Refill   glucose blood (CONTOUR NEXT TEST) test strip 100 each by Other route See admin instructions. Use as instructed 100 strip 3   levocetirizine (XYZAL) 5 MG tablet Take 5 mg by mouth every evening.      lisinopril  (ZESTRIL ) 10 MG tablet TAKE 1 TABLET(10 MG) BY MOUTH DAILY FOR HIGH BLOOD PRESSURE 90 tablet 0   metFORMIN  (GLUCOPHAGE -XR) 500 MG 24 hr tablet TAKE 4 TABLETS BY MOUTH DAILY 360 tablet 3   Microlet Lancets MISC USE TO CHECK BLOOD SUGAR ONE TO TWO TIMES DAILY 100 each 3   montelukast (SINGULAIR) 10 MG tablet Take 10 mg by mouth every evening.      Multiple Vitamin (MULTIVITAMIN WITH MINERALS) TABS tablet Take 1 tablet by mouth daily.     omeprazole  (PRILOSEC) 20 MG capsule Take 20 mg by mouth every evening.      QNASL 80 MCG/ACT AERS Place 2 sprays into the nose every evening.      simvastatin  (ZOCOR ) 40 MG tablet TAKE 1 TABLET(40 MG) BY MOUTH DAILY AT 6 PM 90 tablet 1   tirzepatide  (MOUNJARO ) 10 MG/0.5ML Pen Inject 10 mg into the skin once a week. Dosage change 6 mL 2   benzonatate  (TESSALON ) 100 MG capsule Take 1 capsule (100 mg total) by mouth 3 (three) times daily as needed. (Patient not taking: Reported on 01/16/2024) 30 capsule 0   Cyanocobalamin  (VITAMIN B-12) 1000 MCG SUBL Place 1 tablet (1,000 mcg total) under the tongue daily. (Patient not taking: Reported on 01/16/2024) 90 tablet 3   fexofenadine (ALLEGRA) 180 MG tablet Take 180 mg by mouth daily as needed (allergies.). In the morning  (Patient not taking: Reported on 01/16/2024)     ibuprofen  (ADVIL ,MOTRIN ) 200 MG tablet Take 600 mg by mouth  every 8 (eight) hours as needed (pain). (Patient not taking: Reported on  01/16/2024)     LORazepam  (ATIVAN ) 1 MG tablet Take 1 tablet (1 mg total) by mouth as directed. 1 hour prior to your MRI appointment. DO NOT drive while taking this medication (Patient not taking: Reported on 01/16/2024) 1 tablet 0   meloxicam  (MOBIC ) 15 MG tablet Take 1 tablet (15 mg total) by mouth daily. (Patient not taking: Reported on 01/16/2024) 30 tablet 0   Na Sulfate-K Sulfate-Mg Sulf (SUPREP BOWEL PREP KIT) 17.5-3.13-1.6 GM/177ML SOLN Take 1 kit by mouth as directed. 324 mL 0   No facility-administered medications prior to visit.     EXAM:  BP 106/76 (BP Location: Right Arm, Patient Position: Sitting, Cuff Size: Large)   Pulse 85   Temp 97.6 F (36.4 C) (Oral)   Ht 5' 4 (1.626 m)   Wt 189 lb 3.2 oz (85.8 kg)   SpO2 98%   BMI 32.48 kg/m   Body mass index is 32.48 kg/m. Wt Readings from Last 3 Encounters:  01/16/24 189 lb 3.2 oz (85.8 kg)  12/01/23 189 lb (85.7 kg)  11/22/23 191 lb (86.6 kg)    GENERAL: vitals reviewed and listed above, alert, oriented, appears well hydrated and in no acute distress HEENT: atraumatic, conjunctiva  clear, no obvious abnormalities on PSYCH: pleasant and cooperative, no obvious depression or anxiety Lab Results  Component Value Date   WBC 7.4 07/26/2023   HGB 13.6 07/26/2023   HCT 41.5 07/26/2023   PLT 252.0 07/26/2023   GLUCOSE 83 07/26/2023   CHOL 150 07/26/2023   TRIG 73.0 07/26/2023   HDL 51.90 07/26/2023   LDLDIRECT 172.4 10/02/2007   LDLCALC 83 07/26/2023   ALT 23 07/26/2023   AST 20 07/26/2023   NA 136 07/26/2023   K 4.0 07/26/2023   CL 100 07/26/2023   CREATININE 0.59 07/26/2023   BUN 15 07/26/2023   CO2 27 07/26/2023   TSH 1.65 07/26/2023   HGBA1C 5.5 01/16/2024   MICROALBUR <0.7 07/26/2023   BP Readings from Last 3 Encounters:  01/16/24 106/76  12/01/23 124/68  11/22/23 100/68    ASSESSMENT AND PLAN:  Discussed the following assessment and plan:  Type 2 diabetes mellitus with hyperglycemia, without long-term  current use of insulin (HCC) - Plan: POC HgB A1c  Essential hypertension  Medication management Review labs and update  and disc muscle exercises  to avoid strength muscle wasting that can occur   with age and medication.  Disc use of lipid lowering and goals depending risks  benefit and at this time benefit more than risk  -Patient advised to return or notify health care team  if  new concerns arise. Future lab order pre cpe ion August placed  Patient Instructions  Good to see you today  Make sure doing resistance  exercises.   August cpx  and lab previsit .         Nilza Eaker K. Prentiss Polio M.D.

## 2024-01-16 ENCOUNTER — Other Ambulatory Visit: Payer: Self-pay | Admitting: Internal Medicine

## 2024-01-16 ENCOUNTER — Ambulatory Visit: Payer: BC Managed Care – PPO | Admitting: Internal Medicine

## 2024-01-16 ENCOUNTER — Encounter: Payer: Self-pay | Admitting: Internal Medicine

## 2024-01-16 VITALS — BP 106/76 | HR 85 | Temp 97.6°F | Ht 64.0 in | Wt 189.2 lb

## 2024-01-16 DIAGNOSIS — Z0000 Encounter for general adult medical examination without abnormal findings: Secondary | ICD-10-CM

## 2024-01-16 DIAGNOSIS — Z7984 Long term (current) use of oral hypoglycemic drugs: Secondary | ICD-10-CM

## 2024-01-16 DIAGNOSIS — E785 Hyperlipidemia, unspecified: Secondary | ICD-10-CM

## 2024-01-16 DIAGNOSIS — Z79899 Other long term (current) drug therapy: Secondary | ICD-10-CM

## 2024-01-16 DIAGNOSIS — I10 Essential (primary) hypertension: Secondary | ICD-10-CM

## 2024-01-16 DIAGNOSIS — E1165 Type 2 diabetes mellitus with hyperglycemia: Secondary | ICD-10-CM | POA: Diagnosis not present

## 2024-01-16 DIAGNOSIS — Z7985 Long-term (current) use of injectable non-insulin antidiabetic drugs: Secondary | ICD-10-CM

## 2024-01-16 DIAGNOSIS — Z1509 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: Secondary | ICD-10-CM

## 2024-01-16 LAB — POCT GLYCOSYLATED HEMOGLOBIN (HGB A1C): Hemoglobin A1C: 5.5 % (ref 4.0–5.6)

## 2024-01-16 NOTE — Patient Instructions (Addendum)
Good to see you today  Make sure doing resistance  exercises.   August cpx  and lab previsit .

## 2024-01-16 NOTE — Progress Notes (Signed)
Future cpe and disease monitoring labs  if patient wishes  lab appt pre visit

## 2024-01-17 ENCOUNTER — Encounter: Payer: Self-pay | Admitting: Physical Therapy

## 2024-01-17 ENCOUNTER — Ambulatory Visit: Payer: BC Managed Care – PPO | Admitting: Physical Therapy

## 2024-01-17 DIAGNOSIS — M6281 Muscle weakness (generalized): Secondary | ICD-10-CM

## 2024-01-17 DIAGNOSIS — R29898 Other symptoms and signs involving the musculoskeletal system: Secondary | ICD-10-CM | POA: Diagnosis not present

## 2024-01-17 DIAGNOSIS — M79604 Pain in right leg: Secondary | ICD-10-CM

## 2024-01-17 NOTE — Therapy (Signed)
 OUTPATIENT PHYSICAL THERAPY LOWER EXTREMITY TREATMENT   Patient Name: Nicole Gonzalez MRN: [id number] DOB:01/10/1971, 53 y.o., female Today's Date: 01/17/2024  END OF SESSION:  PT End of Session - 01/17/24 1303     Visit Number 2    Number of Visits 13    Date for PT Re-Evaluation 02/13/24    Authorization Type BCBS Comm PPO    Authorization Time Period 01/02/24 to 02/13/24    PT Start Time 1304    PT Stop Time 1349    PT Time Calculation (min) 45 min    Activity Tolerance Patient tolerated treatment well    Behavior During Therapy WFL for tasks assessed/performed             Past Medical History:  Diagnosis Date   Anemia     better after hysterectomy   Cancer (HCC)    basil cell carcinoma/forehead   Diabetes mellitus without complication (HCC)    DIABETES MELLITUS, GESTATIONAL, HX OF 06/02/2009   Qualifier: Diagnosis of  By: Charlett MD, Apolinar POUR    Diverticulosis    Family history of breast cancer    Family history of genetic disease carrier    father has CDKN2A and ATM mutation   Family history of melanoma    Family history of pancreatic cancer    Fracture of lower leg     fall 2011   GERD (gastroesophageal reflux disease)    History of basal cell carcinoma excision    History of gestational diabetes    x2   HTN (hypertension)    Hyperlipidemia    [id number] 043 trx    Increased frequency of headaches 03/15/2012   Internal hemorrhoids    Obesity    Seizures (HCC)    as an infant d/t fever   Tubular adenoma of colon    UNSPECIFIED TACHYCARDIA 09/01/2010   Qualifier: Diagnosis of  By: Domenica MD, Harlene     Past Surgical History:  Procedure Laterality Date   ABDOMINAL HYSTERECTOMY  06/29/2010    question had abnormal cells on Path, fibroids   BASAL CELL CARCINOMA EXCISION     BREAST BIOPSY Right 2009   COLONOSCOPY WITH PROPOFOL  N/A 01/17/2019   Procedure: COLONOSCOPY WITH PROPOFOL ;  Surgeon: Teressa Toribio SQUIBB, MD;  Location: [facility];  Service: Endoscopy;   Laterality: N/A;   ESOPHAGOGASTRODUODENOSCOPY (EGD) WITH PROPOFOL  N/A 01/17/2019   Procedure: ESOPHAGOGASTRODUODENOSCOPY (EGD) WITH PROPOFOL ;  Surgeon: Teressa Toribio SQUIBB, MD;  Location: [facility];  Service: Endoscopy;  Laterality: N/A;   EUS N/A 01/17/2019   Procedure: UPPER ENDOSCOPIC ULTRASOUND (EUS) RADIAL;  Surgeon: Teressa Toribio SQUIBB, MD;  Location: [facility];  Service: Endoscopy;  Laterality: N/A;   POLYPECTOMY  01/17/2019   Procedure: POLYPECTOMY;  Surgeon: Teressa Toribio SQUIBB, MD;  Location: [facility];  Service: Endoscopy;;   TONSILLECTOMY     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   Hyperlipidemia 03/27/2015   Family history of breast cancer in female 03/27/2015   Rhinitis, allergic 10/16/2014   Left maxillary sinusitis 10/16/2014   Prediabetes 09/24/2014   Plantar fasciitis 10/22/2013   Abdominal swelling, mass,  or lump felt by patient 03/26/2013   Leg length discrepancy 03/22/2013   Newly diagnosed diabetes (HCC) 03/22/2013   Abdominal pain, chronic, right lower quadrant 03/22/2013   History of basal cell carcinoma excision    Visit for preventive health examination 03/15/2012   Hx of abdominal pain 03/15/2012   Anemia    OBESITY 07/15/2010   Allergic rhinitis 07/15/2010   HYPERGLYCEMIA 07/15/2010   Essential hypertension 06/02/2009   DIABETES MELLITUS, GESTATIONAL, HX OF 06/02/2009   KNEE PAIN 06/03/2008   HYPERLIPIDEMIA 10/09/2007   Esophagitis 10/09/2007   Hematuria 10/09/2007   HIDRADENITIS 10/09/2007    PCP: Charlett Howard MD   REFERRING PROVIDER: Leonce Katz, DO  REFERRING DIAG: Diagnosis M79.604 (ICD-10-CM) - Right leg pain  M62.89 (ICD-10-CM) - Hamstring tightness of right lower extremity S76.011D (ICD-10-CM) - Muscle strain of right gluteal region, subsequent encounter S76.011A (ICD-10-CM) - Muscle strain of right gluteal region, initial encounter M25.571,G89.29,M25.572 (ICD-10-CM) - Chronic pain of both ankles  THERAPY DIAG:  Pain in right leg  Muscle weakness (generalized)  Other symptoms and signs involving the musculoskeletal system  Rationale for Evaluation and Treatment: Rehabilitation  ONSET DATE: September 2024  SUBJECTIVE:   SUBJECTIVE STATEMENT: 01/17/2024 States that did have a drive following last session and since then she has had increased pain but it is more along the side of her hip and not in the back. States she did not have any spasms but it didn't improving. Brought in her exercises from her previous PT.  Eval: I went to see Dr. Leonce for my calf in October last year, it got better with some rest but then I started getting some pain in my left glute. Have been getting PT since November and its not better, the MD suggested dry needling. I can't drive very far at all without severe pain in that area, driving to [place] or [place] can be very painful. I was seeing Almarie over at [facility] but she does not do dry needling. We worked on some strengthening like clam shells and working on mm tightness, I do have a known leg length difference and not sure if that's part of this. Makes no difference as to what kind of surface I'm sitting on. Had a fall down some steps last January, but this doesn't seem related to these symptoms. Did try a shoe lift in the past but it caused a lot of extra pain, it was a real nightmare for me, think it was a podiatrist who did this.   PERTINENT HISTORY: See above  PAIN:  Are you having pain? Yes: NPRS scale: 5/10 Pain location: R lateral hip   Pain description: achy, shore Aggravating factors: sitting and driving for too long  Relieving factors: pressure    PRECAUTIONS: None  RED FLAGS: None   WEIGHT BEARING RESTRICTIONS: No  FALLS:  Has patient fallen in last 6 months? No  LIVING ENVIRONMENT: Lives with: lives with their family Lives in: House/apartment Stairs: 2 STE then flight but can stay on main floor    OCCUPATION: works for insurance- sedentary job   PLOF: Independent, Independent with basic ADLs, Independent with gait, and Independent with transfers  PATIENT GOALS: take care of last big of nagging pain that is going on   NEXT MD VISIT: Dr. Leonce PRN/after PT   OBJECTIVE:  Note: Objective measures were completed at Evaluation unless otherwise noted.  DIAGNOSTIC FINDINGS:    CLINICAL DATA:  Right lower leg pain.  History of remote fracture.  EXAM: RIGHT TIBIA AND FIBULA - 2 VIEW   COMPARISON:  None Available.   FINDINGS: No evidence of acute fracture. No evidence of healing/healed fracture. The cortical margins are intact. No erosions, periostitis, or focal bone abnormality. Knee and ankle alignment are maintained. Unremarkable soft tissues.   IMPRESSION: Negative radiographs of the right tibia and fibula.    PATIENT SURVEYS:  FOTO 64  COGNITION: Overall cognitive status: Within functional limits for tasks assessed     SENSATION: Not tested    MUSCLE LENGTH:  Quads WNL Hip flexors WNL  Hamstrings severe limitation B  Piriformis mild limitation R, severe limitation L   L LE significantly shorter than the R, confirmed on xray and did try this in the past without fun   Sciatic flossing negative, SLR negative     PALPATION: R lateral proximal HS mm tight and tender, also noted non tender spasm close to ischial tub  LOWER EXTREMITY ROM:  Lumbar flexion moderate limitation, RFIS no change  Lumbar extension REIS WNL  Lateral lumbar flexion Moderate limitation B   LOWER EXTREMITY MMT:  MMT Right eval Left eval  Hip flexion 5 5  Hip extension 4 4+  Hip abduction 4+ 4+  Hip  adduction    Hip internal rotation    Hip external rotation    Knee flexion 3+ 3+  Knee extension 5 5  Ankle dorsiflexion 5 5  Ankle plantarflexion    Ankle inversion    Ankle eversion     (Blank rows = not tested)                                                                                                                                 TREATMENT DATE:  01/17/2024 Therapeutic Exercise:  Review of all exercisers from previous PT 15 minutes: Supine: bridges - feels good during x10 knees in more flexion Prone:  Seated:  Standing: Neuromuscular Re-education: pelvic tilts in supine and quadruped - prior demo and verbal/tactile cues, on sitting on sit bones 10 minutes Manual Therapy:PA to right hip grade II/III- felt good, STM to right hamstrings and glutes - felt good Therapeutic Activity: Self Care: Trigger Point Dry Needling:  Modalities:    Trigger Point Dry Needling (not billed for)  Initial Treatment: Pt instructed on Dry Needling rational, procedures, and possible side effects. Pt instructed to expect mild to moderate muscle soreness later in the day and/or into the next day.  Pt instructed in methods to reduce muscle soreness. Pt instructed to continue prescribed HEP. Patient was educated on signs and symptoms of infection and other risk factors and advised to seek medical attention should they occur.  Patient verbalized understanding of these instructions and education.   Patient Verbal Consent Given: Yes Education Handout Provided: Yes Muscles Treated: right proximal hamstring x2 Electrical Stimulation Performed: No Treatment Response/Outcome: no pain or change noted        PATIENT EDUCATION:  Education  details: exam, care planning, dry needling education, on POC and posture  Person educated: Patient Education method: Explanation, Demonstration, and Handouts Education comprehension: verbalized understanding, returned demonstration, and needs further  education  HOME EXERCISE PROGRAM: Already has established HEP from [facility] PT   ASSESSMENT:  CLINICAL IMPRESSION: 01/17/2024 Session focused on review of previous home exercise program from previous physical therapy.  Discontinued painful exercises at this time and added pelvic tilts to home exercise program.  Very difficult for patient to perform anterior pelvic tilt.  Practiced in quadruped and in supine with prior demonstration and verbal and tactile cues.  Continued with manual therapy and PA of right hip felt very good.  Perform dry needling to right proximal hamstring with reduced pain noted afterwards.  Overall patient performing well and will continue to benefit from skilled PT at this time.  EVAL: Patient is a 53 y.o. F who was seen today for physical therapy evaluation and treatment for Diagnosis M79.604 (ICD-10-CM) - Right leg pain M62.89 (ICD-10-CM) - Hamstring tightness of right lower extremity S76.011D (ICD-10-CM) - Muscle strain of right gluteal region, subsequent encounter S76.011A (ICD-10-CM) - Muscle strain of right gluteal region, initial encounter M25.571,G89.29,M25.572 (ICD-10-CM) - Chronic pain of both ankles. She has been seeing another physical therapist in [facility], they were unable to dry needle at that clinic and her MD asked her to come here for this intervention.   OBJECTIVE IMPAIRMENTS: decreased activity tolerance, decreased strength, increased fascial restrictions, increased muscle spasms, impaired flexibility, obesity, and pain.   ACTIVITY LIMITATIONS: sitting, locomotion level, and caring for others  PARTICIPATION LIMITATIONS: driving, shopping, community activity, and occupation  PERSONAL FACTORS: Age, Fitness, Past/current experiences, Profession, and Time since onset of injury/illness/exacerbation are also affecting patient's functional outcome.   REHAB POTENTIAL: Good  CLINICAL DECISION MAKING: Stable/uncomplicated  EVALUATION COMPLEXITY:  Low   GOALS: Goals reviewed with patient? No  SHORT TERM GOALS: Target date: STG = LTGs    LONG TERM GOALS: Target date: 02/13/2024    Will be able to drive for at least 2 hours without increase in pain RLE Baseline:  Goal status: INITIAL  2.  HS and calf tightness to have improved by at least 50% BLEs  Baseline:  Goal status: INITIAL  3.  Muscle spasms in hamstrings to have resolved  Baseline:  Goal status: INITIAL  4.  Will be able to sit at work for at least 3 hours without pain increase R LE  Baseline:  Goal status: INITIAL  5.  FOTO score to be within 5 points of predicted value  Baseline:  Goal status: INITIAL     PLAN:  PT FREQUENCY: 1-2x/week  PT DURATION: 6 weeks  PLANNED INTERVENTIONS: 97164- PT Re-evaluation, 97110-Therapeutic exercises, 97530- Therapeutic activity, 97535- Self Care, [id number]- Manual therapy, [id number]- Aquatic Therapy, 97014- Electrical stimulation (unattended), Taping, Dry Needling, Cryotherapy, and Moist heat  PLAN FOR NEXT SESSION:focus on pelvic tilts and progress to stretches/eccentric work once able. Continue with PA to right hip, DN and STM   focus on dry needling per MD (has already had extensive PT at [facility] clinic), also continue working on HS flexibility and general strengthening, sciatic mobility PRN. Monitor ankles, pain is mostly resolved since order was written but we can address this if it comes back   1:54 PM, 01/17/24 Olivia Church, DPT Physical Therapy with [facility]

## 2024-01-23 ENCOUNTER — Encounter: Payer: Self-pay | Admitting: Physical Therapy

## 2024-01-23 ENCOUNTER — Ambulatory Visit: Payer: BC Managed Care – PPO | Admitting: Physical Therapy

## 2024-01-23 DIAGNOSIS — M79604 Pain in right leg: Secondary | ICD-10-CM

## 2024-01-23 DIAGNOSIS — R29898 Other symptoms and signs involving the musculoskeletal system: Secondary | ICD-10-CM

## 2024-01-23 DIAGNOSIS — M6281 Muscle weakness (generalized): Secondary | ICD-10-CM | POA: Diagnosis not present

## 2024-01-23 NOTE — Therapy (Signed)
OUTPATIENT PHYSICAL THERAPY LOWER EXTREMITY TREATMENT   Patient Name: Nicole Gonzalez MRN: [id number] DOB:Oct 03, 1971, 53 y.o., female Today's Date: 01/23/2024  END OF SESSION:  PT End of Session - 01/23/24 0848     Visit Number 3    Number of Visits 13    Date for PT Re-Evaluation 02/13/24    Authorization Type [employer] PPO    Authorization Time Period 01/02/24 to 02/13/24    PT Start Time 0849    PT Stop Time 0928    PT Time Calculation (min) 39 min    Activity Tolerance Patient tolerated treatment well    Behavior During Therapy WFL for tasks assessed/performed             Past Medical History:  Diagnosis Date   Anemia     better after hysterectomy   Cancer (HCC)    basil cell carcinoma/forehead   Diabetes mellitus without complication (HCC)    DIABETES MELLITUS, GESTATIONAL, HX OF 06/02/2009   Qualifier: Diagnosis of  By: Fabian Sharp MD, Neta Mends    Diverticulosis    Family history of breast cancer    Family history of genetic disease carrier    father has CDKN2A and ATM mutation   Family history of melanoma    Family history of pancreatic cancer    Fracture of lower leg     fall 2011   GERD (gastroesophageal reflux disease)    History of basal cell carcinoma excision    History of gestational diabetes    x2   HTN (hypertension)    Hyperlipidemia    [id number] 043 trx    Increased frequency of headaches 03/15/2012   Internal hemorrhoids    Obesity    Seizures (HCC)    as an infant d/t fever   Tubular adenoma of colon    UNSPECIFIED TACHYCARDIA 09/01/2010   Qualifier: Diagnosis of  By: Abner Greenspan MD, Misty Stanley     Past Surgical History:  Procedure Laterality Date   ABDOMINAL HYSTERECTOMY  06/29/2010    question had abnormal cells on Path, fibroids   BASAL CELL CARCINOMA EXCISION     BREAST BIOPSY Right 2009   COLONOSCOPY WITH PROPOFOL N/A 01/17/2019   Procedure: COLONOSCOPY WITH PROPOFOL;  Surgeon: Rachael Fee, MD;  Location: [facility];  Service: Endoscopy;   Laterality: N/A;   ESOPHAGOGASTRODUODENOSCOPY (EGD) WITH PROPOFOL N/A 01/17/2019   Procedure: ESOPHAGOGASTRODUODENOSCOPY (EGD) WITH PROPOFOL;  Surgeon: Rachael Fee, MD;  Location: [facility];  Service: Endoscopy;  Laterality: N/A;   EUS N/A 01/17/2019   Procedure: UPPER ENDOSCOPIC ULTRASOUND (EUS) RADIAL;  Surgeon: Rachael Fee, MD;  Location: [facility];  Service: Endoscopy;  Laterality: N/A;   POLYPECTOMY  01/17/2019   Procedure: POLYPECTOMY;  Surgeon: Rachael Fee, MD;  Location: [facility];  Service: Endoscopy;;   TONSILLECTOMY     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   Hyperlipidemia 03/27/2015   Family history of breast cancer in female 03/27/2015   Rhinitis, allergic 10/16/2014   Left maxillary sinusitis 10/16/2014   Prediabetes 09/24/2014   Plantar fasciitis 10/22/2013   Abdominal swelling, mass,  or lump felt by patient 03/26/2013   Leg length discrepancy 03/22/2013   Newly diagnosed diabetes (HCC) 03/22/2013   Abdominal pain, chronic, right lower quadrant 03/22/2013   History of basal cell carcinoma excision    Visit for preventive health examination 03/15/2012   Hx of abdominal pain 03/15/2012   Anemia    OBESITY 07/15/2010   Allergic rhinitis 07/15/2010   HYPERGLYCEMIA 07/15/2010   Essential hypertension 06/02/2009   DIABETES MELLITUS, GESTATIONAL, HX OF 06/02/2009   KNEE PAIN 06/03/2008   HYPERLIPIDEMIA 10/09/2007   Esophagitis 10/09/2007   Hematuria 10/09/2007   HIDRADENITIS 10/09/2007    PCP: Berniece Andreas MD   REFERRING PROVIDER: Richardean Sale, DO  REFERRING DIAG: Diagnosis M79.604 (ICD-10-CM) - Right leg pain  M62.89 (ICD-10-CM) - Hamstring tightness of right lower extremity S76.011D (ICD-10-CM) - Muscle strain of right gluteal region, subsequent encounter S76.011A (ICD-10-CM) - Muscle strain of right gluteal region, initial encounter M25.571,G89.29,M25.572 (ICD-10-CM) - Chronic pain of both ankles  THERAPY DIAG:  Pain in right leg  Muscle weakness (generalized)  Other symptoms and signs involving the musculoskeletal system  Rationale for Evaluation and Treatment: Rehabilitation  ONSET DATE: September 2024  SUBJECTIVE:   SUBJECTIVE STATEMENT: 01/23/2024 States that she is a little sore. States she has been busy and not been doing her to do her exercise. States she has been trying to sit better.   Eval: I went to see Dr. [name] for my calf in October last year, it got better with some rest but then I started getting some pain in my left glute. Have been getting PT since November and its not better, the MD suggested dry needling. I can't drive very far at all without severe pain in that area, driving to [place] or [place] can be very painful. I was seeing [name] over at [facility] [place] but she does not do dry needling. We worked on some strengthening like clam shells and working on mm tightness, I do have a known leg length difference and not sure if that's part of this. Makes no difference as to what kind of surface I'm sitting on. Had a fall down some steps last January, but this doesn't seem related to these symptoms. Did try a shoe lift in the past but it caused a lot of extra pain, it was a real nightmare for me, think it was a podiatrist who did this.   PERTINENT HISTORY: See above  PAIN:  Are you having pain? Yes: NPRS scale: 5/10 Pain location: R hamstring   Pain description: achy, shore Aggravating factors: sitting and driving for too long  Relieving factors: pressure   PRECAUTIONS: None  RED FLAGS: None   WEIGHT BEARING RESTRICTIONS: No  FALLS:  Has patient fallen in last 6  months? No  LIVING ENVIRONMENT: Lives with: lives with their family Lives in: House/apartment Stairs: 2 STE then flight but can stay on main floor    OCCUPATION: works for insurance- sedentary job   PLOF: Independent, Independent with basic ADLs, Independent with gait, and Independent with transfers  PATIENT GOALS: take care of last big of nagging pain that is going on   NEXT MD VISIT: Dr. [name] PRN/after PT   OBJECTIVE:  Note: Objective measures were completed at Evaluation unless otherwise noted.  DIAGNOSTIC FINDINGS:    CLINICAL DATA:  Right lower leg pain.  History of remote fracture.   EXAM: RIGHT TIBIA AND FIBULA - 2 VIEW   COMPARISON:  None Available.   FINDINGS: No evidence of acute  fracture. No evidence of healing/healed fracture. The cortical margins are intact. No erosions, periostitis, or focal bone abnormality. Knee and ankle alignment are maintained. Unremarkable soft tissues.   IMPRESSION: Negative radiographs of the right tibia and fibula.    PATIENT SURVEYS:  FOTO 64  COGNITION: Overall cognitive status: Within functional limits for tasks assessed     SENSATION: Not tested    MUSCLE LENGTH:  Quads WNL Hip flexors WNL  Hamstrings severe limitation B  Piriformis mild limitation R, severe limitation L   L LE significantly shorter than the R, confirmed on xray and did try this in the past without fun   Sciatic flossing negative, SLR negative     PALPATION: R lateral proximal HS mm tight and tender, also noted non tender spasm close to ischial tub  LOWER EXTREMITY ROM:  Lumbar flexion moderate limitation, RFIS no change  Lumbar extension REIS WNL  Lateral lumbar flexion Moderate limitation B   LOWER EXTREMITY MMT:  MMT Right eval Left eval  Hip flexion 5 5  Hip extension 4 4+  Hip abduction 4+ 4+  Hip adduction    Hip internal rotation    Hip external rotation    Knee flexion 3+ 3+  Knee extension 5 5  Ankle  dorsiflexion 5 5  Ankle plantarflexion    Ankle inversion    Ankle eversion     (Blank rows = not tested)                                                                                                                                 TREATMENT DATE:  01/23/2024 Therapeutic Exercise:    Supine:   Prone:hamstring curls x15 5" holds R  Seated:  Standing: Neuromuscular Re-education: pelvic tilts in supine with hamstring activation 12 minutes - tolerated well  Manual Therapy:PA to right hip grade II/III- felt good, STM to right hamstrings and glutes - with kne bent and straight, IASTM to right glutes and hamstring - tolerated well  Therapeutic Activity: Self Care: Trigger Point Dry Needling:  Modalities:    Trigger Point Dry Needling (not billed for)  Initial Treatment: Pt instructed on Dry Needling rational, procedures, and possible side effects. Pt instructed to expect mild to moderate muscle soreness later in the day and/or into the next day.  Pt instructed in methods to reduce muscle soreness. Pt instructed to continue prescribed HEP. Patient was educated on signs and symptoms of infection and other risk factors and advised to seek medical attention should they occur.  Patient verbalized understanding of these instructions and education.   Patient Verbal Consent Given: Yes Education Handout Provided: Yes Muscles Treated: right proximal hamstring x4 Electrical Stimulation Performed: No Treatment Response/Outcome: no pain or change noted        PATIENT EDUCATION:  Education details: exam, care planning, dry needling education, on POC and posture  Person educated: Patient Education method: Explanation, Demonstration, and Handouts Education comprehension: verbalized  understanding, returned demonstration, and needs further education  HOME EXERCISE PROGRAM: Already has established HEP from [facility] PT   ASSESSMENT:  CLINICAL IMPRESSION: 01/23/2024 Session focused on  review of exercises and continued manual therapy. Started with soft tissue mobilization as muscles very tight with increased resting tone at start of session. Added DN and tolerated well. Performed manual work while needles left in situ. Reduced pain noted end of session. Overall patient doing well. Continued to review pelvic mobility and control. No new exercises to HEP due to patient's schedule at this time.   EVAL: Patient is a 53 y.o. F who was seen today for physical therapy evaluation and treatment for Diagnosis M79.604 (ICD-10-CM) - Right leg pain M62.89 (ICD-10-CM) - Hamstring tightness of right lower extremity S76.011D (ICD-10-CM) - Muscle strain of right gluteal region, subsequent encounter S76.011A (ICD-10-CM) - Muscle strain of right gluteal region, initial encounter M25.571,G89.29,M25.572 (ICD-10-CM) - Chronic pain of both ankles. She has been seeing another physical therapist in [place], they were unable to dry needle at that clinic and her MD asked her to come here for this intervention.   OBJECTIVE IMPAIRMENTS: decreased activity tolerance, decreased strength, increased fascial restrictions, increased muscle spasms, impaired flexibility, obesity, and pain.   ACTIVITY LIMITATIONS: sitting, locomotion level, and caring for others  PARTICIPATION LIMITATIONS: driving, shopping, community activity, and occupation  PERSONAL FACTORS: Age, Fitness, Past/current experiences, Profession, and Time since onset of injury/illness/exacerbation are also affecting patient's functional outcome.   REHAB POTENTIAL: Good  CLINICAL DECISION MAKING: Stable/uncomplicated  EVALUATION COMPLEXITY: Low   GOALS: Goals reviewed with patient? No  SHORT TERM GOALS: Target date: STG = LTGs    LONG TERM GOALS: Target date: 02/13/2024    Will be able to drive for at least 2 hours without increase in pain RLE Baseline:  Goal status: INITIAL  2.  HS and calf tightness to have improved by at least 50% BLEs   Baseline:  Goal status: INITIAL  3.  Muscle spasms in hamstrings to have resolved  Baseline:  Goal status: INITIAL  4.  Will be able to sit at work for at least 3 hours without pain increase R LE  Baseline:  Goal status: INITIAL  5.  FOTO score to be within 5 points of predicted value  Baseline:  Goal status: INITIAL     PLAN:  PT FREQUENCY: 1-2x/week  PT DURATION: 6 weeks  PLANNED INTERVENTIONS: 97164- PT Re-evaluation, 97110-Therapeutic exercises, 97530- Therapeutic activity, 97535- Self Care, 82956- Manual therapy, [id number]- Aquatic Therapy, 97014- Electrical stimulation (unattended), Taping, Dry Needling, Cryotherapy, and Moist heat  PLAN FOR NEXT SESSION:focus on pelvic tilts and progress to stretches/eccentric work once able. Continue with PA to right hip, DN and STM   focus on dry needling per MD (has already had extensive PT at [facility]), also continue working on HS flexibility and general strengthening, sciatic mobility PRN. Monitor ankles, pain is mostly resolved since order was written but we can address this if it comes back   9:30 AM, 01/23/24 Tereasa Coop, DPT Physical Therapy with [facility]

## 2024-01-30 ENCOUNTER — Encounter: Payer: BC Managed Care – PPO | Admitting: Physical Therapy

## 2024-01-31 ENCOUNTER — Encounter: Payer: BC Managed Care – PPO | Admitting: Physical Therapy

## 2024-02-01 ENCOUNTER — Telehealth: Payer: Self-pay | Admitting: Sports Medicine

## 2024-02-01 NOTE — Telephone Encounter (Signed)
Left message letting patient know °

## 2024-02-01 NOTE — Telephone Encounter (Signed)
Patient called to follow up on FMLA paperwork. She said that we should have received it around 2/6. I did not see anything scanned into media yet.  Do you have a copy of it?

## 2024-02-06 ENCOUNTER — Encounter: Payer: BC Managed Care – PPO | Admitting: Physical Therapy

## 2024-02-07 ENCOUNTER — Ambulatory Visit: Payer: BC Managed Care – PPO | Admitting: Physical Therapy

## 2024-02-07 ENCOUNTER — Encounter: Payer: Self-pay | Admitting: Physical Therapy

## 2024-02-07 DIAGNOSIS — M6281 Muscle weakness (generalized): Secondary | ICD-10-CM

## 2024-02-07 DIAGNOSIS — M79604 Pain in right leg: Secondary | ICD-10-CM | POA: Diagnosis not present

## 2024-02-07 DIAGNOSIS — R29898 Other symptoms and signs involving the musculoskeletal system: Secondary | ICD-10-CM

## 2024-02-07 NOTE — Therapy (Signed)
 OUTPATIENT PHYSICAL THERAPY LOWER EXTREMITY TREATMENT   Patient Name: Nicole Gonzalez MRN: [id number] DOB:[birth year], 53 y.o., female Today's Date: 02/07/2024  END OF SESSION:  PT End of Session - 02/07/24 1301     Visit Number 4    Number of Visits 13    Date for PT Re-Evaluation 02/13/24    Authorization Type [employer] PPO    Authorization Time Period 01/02/24 to 02/13/24    PT Start Time 1302    PT Stop Time 1344    PT Time Calculation (min) 42 min    Activity Tolerance Patient tolerated treatment well    Behavior During Therapy WFL for tasks assessed/performed             Past Medical History:  Diagnosis Date   Anemia     better after hysterectomy   Cancer (HCC)    basil cell carcinoma/forehead   Diabetes mellitus without complication (HCC)    DIABETES MELLITUS, GESTATIONAL, HX OF 06/02/2009   Qualifier: Diagnosis of  By: Fabian Sharp MD, Neta Mends    Diverticulosis    Family history of breast cancer    Family history of genetic disease carrier    father has CDKN2A and ATM mutation   Family history of melanoma    Family history of pancreatic cancer    Fracture of lower leg     fall 2011   GERD (gastroesophageal reflux disease)    History of basal cell carcinoma excision    History of gestational diabetes    x2   HTN (hypertension)    Hyperlipidemia    [id number] trx    Increased frequency of headaches 03/15/2012   Internal hemorrhoids    Obesity    Seizures (HCC)    as an infant d/t fever   Tubular adenoma of colon    UNSPECIFIED TACHYCARDIA 09/01/2010   Qualifier: Diagnosis of  By: Abner Greenspan MD, Misty Stanley     Past Surgical History:  Procedure Laterality Date   ABDOMINAL HYSTERECTOMY  06/29/2010    question had abnormal cells on Path, fibroids   BASAL CELL CARCINOMA EXCISION     BREAST BIOPSY Right 2009   COLONOSCOPY WITH PROPOFOL N/A 01/17/2019   Procedure: COLONOSCOPY WITH PROPOFOL;  Surgeon: Rachael Fee, MD;  Location: [facility];  Service: Endoscopy;   Laterality: N/A;   ESOPHAGOGASTRODUODENOSCOPY (EGD) WITH PROPOFOL N/A 01/17/2019   Procedure: ESOPHAGOGASTRODUODENOSCOPY (EGD) WITH PROPOFOL;  Surgeon: Rachael Fee, MD;  Location: [facility];  Service: Endoscopy;  Laterality: N/A;   EUS N/A 01/17/2019   Procedure: UPPER ENDOSCOPIC ULTRASOUND (EUS) RADIAL;  Surgeon: Rachael Fee, MD;  Location: [facility];  Service: Endoscopy;  Laterality: N/A;   POLYPECTOMY  01/17/2019   Procedure: POLYPECTOMY;  Surgeon: Rachael Fee, MD;  Location: [facility];  Service: Endoscopy;;   TONSILLECTOMY     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   Hyperlipidemia 03/27/2015   Family history of breast cancer in female 03/27/2015   Rhinitis, allergic 10/16/2014   Left maxillary sinusitis 10/16/2014   Prediabetes 09/24/2014   Plantar fasciitis 10/22/2013   Abdominal swelling, mass,  or lump felt by patient 03/26/2013   Leg length discrepancy 03/22/2013   Newly diagnosed diabetes (HCC) 03/22/2013   Abdominal pain, chronic, right lower quadrant 03/22/2013   History of basal cell carcinoma excision    Visit for preventive health examination 03/15/2012   Hx of abdominal pain 03/15/2012   Anemia    OBESITY 07/15/2010   Allergic rhinitis 07/15/2010   HYPERGLYCEMIA 07/15/2010   Essential hypertension 06/02/2009   DIABETES MELLITUS, GESTATIONAL, HX OF 06/02/2009   KNEE PAIN 06/03/2008   HYPERLIPIDEMIA 10/09/2007   Esophagitis 10/09/2007   Hematuria 10/09/2007   HIDRADENITIS 10/09/2007    PCP: Berniece Andreas MD   REFERRING PROVIDER: Richardean Sale, DO  REFERRING DIAG: Diagnosis M79.604 (ICD-10-CM) - Right leg pain  M62.89 (ICD-10-CM) - Hamstring tightness of right lower extremity S76.011D (ICD-10-CM) - Muscle strain of right gluteal region, subsequent encounter S76.011A (ICD-10-CM) - Muscle strain of right gluteal region, initial encounter M25.571,G89.29,M25.572 (ICD-10-CM) - Chronic pain of both ankles  THERAPY DIAG:  Pain in right leg  Muscle weakness (generalized)  Other symptoms and signs involving the musculoskeletal system  Rationale for Evaluation and Treatment: Rehabilitation  ONSET DATE: September 2024  SUBJECTIVE:   SUBJECTIVE STATEMENT: 02/07/2024 States she has had increased pain at her sit bone and she has radiating pain. States she notices in the morning which is typically a good time. States it has been worse the last 5 days. States she is unsure if she is doing her exercises correctly. Reports she was doing well with the drive after last session.   Eval: I went to see Dr. [name] for my calf in October last year, it got better with some rest but then I started getting some pain in my left glute. Have been getting PT since November and its not better, the MD suggested dry needling. I can't drive very far at all without severe pain in that area, driving to [place] or [place] can be very painful. I was seeing [name] [facility] but she does not do dry needling. We worked on some strengthening like clam shells and working on mm tightness, I do have a known leg length difference and not sure if that's part of this. Makes no difference as to what kind of surface I'm sitting on. Had a fall down some steps last January, but this doesn't seem related to these symptoms. Did try a shoe lift in the past but it caused a lot of extra pain, it was a real nightmare for me, think it was a podiatrist who did this.   PERTINENT HISTORY: See above  PAIN:  Are you having pain? Yes: NPRS scale: 7/10 Pain location: R hamstring   Pain description: achy, shore Aggravating factors: sitting and driving  for too long  Relieving factors: pressure   PRECAUTIONS: None  RED FLAGS: None   WEIGHT BEARING RESTRICTIONS: No  FALLS:  Has patient fallen in last 6 months? No  LIVING ENVIRONMENT: Lives with: lives with their family Lives in: House/apartment Stairs: 2 STE then flight but can stay on main floor    OCCUPATION: works for insurance- sedentary job   PLOF: Independent, Independent with basic ADLs, Independent with gait, and Independent with transfers  PATIENT GOALS: take care of last big of nagging pain that is going on   NEXT MD VISIT: Dr. [name] PRN/after PT   OBJECTIVE:  Note: Objective measures were completed at Evaluation unless otherwise noted.  DIAGNOSTIC FINDINGS:    CLINICAL DATA:  Right lower leg  pain.  History of remote fracture.   EXAM: RIGHT TIBIA AND FIBULA - 2 VIEW   COMPARISON:  None Available.   FINDINGS: No evidence of acute fracture. No evidence of healing/healed fracture. The cortical margins are intact. No erosions, periostitis, or focal bone abnormality. Knee and ankle alignment are maintained. Unremarkable soft tissues.   IMPRESSION: Negative radiographs of the right tibia and fibula.    PATIENT SURVEYS:  FOTO 64  COGNITION: Overall cognitive status: Within functional limits for tasks assessed     SENSATION: Not tested    MUSCLE LENGTH:  Quads WNL Hip flexors WNL  Hamstrings severe limitation B  Piriformis mild limitation R, severe limitation L   L LE significantly shorter than the R, confirmed on xray and did try this in the past without fun   Sciatic flossing negative, SLR negative     PALPATION: R lateral proximal HS mm tight and tender, also noted non tender spasm close to ischial tub  LOWER EXTREMITY ROM:  Lumbar flexion moderate limitation, RFIS no change  Lumbar extension REIS WNL  Lateral lumbar flexion Moderate limitation B   LOWER EXTREMITY MMT:  MMT Right eval Left eval  Hip flexion 5 5  Hip  extension 4 4+  Hip abduction 4+ 4+  Hip adduction    Hip internal rotation    Hip external rotation    Knee flexion 3+ 3+  Knee extension 5 5  Ankle dorsiflexion 5 5  Ankle plantarflexion    Ankle inversion    Ankle eversion     (Blank rows = not tested)                                                                                                                                 TREATMENT DATE:  02/07/2024 Therapeutic Exercise:    Supine:   Prone:   Seated:  Standing: Neuromuscular Re-education: pelvic tilts in supine  --> quad with different hip position - 15 minutes - for improved motor re-learning. Long exhale breathing for diaphragmatic excursion - 10 minutes tactile and verbal cues Manual Therapy:PA to right hip grade II/III- felt good, STM to right hamstrings and glutes and piriformis, PA to sacrum  grade II - felt good  Therapeutic Activity: Self Care: Trigger Point Dry Needling:  Modalities:      PATIENT EDUCATION:  Education details: exam, care planning, on anatomy and breathing exercises Person educated: Patient Education method: Explanation, Demonstration, and Handouts Education comprehension: verbalized understanding, returned demonstration, and needs further education  HOME EXERCISE PROGRAM: Already has established HEP from [facility]   ASSESSMENT:  CLINICAL IMPRESSION: 02/07/2024 Increase in pain more superiorly today. Tenderness to palpation along sacral muscles, responded well to manual with reduced tension noted afterwards. No needling on this date, focused on manual interventions and movement retraining. Minimal diaphragmatic movement noted with breathing exercises. Added long exhale breathing to HEP to help improve this to improve core activation. Will  continue with current POC as tolerated.   EVAL: Patient is a 53 y.o. F who was seen today for physical therapy evaluation and treatment for Diagnosis M79.604 (ICD-10-CM) - Right leg pain M62.89  (ICD-10-CM) - Hamstring tightness of right lower extremity S76.011D (ICD-10-CM) - Muscle strain of right gluteal region, subsequent encounter S76.011A (ICD-10-CM) - Muscle strain of right gluteal region, initial encounter M25.571,G89.29,M25.572 (ICD-10-CM) - Chronic pain of both ankles. She has been seeing another physical therapist in [facility], they were unable to dry needle at that clinic and her MD asked her to come here for this intervention.   OBJECTIVE IMPAIRMENTS: decreased activity tolerance, decreased strength, increased fascial restrictions, increased muscle spasms, impaired flexibility, obesity, and pain.   ACTIVITY LIMITATIONS: sitting, locomotion level, and caring for others  PARTICIPATION LIMITATIONS: driving, shopping, community activity, and occupation  PERSONAL FACTORS: Age, Fitness, Past/current experiences, Profession, and Time since onset of injury/illness/exacerbation are also affecting patient's functional outcome.   REHAB POTENTIAL: Good  CLINICAL DECISION MAKING: Stable/uncomplicated  EVALUATION COMPLEXITY: Low   GOALS: Goals reviewed with patient? No  SHORT TERM GOALS: Target date: STG = LTGs    LONG TERM GOALS: Target date: 02/13/2024    Will be able to drive for at least 2 hours without increase in pain RLE Baseline:  Goal status: INITIAL  2.  HS and calf tightness to have improved by at least 50% BLEs  Baseline:  Goal status: INITIAL  3.  Muscle spasms in hamstrings to have resolved  Baseline:  Goal status: INITIAL  4.  Will be able to sit at work for at least 3 hours without pain increase R LE  Baseline:  Goal status: INITIAL  5.  FOTO score to be within 5 points of predicted value  Baseline:  Goal status: INITIAL     PLAN:  PT FREQUENCY: 1-2x/week  PT DURATION: 6 weeks  PLANNED INTERVENTIONS: 97164- PT Re-evaluation, 97110-Therapeutic exercises, 97530- Therapeutic activity, 97535- Self Care, 95284- Manual therapy, [id number]- Aquatic  Therapy, 97014- Electrical stimulation (unattended), Taping, Dry Needling, Cryotherapy, and Moist heat  PLAN FOR NEXT SESSION:focus on pelvic tilts and progress to stretches/eccentric work once able. Continue with PA to right hip, DN and STM   focus on dry needling per MD (has already had extensive PT at [facility] clinic), also continue working on HS flexibility and general strengthening, sciatic mobility PRN. Monitor ankles, pain is mostly resolved since order was written but we can address this if it comes back   2:12 PM, 02/07/24 Tereasa Coop, DPT Physical Therapy with [facility]

## 2024-02-13 ENCOUNTER — Encounter: Payer: Self-pay | Admitting: Physical Therapy

## 2024-02-13 ENCOUNTER — Ambulatory Visit: Payer: BC Managed Care – PPO | Admitting: Physical Therapy

## 2024-02-13 ENCOUNTER — Telehealth: Payer: Self-pay | Admitting: Sports Medicine

## 2024-02-13 DIAGNOSIS — M6281 Muscle weakness (generalized): Secondary | ICD-10-CM | POA: Diagnosis not present

## 2024-02-13 DIAGNOSIS — R29898 Other symptoms and signs involving the musculoskeletal system: Secondary | ICD-10-CM | POA: Diagnosis not present

## 2024-02-13 DIAGNOSIS — M79604 Pain in right leg: Secondary | ICD-10-CM

## 2024-02-13 NOTE — Therapy (Addendum)
 OUTPATIENT PHYSICAL THERAPY LOWER EXTREMITY TREATMENT  PHYSICAL THERAPY DISCHARGE SUMMARY  Visits from Start of Care: 5  Current functional level related to goals / functional outcomes: Not met - MD f/u   Remaining deficits: See below   Education / Equipment: See below   Patient agrees to discharge. Patient goals were not met. Patient is being discharged due to lack of progress.  9:51 AM, 03/19/24 Tereasa Coop, DPT Physical Therapy with [facility]   Patient Name: Nicole Gonzalez MRN: [id number] DOB:[birth year], 53 y.o., female Today's Date: 02/13/2024  END OF SESSION:  PT End of Session - 02/13/24 1307     Visit Number 5    Number of Visits 13    Date for PT Re-Evaluation 02/13/24    Authorization Type [employer] PPO    Authorization Time Period 01/02/24 to 02/13/24    PT Start Time 1307   late to checkin   PT Stop Time 1345    PT Time Calculation (min) 38 min    Activity Tolerance Patient tolerated treatment well    Behavior During Therapy WFL for tasks assessed/performed             Past Medical History:  Diagnosis Date   Anemia     better after hysterectomy   Cancer (HCC)    basil cell carcinoma/forehead   Diabetes mellitus without complication (HCC)    DIABETES MELLITUS, GESTATIONAL, HX OF 06/02/2009   Qualifier: Diagnosis of  By: Fabian Sharp MD, Neta Mends    Diverticulosis    Family history of breast cancer    Family history of genetic disease carrier    father has CDKN2A and ATM mutation   Family history of melanoma    Family history of pancreatic cancer    Fracture of lower leg     fall 2011   GERD (gastroesophageal reflux disease)    History of basal cell carcinoma excision    History of gestational diabetes    x2   HTN (hypertension)    Hyperlipidemia    [id number] 043 trx    Increased frequency of headaches 03/15/2012   Internal hemorrhoids    Obesity    Seizures (HCC)    as an infant d/t fever   Tubular adenoma of colon    UNSPECIFIED TACHYCARDIA  09/01/2010   Qualifier: Diagnosis of  By: Abner Greenspan MD, Misty Stanley     Past Surgical History:  Procedure Laterality Date   ABDOMINAL HYSTERECTOMY  06/29/2010    question had abnormal cells on Path, fibroids   BASAL CELL CARCINOMA EXCISION     BREAST BIOPSY Right 2009   COLONOSCOPY WITH PROPOFOL N/A 01/17/2019   Procedure: COLONOSCOPY WITH PROPOFOL;  Surgeon: Rachael Fee, MD;  Location: [facility];  Service: Endoscopy;  Laterality: N/A;   ESOPHAGOGASTRODUODENOSCOPY (EGD) WITH PROPOFOL N/A 01/17/2019   Procedure: ESOPHAGOGASTRODUODENOSCOPY (EGD) WITH PROPOFOL;  Surgeon: Rachael Fee, MD;  Location: [facility];  Service: Endoscopy;  Laterality: N/A;   EUS N/A 01/17/2019   Procedure: UPPER ENDOSCOPIC ULTRASOUND (EUS) RADIAL;  Surgeon: Rachael Fee, MD;  Location: [facility];  Service: Endoscopy;  Laterality: N/A;   POLYPECTOMY  01/17/2019   Procedure: POLYPECTOMY;  Surgeon: Rachael Fee, MD;  Location: [facility];  Service: Endoscopy;;   TONSILLECTOMY     Patient Active Problem List   Diagnosis Date Noted   History of colonic polyps    Polyp of ascending colon    Genetic testing 05/23/2018   Monoallelic mutation of CDKN2A gene 05/21/2018  Family history of pancreatic cancer    Family history of breast cancer    Family history of melanoma    Family history of gene mutation 04/06/2018   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/2018   Hyperlipidemia 03/27/2015   Family history of breast cancer in female 03/27/2015   Rhinitis, allergic 10/16/2014   Left maxillary sinusitis 10/16/2014   Prediabetes 09/24/2014   Plantar fasciitis 10/22/2013   Abdominal swelling, mass, or lump felt by patient 03/26/2013   Leg length discrepancy 03/22/2013   Newly diagnosed diabetes (HCC) 03/22/2013   Abdominal pain, chronic, right lower quadrant 03/22/2013   History of basal cell carcinoma excision    Visit for preventive health examination 03/15/2012   Hx of abdominal pain 03/15/2012   Anemia    OBESITY  07/15/2010   Allergic rhinitis 07/15/2010   HYPERGLYCEMIA 07/15/2010   Essential hypertension 06/02/2009   DIABETES MELLITUS, GESTATIONAL, HX OF 06/02/2009   KNEE PAIN 06/03/2008   HYPERLIPIDEMIA 10/09/2007   Esophagitis 10/09/2007   Hematuria 10/09/2007   HIDRADENITIS 10/09/2007    PCP: Berniece Andreas MD   REFERRING PROVIDER: Richardean Sale, DO  REFERRING DIAG: Diagnosis M79.604 (ICD-10-CM) - Right leg pain M62.89 (ICD-10-CM) - Hamstring tightness of right lower extremity S76.011D (ICD-10-CM) - Muscle strain of right gluteal region, subsequent encounter S76.011A (ICD-10-CM) - Muscle strain of right gluteal region, initial encounter M25.571,G89.29,M25.572 (ICD-10-CM) - Chronic pain of both ankles  THERAPY DIAG:  Pain in right leg  Muscle weakness (generalized)  Other symptoms and signs involving the musculoskeletal system  Rationale for Evaluation and Treatment: Rehabilitation  ONSET DATE: September 2024  SUBJECTIVE:   SUBJECTIVE STATEMENT: 02/13/2024 States she has been doing the breathing exercises 2x/day and she feels lighter and better. States that the pain is still the same especially after she has driven.   Eval: I went to see Dr. [name] for my calf in October last year, it got better with some rest but then I started getting some pain in my left glute. Have been getting PT since November and its not better, the MD suggested dry needling. I can't drive very far at all without severe pain in that area, driving to [place] or [place] can be very painful. I was seeing [name] [facility] [place] but she does not do dry needling. We worked on some strengthening like clam shells and working on mm tightness, I do have a known leg length difference and not sure if that's part of this. Makes no difference as to what kind of surface I'm sitting on. Had a fall down some steps last January, but this doesn't seem related to these symptoms. Did try a shoe lift in the past but it caused  a lot of extra pain, it was a real nightmare for me, think it was a podiatrist who did this.   PERTINENT HISTORY: See above  PAIN:  Are you having pain? Yes: NPRS scale: 7/10 Pain location: R hamstring   Pain description: achy, shore Aggravating factors: sitting and driving for too long  Relieving factors: pressure   PRECAUTIONS: None  RED FLAGS: None   WEIGHT BEARING RESTRICTIONS: No  FALLS:  Has patient fallen in last 6 months? No  LIVING ENVIRONMENT: Lives with: lives with their family Lives in: House/apartment Stairs: 2 STE then flight but can stay on main floor    OCCUPATION: works for insurance- sedentary job   PLOF: Independent, Independent with basic ADLs, Independent with gait, and Independent with transfers  PATIENT GOALS: take care of  last big of nagging pain that is going on   NEXT MD VISIT: 02/22/24  OBJECTIVE:  Note: Objective measures were completed at Evaluation unless otherwise noted.  DIAGNOSTIC FINDINGS:    CLINICAL DATA:  Right lower leg pain.  History of remote fracture.   EXAM: RIGHT TIBIA AND FIBULA - 2 VIEW   COMPARISON:  None Available.   FINDINGS: No evidence of acute fracture. No evidence of healing/healed fracture. The cortical margins are intact. No erosions, periostitis, or focal bone abnormality. Knee and ankle alignment are maintained. Unremarkable soft tissues.   IMPRESSION: Negative radiographs of the right tibia and fibula.    PATIENT SURVEYS:  FOTO 64 02/13/24 = 38%   COGNITION: Overall cognitive status: Within functional limits for tasks assessed     SENSATION: Not tested    MUSCLE LENGTH:  Quads WNL Hip flexors WNL  Hamstrings severe limitation B  Piriformis mild limitation R, severe limitation L   L LE significantly shorter than the R, confirmed on xray and did try this in the past without fun   Sciatic flossing negative, SLR negative     PALPATION: R lateral proximal HS mm tight and tender, also  noted non tender spasm close to ischial tub  LOWER EXTREMITY ROM:  Lumbar flexion moderate limitation, RFIS no change  Lumbar extension REIS WNL  Lateral lumbar flexion Moderate limitation B   LOWER EXTREMITY MMT:  MMT Right 02/13/24 Left 02/13/24  Hip flexion 5 5  Hip extension 4+ 4+  Hip abduction 4+ 4+  Hip adduction    Hip internal rotation    Hip external rotation    Knee flexion 3+* 4-  Knee extension 5 5  Ankle dorsiflexion 5 5  Ankle plantarflexion    Ankle inversion    Ankle eversion     (Blank rows = not tested)  * pain                                                                                                                                TREATMENT DATE:  02/13/2024 Therapeutic Exercise:   Objective measures updated and goals reviewed HEP reviewed Supine:   Prone:   Seated:  Standing: Neuromuscular Re-education:  Manual Therapy:PA to right hip grade II/III- felt good, STM to right hamstrings and glutes and piriformis Therapeutic Activity: Self Care: Trigger Point Dry Needling:  Modalities:  Trigger Point Dry Needling  Subsequent Treatment: Instructions provided previously at initial dry needling treatment.   Patient Verbal Consent Given: Yes Education Handout Provided: Previously Provided Muscles Treated: right hamstring x3 Electrical Stimulation Performed: No Treatment Response/Outcome: reduced tension noted afterwards      PATIENT EDUCATION:  Education details: HEP, anatomy, FOTO score, and current presentation Person educated: Patient Education method: Explanation, Demonstration, and Handouts Education comprehension: verbalized understanding, returned demonstration, and needs further education  HOME EXERCISE PROGRAM: Already has established HEP from [facility]   ASSESSMENT:  CLINICAL IMPRESSION: 02/13/2024 Updated  objective information and Foto score.  Overall patient has made some improvements in strength but continues to have  limitations in function which was represented in Foto score.  Patient actually scored significantly lower today on the [facility] however patient in increased pain compared to previous visits.  Discussed follow-up with MD to make sure that imaging is not indicated.  Will be putting patient on hold secondary to lack of progress and need to follow-up with MD.  EVAL: Patient is a 53 y.o. F who was seen today for physical therapy evaluation and treatment for Diagnosis M79.604 (ICD-10-CM) - Right leg pain M62.89 (ICD-10-CM) - Hamstring tightness of right lower extremity S76.011D (ICD-10-CM) - Muscle strain of right gluteal region, subsequent encounter S76.011A (ICD-10-CM) - Muscle strain of right gluteal region, initial encounter M25.571,G89.29,M25.572 (ICD-10-CM) - Chronic pain of both ankles. She has been seeing another physical therapist in [facility], they were unable to dry needle at that clinic and her MD asked her to come here for this intervention.   OBJECTIVE IMPAIRMENTS: decreased activity tolerance, decreased strength, increased fascial restrictions, increased muscle spasms, impaired flexibility, obesity, and pain.   ACTIVITY LIMITATIONS: sitting, locomotion level, and caring for others  PARTICIPATION LIMITATIONS: driving, shopping, community activity, and occupation  PERSONAL FACTORS: Age, Fitness, Past/current experiences, Profession, and Time since onset of injury/illness/exacerbation are also affecting patient's functional outcome.   REHAB POTENTIAL: Good  CLINICAL DECISION MAKING: Stable/uncomplicated  EVALUATION COMPLEXITY: Low   GOALS: Goals reviewed with patient? No  SHORT TERM GOALS: Target date: STG = LTGs    LONG TERM GOALS: Target date: 02/13/2024    Will be able to drive for at least 2 hours without increase in pain RLE Baseline:  Goal status: PROGRESSING - unable  2.  HS and calf tightness to have improved by at least 50% BLEs  Baseline:  Goal status: PROGRESSING -0%  better   3.  Muscle spasms in hamstrings to have resolved  Baseline:  Goal status: PROGRESSING-   4.  Will be able to sit at work for at least 3 hours without pain increase R LE  Baseline:  Goal status: PROGRESSING - still painful   5.  FOTO score to be within 5 points of predicted value  Baseline:  Goal status: PROGRESSING     PLAN:  PT FREQUENCY: 1-2x/week  PT DURATION: 6 weeks  PLANNED INTERVENTIONS: 97164- PT Re-evaluation, 97110-Therapeutic exercises, 97530- Therapeutic activity, 97535- Self Care, 04540- Manual therapy, [id number]- Aquatic Therapy, 97014- Electrical stimulation (unattended), Taping, Dry Needling, Cryotherapy, and Moist heat  PLAN FOR NEXT SESSION: pt placed on hold secondary to MD f/u  4:13 PM, 02/13/24 Tereasa Coop, DPT Physical Therapy with [facility]

## 2024-02-13 NOTE — Telephone Encounter (Signed)
 Patient called to let Dr.Jackson know that today was last day of PT until she sees Dr.Jackson. Patient called to get an earlier appointment but we do not have openings. Physical therapy  wanted her to go ahead and see Dr. [name] as it may be time for an MRI she stated. Patient states she is not seeing improvement.

## 2024-02-19 ENCOUNTER — Encounter: Payer: BC Managed Care – PPO | Admitting: Physical Therapy

## 2024-02-20 ENCOUNTER — Encounter: Payer: BC Managed Care – PPO | Admitting: Physical Therapy

## 2024-02-20 ENCOUNTER — Other Ambulatory Visit: Payer: Self-pay | Admitting: Internal Medicine

## 2024-02-21 NOTE — Progress Notes (Unsigned)
 Nicole Gonzalez D.[facility] [address] [place] [address] Phone: [phone]   Assessment and Plan:     There are no diagnoses linked to this encounter.  ***   Pertinent previous records reviewed include ***    Follow Up: ***     Subjective:   I, Nicole Gonzalez, am serving as a Neurosurgeon for Doctor Richardean Sale   Chief Complaint: right leg pain    HPI:    08/29/2023 Patient is a 53 year old female complaining of right leg pain. Patient states that she right knee to ankle pain . No swelling no bruisng . Advil doesn't help. Isnt able to sleep through the night. Long distance driving is painful and when she walks on pavement. She was told that her hip are not aligned. Intermittent pain for a few months. Pain has been increasing over the last week . Does endorse bilateral foot pain on the top of the foot    09/22/2023 Patient states that she has had small improvement. Meloxicam caused joint pain. She still feels as though something is not right . She is TTP above the ankle     11/02/2023 Patient states still has pain when she drives long distances    12/01/2023 Patient states that she is the same . Thanksgiving weekend had a spasm down the leg . She had a flare of the left knee after PT but both legs were worked on  07/24/2024 Patient states  Relevant Historical Information: Hypertension, DM type II    Additional pertinent review of systems negative.   Current Outpatient Medications:    benzonatate (TESSALON) 100 MG capsule, Take 1 capsule (100 mg total) by mouth 3 (three) times daily as needed. (Patient not taking: Reported on 01/16/2024), Disp: 30 capsule, Rfl: 0   Cyanocobalamin (VITAMIN B-12) 1000 MCG SUBL, Place 1 tablet (1,000 mcg total) under the tongue daily. (Patient not taking: Reported on 01/16/2024), Disp: 90 tablet, Rfl: 3   fexofenadine (ALLEGRA) 180 MG tablet, Take 180 mg by mouth daily as needed (allergies.). In the  morning  (Patient not taking: Reported on 01/16/2024), Disp: , Rfl:    glucose blood (CONTOUR NEXT TEST) test strip, 100 each by Other route See admin instructions. Use as instructed, Disp: 100 strip, Rfl: 3   ibuprofen (ADVIL,MOTRIN) 200 MG tablet, Take 600 mg by mouth every 8 (eight) hours as needed (pain). (Patient not taking: Reported on 01/16/2024), Disp: , Rfl:    levocetirizine (XYZAL) 5 MG tablet, Take 5 mg by mouth every evening. , Disp: , Rfl:    lisinopril (ZESTRIL) 10 MG tablet, TAKE 1 TABLET(10 MG) BY MOUTH DAILY FOR HIGH BLOOD PRESSURE, Disp: 90 tablet, Rfl: 0   LORazepam (ATIVAN) 1 MG tablet, Take 1 tablet (1 mg total) by mouth as directed. 1 hour prior to your MRI appointment. DO NOT drive while taking this medication (Patient not taking: Reported on 01/16/2024), Disp: 1 tablet, Rfl: 0   metFORMIN (GLUCOPHAGE-XR) 500 MG 24 hr tablet, TAKE 4 TABLETS BY MOUTH DAILY, Disp: 360 tablet, Rfl: 3   Microlet Lancets MISC, USE TO CHECK BLOOD SUGAR ONE TO TWO TIMES DAILY, Disp: 100 each, Rfl: 3   montelukast (SINGULAIR) 10 MG tablet, Take 10 mg by mouth every evening. , Disp: , Rfl:    Multiple Vitamin (MULTIVITAMIN WITH MINERALS) TABS tablet, Take 1 tablet by mouth daily., Disp: , Rfl:    omeprazole (PRILOSEC) 20 MG capsule, Take 20 mg by mouth every  evening. , Disp: , Rfl:    QNASL 80 MCG/ACT AERS, Place 2 sprays into the nose every evening. , Disp: , Rfl:    simvastatin (ZOCOR) 40 MG tablet, TAKE 1 TABLET(40 MG) BY MOUTH DAILY AT 6 PM, Disp: 90 tablet, Rfl: 1   tirzepatide (MOUNJARO) 10 MG/0.5ML Pen, Inject 10 mg into the skin once a week. Dosage change, Disp: 6 mL, Rfl: 2   Objective:     There were no vitals filed for this visit.    There is no height or weight on file to calculate BMI.    Physical Exam:    ***   Electronically signed by:  Nicole Gonzalez D.[facility] 7:42 AM 02/21/24

## 2024-02-22 ENCOUNTER — Ambulatory Visit (INDEPENDENT_AMBULATORY_CARE_PROVIDER_SITE_OTHER)

## 2024-02-22 ENCOUNTER — Ambulatory Visit: Payer: BC Managed Care – PPO | Admitting: Sports Medicine

## 2024-02-22 VITALS — BP 110/70 | HR 94 | Ht 64.0 in | Wt 192.4 lb

## 2024-02-22 DIAGNOSIS — S76011D Strain of muscle, fascia and tendon of right hip, subsequent encounter: Secondary | ICD-10-CM

## 2024-02-22 DIAGNOSIS — M6289 Other specified disorders of muscle: Secondary | ICD-10-CM | POA: Diagnosis not present

## 2024-02-22 DIAGNOSIS — M1611 Unilateral primary osteoarthritis, right hip: Secondary | ICD-10-CM | POA: Diagnosis not present

## 2024-02-22 DIAGNOSIS — M48061 Spinal stenosis, lumbar region without neurogenic claudication: Secondary | ICD-10-CM | POA: Diagnosis not present

## 2024-02-22 DIAGNOSIS — M79604 Pain in right leg: Secondary | ICD-10-CM | POA: Diagnosis not present

## 2024-02-22 DIAGNOSIS — M545 Low back pain, unspecified: Secondary | ICD-10-CM | POA: Diagnosis not present

## 2024-02-22 DIAGNOSIS — M25551 Pain in right hip: Secondary | ICD-10-CM | POA: Diagnosis not present

## 2024-02-22 DIAGNOSIS — M47816 Spondylosis without myelopathy or radiculopathy, lumbar region: Secondary | ICD-10-CM | POA: Diagnosis not present

## 2024-02-22 DIAGNOSIS — F40240 Claustrophobia: Secondary | ICD-10-CM

## 2024-02-22 MED ORDER — LORAZEPAM 0.5 MG PO TABS
ORAL_TABLET | ORAL | 0 refills | Status: DC
Start: 2024-02-22 — End: 2024-08-16

## 2024-02-22 NOTE — Patient Instructions (Addendum)
 MRI at [facility].  Start Ativan. Follow up 5 days after MRI.

## 2024-02-23 DIAGNOSIS — E119 Type 2 diabetes mellitus without complications: Secondary | ICD-10-CM | POA: Diagnosis not present

## 2024-02-23 LAB — HM DIABETES EYE EXAM

## 2024-03-02 ENCOUNTER — Ambulatory Visit

## 2024-03-02 DIAGNOSIS — D1809 Hemangioma of other sites: Secondary | ICD-10-CM | POA: Diagnosis not present

## 2024-03-02 DIAGNOSIS — M79604 Pain in right leg: Secondary | ICD-10-CM | POA: Diagnosis not present

## 2024-03-02 DIAGNOSIS — M25551 Pain in right hip: Secondary | ICD-10-CM | POA: Diagnosis not present

## 2024-03-02 DIAGNOSIS — F40240 Claustrophobia: Secondary | ICD-10-CM

## 2024-03-02 DIAGNOSIS — Z90710 Acquired absence of both cervix and uterus: Secondary | ICD-10-CM | POA: Diagnosis not present

## 2024-03-02 DIAGNOSIS — M6289 Other specified disorders of muscle: Secondary | ICD-10-CM | POA: Diagnosis not present

## 2024-03-02 DIAGNOSIS — S76011D Strain of muscle, fascia and tendon of right hip, subsequent encounter: Secondary | ICD-10-CM

## 2024-03-02 DIAGNOSIS — M1611 Unilateral primary osteoarthritis, right hip: Secondary | ICD-10-CM | POA: Diagnosis not present

## 2024-03-05 DIAGNOSIS — L821 Other seborrheic keratosis: Secondary | ICD-10-CM | POA: Diagnosis not present

## 2024-03-05 DIAGNOSIS — L814 Other melanin hyperpigmentation: Secondary | ICD-10-CM | POA: Diagnosis not present

## 2024-03-05 DIAGNOSIS — D225 Melanocytic nevi of trunk: Secondary | ICD-10-CM | POA: Diagnosis not present

## 2024-03-05 DIAGNOSIS — Z85828 Personal history of other malignant neoplasm of skin: Secondary | ICD-10-CM | POA: Diagnosis not present

## 2024-03-07 ENCOUNTER — Encounter: Payer: Self-pay | Admitting: Sports Medicine

## 2024-03-07 NOTE — Progress Notes (Deleted)
 Nicole Gonzalez D.[facility] [address] [place] [address] Phone: [phone]   Assessment and Plan:     There are no diagnoses linked to this encounter.  ***   Pertinent previous records reviewed include ***    Follow Up: ***     Subjective:   I, Nicole Gonzalez, am serving as a Neurosurgeon for Doctor Richardean Sale  Chief Complaint: right leg pain    HPI:    08/29/2023 Patient is a 53 year old female complaining of right leg pain. Patient states that she right knee to ankle pain . No swelling no bruisng . Advil doesn't help. Isnt able to sleep through the night. Long distance driving is painful and when she walks on pavement. She was told that her hip are not aligned. Intermittent pain for a few months. Pain has been increasing over the last week . Does endorse bilateral foot pain on the top of the foot    09/22/2023 Patient states that she has had small improvement. Meloxicam caused joint pain. She still feels as though something is not right . She is TTP above the ankle     11/02/2023 Patient states still has pain when she drives long distances    12/01/2023 Patient states that she is the same . Thanksgiving weekend had a spasm down the leg . She had a flare of the left knee after PT but both legs were worked on   02/22/2024 Patient states that is is the same. Kind of got kicked out of therapy because it wasn't helping. Therapist modified the exercises to reduce the stress. Still in the hamstring area. Dry needling helped for a few days but it did not last long. When she feels the pain it spasms instead of being a sharp pain.    03/08/2024 Patient states  Relevant Historical Information: Hypertension, DM type II  Additional pertinent review of systems negative.   Current Outpatient Medications:    benzonatate (TESSALON) 100 MG capsule, Take 1 capsule (100 mg total) by mouth 3 (three) times daily as needed., Disp: 30 capsule,  Rfl: 0   Cyanocobalamin (VITAMIN B-12) 1000 MCG SUBL, Place 1 tablet (1,000 mcg total) under the tongue daily., Disp: 90 tablet, Rfl: 3   fexofenadine (ALLEGRA) 180 MG tablet, Take 180 mg by mouth daily as needed (allergies.). In the morning, Disp: , Rfl:    glucose blood (CONTOUR NEXT TEST) test strip, 100 each by Other route See admin instructions. Use as instructed, Disp: 100 strip, Rfl: 3   ibuprofen (ADVIL,MOTRIN) 200 MG tablet, Take 600 mg by mouth every 8 (eight) hours as needed (pain)., Disp: , Rfl:    levocetirizine (XYZAL) 5 MG tablet, Take 5 mg by mouth every evening. , Disp: , Rfl:    lisinopril (ZESTRIL) 10 MG tablet, TAKE 1 TABLET(10 MG) BY MOUTH DAILY FOR HIGH BLOOD PRESSURE, Disp: 90 tablet, Rfl: 0   LORazepam (ATIVAN) 0.5 MG tablet, 1-2 tabs 30 - 60 min prior to MRI. Do not drive with this medicine., Disp: 4 tablet, Rfl: 0   LORazepam (ATIVAN) 1 MG tablet, Take 1 tablet (1 mg total) by mouth as directed. 1 hour prior to your MRI appointment. DO NOT drive while taking this medication, Disp: 1 tablet, Rfl: 0   metFORMIN (GLUCOPHAGE-XR) 500 MG 24 hr tablet, TAKE 4 TABLETS BY MOUTH DAILY, Disp: 360 tablet, Rfl: 3   Microlet Lancets MISC, USE TO CHECK BLOOD SUGAR ONE TO TWO TIMES DAILY, Disp:  100 each, Rfl: 3   montelukast (SINGULAIR) 10 MG tablet, Take 10 mg by mouth every evening. , Disp: , Rfl:    Multiple Vitamin (MULTIVITAMIN WITH MINERALS) TABS tablet, Take 1 tablet by mouth daily., Disp: , Rfl:    omeprazole (PRILOSEC) 20 MG capsule, Take 20 mg by mouth every evening. , Disp: , Rfl:    QNASL 80 MCG/ACT AERS, Place 2 sprays into the nose every evening. , Disp: , Rfl:    simvastatin (ZOCOR) 40 MG tablet, TAKE 1 TABLET(40 MG) BY MOUTH DAILY AT 6 PM, Disp: 90 tablet, Rfl: 1   tirzepatide (MOUNJARO) 10 MG/0.5ML Pen, Inject 10 mg into the skin once a week. Dosage change, Disp: 6 mL, Rfl: 2   Objective:     There were no vitals filed for this visit.    There is no height or weight  on file to calculate BMI.    Physical Exam:    ***   Electronically signed by:  Nicole Gonzalez D.[facility] 7:41 AM 03/07/24

## 2024-03-08 ENCOUNTER — Ambulatory Visit: Admitting: Sports Medicine

## 2024-03-08 ENCOUNTER — Encounter: Payer: Self-pay | Admitting: Sports Medicine

## 2024-03-08 DIAGNOSIS — M79604 Pain in right leg: Secondary | ICD-10-CM | POA: Diagnosis not present

## 2024-03-08 DIAGNOSIS — M6289 Other specified disorders of muscle: Secondary | ICD-10-CM

## 2024-03-08 DIAGNOSIS — S76319A Strain of muscle, fascia and tendon of the posterior muscle group at thigh level, unspecified thigh, initial encounter: Secondary | ICD-10-CM

## 2024-03-08 NOTE — Progress Notes (Addendum)
 Ben Jaskiran Pata D.[facility] [address] [place] [address] Phone: [phone]    Virtual Visit Note  Assessment and Plan:     1. Partial hamstring tear, initial encounter 2. Right leg pain 3. Hamstring tightness of right lower extremity  -Chronic with exacerbation, subsequent visit - Reviewed MRI results with patient and discussed low-grade partial tearing of hamstring tendon at conjoint tendon that would explain patient's ongoing pain that has not significantly improved with conservative therapy.  Mild osteoarthritis also present - Discussed next steps in therapy which could include prednisone course versus CSI versus PRP injection.  Patient is most interested in PRP injection with past medical history of DM type II and I agree   I connected with patient by Doximity video enabled telemedicine application and verified that I am speaking with the correct person using two identifiers. Patient agreed to proceed via telephone. I discussed the limitations of evaluation and management by telemedicine and the availability of in person appointments. The patient expressed understanding and agreed to proceed.  Location: Patient: home Provider: In office setting  Time of visit 12 minutes, which included telephone discussion, chart review, treatment plan discussion with patient, and documentation at today's telemedicine visit.    Pertinent previous records reviewed include right hip MRI 03/08/2024  Follow Up: - Patient to reach out to our clinic to schedule follow-up visit for PRP injection     Subjective:    Chief Complaint: Continued right posterior hip pain  HPI:   03/08/24 Office visit was transition to video visit due to MRI not being read at patient's scheduled MRI time.  Patient still is experiencing posterior right hip pain.  Relevant Historical Information: Hypertension, DM type II  Additional pertinent review of systems  negative.   Current Outpatient Medications:    benzonatate (TESSALON) 100 MG capsule, Take 1 capsule (100 mg total) by mouth 3 (three) times daily as needed., Disp: 30 capsule, Rfl: 0   Cyanocobalamin (VITAMIN B-12) 1000 MCG SUBL, Place 1 tablet (1,000 mcg total) under the tongue daily., Disp: 90 tablet, Rfl: 3   fexofenadine (ALLEGRA) 180 MG tablet, Take 180 mg by mouth daily as needed (allergies.). In the morning, Disp: , Rfl:    glucose blood (CONTOUR NEXT TEST) test strip, 100 each by Other route See admin instructions. Use as instructed, Disp: 100 strip, Rfl: 3   ibuprofen (ADVIL,MOTRIN) 200 MG tablet, Take 600 mg by mouth every 8 (eight) hours as needed (pain)., Disp: , Rfl:    levocetirizine (XYZAL) 5 MG tablet, Take 5 mg by mouth every evening. , Disp: , Rfl:    lisinopril (ZESTRIL) 10 MG tablet, TAKE 1 TABLET(10 MG) BY MOUTH DAILY FOR HIGH BLOOD PRESSURE, Disp: 90 tablet, Rfl: 0   LORazepam (ATIVAN) 0.5 MG tablet, 1-2 tabs 30 - 60 min prior to MRI. Do not drive with this medicine., Disp: 4 tablet, Rfl: 0   LORazepam (ATIVAN) 1 MG tablet, Take 1 tablet (1 mg total) by mouth as directed. 1 hour prior to your MRI appointment. DO NOT drive while taking this medication, Disp: 1 tablet, Rfl: 0   metFORMIN (GLUCOPHAGE-XR) 500 MG 24 hr tablet, TAKE 4 TABLETS BY MOUTH DAILY, Disp: 360 tablet, Rfl: 3   Microlet Lancets MISC, USE TO CHECK BLOOD SUGAR ONE TO TWO TIMES DAILY, Disp: 100 each, Rfl: 3   montelukast (SINGULAIR) 10 MG tablet, Take 10 mg by mouth every evening. , Disp: , Rfl:    Multiple Vitamin (  MULTIVITAMIN WITH MINERALS) TABS tablet, Take 1 tablet by mouth daily., Disp: , Rfl:    omeprazole (PRILOSEC) 20 MG capsule, Take 20 mg by mouth every evening. , Disp: , Rfl:    QNASL 80 MCG/ACT AERS, Place 2 sprays into the nose every evening. , Disp: , Rfl:    simvastatin (ZOCOR) 40 MG tablet, TAKE 1 TABLET(40 MG) BY MOUTH DAILY AT 6 PM, Disp: 90 tablet, Rfl: 1   tirzepatide (MOUNJARO) 10  MG/0.5ML Pen, Inject 10 mg into the skin once a week. Dosage change, Disp: 6 mL, Rfl: 2   Objective:    Alert and doing well.  Very pleasant over the phone  Electronically signed by:  Marshall Skeeter D.[facility] 9:20 AM 03/08/24

## 2024-03-27 NOTE — Progress Notes (Unsigned)
    Aleen Sells D.[facility] [address] [place] [address] Phone: [phone]   Assessment and Plan:     There are no diagnoses linked to this encounter.  ***   Pertinent previous records reviewed include ***    Follow Up: ***     Subjective:   I, Nicole Gonzalez, am serving as a Neurosurgeon for Doctor [employer]  Chief Complaint: Continued right posterior hip pain   HPI:    03/08/24 Office visit was transition to video visit due to MRI not being read at patient's scheduled MRI time.  Patient still is experiencing posterior right hip pain.  03/28/2024 PRP   Relevant Historical Information: Hypertension, DM type II   Additional pertinent review of systems negative. Additional pertinent review of systems negative.   Current Outpatient Medications:    benzonatate (TESSALON) 100 MG capsule, Take 1 capsule (100 mg total) by mouth 3 (three) times daily as needed., Disp: 30 capsule, Rfl: 0   Cyanocobalamin (VITAMIN B-12) 1000 MCG SUBL, Place 1 tablet (1,000 mcg total) under the tongue daily., Disp: 90 tablet, Rfl: 3   fexofenadine (ALLEGRA) 180 MG tablet, Take 180 mg by mouth daily as needed (allergies.). In the morning, Disp: , Rfl:    glucose blood (CONTOUR NEXT TEST) test strip, 100 each by Other route See admin instructions. Use as instructed, Disp: 100 strip, Rfl: 3   ibuprofen (ADVIL,MOTRIN) 200 MG tablet, Take 600 mg by mouth every 8 (eight) hours as needed (pain)., Disp: , Rfl:    levocetirizine (XYZAL) 5 MG tablet, Take 5 mg by mouth every evening. , Disp: , Rfl:    lisinopril (ZESTRIL) 10 MG tablet, TAKE 1 TABLET(10 MG) BY MOUTH DAILY FOR HIGH BLOOD PRESSURE, Disp: 90 tablet, Rfl: 0   LORazepam (ATIVAN) 0.5 MG tablet, 1-2 tabs 30 - 60 min prior to MRI. Do not drive with this medicine., Disp: 4 tablet, Rfl: 0   LORazepam (ATIVAN) 1 MG tablet, Take 1 tablet (1 mg total) by mouth as directed. 1 hour prior to your MRI appointment.  DO NOT drive while taking this medication, Disp: 1 tablet, Rfl: 0   metFORMIN (GLUCOPHAGE-XR) 500 MG 24 hr tablet, TAKE 4 TABLETS BY MOUTH DAILY, Disp: 360 tablet, Rfl: 3   Microlet Lancets MISC, USE TO CHECK BLOOD SUGAR ONE TO TWO TIMES DAILY, Disp: 100 each, Rfl: 3   montelukast (SINGULAIR) 10 MG tablet, Take 10 mg by mouth every evening. , Disp: , Rfl:    Multiple Vitamin (MULTIVITAMIN WITH MINERALS) TABS tablet, Take 1 tablet by mouth daily., Disp: , Rfl:    omeprazole (PRILOSEC) 20 MG capsule, Take 20 mg by mouth every evening. , Disp: , Rfl:    QNASL 80 MCG/ACT AERS, Place 2 sprays into the nose every evening. , Disp: , Rfl:    simvastatin (ZOCOR) 40 MG tablet, TAKE 1 TABLET(40 MG) BY MOUTH DAILY AT 6 PM, Disp: 90 tablet, Rfl: 1   tirzepatide (MOUNJARO) 10 MG/0.5ML Pen, Inject 10 mg into the skin once a week. Dosage change, Disp: 6 mL, Rfl: 2   Objective:     There were no vitals filed for this visit.    There is no height or weight on file to calculate BMI.    Physical Exam:    ***   Electronically signed by:  Aleen Sells D[facility] 7:39 AM 03/27/24

## 2024-03-28 ENCOUNTER — Ambulatory Visit (INDEPENDENT_AMBULATORY_CARE_PROVIDER_SITE_OTHER): Payer: Self-pay | Admitting: Sports Medicine

## 2024-03-28 ENCOUNTER — Ambulatory Visit: Payer: Self-pay

## 2024-03-28 DIAGNOSIS — M79604 Pain in right leg: Secondary | ICD-10-CM

## 2024-03-28 DIAGNOSIS — S76319D Strain of muscle, fascia and tendon of the posterior muscle group at thigh level, unspecified thigh, subsequent encounter: Secondary | ICD-10-CM

## 2024-03-28 DIAGNOSIS — M6289 Other specified disorders of muscle: Secondary | ICD-10-CM

## 2024-03-28 NOTE — Patient Instructions (Signed)
 6 week follow up

## 2024-04-04 ENCOUNTER — Other Ambulatory Visit: Payer: Self-pay | Admitting: Internal Medicine

## 2024-04-12 ENCOUNTER — Encounter: Payer: Self-pay | Admitting: Internal Medicine

## 2024-04-15 ENCOUNTER — Telehealth: Payer: Self-pay

## 2024-04-15 ENCOUNTER — Other Ambulatory Visit (HOSPITAL_COMMUNITY): Payer: Self-pay

## 2024-04-15 NOTE — Telephone Encounter (Signed)
 Pharmacy Patient Advocate Encounter   Received notification from Pt Calls Messages that prior authorization for Mounjaro  5 is required/requested.   Insurance verification completed.   The patient is insured through [facility] .   Per test claim: The current 84 day co-pay is, $25.00.  No PA needed at this time. This test claim was processed through [facility]- copay amounts may vary at other pharmacies due to pharmacy/plan contracts, or as the patient moves through the different stages of their insurance plan.

## 2024-04-17 ENCOUNTER — Telehealth: Payer: Self-pay

## 2024-04-17 NOTE — Telephone Encounter (Signed)
 Copied from CRM [id number]. Topic: Clinical - Prescription Issue >> Apr 17, 2024 10:13 AM Juluis Ok wrote: Reason for EAV:[id number] states that she received a call from her pharmacy stating that her cost for MOUNJARO  10 MG/0.5ML Pen is $1300. She states that her pharmacy is needing additional information from provider in order for her to get medication as a lower cost. Patient request a callback/information be resent to pharmacy.

## 2024-04-17 NOTE — Telephone Encounter (Signed)
 Spoke to pt. Pt inform her last inject was on Sunday May, 4. Inform pt since she is taking it weekly, she does not have to start over. It will be due this Sunday. Also update pt on her PA for mounjaro . Will send info to pt's mychart on PA for mounjaro  from the PA Authorization team.

## 2024-04-17 NOTE — Telephone Encounter (Signed)
 What do I need to do with this message not  understanding   OK to Refill her  mounjaro    weekly last rx was  10 mg  weekly ordered 4 28

## 2024-04-18 NOTE — Telephone Encounter (Signed)
 Follow up pharmacy. He inform that pt had pick it up yesterday and paid for $25.00 co-pay.

## 2024-04-24 DIAGNOSIS — H8113 Benign paroxysmal vertigo, bilateral: Secondary | ICD-10-CM | POA: Diagnosis not present

## 2024-04-24 DIAGNOSIS — J3089 Other allergic rhinitis: Secondary | ICD-10-CM | POA: Diagnosis not present

## 2024-05-09 NOTE — Progress Notes (Unsigned)
    Nicole Gonzalez D.[facility] [address] [place] [address] Phone: [phone]   Assessment and Plan:     There are no diagnoses linked to this encounter.  ***   Pertinent previous records reviewed include ***    Follow Up: ***     Subjective:   I, Nicole Gonzalez, am serving as a Neurosurgeon for Nicole Gonzalez  Chief Complaint: Continued right posterior hip pain   HPI:    03/08/24 Office visit was transition to video visit due to MRI not being read at patient's scheduled MRI time.  Patient still is experiencing posterior right hip pain.   03/28/2024 PRP  05/10/2024 Patient states    Relevant Historical Information: Hypertension, DM type II   Additional pertinent review of systems negative.  Additional pertinent review of systems negative.   Current Outpatient Medications:    benzonatate  (TESSALON ) 100 MG capsule, Take 1 capsule (100 mg total) by mouth 3 (three) times daily as needed., Disp: 30 capsule, Rfl: 0   Cyanocobalamin  (VITAMIN B-12) 1000 MCG SUBL, Place 1 tablet (1,000 mcg total) under the tongue daily., Disp: 90 tablet, Rfl: 3   fexofenadine (ALLEGRA) 180 MG tablet, Take 180 mg by mouth daily as needed (allergies.). In the morning, Disp: , Rfl:    glucose blood (CONTOUR NEXT TEST) test strip, 100 each by Other route See admin instructions. Use as instructed, Disp: 100 strip, Rfl: 3   ibuprofen (ADVIL,MOTRIN) 200 MG tablet, Take 600 mg by mouth every 8 (eight) hours as needed (pain)., Disp: , Rfl:    levocetirizine (XYZAL) 5 MG tablet, Take 5 mg by mouth every evening. , Disp: , Rfl:    lisinopril  (ZESTRIL ) 10 MG tablet, TAKE 1 TABLET(10 MG) BY MOUTH DAILY FOR HIGH BLOOD PRESSURE, Disp: 90 tablet, Rfl: 0   LORazepam  (ATIVAN ) 0.5 MG tablet, 1-2 tabs 30 - 60 min prior to MRI. Do not drive with this medicine., Disp: 4 tablet, Rfl: 0   LORazepam  (ATIVAN ) 1 MG tablet, Take 1 tablet (1 mg total) by mouth as directed. 1  hour prior to your MRI appointment. DO NOT drive while taking this medication, Disp: 1 tablet, Rfl: 0   metFORMIN  (GLUCOPHAGE -XR) 500 MG 24 hr tablet, TAKE 4 TABLETS BY MOUTH DAILY, Disp: 360 tablet, Rfl: 3   Microlet Lancets MISC, USE TO CHECK BLOOD SUGAR ONE TO TWO TIMES DAILY, Disp: 100 each, Rfl: 3   montelukast (SINGULAIR) 10 MG tablet, Take 10 mg by mouth every evening. , Disp: , Rfl:    MOUNJARO  10 MG/0.5ML Pen, INJECT 10MG  INTO THE SKIN ONCE A WEEK, Disp: 6 mL, Rfl: 2   Multiple Vitamin (MULTIVITAMIN WITH MINERALS) TABS tablet, Take 1 tablet by mouth daily., Disp: , Rfl:    omeprazole (PRILOSEC) 20 MG capsule, Take 20 mg by mouth every evening. , Disp: , Rfl:    QNASL 80 MCG/ACT AERS, Place 2 sprays into the nose every evening. , Disp: , Rfl:    simvastatin  (ZOCOR ) 40 MG tablet, TAKE 1 TABLET(40 MG) BY MOUTH DAILY AT 6 PM, Disp: 90 tablet, Rfl: 1   Objective:     There were no vitals filed for this visit.    There is no height or weight on file to calculate BMI.    Physical Exam:    ***   Electronically signed by:  Nicole Gonzalez D.[facility] 7:45 AM 05/09/24

## 2024-05-10 ENCOUNTER — Ambulatory Visit (INDEPENDENT_AMBULATORY_CARE_PROVIDER_SITE_OTHER): Admitting: Sports Medicine

## 2024-05-10 VITALS — BP 126/82 | HR 82 | Ht 64.0 in | Wt 195.0 lb

## 2024-05-10 DIAGNOSIS — S76319D Strain of muscle, fascia and tendon of the posterior muscle group at thigh level, unspecified thigh, subsequent encounter: Secondary | ICD-10-CM

## 2024-05-10 DIAGNOSIS — M79604 Pain in right leg: Secondary | ICD-10-CM | POA: Diagnosis not present

## 2024-05-10 NOTE — Patient Instructions (Signed)
 PT referral  Continue HEP  Follow up in August when available

## 2024-05-16 ENCOUNTER — Ambulatory Visit: Admitting: Family Medicine

## 2024-05-16 ENCOUNTER — Encounter: Payer: Self-pay | Admitting: Family Medicine

## 2024-05-16 VITALS — BP 126/74 | HR 99 | Temp 98.0°F | Ht 64.0 in | Wt 196.0 lb

## 2024-05-16 DIAGNOSIS — R051 Acute cough: Secondary | ICD-10-CM | POA: Diagnosis not present

## 2024-05-16 DIAGNOSIS — R0982 Postnasal drip: Secondary | ICD-10-CM | POA: Diagnosis not present

## 2024-05-16 DIAGNOSIS — J309 Allergic rhinitis, unspecified: Secondary | ICD-10-CM

## 2024-05-16 MED ORDER — BENZONATATE 100 MG PO CAPS
100.0000 mg | ORAL_CAPSULE | Freq: Two times a day (BID) | ORAL | 0 refills | Status: DC | PRN
Start: 2024-05-16 — End: 2024-09-04

## 2024-05-16 NOTE — Progress Notes (Signed)
 Established Patient Office Visit   Subjective  Patient ID: Nicole Gonzalez, female    DOB: 1971-01-13  Age: 53 y.o. MRN: [id number]  Chief Complaint  Patient presents with   Medical Management of Chronic Issues    Cough started 4 days ago, nasal drainage, headache and pressure      Pt is a 53 yo female followed by Dr. Ethel Henry who was seen for acute illness.  Pt with nasal drainage, dry cough, intermittent HA, ST, and achy feeling x 4 days.  Pt has also had some nausea.  Denies ear pain/pressure, diarrhea, fever.  Tried increasing fluids, steam from shower, warm compress, Olpatdine nasal spray, singulair, xyzal, and Qnasal. Cough gtts generally make her symptoms worse.    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   Hyperlipidemia 03/27/2015   Family history of breast cancer in female 03/27/2015   Rhinitis, allergic 10/16/2014   Left maxillary sinusitis 10/16/2014   Prediabetes 09/24/2014   Plantar fasciitis 10/22/2013   Abdominal swelling, mass, or lump felt by patient 03/26/2013   Leg length discrepancy 03/22/2013   Newly diagnosed diabetes (HCC) 03/22/2013   Abdominal pain, chronic, right lower quadrant 03/22/2013   History of basal cell carcinoma excision    Visit for preventive health examination 03/15/2012   Hx of abdominal pain 03/15/2012   Anemia    OBESITY 07/15/2010   Allergic rhinitis 07/15/2010   HYPERGLYCEMIA 07/15/2010   Essential hypertension 06/02/2009   DIABETES MELLITUS, GESTATIONAL, HX OF 06/02/2009   KNEE PAIN 06/03/2008   HYPERLIPIDEMIA 10/09/2007   Esophagitis 10/09/2007   Hematuria 10/09/2007   HIDRADENITIS 10/09/2007   Past  Medical History:  Diagnosis Date   Anemia     better after hysterectomy   Cancer (HCC)    basil cell carcinoma/forehead   Diabetes mellitus without complication (HCC)    DIABETES MELLITUS, GESTATIONAL, HX OF 06/02/2009   Qualifier: Diagnosis of  By: Ethel Henry MD, Joaquim Muir    Diverticulosis    Family history of breast cancer    Family history of genetic disease carrier    father has CDKN2A and ATM mutation   Family history of melanoma    Family history of pancreatic cancer    Fracture of lower leg     fall 2011   GERD (gastroesophageal reflux disease)    History of basal cell carcinoma excision    History of gestational diabetes    x2   HTN (hypertension)    Hyperlipidemia    [id number] trx    Increased frequency of headaches 03/15/2012   Internal hemorrhoids    Obesity    Seizures (HCC)    as an infant d/t fever   Tubular adenoma of colon    UNSPECIFIED TACHYCARDIA 09/01/2010   Qualifier: Diagnosis of  By: Rodrick Clapper MD, Ammon Bales     Past Surgical History:  Procedure Laterality Date   ABDOMINAL HYSTERECTOMY  06/29/2010    question had abnormal cells on Path, fibroids   BASAL CELL CARCINOMA EXCISION     BREAST BIOPSY Right 2009   COLONOSCOPY WITH PROPOFOL  N/A 01/17/2019   Procedure: COLONOSCOPY  WITH PROPOFOL ;  Surgeon: Janel Medford, MD;  Location: [facility];  Service: Endoscopy;  Laterality: N/A;   ESOPHAGOGASTRODUODENOSCOPY (EGD) WITH PROPOFOL  N/A 01/17/2019   Procedure: ESOPHAGOGASTRODUODENOSCOPY (EGD) WITH PROPOFOL ;  Surgeon: Janel Medford, MD;  Location: [facility];  Service: Endoscopy;  Laterality: N/A;   EUS N/A 01/17/2019   Procedure: UPPER ENDOSCOPIC ULTRASOUND (EUS) RADIAL;  Surgeon: Janel Medford, MD;  Location: [facility];  Service: Endoscopy;  Laterality: N/A;   POLYPECTOMY  01/17/2019   Procedure: POLYPECTOMY;  Surgeon: Janel Medford, MD;  Location: [facility];  Service: Endoscopy;;   TONSILLECTOMY     Social History   Tobacco Use   Smoking status: Never    Smokeless tobacco: Never  Vaping Use   Vaping status: Never Used  Substance Use Topics   Alcohol use: No   Drug use: No   Family History  Problem Relation Age of Onset   Melanoma Father        x 3 times, first in 55's   Hypertension Father    Hyperlipidemia Father    Squamous cell carcinoma Father    Colon polyps Father    Heart disease Father    Pancreatic cancer Father 52       CDKN2A muation   Arthritis Brother         Psoriatic   Breast cancer Brother 16       ATM + mutation? or VUS   Diabetes Maternal Grandmother    Hyperlipidemia Maternal Grandmother    Heart disease Maternal Grandmother    Skin cancer Maternal Grandfather    Pancreatic cancer Paternal Grandmother 57       met liver   Heart disease Paternal Grandfather    Skin cancer Cousin    Breast cancer Other    Colon cancer Neg Hx    Stomach cancer Neg Hx    Esophageal cancer Neg Hx    Allergies  Allergen Reactions   Contrast Media [Iodinated Contrast Media] Other (See Comments)    ORAL and IV Fever and itchy mouth ORAL and IV Fever and itchy mouth   Adhesive [Tape]    Other     Adhesive, Metals   Polymyxin B -Trimethoprim  Swelling   Stadol [Butorphanol]     ROS Negative unless stated above    Objective:      BP 126/74 (BP Location: Left Arm, Patient Position: Sitting, Cuff Size: Normal)   Pulse 99   Temp 98 F (36.7 C) (Oral)   Ht 5\' 4"  (1.626 m)   Wt 196 lb (88.9 kg)   SpO2 96%   BMI 33.64 kg/m  BP Readings from Last 3 Encounters:  05/16/24 126/74  05/10/24 126/82  02/22/24 110/70   Wt Readings from Last 3 Encounters:  05/16/24 196 lb (88.9 kg)  05/10/24 195 lb (88.5 kg)  02/22/24 192 lb 6.4 oz (87.3 kg)      Physical Exam Constitutional:      General: She is not in acute distress.    Appearance: Normal appearance.  HENT:     Head: Normocephalic and atraumatic.     Left Ear: There is impacted cerumen.     Nose: Nose normal.     Mouth/Throat:     Mouth: Mucous  membranes are moist.     Pharynx: Postnasal drip present.  Cardiovascular:     Rate and Rhythm: Normal rate and regular rhythm.     Heart sounds: Normal heart sounds. No murmur heard.    No gallop.  Pulmonary:     Effort: Pulmonary effort is normal. No respiratory distress.     Breath sounds: Normal breath sounds. No wheezing, rhonchi or rales.  Skin:    General: Skin is warm and dry.  Neurological:     Mental Status: She is alert and oriented to person, place, and time.        07/26/2023    9:29 AM 01/31/2023    9:28 AM 10/25/2022    9:40 AM  Depression screen PHQ 2/9  Decreased Interest 0 0 0  Down, Depressed, Hopeless 0 0 0  PHQ - 2 Score 0 0 0  Altered sleeping  1 0  Tired, decreased energy  1 0  Change in appetite  1 1  Feeling bad or failure about yourself   0 0  Trouble concentrating  0 0  Moving slowly or fidgety/restless  0 0  Suicidal thoughts  0 0  PHQ-9 Score  3 1  Difficult doing work/chores  Not difficult at all       01/31/2023    9:28 AM 09/08/2021   12:49 PM  GAD 7 : Generalized Anxiety Score  Nervous, Anxious, on Edge 0 2  Control/stop worrying 0 1  Worry too much - different things 0 1  Trouble relaxing 0 0  Restless 0 0  Easily annoyed or irritable 0 0  Afraid - awful might happen 0 1  Total GAD 7 Score 0 5  Anxiety Difficulty Not difficult at all Somewhat difficult     No results found for any visits on 05/16/24.    Assessment & Plan:   Acute cough -     Benzonatate ; Take 1 capsule (100 mg total) by mouth 2 (two) times daily as needed for cough.  Dispense: 20 capsule; Refill: 0  Post-nasal drainage  Allergic rhinitis, unspecified seasonality, unspecified trigger  Acute URI symptoms likely 2/2 allergic cause.  Also consider viral etiology.  Continue supportive care with OTC cough medication for ppl with HTN and/or DM.  Tessalon  sent to pharmacy.  Given precautions.  F/u with pcp or allergist for continued sx.  Return if symptoms worsen  or fail to improve.   Viola Greulich, MD

## 2024-05-18 ENCOUNTER — Telehealth: Admitting: Physician Assistant

## 2024-05-18 DIAGNOSIS — B9689 Other specified bacterial agents as the cause of diseases classified elsewhere: Secondary | ICD-10-CM

## 2024-05-18 DIAGNOSIS — J019 Acute sinusitis, unspecified: Secondary | ICD-10-CM | POA: Diagnosis not present

## 2024-05-18 MED ORDER — AMOXICILLIN-POT CLAVULANATE 875-125 MG PO TABS: 1.0000 | ORAL_TABLET | Freq: Two times a day (BID) | ORAL | 0 refills | Status: DC

## 2024-05-18 NOTE — Progress Notes (Signed)
 I have spent 5 minutes in review of e-visit questionnaire, review and updating patient chart, medical decision making and response to patient.   Piedad Climes, PA-C

## 2024-05-18 NOTE — Progress Notes (Signed)

## 2024-05-20 ENCOUNTER — Other Ambulatory Visit: Payer: Self-pay | Admitting: Internal Medicine

## 2024-05-22 ENCOUNTER — Other Ambulatory Visit: Payer: Self-pay | Admitting: Internal Medicine

## 2024-05-31 ENCOUNTER — Telehealth: Admitting: Physician Assistant

## 2024-05-31 DIAGNOSIS — B379 Candidiasis, unspecified: Secondary | ICD-10-CM | POA: Diagnosis not present

## 2024-05-31 DIAGNOSIS — T3695XA Adverse effect of unspecified systemic antibiotic, initial encounter: Secondary | ICD-10-CM

## 2024-05-31 MED ORDER — FLUCONAZOLE 150 MG PO TABS: 150.0000 mg | ORAL_TABLET | ORAL | 0 refills | Status: DC | PRN

## 2024-05-31 NOTE — Progress Notes (Signed)

## 2024-06-10 NOTE — Therapy (Unsigned)
 OUTPATIENT PHYSICAL THERAPY LOWER EXTREMITY EVALUATION    9:28 AM, 06/12/24 Olivia Church, DPT Physical Therapy with [place]   Patient Name: Nicole Gonzalez MRN: [id number] DOB:[birth year], 53 y.o., female Today's Date: 06/12/2024  END OF SESSION:  PT End of Session - 06/12/24 0849     Visit Number 1    Number of Visits 12    Date for PT Re-Evaluation 09/04/24    Authorization Type BCBS VL 24/30 - [place]  NOT requried    Authorization - Visit Number 1    Authorization - Number of Visits 24    Progress Note Due on Visit 10    PT Start Time 0850    PT Stop Time 0921    PT Time Calculation (min) 31 min    Activity Tolerance Patient tolerated treatment well    Behavior During Therapy WFL for tasks assessed/performed           Past Medical History:  Diagnosis Date   Anemia     better after hysterectomy   Cancer (HCC)    basil cell carcinoma/forehead   Diabetes mellitus without complication (HCC)    DIABETES MELLITUS, GESTATIONAL, HX OF 06/02/2009   Qualifier: Diagnosis of  By: Charlett MD, Apolinar POUR    Diverticulosis    Family history of breast cancer    Family history of genetic disease carrier    father has CDKN2A and ATM mutation   Family history of melanoma    Family history of pancreatic cancer    Fracture of lower leg     fall 2011   GERD (gastroesophageal reflux disease)    History of basal cell carcinoma excision    History of gestational diabetes    x2   HTN (hypertension)    Hyperlipidemia    [id number] 043 trx    Increased frequency of headaches 03/15/2012   Internal hemorrhoids    Obesity    Seizures (HCC)    as an infant d/t fever   Tubular adenoma of colon    UNSPECIFIED TACHYCARDIA 09/01/2010   Qualifier: Diagnosis of  By: Domenica MD, Harlene     Past Surgical History:  Procedure Laterality Date   ABDOMINAL HYSTERECTOMY  06/29/2010    question had abnormal cells on Path, fibroids   BASAL CELL CARCINOMA EXCISION     BREAST BIOPSY Right 2009    COLONOSCOPY WITH PROPOFOL  N/A 01/17/2019   Procedure: COLONOSCOPY WITH PROPOFOL ;  Surgeon: Teressa Toribio SQUIBB, MD;  Location: [facility];  Service: Endoscopy;  Laterality: N/A;   ESOPHAGOGASTRODUODENOSCOPY (EGD) WITH PROPOFOL  N/A 01/17/2019   Procedure: ESOPHAGOGASTRODUODENOSCOPY (EGD) WITH PROPOFOL ;  Surgeon: Teressa Toribio SQUIBB, MD;  Location: [facility];  Service: Endoscopy;  Laterality: N/A;   EUS N/A 01/17/2019   Procedure: UPPER ENDOSCOPIC ULTRASOUND (EUS) RADIAL;  Surgeon: Teressa Toribio SQUIBB, MD;  Location: [facility];  Service: Endoscopy;  Laterality: N/A;   POLYPECTOMY  01/17/2019   Procedure: POLYPECTOMY;  Surgeon: Teressa Toribio SQUIBB, MD;  Location: [facility];  Service: Endoscopy;;   TONSILLECTOMY     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   Hyperlipidemia 03/27/2015  Family history of breast cancer in female 03/27/2015   Rhinitis, allergic 10/16/2014   Left maxillary sinusitis 10/16/2014   Prediabetes 09/24/2014   Plantar fasciitis 10/22/2013   Abdominal swelling, mass, or lump felt by patient 03/26/2013   Leg length discrepancy 03/22/2013   Newly diagnosed diabetes (HCC) 03/22/2013   Abdominal pain, chronic, right lower quadrant 03/22/2013   History of basal cell carcinoma excision    Visit for preventive health examination 03/15/2012   Hx of abdominal pain 03/15/2012   Anemia    OBESITY 07/15/2010   Allergic rhinitis 07/15/2010   HYPERGLYCEMIA 07/15/2010   Essential hypertension 06/02/2009   DIABETES MELLITUS, GESTATIONAL, HX OF 06/02/2009   KNEE PAIN 06/03/2008   HYPERLIPIDEMIA 10/09/2007   Esophagitis 10/09/2007   Hematuria 10/09/2007    HIDRADENITIS 10/09/2007    PCP: Charlett Howard MD   REFERRING PROVIDER: Leonce Katz, DO  REFERRING DIAG: [phone] (ICD-10-CM) - Partial hamstring tear, subsequent encounter M79.604 (ICD-10-CM) - Right leg pain  THERAPY DIAG:  Pain in right leg  Muscle weakness (generalized)  Rationale for Evaluation and Treatment: Rehabilitation  ONSET DATE: September 2024  SUBJECTIVE:   SUBJECTIVE STATEMENT: 06/12/2024 Patient reports she continues to have pain in her right hamstring. She tried the PRP injections and she couldn't do anything, no in a good place.  States she has been doing exercises and doing her recumbent bike which is not making things worse.   Driving is still bothering her. States she still has spasms in her hamstring. She has tried DN, exercise but MD want more or more specific exercise.   States she is going to talk to her MD about possible benefits of prednisone and making sure it doesn't mess with diabetes     PERTINENT HISTORY: DB PAIN:  Are you having pain? Yes: NPRS scale: 9/10 Pain location: R hamstring   Pain description: radiating down/spasm localized  Aggravating factors: sitting and driving for too long  Relieving factors: pressure   PRECAUTIONS: None  RED FLAGS: None   WEIGHT BEARING RESTRICTIONS: No  FALLS:  Has patient fallen in last 6 months? Yes - 10 days one ago where she slipped and her hips have been wonkey since then  LIVING ENVIRONMENT: Lives with: lives with their family Lives in: House/apartment Stairs: 2 STE then flight but can stay on main floor    OCCUPATION: works for insurance- sedentary job   PLOF: Independent, Independent with basic ADLs, Independent with gait, and Independent with transfers  PATIENT GOALS: take care of pain and improve ability to drive  NEXT MD VISIT:MD f/u later today   OBJECTIVE:  Note: Objective measures were completed at Evaluation unless otherwise noted.  DIAGNOSTIC FINDINGS:     CLINICAL DATA:  Right lower leg pain.  History of remote fracture.   EXAM: RIGHT TIBIA AND FIBULA - 2 VIEW   COMPARISON:  None Available.   FINDINGS: No evidence of acute fracture. No evidence of healing/healed fracture. The cortical margins are intact. No erosions, periostitis, or focal bone abnormality. Knee and ankle alignment are maintained. Unremarkable soft tissues.   IMPRESSION: Negative radiographs of the right tibia and fibula.    PATIENT SURVEYS:  Patient-specific activity functional scoring scheme (Point to one number):  0 represents "unable to perform." 10 represents "able to perform at prior level. 0 1 2 3 4 5 6 7 8 9  10 (Date and Score) Activity Initial  Activity Eval     Driving an hour  1    Working in garden (  squatting)  5    Job  requirements (sitting) - especially when stressed.  5    Additional Additional Total score = sum of the activity scores/number of activities Minimum detectable change (90%CI) for average score = 2 points Minimum detectable change (90%CI) for single activity score = 3 points PSFS developed by: Rosalee MYRTIS Marvis KYM Charlet CHRISTELLA., & Binkley, J. (1995). Assessing disability and change on individual  patients: a report of a patient specific measure. Physiotherapy [place], 47, [phone]. Reproduced with the permission of the authors  Score:EVAL: 11/3=3.66   COGNITION: Overall cognitive status: Within functional limits for tasks assessed     SENSATION: Not tested      LE Measurements Lower Extremity Right EVAL Left EVAL   A/PROM MMT A/PROM MMT  Hip Flexion WF 4+ WFL 4+  Hip Extension  4  4+  Hip Abduction      Hip Adduction      Hip Internal rotation [facility]   Hip External rotation [facility]  WFL   Knee Flexion WFL 4** WFL 4**  Knee Extension WFL 4+ WFL 4+  Ankle Dorsiflexion      Ankle Plantarflexion      Ankle Inversion      Ankle Eversion       (Blank rows = not tested) *  pain **tightness     PALPATION: increased resting tone in entire right pelvic region anterior and posterior and tenderness to palpation along right lateral hamstring and bilateral groin muscles                                                                                                                                  TREATMENT DATE:  06/12/2024  Therapeutic Exercise:  Aerobic: Supine: Prone:  Seated:  Standing: Neuromuscular Re-education:bridge with post tilt for glute activation - unable--> prone hip extension with knee straight for glute activation  unable -> improved with slight knee bend 2x10 B 5 holds  Manual Therapy: Therapeutic Activity: Self Care: Trigger Point Dry Needling:  Modalities:        PATIENT EDUCATION:  Education details: on current presentation, on HEP, on clinical outcomes score and POC Person educated: Patient Education method: Explanation, Demonstration, and Handouts Education comprehension: verbalized understanding   HOME EXERCISE PROGRAM: Already has established HEP from [facility]   ASSESSMENT:  CLINICAL IMPRESSION: 06/12/2024 Patient is known to this clinic for previous treatment of right hamstring pain.  Since last bout of PT she has undergone PRP injections which she does not feel significantly helped her symptoms.  Reports she has started back up on her exercises but the doctor wants more specific exercises to address her continued pain primarily when sitting and driving.  Patient presents with limitations in range of motion and strength.  She also has increased resting tone throughout her entire right lower trunk and pelvic region.  Limited ability for patient to perform anterior pelvic tilt which may  be contributing to continued pain as hamstring attaches to ischial tuberosity.  Patient would greatly benefit from skilled PT to address physical impairments and return her to optimal function.  OBJECTIVE IMPAIRMENTS: decreased activity  tolerance, decreased strength, increased fascial restrictions, increased muscle spasms, impaired flexibility, obesity, and pain.   ACTIVITY LIMITATIONS: sitting, locomotion level, and caring for others  PARTICIPATION LIMITATIONS: driving, shopping, community activity, and occupation  PERSONAL FACTORS: Age, Fitness, Past/current experiences, Profession, and Time since onset of injury/illness/exacerbation are also affecting patient's functional outcome.   REHAB POTENTIAL: Good  CLINICAL DECISION MAKING: Stable/uncomplicated  EVALUATION COMPLEXITY: Low   GOALS: Goals reviewed with patient? yes  SHORT TERM GOALS: Target date: 07/24/2024   Patient will be independent in self management strategies to improve quality of life and functional outcomes. Baseline: New Program Goal status: INITIAL  2.  Patient will report at least 50% improvement in overall symptoms and/or function to demonstrate improved functional mobility Baseline: 0% better Goal status: INITIAL  3.  Patient will demonstrate improved glute activation with bridge (report feeling it in glutes with this motion.) Baseline:  feels it in knee and calf) Goal status: INITIAL      LONG TERM GOALS: Target date: 09/04/2024     Patient will report at least 75% improvement in overall symptoms and/or function to demonstrate improved functional mobility Baseline: 0% better Goal status: INITIAL  2.  Patient will score at least 2 points higher on PSFS average to demonstrate change in overall function. Baseline: see above Goal status: INITIAL  3.  Patient will be able to sit in car for up to an hour without excruciating pain.  Baseline: unable Goal status: INITIAL       PLAN:  PT FREQUENCY: 1-2x/week for a total of 12 visits  PT DURATION: 12 weeks  PLANNED INTERVENTIONS: 97110-Therapeutic exercises, 97530- Therapeutic activity, 97112- Neuromuscular re-education, [phone]- Self Care, 02859- Manual therapy, [phone]- Gait  training, [phone]- Orthotic Fit/training, [phone]- Canalith repositioning, [id number]- Aquatic Therapy, 97014- Electrical stimulation (unattended), [phone]- Ionotophoresis 4mg /ml Dexamethasone, Patient/Family education, Balance training, Stair training, Taping, Dry Needling, Joint mobilization, Joint manipulation, Spinal manipulation, Spinal mobilization, Cryotherapy, and Moist heat   PLAN FOR NEXT SESSION: focus on proper muscle activation - glutes are underactive and calf/hamstring can overactive at time, possible TMR   Focus on anterior pelvic tilt - patient primarily uses lumbar spine  9:28 AM, 06/12/24 Olivia Church, DPT Physical Therapy with [place]

## 2024-06-11 ENCOUNTER — Ambulatory Visit: Admitting: Internal Medicine

## 2024-06-12 ENCOUNTER — Encounter: Payer: Self-pay | Admitting: Internal Medicine

## 2024-06-12 ENCOUNTER — Telehealth (INDEPENDENT_AMBULATORY_CARE_PROVIDER_SITE_OTHER): Admitting: Internal Medicine

## 2024-06-12 ENCOUNTER — Ambulatory Visit: Admitting: Physical Therapy

## 2024-06-12 ENCOUNTER — Encounter: Payer: Self-pay | Admitting: Physical Therapy

## 2024-06-12 DIAGNOSIS — S76319S Strain of muscle, fascia and tendon of the posterior muscle group at thigh level, unspecified thigh, sequela: Secondary | ICD-10-CM

## 2024-06-12 DIAGNOSIS — E1165 Type 2 diabetes mellitus with hyperglycemia: Secondary | ICD-10-CM | POA: Diagnosis not present

## 2024-06-12 DIAGNOSIS — S76311D Strain of muscle, fascia and tendon of the posterior muscle group at thigh level, right thigh, subsequent encounter: Secondary | ICD-10-CM | POA: Diagnosis not present

## 2024-06-12 DIAGNOSIS — Z79899 Other long term (current) drug therapy: Secondary | ICD-10-CM

## 2024-06-12 DIAGNOSIS — M79604 Pain in right leg: Secondary | ICD-10-CM | POA: Diagnosis not present

## 2024-06-12 DIAGNOSIS — M6281 Muscle weakness (generalized): Secondary | ICD-10-CM

## 2024-06-12 NOTE — Progress Notes (Signed)
 Virtual Visit via Video Note  I connected with Nicole Gonzalez on 06/12/24 at  4:00 PM EDT by a video enabled telemedicine application and verified that I am speaking with the correct person using two identifiers. Location patient: home Location provider:work office Persons participating in the virtual visit: patient, provider   Patient aware  of the limitations of evaluation and management by telemedicine and  availability of in person appointments. and agreed to proceed.   HPI: Nicole Gonzalez presents for video visit   Partial Tear hamstring  since  last year per mri and tried other .   Modalities and next offer is to take systemic steroid prednisone but hs has some reservation cause of her dm condition and wants to discuss.   Course would be less that 2 weeks felt .  No specific injury but flares when trying to drive in any vehicle.   Bg has been in control on  mounjaro  10 per week at this time Has fu in August.  ROS: See pertinent positives and negatives per HPI.  Past Medical History:  Diagnosis Date   Anemia     better after hysterectomy   Cancer (HCC)    basil cell carcinoma/forehead   Diabetes mellitus without complication (HCC)    DIABETES MELLITUS, GESTATIONAL, HX OF 06/02/2009   Qualifier: Diagnosis of  By: Charlett MD, Apolinar POUR    Diverticulosis    Family history of breast cancer    Family history of genetic disease carrier    father has CDKN2A and ATM mutation   Family history of melanoma    Family history of pancreatic cancer    Fracture of lower leg     fall 2011   GERD (gastroesophageal reflux disease)    History of basal cell carcinoma excision    History of gestational diabetes    x2   HTN (hypertension)    Hyperlipidemia    [id number] 043 trx    Increased frequency of headaches 03/15/2012   Internal hemorrhoids    Obesity    Seizures (HCC)    as an infant d/t fever   Tubular adenoma of colon    UNSPECIFIED TACHYCARDIA 09/01/2010   Qualifier: Diagnosis of   By: Domenica MD, Harlene      Past Surgical History:  Procedure Laterality Date   ABDOMINAL HYSTERECTOMY  06/29/2010    question had abnormal cells on Path, fibroids   BASAL CELL CARCINOMA EXCISION     BREAST BIOPSY Right 2009   COLONOSCOPY WITH PROPOFOL  N/A 01/17/2019   Procedure: COLONOSCOPY WITH PROPOFOL ;  Surgeon: Teressa Toribio SQUIBB, MD;  Location: [facility];  Service: Endoscopy;  Laterality: N/A;   ESOPHAGOGASTRODUODENOSCOPY (EGD) WITH PROPOFOL  N/A 01/17/2019   Procedure: ESOPHAGOGASTRODUODENOSCOPY (EGD) WITH PROPOFOL ;  Surgeon: Teressa Toribio SQUIBB, MD;  Location: [facility];  Service: Endoscopy;  Laterality: N/A;   EUS N/A 01/17/2019   Procedure: UPPER ENDOSCOPIC ULTRASOUND (EUS) RADIAL;  Surgeon: Teressa Toribio SQUIBB, MD;  Location: [facility];  Service: Endoscopy;  Laterality: N/A;   POLYPECTOMY  01/17/2019   Procedure: POLYPECTOMY;  Surgeon: Teressa Toribio SQUIBB, MD;  Location: [facility];  Service: Endoscopy;;   TONSILLECTOMY      Family History  Problem Relation Age of Onset   Melanoma Father        x 3 times, first in 51's   Hypertension Father    Hyperlipidemia Father    Squamous cell carcinoma Father    Colon polyps Father    Heart disease Father  Pancreatic cancer Father 82       CDKN2A muation   Arthritis Brother         Psoriatic   Breast cancer Brother 33       ATM + mutation? or VUS   Diabetes Maternal Grandmother    Hyperlipidemia Maternal Grandmother    Heart disease Maternal Grandmother    Skin cancer Maternal Grandfather    Pancreatic cancer Paternal Grandmother 38       met liver   Heart disease Paternal Grandfather    Skin cancer Cousin    Breast cancer Other    Colon cancer Neg Hx    Stomach cancer Neg Hx    Esophageal cancer Neg Hx     Social History   Tobacco Use   Smoking status: Never   Smokeless tobacco: Never  Vaping Use   Vaping status: Never Used  Substance Use Topics   Alcohol use: No   Drug use: No      Current Outpatient  Medications:    benzonatate  (TESSALON ) 100 MG capsule, Take 1 capsule (100 mg total) by mouth 2 (two) times daily as needed for cough., Disp: 20 capsule, Rfl: 0   Cyanocobalamin  (VITAMIN B-12) 1000 MCG SUBL, Place 1 tablet (1,000 mcg total) under the tongue daily., Disp: 90 tablet, Rfl: 3   fluconazole  (DIFLUCAN ) 150 MG tablet, Take 1 tablet (150 mg total) by mouth every 3 (three) days as needed., Disp: 2 tablet, Rfl: 0   glucose blood (CONTOUR NEXT TEST) test strip, 100 each by Other route See admin instructions. Use as instructed, Disp: 100 strip, Rfl: 3   ibuprofen (ADVIL,MOTRIN) 200 MG tablet, Take 600 mg by mouth every 8 (eight) hours as needed (pain)., Disp: , Rfl:    levocetirizine (XYZAL) 5 MG tablet, Take 5 mg by mouth every evening. , Disp: , Rfl:    lisinopril  (ZESTRIL ) 10 MG tablet, TAKE 1 TABLET(10 MG) BY MOUTH DAILY FOR HIGH BLOOD PRESSURE, Disp: 90 tablet, Rfl: 0   LORazepam  (ATIVAN ) 0.5 MG tablet, 1-2 tabs 30 - 60 min prior to MRI. Do not drive with this medicine., Disp: 4 tablet, Rfl: 0   LORazepam  (ATIVAN ) 1 MG tablet, Take 1 tablet (1 mg total) by mouth as directed. 1 hour prior to your MRI appointment. DO NOT drive while taking this medication, Disp: 1 tablet, Rfl: 0   metFORMIN  (GLUCOPHAGE -XR) 500 MG 24 hr tablet, TAKE 4 TABLETS BY MOUTH DAILY, Disp: 360 tablet, Rfl: 3   Microlet Lancets MISC, USE TO CHECK BLOOD SUGAR ONE TO TWO TIMES DAILY, Disp: 100 each, Rfl: 3   montelukast (SINGULAIR) 10 MG tablet, Take 10 mg by mouth every evening. , Disp: , Rfl:    MOUNJARO  10 MG/0.5ML Pen, INJECT 10MG  INTO THE SKIN ONCE A WEEK, Disp: 6 mL, Rfl: 2   Multiple Vitamin (MULTIVITAMIN WITH MINERALS) TABS tablet, Take 1 tablet by mouth daily., Disp: , Rfl:    omeprazole (PRILOSEC) 20 MG capsule, Take 20 mg by mouth every evening. , Disp: , Rfl:    QNASL 80 MCG/ACT AERS, Place 2 sprays into the nose every evening. , Disp: , Rfl:    simvastatin  (ZOCOR ) 40 MG tablet, TAKE 1 TABLET(40 MG) BY MOUTH  DAILY AT 6 PM, Disp: 90 tablet, Rfl: 1   amoxicillin -clavulanate (AUGMENTIN ) 875-125 MG tablet, Take 1 tablet by mouth 2 (two) times daily. (Patient not taking: Reported on 06/12/2024), Disp: 14 tablet, Rfl: 0  EXAM: BP Readings from Last 3 Encounters:  05/16/24  126/74  05/10/24 126/82  02/22/24 110/70    VITALS per patient if applicable:  GENERAL: alert, oriented, appears well and in no acute distress  HEENT: atraumatic, conjunttiva clear, no obvious abnormalities on inspection of external nose and ears  NECK: normal movements of the head and neck  LUNGS: on inspection no signs of respiratory distress, breathing rate appears normal, no obvious gross SOB, gasping or wheezing  CV: no obvious cyanosis  PSYCH/NEURO: pleasant and cooperative, no obvious depression or anxiety, speech and thought processing grossly intact Lab Results  Component Value Date   WBC 7.4 07/26/2023   HGB 13.6 07/26/2023   HCT 41.5 07/26/2023   PLT 252.0 07/26/2023   GLUCOSE 83 07/26/2023   CHOL 150 07/26/2023   TRIG 73.0 07/26/2023   HDL 51.90 07/26/2023   LDLDIRECT 172.4 10/02/2007   LDLCALC 83 07/26/2023   ALT 23 07/26/2023   AST 20 07/26/2023   NA 136 07/26/2023   K 4.0 07/26/2023   CL 100 07/26/2023   CREATININE 0.59 07/26/2023   BUN 15 07/26/2023   CO2 27 07/26/2023   TSH 1.65 07/26/2023   HGBA1C 5.5 01/16/2024   MICROALBUR 2.4 08/26/2020    ASSESSMENT AND PLAN:  Discussed the following assessment and plan:    ICD-10-CM   1. Partial hamstring tear, unspecified laterality, sequela  S76.319S     2. Type 2 diabetes mellitus with hyperglycemia, without long-term current use of insulin (HCC)  E11.65     3. Medication management  Z79.899      Dsic  short term steroid should be manageable   to monitor wheil on and if bg well over 200 and remain we can add   medication to cover such as  a SU if needed    Agree avoid  steroid injection into tendon to avoid  long term adverse effects.  Send  in [place] and call if bg too out of control  on steroid rx  Consdier in dose mounjaro  at fu if needed for better weight management  Counseled.   Expectant management and discussion of plan and treatment with opportunity to ask questions and all were answered. The patient agreed with the plan and demonstrated an understanding of the instructions.   Advised to call back or seek an in-person evaluation if worsening  or having  further concerns  in interim. Return for when planned.    Apolinar Eastern, MD

## 2024-06-17 ENCOUNTER — Telehealth: Payer: Self-pay | Admitting: Internal Medicine

## 2024-06-17 ENCOUNTER — Other Ambulatory Visit: Payer: Self-pay

## 2024-06-17 DIAGNOSIS — Z1331 Encounter for screening for depression: Secondary | ICD-10-CM | POA: Diagnosis not present

## 2024-06-17 DIAGNOSIS — Z800 Family history of malignant neoplasm of digestive organs: Secondary | ICD-10-CM

## 2024-06-17 DIAGNOSIS — Z01419 Encounter for gynecological examination (general) (routine) without abnormal findings: Secondary | ICD-10-CM | POA: Diagnosis not present

## 2024-06-17 NOTE — Telephone Encounter (Signed)
 EUS has been set up for 09/04/24 at 945 am at [facility] with GM.    EUS scheduled, pt instructed and medications reviewed.  Patient instructions mailed to home.  Patient to call with any questions or concerns.

## 2024-06-17 NOTE — Telephone Encounter (Signed)
 Inbound call from patient, would like to schedule EUS with Dr. Wilhelmenia per Recall.

## 2024-06-20 ENCOUNTER — Other Ambulatory Visit: Payer: Self-pay | Admitting: Obstetrics & Gynecology

## 2024-06-20 DIAGNOSIS — Z1501 Genetic susceptibility to malignant neoplasm of breast: Secondary | ICD-10-CM

## 2024-07-07 ENCOUNTER — Other Ambulatory Visit: Payer: Self-pay | Admitting: Internal Medicine

## 2024-07-18 NOTE — Progress Notes (Unsigned)
 [name] D.[facility] [address] [place] [address] Phone: [phone]   Assessment and Plan:     There are no diagnoses linked to this encounter.  ***   Pertinent previous records reviewed include ***    Follow Up: ***     Subjective:   I, Charidy Cappelletti, am serving as a Neurosurgeon for Doctor [employer]  Chief Complaint: Continued right posterior hip pain   HPI:    03/08/24 Office visit was transition to video visit due to MRI not being read at patient's scheduled MRI time.  Patient still is experiencing posterior right hip pain.   03/28/2024 PRP   05/10/2024 Patient states she didn't have any real improvement   07/19/2024 Patient states   Relevant Historical Information: Hypertension, DM type II   Additional pertinent review of systems negative.    Additional pertinent review of systems negative.   Current Outpatient Medications:    amoxicillin -clavulanate (AUGMENTIN ) 875-125 MG tablet, Take 1 tablet by mouth 2 (two) times daily. (Patient not taking: Reported on 06/12/2024), Disp: 14 tablet, Rfl: 0   benzonatate  (TESSALON ) 100 MG capsule, Take 1 capsule (100 mg total) by mouth 2 (two) times daily as needed for cough., Disp: 20 capsule, Rfl: 0   Cyanocobalamin  (VITAMIN B-12) 1000 MCG SUBL, Place 1 tablet (1,000 mcg total) under the tongue daily., Disp: 90 tablet, Rfl: 3   fluconazole  (DIFLUCAN ) 150 MG tablet, Take 1 tablet (150 mg total) by mouth every 3 (three) days as needed., Disp: 2 tablet, Rfl: 0   glucose blood (CONTOUR NEXT TEST) test strip, 100 each by Other route See admin instructions. Use as instructed, Disp: 100 strip, Rfl: 3   ibuprofen (ADVIL,MOTRIN) 200 MG tablet, Take 600 mg by mouth every 8 (eight) hours as needed (pain)., Disp: , Rfl:    levocetirizine (XYZAL) 5 MG tablet, Take 5 mg by mouth every evening. , Disp: , Rfl:    lisinopril  (ZESTRIL ) 10 MG tablet, TAKE 1 TABLET(10 MG) BY MOUTH DAILY FOR  HIGH BLOOD PRESSURE, Disp: 90 tablet, Rfl: 0   LORazepam  (ATIVAN ) 0.5 MG tablet, 1-2 tabs 30 - 60 min prior to MRI. Do not drive with this medicine., Disp: 4 tablet, Rfl: 0   LORazepam  (ATIVAN ) 1 MG tablet, Take 1 tablet (1 mg total) by mouth as directed. 1 hour prior to your MRI appointment. DO NOT drive while taking this medication, Disp: 1 tablet, Rfl: 0   metFORMIN  (GLUCOPHAGE -XR) 500 MG 24 hr tablet, TAKE 4 TABLETS BY MOUTH DAILY, Disp: 360 tablet, Rfl: 3   Microlet Lancets MISC, USE TO CHECK BLOOD SUGAR ONE TO TWO TIMES DAILY, Disp: 100 each, Rfl: 3   montelukast (SINGULAIR) 10 MG tablet, Take 10 mg by mouth every evening. , Disp: , Rfl:    MOUNJARO  10 MG/0.5ML Pen, INJECT 10MG  INTO THE SKIN ONCE A WEEK, Disp: 6 mL, Rfl: 2   Multiple Vitamin (MULTIVITAMIN WITH MINERALS) TABS tablet, Take 1 tablet by mouth daily., Disp: , Rfl:    omeprazole (PRILOSEC) 20 MG capsule, Take 20 mg by mouth every evening. , Disp: , Rfl:    QNASL 80 MCG/ACT AERS, Place 2 sprays into the nose every evening. , Disp: , Rfl:    simvastatin  (ZOCOR ) 40 MG tablet, TAKE 1 TABLET(40 MG) BY MOUTH DAILY AT 6 PM, Disp: 90 tablet, Rfl: 1   Objective:     There were no vitals filed for this visit.    There is no  height or weight on file to calculate BMI.    Physical Exam:    ***   Electronically signed by:  Odis Mace D[facility] 3:08 PM 07/18/24

## 2024-07-19 ENCOUNTER — Ambulatory Visit: Admitting: Sports Medicine

## 2024-07-19 ENCOUNTER — Other Ambulatory Visit

## 2024-07-19 ENCOUNTER — Ambulatory Visit
Admission: RE | Admit: 2024-07-19 | Discharge: 2024-07-19 | Disposition: A | Discharge status: Home / Self Care | Source: Ambulatory Visit | Attending: Obstetrics & Gynecology | Admitting: Obstetrics & Gynecology

## 2024-07-19 VITALS — BP 130/84 | HR 87 | Ht 64.0 in | Wt 192.0 lb

## 2024-07-19 DIAGNOSIS — Z1239 Encounter for other screening for malignant neoplasm of breast: Secondary | ICD-10-CM | POA: Diagnosis not present

## 2024-07-19 DIAGNOSIS — Z1501 Genetic susceptibility to malignant neoplasm of breast: Secondary | ICD-10-CM

## 2024-07-19 DIAGNOSIS — S76319D Strain of muscle, fascia and tendon of the posterior muscle group at thigh level, unspecified thigh, subsequent encounter: Secondary | ICD-10-CM

## 2024-07-19 DIAGNOSIS — M79604 Pain in right leg: Secondary | ICD-10-CM | POA: Diagnosis not present

## 2024-07-19 MED ORDER — GADOPICLENOL 0.5 MMOL/ML IV SOLN
9.0000 mL | Freq: Once | INTRAVENOUS | Status: AC | PRN
  Administered 2024-07-19: 9 mL via INTRAVENOUS

## 2024-07-19 MED ORDER — METHYLPREDNISOLONE 4 MG PO TBPK: ORAL_TABLET | ORAL | 0 refills | Status: DC

## 2024-07-19 NOTE — Patient Instructions (Signed)
 Prednisone dos pak  Restart PT   Continue HEP  4 week follow up

## 2024-07-22 ENCOUNTER — Ambulatory Visit: Admitting: Sports Medicine

## 2024-07-30 ENCOUNTER — Ambulatory Visit (INDEPENDENT_AMBULATORY_CARE_PROVIDER_SITE_OTHER): Payer: BC Managed Care – PPO | Admitting: Internal Medicine

## 2024-07-30 ENCOUNTER — Encounter: Payer: Self-pay | Admitting: Internal Medicine

## 2024-07-30 VITALS — BP 120/88 | HR 92 | Temp 97.7°F | Ht 63.2 in | Wt 187.6 lb

## 2024-07-30 DIAGNOSIS — I10 Essential (primary) hypertension: Secondary | ICD-10-CM | POA: Diagnosis not present

## 2024-07-30 DIAGNOSIS — Z1501 Genetic susceptibility to malignant neoplasm of breast: Secondary | ICD-10-CM

## 2024-07-30 DIAGNOSIS — Z79899 Other long term (current) drug therapy: Secondary | ICD-10-CM

## 2024-07-30 DIAGNOSIS — E1165 Type 2 diabetes mellitus with hyperglycemia: Secondary | ICD-10-CM | POA: Diagnosis not present

## 2024-07-30 DIAGNOSIS — Z0000 Encounter for general adult medical examination without abnormal findings: Secondary | ICD-10-CM

## 2024-07-30 DIAGNOSIS — E785 Hyperlipidemia, unspecified: Secondary | ICD-10-CM

## 2024-07-30 DIAGNOSIS — Z7984 Long term (current) use of oral hypoglycemic drugs: Secondary | ICD-10-CM

## 2024-07-30 DIAGNOSIS — Z1509 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: Secondary | ICD-10-CM

## 2024-07-30 DIAGNOSIS — Z7985 Long-term (current) use of injectable non-insulin antidiabetic drugs: Secondary | ICD-10-CM

## 2024-07-30 LAB — CBC WITH DIFFERENTIAL/PLATELET
Basophils Absolute: 0 K/uL (ref 0.0–0.1)
Basophils Relative: 0.5 % (ref 0.0–3.0)
Eosinophils Absolute: 0.1 K/uL (ref 0.0–0.7)
Eosinophils Relative: 1.6 % (ref 0.0–5.0)
HCT: 43.1 % (ref 36.0–46.0)
Hemoglobin: 14.1 g/dL (ref 12.0–15.0)
Lymphocytes Relative: 33.5 % (ref 12.0–46.0)
Lymphs Abs: 2.3 K/uL (ref 0.7–4.0)
MCHC: 32.7 g/dL (ref 30.0–36.0)
MCV: 87.9 fl (ref 78.0–100.0)
Monocytes Absolute: 0.5 K/uL (ref 0.1–1.0)
Monocytes Relative: 7.3 % (ref 3.0–12.0)
Neutro Abs: 4 K/uL (ref 1.4–7.7)
Neutrophils Relative %: 57.1 % (ref 43.0–77.0)
Platelets: 241 K/uL (ref 150.0–400.0)
RBC: 4.9 Mil/uL (ref 3.87–5.11)
RDW: 13.1 % (ref 11.5–15.5)
WBC: 6.9 K/uL (ref 4.0–10.5)

## 2024-07-30 LAB — VITAMIN B12: Vitamin B-12: 181 pg/mL — ABNORMAL LOW (ref 211–911)

## 2024-07-30 LAB — HEPATIC FUNCTION PANEL
ALT: 31 U/L (ref 0–35)
AST: 19 U/L (ref 0–37)
Albumin: 4.6 g/dL (ref 3.5–5.2)
Alkaline Phosphatase: 51 U/L (ref 39–117)
Bilirubin, Direct: 0.2 mg/dL (ref 0.0–0.3)
Total Bilirubin: 0.8 mg/dL (ref 0.2–1.2)
Total Protein: 7.2 g/dL (ref 6.0–8.3)

## 2024-07-30 LAB — MICROALBUMIN / CREATININE URINE RATIO
Creatinine,U: 62.2 mg/dL
Microalb Creat Ratio: UNDETERMINED mg/g (ref 0.0–30.0)
Microalb, Ur: <0.7 mg/dL

## 2024-07-30 LAB — BASIC METABOLIC PANEL WITH GFR
BUN: 17 mg/dL (ref 6–23)
CO2: 27 meq/L (ref 19–32)
Calcium: 9.5 mg/dL (ref 8.4–10.5)
Chloride: 103 meq/L (ref 96–112)
Creatinine, Ser: 0.6 mg/dL (ref 0.40–1.20)
GFR: 101.78 mL/min (ref >60.00)
Glucose, Bld: 88 mg/dL (ref 70–99)
Potassium: 5 meq/L (ref 3.5–5.1)
Sodium: 138 meq/L (ref 135–145)

## 2024-07-30 LAB — TSH: TSH: 1.95 u[IU]/mL (ref 0.35–5.50)

## 2024-07-30 LAB — HEMOGLOBIN A1C: Hgb A1c MFr Bld: 6.2 % (ref 4.6–6.5)

## 2024-07-30 LAB — LIPID PANEL
Cholesterol: 176 mg/dL (ref 0–200)
HDL: 55.8 mg/dL (ref >39.00)
LDL Cholesterol: 106 mg/dL — ABNORMAL HIGH (ref 0–99)
NonHDL: 120.36
Total CHOL/HDL Ratio: 3
Triglycerides: 73 mg/dL (ref 0.0–149.0)
VLDL: 14.6 mg/dL (ref 0.0–40.0)

## 2024-07-30 MED ORDER — TIRZEPATIDE 12.5 MG/0.5ML ~~LOC~~ SOAJ
12.5000 mg | SUBCUTANEOUS | 3 refills | Status: AC
Start: 2024-07-30 — End: ?

## 2024-07-30 NOTE — Patient Instructions (Addendum)
 Good to see  you today  Exam is good. Inc dose of mounjaro  to 12.5 per week  Plan fu  visit in 3-4 months or as indicated   Goal bp is 120/80 rrange  if not in range at home we can increase lisinoprilto 20 per day  or change medication etc.

## 2024-07-30 NOTE — Progress Notes (Signed)
 Chief Complaint  Patient presents with   Annual Exam    Pt is fasting. Pt reports she had MRI breast order by dr. Barbette as brother has breast cancer.     HPI: Patient  Nicole Gonzalez  53 y.o. comes in today for Preventive Health Care visit   Obesity dm on 10 weekly mounjaro  doing ok  bg ok  HLD med no change  GI fam hx pancreatic other cancer with gene mutation  MS issues  r leg thigh tendon problem  issues when drives  a lot  r leg otherwise acitve  May have had palpitation on pred but ok.   Allergies ok.   Fam member  alluded to getting baseline  testing assess for dementia?   Health Maintenance  Topic Date Due   HIV Screening  Never done   Hepatitis C Screening  Never done   Diabetic kidney evaluation - Urine ACR  08/26/2021   Pneumococcal Vaccine: 50+ Years (3 of 3 - PCV20 or PCV21) 09/19/2022   Diabetic kidney evaluation - eGFR measurement  07/25/2024   HEMOGLOBIN A1C  07/15/2024   COVID-19 Vaccine (4 - 2024-25 season) 08/15/2024 (Originally 08/13/2023)   Cervical Cancer Screening (HPV/Pap Cotest)  10/30/2024 (Originally 03/26/2016)   INFLUENZA VACCINE  03/11/2025 (Originally 07/12/2024)   OPHTHALMOLOGY EXAM  02/22/2025   FOOT EXAM  07/30/2025   MAMMOGRAM  01/11/2026   DTaP/Tdap/Td (3 - Td or Tdap) 07/18/2029   Colonoscopy  11/21/2030   Hepatitis B Vaccines 19-59 Average Risk  Completed   Zoster Vaccines- Shingrix  Completed   HPV VACCINES  Aged Out   Meningococcal B Vaccine  Aged Out   Pneumococcal Vaccine  Discontinued   Health Maintenance Review LIFESTYLE:  Exercise:   see sm   Tobacco/ETS: n Alcohol: n Sugar beverages: Sleep: 5 off and on  prednisone  slept better.  Drug use: no HH of   2  Work:  40 +       ROS:  GEN/ HEENT: No fever, significant weight changes sweats headaches vision problems hearing changes, CV/ PULM; No chest pain shortness of breath cough, syncope,edema  change in exercise tolerance. GI /GU: No adominal pain, vomiting, change in  bowel habits. No blood in the stool. No significant GU symptoms. SKIN/HEME: ,no acute skin rashes suspicious lesions or bleeding. No lymphadenopathy, nodules, masses.  NEURO/ PSYCH:  No neurologic signs such as weakness numbness. No depression anxiety. IMM/ Allergy: No unusual infections.  Allergy .   REST of 12 system review negative except as per HPI   Past Medical History:  Diagnosis Date   Anemia     better after hysterectomy   Cancer (HCC)    basil cell carcinoma/forehead   Diabetes mellitus without complication (HCC)    DIABETES MELLITUS, GESTATIONAL, HX OF 06/02/2009   Qualifier: Diagnosis of  By: Charlett MD, Apolinar POUR    Diverticulosis    Family history of breast cancer    Family history of genetic disease carrier    father has CDKN2A and ATM mutation   Family history of melanoma    Family history of pancreatic cancer    Fracture of lower leg     fall 2011   GERD (gastroesophageal reflux disease)    History of basal cell carcinoma excision    History of gestational diabetes    x2   HTN (hypertension)    Hyperlipidemia    [id number] trx    Increased frequency of headaches 03/15/2012  Internal hemorrhoids    Obesity    Seizures (HCC)    as an infant d/t fever   Tubular adenoma of colon    UNSPECIFIED TACHYCARDIA 09/01/2010   Qualifier: Diagnosis of  By: Domenica MD, Harlene      Past Surgical History:  Procedure Laterality Date   ABDOMINAL HYSTERECTOMY  06/29/2010    question had abnormal cells on Path, fibroids   BASAL CELL CARCINOMA EXCISION     BREAST BIOPSY Right 2009   COLONOSCOPY WITH PROPOFOL  N/A 01/17/2019   Procedure: COLONOSCOPY WITH PROPOFOL ;  Surgeon: Teressa Toribio SQUIBB, MD;  Location: [facility];  Service: Endoscopy;  Laterality: N/A;   ESOPHAGOGASTRODUODENOSCOPY (EGD) WITH PROPOFOL  N/A 01/17/2019   Procedure: ESOPHAGOGASTRODUODENOSCOPY (EGD) WITH PROPOFOL ;  Surgeon: Teressa Toribio SQUIBB, MD;  Location: [facility];  Service: Endoscopy;  Laterality: N/A;   EUS N/A  01/17/2019   Procedure: UPPER ENDOSCOPIC ULTRASOUND (EUS) RADIAL;  Surgeon: Teressa Toribio SQUIBB, MD;  Location: [facility];  Service: Endoscopy;  Laterality: N/A;   POLYPECTOMY  01/17/2019   Procedure: POLYPECTOMY;  Surgeon: Teressa Toribio SQUIBB, MD;  Location: [facility];  Service: Endoscopy;;   TONSILLECTOMY      Family History  Problem Relation Age of Onset   Melanoma Father        x 3 times, first in 40's   Hypertension Father    Hyperlipidemia Father    Squamous cell carcinoma Father    Colon polyps Father    Heart disease Father    Pancreatic cancer Father 31       CDKN2A muation   Arthritis Brother         Psoriatic   Breast cancer Brother 41       ATM + mutation? or VUS   Diabetes Maternal Grandmother    Hyperlipidemia Maternal Grandmother    Heart disease Maternal Grandmother    Skin cancer Maternal Grandfather    Pancreatic cancer Paternal Grandmother 82       met liver   Heart disease Paternal Grandfather    Skin cancer Cousin    Breast cancer Other    Colon cancer Neg Hx    Stomach cancer Neg Hx    Esophageal cancer Neg Hx     Social History   Socioeconomic History   Marital status: Married    Spouse name: Not on file   Number of children: 2   Years of education: Not on file   Highest education level: Not on file  Occupational History   Occupation: Government social research officer: [employer]  Tobacco Use   Smoking status: Never   Smokeless tobacco: Never  Vaping Use   Vaping status: Never Used  Substance and Sexual Activity   Alcohol use: No   Drug use: No   Sexual activity: Not on file  Other Topics Concern   Not on file  Social History Narrative    Household of 4 -2 children   Son   to college     no pets     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    Nonsmoker married   Social Drivers of Corporate investment banker Strain: Not on file  Food Insecurity: No Food Insecurity (07/26/2023)   Hunger Vital Sign     Worried About Running Out of Food in the Last Year: Never true    Ran Out of Food in the Last Year: Never true  Transportation Needs: Not on file  Physical Activity: Sufficiently Active (07/26/2023)   Exercise Vital Sign    Days of Exercise per Week: 7 days    Minutes of Exercise per Session: 30 min  Stress: Stress Concern Present (07/26/2023)   [employer] of Occupational Health - Occupational Stress Questionnaire    Feeling of Stress : To some extent  Social Connections: Socially Integrated (07/26/2023)   Social Connection and Isolation Panel    Frequency of Communication with Friends and Family: More than three times a week    Frequency of Social Gatherings with Friends and Family: More than three times a week    Attends Religious Services: More than 4 times per year    Active Member of [employer] or Organizations: Yes    Attends Engineer, structural: More than 4 times per year    Marital Status: Married    Outpatient Medications Prior to Visit  Medication Sig Dispense Refill   benzonatate  (TESSALON ) 100 MG capsule Take 1 capsule (100 mg total) by mouth 2 (two) times daily as needed for cough. 20 capsule 0   fluconazole  (DIFLUCAN ) 150 MG tablet Take 1 tablet (150 mg total) by mouth every 3 (three) days as needed. 2 tablet 0   glucose blood (CONTOUR NEXT TEST) test strip 100 each by Other route See admin instructions. Use as instructed 100 strip 3   ibuprofen (ADVIL,MOTRIN) 200 MG tablet Take 600 mg by mouth every 8 (eight) hours as needed (pain).     levocetirizine (XYZAL) 5 MG tablet Take 5 mg by mouth every evening.      lisinopril  (ZESTRIL ) 10 MG tablet TAKE 1 TABLET(10 MG) BY MOUTH DAILY FOR HIGH BLOOD PRESSURE 90 tablet 0   metFORMIN  (GLUCOPHAGE -XR) 500 MG 24 hr tablet TAKE 4 TABLETS BY MOUTH DAILY 360 tablet 3   Microlet Lancets MISC USE TO CHECK BLOOD SUGAR ONE TO TWO TIMES DAILY 100 each 3   montelukast (SINGULAIR) 10 MG tablet Take 10 mg by mouth every evening.       Multiple Vitamin (MULTIVITAMIN WITH MINERALS) TABS tablet Take 1 tablet by mouth daily.     omeprazole (PRILOSEC) 20 MG capsule Take 20 mg by mouth every evening.      QNASL 80 MCG/ACT AERS Place 2 sprays into the nose every evening.      simvastatin  (ZOCOR ) 40 MG tablet TAKE 1 TABLET(40 MG) BY MOUTH DAILY AT 6 PM 90 tablet 1   MOUNJARO  10 MG/0.5ML Pen INJECT 10MG  INTO THE SKIN ONCE A WEEK 6 mL 2   Cyanocobalamin  (VITAMIN B-12) 1000 MCG SUBL Place 1 tablet (1,000 mcg total) under the tongue daily. (Patient not taking: Reported on 07/30/2024) 90 tablet 3   LORazepam  (ATIVAN ) 0.5 MG tablet 1-2 tabs 30 - 60 min prior to MRI. Do not drive with this medicine. (Patient not taking: Reported on 07/30/2024) 4 tablet 0   LORazepam  (ATIVAN ) 1 MG tablet Take 1 tablet (1 mg total) by mouth as directed. 1 hour prior to your MRI appointment. DO NOT drive while taking this medication (Patient not taking: Reported on 07/30/2024) 1 tablet 0   methylPREDNISolone  (MEDROL  DOSEPAK) 4 MG TBPK tablet Take 6 tablets on day 1.  Take 5 tablets on day 2.  Take 4 tablets on day 3.  Take 3 tablets on day 4.  Take 2 tablets on day 5.  Take 1 tablet on day 6. (Patient not taking: Reported on 07/30/2024) 21 tablet 0   No facility-administered medications  prior to visit.     EXAM:  BP 120/88 (BP Location: Right Arm, Patient Position: Sitting, Cuff Size: Normal)   Pulse 92   Temp 97.7 F (36.5 C) (Oral)   Ht 5' 3.2 (1.605 m)   Wt 187 lb 9.6 oz (85.1 kg)   SpO2 96%   BMI 33.02 kg/m   Body mass index is 33.02 kg/m. Wt Readings from Last 3 Encounters:  07/30/24 187 lb 9.6 oz (85.1 kg)  07/19/24 192 lb (87.1 kg)  05/16/24 196 lb (88.9 kg)   BP Readings from Last 3 Encounters:  07/30/24 120/88  07/19/24 130/84  05/16/24 126/74    Physical Exam: Vital signs reviewed HZW:Uypd is a well-developed well-nourished alert cooperative    who appearsr stated age in no acute distress.  HEENT: normocephalic atraumatic , Eyes:  PERRL EOM's full, conjunctiva clear, Nares: paten,t no deformity discharge or tenderness., Ears: no deformity EAC's clear TMs with normal landmarks. Mouth: clear OP, no lesions, edema.  Moist mucous membranes. Dentition in adequate repair. NECK: supple without masses, thyromegaly or bruits. CHEST/PULM:  Clear to auscultation and percussion breath sounds equal no wheeze , rales or rhonchi. No chest wall deformities or tenderness. Breast: normal by inspection . No dimpling, discharge, masses, tenderness or discharge . CV: PMI is nondisplaced, S1 S2 no gallops, murmurs, rubs. Peripheral pulses are full without delay.No JVD .  ABDOMEN: Bowel sounds normal nontender  No guard or rebound, no hepato splenomegal no CVA tenderness.  Extremtities:  No clubbing cyanosis or edema, no acute joint swelling or redness no focal atrophy NEURO:  Oriented x3, cranial nerves 3-12 appear to be intact, no obvious focal weakness,gait within normal limits no abnormal reflexes or asymmetrical SKIN: No acute rashes normal turgor, color, no bruising or petechiae. PSYCH: Oriented, good eye contact, no obvious depression anxiety, cognition and judgment appear normal. LN: no cervical axillary adenopathy  Diabetic foot exam was performed with the following findings:   No deformities, ulcerations, or other skin breakdown Normal sensation of 10g monofilament Intact posterior tibialis and dorsalis pedis pulses    Diabetic Foot Exam - Simple   Simple Foot Form Diabetic Foot exam was performed with the following findings: Yes 07/30/2024 10:10 AM  Visual Inspection No deformities, no ulcerations, no other skin breakdown bilaterally: Yes Sensation Testing Intact to touch and monofilament testing bilaterally: Yes Pulse Check Posterior Tibialis and Dorsalis pulse intact bilaterally: Yes Comments     Lab Results  Component Value Date   WBC 7.4 07/26/2023   HGB 13.6 07/26/2023   HCT 41.5 07/26/2023   PLT 252.0 07/26/2023    GLUCOSE 83 07/26/2023   CHOL 150 07/26/2023   TRIG 73.0 07/26/2023   HDL 51.90 07/26/2023   LDLDIRECT 172.4 10/02/2007   LDLCALC 83 07/26/2023   ALT 23 07/26/2023   AST 20 07/26/2023   NA 136 07/26/2023   K 4.0 07/26/2023   CL 100 07/26/2023   CREATININE 0.59 07/26/2023   BUN 15 07/26/2023   CO2 27 07/26/2023   TSH 1.65 07/26/2023   HGBA1C 5.5 01/16/2024   MICROALBUR 2.4 08/26/2020    BP Readings from Last 3 Encounters:  07/30/24 120/88  07/19/24 130/84  05/16/24 126/74    Lab plan  reviewed with patient   ASSESSMENT AND PLAN:  Discussed the following assessment and plan:    ICD-10-CM   1. Visit for preventive health examination  Z00.00 Microalbumin / creatinine urine ratio    TSH    Lipid panel  Hepatic function panel    Hemoglobin A1c    CBC with Differential/Platelet    Basic metabolic panel    Vitamin B12    2. Type 2 diabetes mellitus with hyperglycemia, without long-term current use of insulin (HCC)  E11.65 Microalbumin / creatinine urine ratio    TSH    Lipid panel    Hepatic function panel    Hemoglobin A1c    CBC with Differential/Platelet    Basic metabolic panel    Vitamin B12    3. Essential hypertension  I10 Microalbumin / creatinine urine ratio    TSH    Lipid panel    Hepatic function panel    Hemoglobin A1c    CBC with Differential/Platelet    Basic metabolic panel    Vitamin B12    4. Medication management  Z79.899 Microalbumin / creatinine urine ratio    TSH    Lipid panel    Hepatic function panel    Hemoglobin A1c    CBC with Differential/Platelet    Basic metabolic panel    Vitamin B12    5. Hyperlipidemia, unspecified hyperlipidemia type  E78.5 Microalbumin / creatinine urine ratio    TSH    Lipid panel    Hepatic function panel    Hemoglobin A1c    CBC with Differential/Platelet    Basic metabolic panel    Vitamin B12    6. Monoallelic mutation of CDKN2A gene  Z15.09 Microalbumin / creatinine urine ratio    Z15.01 TSH    Lipid panel    Hepatic function panel    Hemoglobin A1c    CBC with Differential/Platelet    Basic metabolic panel    Vitamin B12    Dm ok no complications  inc mounjaro  trial to 12.5 to facilitate also weight loss since no se of current dose. Cont lsi  Bp today not at goal but has been low in past  check home readings  goal average 120/80 and below  Fasting lab today .  Return in about 4 months (around 11/29/2024).  Or as indicated   Patient Care Team: Vickey Boak, Apolinar POUR, MD as PCP - General lucienne Barbette Knock, MD as Attending Physician (Obstetrics and Gynecology) [facility], Tawni CROME, MD (Dermatology) Pyrtle, Gordy HERO, MD as Consulting Physician (Gastroenterology) Barbette Knock, MD as Consulting Physician (Obstetrics and Gynecology) Patient Instructions  Good to see  you today  Exam is good. Inc dose of mounjaro  to 12.5 per week  Plan fu  visit in 3-4 months or as indicated   Goal bp is 120/80 rrange  if not in range at home we can increase lisinoprilto 20 per day  or change medication etc.  Nicole Gonzalez K. Natashia Roseman M.D.

## 2024-07-31 ENCOUNTER — Ambulatory Visit: Payer: Self-pay | Admitting: Internal Medicine

## 2024-07-31 NOTE — Progress Notes (Signed)
 Ldl up some A1c 6.2 B12 low again   Get back on b12   daily for now   can try the regular  1000 mcg per day  if easier than the sublingual   We can follow up in future

## 2024-08-15 NOTE — Progress Notes (Unsigned)
 [name] D[facility] [address] [place] [address] Phone: [phone]   Assessment and Plan:     1. Partial hamstring tear, subsequent encounter (Primary) 2. Right leg pain 3. Hamstring tightness of right lower extremity 4. Chronic right-sided low back pain with right-sided sciatica -Chronic with exacerbation, subsequent visit - Recurrent flare and overall no improvement in posterior right hip pain.  Still most consistent with low-grade partial tearing of conjoined tendon as seen on right hip MRI on 03/02/2024, however atypical recovery timeframe with patient seeing no significant benefit despite 10+ months of conservative therapy, relative rest, HEP, physical therapy, PRP injection to conjoined tendon, NSAID course, prednisone course. - Differential has included lumbar radiculopathy which could explain failure to improve with targeted treatments of hamstring.  Recommend further evaluation of lumbar spine due to failure to improve despite >6 weeks of conservative therapy, pain >6/10, pain with day-to-day activities - Use Tylenol 500 to 1000 mg tablets 2-3 times a day for day-to-day pain relief -Continue HEP and physical therapy and dry needling for hamstring, low back -Discontinue prednisone Dosepak.  5. Claustrophobia - ativan  0.5mg  1-2 tab for MRI  Pertinent previous records reviewed include none   Follow Up: 5 days after MRI to review results and discuss treatment plan.  Could consider epidural CSI based on lumbar MRI.  Could consider hamstring tendon CSI versus shockwave therapy   Subjective:   I, Kiera Hussey, am serving as a Neurosurgeon for Doctor Morene Mace  Chief Complaint: Continued right posterior hip pain   HPI:    03/08/24 Office visit was transition to video visit due to MRI not being read at patient's scheduled MRI time.  Patient still is experiencing posterior right hip pain.   03/28/2024 PRP    05/10/2024 Patient states she didn't have any real improvement    07/19/2024 Patient states that she is the same . PCP is okay with prednisone and will start her on diabetes medicine   08/16/2024 Patient states she is the same. Last week she was in flare   Relevant Historical Information: Hypertension, DM type II    Additional pertinent review of systems negative.   Current Outpatient Medications:    benzonatate  (TESSALON ) 100 MG capsule, Take 1 capsule (100 mg total) by mouth 2 (two) times daily as needed for cough., Disp: 20 capsule, Rfl: 0   Cyanocobalamin  (VITAMIN B-12) 1000 MCG SUBL, Place 1 tablet (1,000 mcg total) under the tongue daily. (Patient not taking: Reported on 07/30/2024), Disp: 90 tablet, Rfl: 3   fluconazole  (DIFLUCAN ) 150 MG tablet, Take 1 tablet (150 mg total) by mouth every 3 (three) days as needed., Disp: 2 tablet, Rfl: 0   glucose blood (CONTOUR NEXT TEST) test strip, 100 each by Other route See admin instructions. Use as instructed, Disp: 100 strip, Rfl: 3   ibuprofen (ADVIL,MOTRIN) 200 MG tablet, Take 600 mg by mouth every 8 (eight) hours as needed (pain)., Disp: , Rfl:    levocetirizine (XYZAL) 5 MG tablet, Take 5 mg by mouth every evening. , Disp: , Rfl:    lisinopril  (ZESTRIL ) 10 MG tablet, TAKE 1 TABLET(10 MG) BY MOUTH DAILY FOR HIGH BLOOD PRESSURE, Disp: 90 tablet, Rfl: 0   LORazepam  (ATIVAN ) 0.5 MG tablet, 1-2 tabs 30 - 60 min prior to MRI. Do not drive with this medicine. (Patient not taking: Reported on 07/30/2024), Disp: 4 tablet, Rfl: 0   LORazepam  (ATIVAN ) 1 MG tablet, Take 1 tablet (1 mg total) by  mouth as directed. 1 hour prior to your MRI appointment. DO NOT drive while taking this medication (Patient not taking: Reported on 07/30/2024), Disp: 1 tablet, Rfl: 0   metFORMIN  (GLUCOPHAGE -XR) 500 MG 24 hr tablet, TAKE 4 TABLETS BY MOUTH DAILY, Disp: 360 tablet, Rfl: 3   methylPREDNISolone  (MEDROL  DOSEPAK) 4 MG TBPK tablet, Take 6 tablets on day 1.  Take 5  tablets on day 2.  Take 4 tablets on day 3.  Take 3 tablets on day 4.  Take 2 tablets on day 5.  Take 1 tablet on day 6. (Patient not taking: Reported on 07/30/2024), Disp: 21 tablet, Rfl: 0   Microlet Lancets MISC, USE TO CHECK BLOOD SUGAR ONE TO TWO TIMES DAILY, Disp: 100 each, Rfl: 3   montelukast (SINGULAIR) 10 MG tablet, Take 10 mg by mouth every evening. , Disp: , Rfl:    Multiple Vitamin (MULTIVITAMIN WITH MINERALS) TABS tablet, Take 1 tablet by mouth daily., Disp: , Rfl:    omeprazole (PRILOSEC) 20 MG capsule, Take 20 mg by mouth every evening. , Disp: , Rfl:    QNASL 80 MCG/ACT AERS, Place 2 sprays into the nose every evening. , Disp: , Rfl:    simvastatin  (ZOCOR ) 40 MG tablet, TAKE 1 TABLET(40 MG) BY MOUTH DAILY AT 6 PM, Disp: 90 tablet, Rfl: 1   tirzepatide  (MOUNJARO ) 12.5 MG/0.5ML Pen, Inject 12.5 mg into the skin once a week., Disp: 6 mL, Rfl: 3   Objective:     Vitals:   08/16/24 0816  Pulse: 88  SpO2: 97%  Weight: 182 lb (82.6 kg)  Height: 5' 3 (1.6 m)      Body mass index is 32.24 kg/m.    Physical Exam:    General: awake, alert, and oriented no acute distress, nontoxic Skin: no suspicious lesions or rashes Neuro:sensation intact distally with no deficits, normal muscle tone, no atrophy, strength 5/5 in all tested lower ext groups Psych: normal mood and affect, speech clear   Right hip: No deformity, swelling or wasting ROM Flexion 90, ext 30, IR 45, ER 45 TTP hamstring musculature NTTP over the hip flexors, greater trochanter, gluteal musculature, si joint, lumbar spine Negative log roll with FROM Negative FABER Negative FADIR Negative Piriformis test, though reproduced tension Negative trendelenberg Gait normal     Electronically signed by:  Odis Mace [facility] 8:38 AM 08/16/24

## 2024-08-16 ENCOUNTER — Ambulatory Visit: Admitting: Sports Medicine

## 2024-08-16 ENCOUNTER — Telehealth: Payer: Self-pay | Admitting: Sports Medicine

## 2024-08-16 VITALS — HR 88 | Ht 63.0 in | Wt 182.0 lb

## 2024-08-16 DIAGNOSIS — M6289 Other specified disorders of muscle: Secondary | ICD-10-CM

## 2024-08-16 DIAGNOSIS — F40240 Claustrophobia: Secondary | ICD-10-CM

## 2024-08-16 DIAGNOSIS — S76319D Strain of muscle, fascia and tendon of the posterior muscle group at thigh level, unspecified thigh, subsequent encounter: Secondary | ICD-10-CM

## 2024-08-16 DIAGNOSIS — M5441 Lumbago with sciatica, right side: Secondary | ICD-10-CM | POA: Diagnosis not present

## 2024-08-16 DIAGNOSIS — G8929 Other chronic pain: Secondary | ICD-10-CM

## 2024-08-16 DIAGNOSIS — M79604 Pain in right leg: Secondary | ICD-10-CM | POA: Diagnosis not present

## 2024-08-16 MED ORDER — LORAZEPAM 0.5 MG PO TABS: ORAL_TABLET | ORAL | 0 refills | Status: AC

## 2024-08-16 NOTE — Telephone Encounter (Signed)
 Pt has scheduled MRI for this Sunday, needs meds called in to help get through this procedure.   [facility]

## 2024-08-16 NOTE — Addendum Note (Signed)
 Addended by: Edith Lord on: 08/16/2024 02:43 PM   Modules accepted: Orders

## 2024-08-16 NOTE — Patient Instructions (Signed)
 MRI lumbar   PT referral   Follow pup 5-7 days after MRI to discuss results

## 2024-08-16 NOTE — Telephone Encounter (Signed)
 Meds sent

## 2024-08-18 ENCOUNTER — Ambulatory Visit (INDEPENDENT_AMBULATORY_CARE_PROVIDER_SITE_OTHER)

## 2024-08-18 DIAGNOSIS — M549 Dorsalgia, unspecified: Secondary | ICD-10-CM | POA: Diagnosis not present

## 2024-08-18 DIAGNOSIS — M545 Low back pain, unspecified: Secondary | ICD-10-CM | POA: Diagnosis not present

## 2024-08-18 DIAGNOSIS — N281 Cyst of kidney, acquired: Secondary | ICD-10-CM | POA: Diagnosis not present

## 2024-08-18 DIAGNOSIS — G8929 Other chronic pain: Secondary | ICD-10-CM

## 2024-08-18 DIAGNOSIS — M47816 Spondylosis without myelopathy or radiculopathy, lumbar region: Secondary | ICD-10-CM | POA: Diagnosis not present

## 2024-08-18 DIAGNOSIS — M6289 Other specified disorders of muscle: Secondary | ICD-10-CM

## 2024-08-18 DIAGNOSIS — S76319D Strain of muscle, fascia and tendon of the posterior muscle group at thigh level, unspecified thigh, subsequent encounter: Secondary | ICD-10-CM

## 2024-08-18 DIAGNOSIS — M4316 Spondylolisthesis, lumbar region: Secondary | ICD-10-CM | POA: Diagnosis not present

## 2024-08-18 DIAGNOSIS — M79604 Pain in right leg: Secondary | ICD-10-CM

## 2024-08-20 ENCOUNTER — Ambulatory Visit: Payer: Self-pay | Admitting: Sports Medicine

## 2024-08-27 ENCOUNTER — Ambulatory Visit: Admitting: Physical Therapy

## 2024-08-27 NOTE — Progress Notes (Unsigned)
 [name] D[facility] [address] [place] [address] Phone: [phone]   Assessment and Plan:     1. Partial hamstring tear, subsequent encounter (Primary) 2. Right leg pain 3. Hamstring tightness of right lower extremity -Chronic with exacerbation, subsequent visit - Continued posterior right hip pain, with worsening flare over the past 3 days.  Still most consistent with low-grade partial tearing of conjoined tendon as seen on right hip MRI on 03/02/2024, however patient has had an atypical recovery timeframe with patient seeing no significant benefit despite 10+ months of conservative therapy, relative rest, HEP, physical therapy, PRP injection to conjoined tendon, NSAID course, prednisone course.  Lumbar MRI was obtained and reviewed at today's visit which showed facet arthrosis at L4-L5, but no neurologic impingement that could explain right lower extremity symptoms. - Continue HEP and physical therapy - Plan for 3 treatments of ECSWT targeting proximal hamstring tendon and musculature - Will refer to orthopedic surgery for evaluation to discuss possible surgical options.  Patient's goals include being able to walk for her daughter's graduation in December 2025. - CSI to hamstring tendon remains an option, though we have discussed the potential for CSI to increase tendon rupture risk.  Patient is not interested at [facility] at this time.    Pertinent previous records reviewed include lumbar MRI 08/18/2024   Follow Up: 3 treatment courses ECSWT   Subjective:   I, Isac Lincks, am serving as a Neurosurgeon for Doctor [employer]  Chief Complaint: Continued right posterior hip pain   HPI:    03/08/24 Office visit was transition to video visit due to MRI not being read at patient's scheduled MRI time.  Patient still is experiencing posterior right hip pain.   03/28/2024 PRP   05/10/2024 Patient states she didn't have any real  improvement    07/19/2024 Patient states that she is the same . PCP is okay with prednisone and will start her on diabetes medicine    08/16/2024 Patient states she is the same. Last week she was in flare  08/28/2024 Patient states here for MRI results. Last 2 days she has been in flare   Relevant Historical Information: Hypertension, DM type II  Additional pertinent review of systems negative.   Current Outpatient Medications:    benzonatate  (TESSALON ) 100 MG capsule, Take 1 capsule (100 mg total) by mouth 2 (two) times daily as needed for cough., Disp: 20 capsule, Rfl: 0   Cyanocobalamin  (VITAMIN B-12) 1000 MCG SUBL, Place 1 tablet (1,000 mcg total) under the tongue daily. (Patient not taking: Reported on 07/30/2024), Disp: 90 tablet, Rfl: 3   fluconazole  (DIFLUCAN ) 150 MG tablet, Take 1 tablet (150 mg total) by mouth every 3 (three) days as needed., Disp: 2 tablet, Rfl: 0   glucose blood (CONTOUR NEXT TEST) test strip, 100 each by Other route See admin instructions. Use as instructed, Disp: 100 strip, Rfl: 3   ibuprofen (ADVIL,MOTRIN) 200 MG tablet, Take 600 mg by mouth every 8 (eight) hours as needed (pain)., Disp: , Rfl:    levocetirizine (XYZAL) 5 MG tablet, Take 5 mg by mouth every evening. , Disp: , Rfl:    lisinopril  (ZESTRIL ) 10 MG tablet, TAKE 1 TABLET(10 MG) BY MOUTH DAILY FOR HIGH BLOOD PRESSURE, Disp: 90 tablet, Rfl: 0   LORazepam  (ATIVAN ) 0.5 MG tablet, 1-2 tabs 30 - 60 min prior to MRI. Do not drive with this medicine., Disp: 4 tablet, Rfl: 0   metFORMIN  (GLUCOPHAGE -XR) 500 MG  24 hr tablet, TAKE 4 TABLETS BY MOUTH DAILY, Disp: 360 tablet, Rfl: 3   methylPREDNISolone  (MEDROL  DOSEPAK) 4 MG TBPK tablet, Take 6 tablets on day 1.  Take 5 tablets on day 2.  Take 4 tablets on day 3.  Take 3 tablets on day 4.  Take 2 tablets on day 5.  Take 1 tablet on day 6. (Patient not taking: Reported on 07/30/2024), Disp: 21 tablet, Rfl: 0   Microlet Lancets MISC, USE TO CHECK BLOOD SUGAR ONE TO TWO  TIMES DAILY, Disp: 100 each, Rfl: 3   montelukast (SINGULAIR) 10 MG tablet, Take 10 mg by mouth every evening. , Disp: , Rfl:    Multiple Vitamin (MULTIVITAMIN WITH MINERALS) TABS tablet, Take 1 tablet by mouth daily., Disp: , Rfl:    omeprazole (PRILOSEC) 20 MG capsule, Take 20 mg by mouth every evening. , Disp: , Rfl:    QNASL 80 MCG/ACT AERS, Place 2 sprays into the nose every evening. , Disp: , Rfl:    simvastatin  (ZOCOR ) 40 MG tablet, TAKE 1 TABLET(40 MG) BY MOUTH DAILY AT 6 PM, Disp: 90 tablet, Rfl: 1   tirzepatide  (MOUNJARO ) 12.5 MG/0.5ML Pen, Inject 12.5 mg into the skin once a week., Disp: 6 mL, Rfl: 3   Objective:     Vitals:   08/28/24 0812  Pulse: (!) 103  SpO2: 98%  Weight: 182 lb (82.6 kg)  Height: 5' 3 (1.6 m)      Body mass index is 32.24 kg/m.    Physical Exam:    General: awake, alert, and oriented no acute distress, nontoxic Skin: no suspicious lesions or rashes Neuro:sensation intact distally with no deficits, normal muscle tone, no atrophy, strength 5/5 in all tested lower ext groups Psych: normal mood and affect, speech clear   Right hip: No deformity, swelling or wasting ROM Flexion 90, ext 30, IR 45, ER 45 TTP hamstring musculature NTTP over the hip flexors, greater trochanter, gluteal musculature, si joint, lumbar spine Negative log roll with FROM Negative FABER Negative FADIR Negative Piriformis test, though reproduced tension Negative trendelenberg Gait normal     Electronically signed by:  Odis Mace [facility] 9:56 AM 08/28/24

## 2024-08-28 ENCOUNTER — Encounter: Payer: Self-pay | Admitting: Physical Therapy

## 2024-08-28 ENCOUNTER — Ambulatory Visit (INDEPENDENT_AMBULATORY_CARE_PROVIDER_SITE_OTHER): Admitting: Sports Medicine

## 2024-08-28 ENCOUNTER — Ambulatory Visit (INDEPENDENT_AMBULATORY_CARE_PROVIDER_SITE_OTHER): Admitting: Physical Therapy

## 2024-08-28 ENCOUNTER — Telehealth: Payer: Self-pay | Admitting: Sports Medicine

## 2024-08-28 ENCOUNTER — Other Ambulatory Visit: Payer: Self-pay

## 2024-08-28 VITALS — HR 103 | Ht 63.0 in | Wt 182.0 lb

## 2024-08-28 DIAGNOSIS — M6289 Other specified disorders of muscle: Secondary | ICD-10-CM | POA: Diagnosis not present

## 2024-08-28 DIAGNOSIS — M6281 Muscle weakness (generalized): Secondary | ICD-10-CM | POA: Diagnosis not present

## 2024-08-28 DIAGNOSIS — S76319D Strain of muscle, fascia and tendon of the posterior muscle group at thigh level, unspecified thigh, subsequent encounter: Secondary | ICD-10-CM

## 2024-08-28 DIAGNOSIS — M79604 Pain in right leg: Secondary | ICD-10-CM

## 2024-08-28 NOTE — Telephone Encounter (Signed)
 Patient states that her work lost her FMLA paperwork. She is asking if she could have another copy. Please advise.

## 2024-08-28 NOTE — Telephone Encounter (Signed)
Copy given to front desk.

## 2024-08-28 NOTE — Patient Instructions (Signed)
 Access Code: [email or web address] URL: [email or web address] Date: 08/28/2024 Prepared by: Elaine Daring  Exercises - Prone Press Up  - 1-2 x daily - 2 sets - 10 reps - Hooklying Active Hamstring Stretch  - 1-2 x daily - 2 sets - 10 reps - 5 seconds hold - Supine Sciatic Nerve Glide  - 1-2 x daily - 2 sets - 10 reps - Bridge  - 1 x daily - 2 sets - 10 reps - 5 seconds hold

## 2024-08-28 NOTE — Therapy (Signed)
 OUTPATIENT PHYSICAL THERAPY EVALUATION   Patient Name: Nicole Gonzalez MRN: [id number] DOB:11-Oct-1971, 53 y.o., female Today's Date: 08/28/2024   END OF SESSION:  PT End of Session - 08/28/24 0820     Visit Number 1    Number of Visits 17    Date for PT Re-Evaluation 10/23/24    Authorization Type BCBS    PT Start Time 0835    PT Stop Time 0925    PT Time Calculation (min) 50 min    Activity Tolerance Patient tolerated treatment well    Behavior During Therapy WFL for tasks assessed/performed          Past Medical History:  Diagnosis Date   Anemia     better after hysterectomy   Cancer (HCC)    basil cell carcinoma/forehead   Diabetes mellitus without complication (HCC)    DIABETES MELLITUS, GESTATIONAL, HX OF 06/02/2009   Qualifier: Diagnosis of  By: Charlett MD, Apolinar POUR    Diverticulosis    Family history of breast cancer    Family history of genetic disease carrier    father has CDKN2A and ATM mutation   Family history of melanoma    Family history of pancreatic cancer    Fracture of lower leg     fall 2011   GERD (gastroesophageal reflux disease)    History of basal cell carcinoma excision    History of gestational diabetes    x2   HTN (hypertension)    Hyperlipidemia    [id number] 043 trx    Increased frequency of headaches 03/15/2012   Internal hemorrhoids    Obesity    Seizures (HCC)    as an infant d/t fever   Tubular adenoma of colon    UNSPECIFIED TACHYCARDIA 09/01/2010   Qualifier: Diagnosis of  By: Domenica MD, Harlene     Past Surgical History:  Procedure Laterality Date   ABDOMINAL HYSTERECTOMY  06/29/2010    question had abnormal cells on Path, fibroids   BASAL CELL CARCINOMA EXCISION     BREAST BIOPSY Right 2009   COLONOSCOPY WITH PROPOFOL  N/A 01/17/2019   Procedure: COLONOSCOPY WITH PROPOFOL ;  Surgeon: Teressa Toribio SQUIBB, MD;  Location: [facility];  Service: Endoscopy;  Laterality: N/A;   ESOPHAGOGASTRODUODENOSCOPY (EGD) WITH PROPOFOL  N/A 01/17/2019    Procedure: ESOPHAGOGASTRODUODENOSCOPY (EGD) WITH PROPOFOL ;  Surgeon: Teressa Toribio SQUIBB, MD;  Location: [facility];  Service: Endoscopy;  Laterality: N/A;   EUS N/A 01/17/2019   Procedure: UPPER ENDOSCOPIC ULTRASOUND (EUS) RADIAL;  Surgeon: Teressa Toribio SQUIBB, MD;  Location: [facility];  Service: Endoscopy;  Laterality: N/A;   POLYPECTOMY  01/17/2019   Procedure: POLYPECTOMY;  Surgeon: Teressa Toribio SQUIBB, MD;  Location: [facility];  Service: Endoscopy;;   TONSILLECTOMY     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   Hyperlipidemia 03/27/2015   Family history of breast cancer in female 03/27/2015   Rhinitis, allergic 10/16/2014   Left maxillary sinusitis 10/16/2014   Prediabetes 09/24/2014   Plantar fasciitis 10/22/2013   Abdominal swelling, mass, or lump felt by patient 03/26/2013   Leg length discrepancy 03/22/2013   Newly  diagnosed diabetes (HCC) 03/22/2013   Abdominal pain, chronic, right lower quadrant 03/22/2013   History of basal cell carcinoma excision    Visit for preventive health examination 03/15/2012   Hx of abdominal pain 03/15/2012   Anemia    OBESITY 07/15/2010   Allergic rhinitis 07/15/2010   HYPERGLYCEMIA 07/15/2010   Essential hypertension 06/02/2009   DIABETES MELLITUS, GESTATIONAL, HX OF 06/02/2009   KNEE PAIN 06/03/2008   HYPERLIPIDEMIA 10/09/2007   Esophagitis 10/09/2007   Hematuria 10/09/2007   HIDRADENITIS 10/09/2007    PCP: Charlett Apolinar POUR, MD  REFERRING PROVIDER: Leonce Katz, DO  REFERRING DIAG: Partial hamstring tear, subsequent encounter; Right leg pain; Hamstring tightness of right lower extremity; Chronic right-sided  low back pain with right-sided sciatica  Rationale for Evaluation and Treatment: Rehabilitation  THERAPY DIAG:  Pain in right leg  Muscle weakness (generalized)  ONSET DATE: Chronic, ongoing approximately 1 year   SUBJECTIVE:              SUBJECTIVE STATEMENT: Patient reports for about a year she has been having pain in the right hamstring that can radiate down past the knee, primarily with driving. She does feel some tightness during the day but driving seems to be the main aggravating factor. She has had PT in the past, and states the dry needling helped for short time, but nothing was long lasting. She states that not driving keeps her pain controlled. If she drives for 30 or more minutes then she will get the pain radiating down the leg, and it could cause tightening while driving shorter distances  PERTINENT HISTORY:  See PMH above  PAIN:  Are you having pain? Yes:  NPRS scale: 7/10 currently, typically 3-4/10 at rest Pain location: Proximal hamstring region, radiating down posterior thigh and occasionally past knee Pain description: Tightening, radiating Aggravating factors: Driving Relieving factors: Not driving  PRECAUTIONS: None  RED FLAGS: None   WEIGHT BEARING RESTRICTIONS: No  FALLS:  Has patient fallen in last 6 months? No  PLOF: Independent  PATIENT GOALS: Pain relief so she can drive without limitation   OBJECTIVE:  Note: Objective measures were completed at Evaluation unless otherwise noted. PATIENT SURVEYS:  PSFS: 4 Driving: 3 Sitting properly: 5  COGNITION: Overall cognitive status: Within functional limits for tasks assessed     SENSATION: WFL  MUSCLE LENGTH: Bilateral hamstring flexibility limitations  POSTURE:   Grossly WFL  PALPATION: Tenderness noted right piriformis, right mid/lateral hamstring region  LUMBAR ROM:   AROM eval  Flexion 50% - more limited with pull in hamstrings  Extension WFL  Right lateral flexion   Left  lateral flexion   Right rotation   Left rotation    (Blank rows = not tested)  When completing repeated extensions patient reports she seemed to forget about the right hamstring pain  LOWER EXTREMITY ROM:      Hip PROM grossly WFL and non-painful  LOWER EXTREMITY MMT:    MMT Right eval Left eval  Hip flexion 4 4  Hip extension 4 4  Hip abduction 4 4  Hip adduction    Hip internal rotation    Hip external rotation    Knee flexion 5 5  Knee extension 5 5  Ankle dorsiflexion    Ankle plantarflexion    Ankle inversion    Ankle eversion     (Blank rows = not tested)  FUNCTIONAL TESTS:  DLLT: 60 deg  GAIT: Assistive device utilized: None Level of assistance: Complete Independence Comments: [facility]  TREATMENT [facility] Adult PT Treatment:                                                DATE: 08/28/2024 Prone press up Supine active hamstring stretch with ankle DF Supine sciatic nerve mobilization with ankle DF/PF Bridge  Discussed muscle strain healing and scar tissue development, sciatic nerve tension and how that can simulate hamstring tightness and pain, possible lumbar contribution with her radiating pain location, focus of PT interventions, use of lumbar roll for sitting at work or in car  PATIENT EDUCATION:  Education details: Exam findings, POC, HEP Person educated: Patient Education method: Programmer, multimedia, Demonstration, Tactile cues, Verbal cues, and Handouts Education comprehension: verbalized understanding, returned demonstration, verbal cues required, tactile cues required, and needs further education  HOME EXERCISE PROGRAM: Access Code: [email or web address]    ASSESSMENT: CLINICAL IMPRESSION: Patient is a 52 y.o. female who was seen today for physical therapy evaluation and treatment for chronic right posterior thigh pain. She does have a history of hamstring strain but this has been going on for about a year so it is unlikely that her hamstring strain is the primary pain  generator she is experiencing. She does exhibit neural tension symptoms and possible right lumbar radicular symptoms with the report of radiating pain past the knee and into the lower leg. She does demonstrate a lumbar extension preference. She does have some deficits with her hip and core strength, and did not report any increased pain with hamstring muscle testing this visit. She does have tenders of the right mid/lateral hamstring region and also at the right piriformis.   OBJECTIVE IMPAIRMENTS: decreased activity tolerance, decreased ROM, decreased strength, impaired flexibility, and pain.   ACTIVITY LIMITATIONS: sitting, standing, and locomotion level  PARTICIPATION LIMITATIONS: driving, community activity, and occupation  PERSONAL FACTORS: Fitness, Past/current experiences, and Time since onset of injury/illness/exacerbation are also affecting patient's functional outcome.   REHAB POTENTIAL: Good  CLINICAL DECISION MAKING: Stable/uncomplicated  EVALUATION COMPLEXITY: Low   GOALS: Goals reviewed with patient? Yes  SHORT TERM GOALS: Target date: 09/25/2024  Patient will be I with initial HEP in order to progress with therapy. Baseline: HEP provided at eval Goal status: INITIAL  2.  Patient will report right posterior thigh pain </= 3/10 in order to reduce functional limitations Baseline: 7/10 Goal status: INITIAL  LONG TERM GOALS: Target date: 10/23/2024  Patient will be I with final HEP to maintain progress from PT. Baseline: HEP provided at eval Goal status: INITIAL  2.  Patient will report PSFS >/= 8 in order to indicate improvement in their functional ability. Baseline: 4 Goal status: INITIAL  3.  Patient will demonstrate DLLT </= 30 deg to indicate improvement in core strength and lumbopelvic control for reduce pain with driving and daily tasks Baseline: 60 deg Goal status: INITIAL  4.  Patient will report ability to drive >/= 1 hour not limited by pain in order  to improve traveling ability Baseline: patient reports limited to about 30 minutes driving Goal status: INITIAL   PLAN: PT FREQUENCY: 1-2x/week  PT DURATION: 8 weeks  PLANNED INTERVENTIONS: [id number]- PT Re-evaluation, 97750- Physical Performance Testing, 97110-Therapeutic exercises, 97530- Therapeutic activity, 97112- Neuromuscular re-education, 97535- Self Care, [id number]- Manual therapy, [id number]- Gait training, [phone] (1-2 muscles), [id number] (3+ muscles)- Dry Needling, Patient/Family education, Balance training, Stair training, Taping, Joint mobilization, Joint manipulation, Spinal manipulation,  Spinal mobilization, Cryotherapy, and Moist heat.  PLAN FOR NEXT SESSION: Review HEP and progress PRN, manual/IASTM/TPDN for the right hamstring and piriformis region, continue with hamstring and sciatic nerve mobility, progress core and hip strengthening, hamstring strengthening    Elaine Daring, PT, DPT, LAT, ATC 08/28/24  11:49 AM Phone: [phone] Fax: [phone]

## 2024-08-28 NOTE — Patient Instructions (Signed)
 Continue PT   [facility]   Ortho referral

## 2024-09-03 ENCOUNTER — Other Ambulatory Visit: Payer: Self-pay

## 2024-09-03 ENCOUNTER — Encounter: Payer: Self-pay | Admitting: Physical Therapy

## 2024-09-03 ENCOUNTER — Ambulatory Visit (INDEPENDENT_AMBULATORY_CARE_PROVIDER_SITE_OTHER): Admitting: Physical Therapy

## 2024-09-03 ENCOUNTER — Encounter (HOSPITAL_COMMUNITY): Payer: Self-pay | Admitting: Gastroenterology

## 2024-09-03 DIAGNOSIS — M79604 Pain in right leg: Secondary | ICD-10-CM | POA: Diagnosis not present

## 2024-09-03 DIAGNOSIS — M6281 Muscle weakness (generalized): Secondary | ICD-10-CM

## 2024-09-03 NOTE — Therapy (Signed)
 OUTPATIENT PHYSICAL THERAPY TREATMENT   Patient Name: Nicole Gonzalez MRN: [id number] DOB:1971/07/08, 53 y.o., female Today's Date: 09/03/2024   END OF SESSION:  PT End of Session - 09/03/24 1545     Visit Number 2    Number of Visits 17    Date for Recertification  10/23/24    Authorization Type BCBS    PT Start Time 1516    PT Stop Time 1600    PT Time Calculation (min) 44 min    Activity Tolerance Patient tolerated treatment well    Behavior During Therapy WFL for tasks assessed/performed           Past Medical History:  Diagnosis Date   Anemia     better after hysterectomy   Cancer (HCC)    basil cell carcinoma/forehead   Diabetes mellitus without complication (HCC)    DIABETES MELLITUS, GESTATIONAL, HX OF 06/02/2009   Qualifier: Diagnosis of  By: Charlett MD, Apolinar POUR    Diverticulosis    Family history of breast cancer    Family history of genetic disease carrier    father has CDKN2A and ATM mutation   Family history of melanoma    Family history of pancreatic cancer    Fracture of lower leg     fall 2011   GERD (gastroesophageal reflux disease)    History of basal cell carcinoma excision    History of gestational diabetes    x2   HTN (hypertension)    Hyperlipidemia    [id number] 043 trx    Increased frequency of headaches 03/15/2012   Internal hemorrhoids    Obesity    Seizures (HCC)    as an infant d/t fever   Tubular adenoma of colon    UNSPECIFIED TACHYCARDIA 09/01/2010   Qualifier: Diagnosis of  By: Domenica MD, Harlene     Past Surgical History:  Procedure Laterality Date   ABDOMINAL HYSTERECTOMY  06/29/2010    question had abnormal cells on Path, fibroids   BASAL CELL CARCINOMA EXCISION     BREAST BIOPSY Right 2009   COLONOSCOPY WITH PROPOFOL  N/A 01/17/2019   Procedure: COLONOSCOPY WITH PROPOFOL ;  Surgeon: Teressa Toribio SQUIBB, MD;  Location: [facility];  Service: Endoscopy;  Laterality: N/A;   ESOPHAGOGASTRODUODENOSCOPY (EGD) WITH PROPOFOL  N/A 01/17/2019    Procedure: ESOPHAGOGASTRODUODENOSCOPY (EGD) WITH PROPOFOL ;  Surgeon: Teressa Toribio SQUIBB, MD;  Location: [facility];  Service: Endoscopy;  Laterality: N/A;   EUS N/A 01/17/2019   Procedure: UPPER ENDOSCOPIC ULTRASOUND (EUS) RADIAL;  Surgeon: Teressa Toribio SQUIBB, MD;  Location: [facility];  Service: Endoscopy;  Laterality: N/A;   POLYPECTOMY  01/17/2019   Procedure: POLYPECTOMY;  Surgeon: Teressa Toribio SQUIBB, MD;  Location: [facility];  Service: Endoscopy;;   TONSILLECTOMY     Patient Active Problem List   Diagnosis Date Noted   History of colonic polyps    Polyp of ascending colon    Genetic testing 05/23/2018   Monoallelic mutation of CDKN2A gene 05/21/2018   Family history of pancreatic cancer    Family history of breast cancer    Family history of melanoma    Family history of gene mutation 04/06/2018   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   Hyperlipidemia 03/27/2015   Family history of breast cancer in female 03/27/2015   Rhinitis, allergic 10/16/2014   Left maxillary sinusitis 10/16/2014   Prediabetes 09/24/2014   Plantar fasciitis 10/22/2013   Abdominal swelling, mass, or lump felt by patient 03/26/2013   Leg length discrepancy 03/22/2013   Newly  diagnosed diabetes (HCC) 03/22/2013   Abdominal pain, chronic, right lower quadrant 03/22/2013   History of basal cell carcinoma excision    Visit for preventive health examination 03/15/2012   Hx of abdominal pain 03/15/2012   Anemia    OBESITY 07/15/2010   Allergic rhinitis 07/15/2010   HYPERGLYCEMIA 07/15/2010   Essential hypertension 06/02/2009   DIABETES MELLITUS, GESTATIONAL, HX OF 06/02/2009   KNEE PAIN 06/03/2008   HYPERLIPIDEMIA 10/09/2007   Esophagitis 10/09/2007   Hematuria 10/09/2007   HIDRADENITIS 10/09/2007    PCP: Charlett Apolinar POUR, MD  REFERRING PROVIDER: Leonce Katz, DO  REFERRING DIAG: Partial hamstring tear, subsequent encounter; Right leg pain; Hamstring tightness of right lower extremity; Chronic right-sided low  back pain with right-sided sciatica  Rationale for Evaluation and Treatment: Rehabilitation  THERAPY DIAG:  Pain in right leg  Muscle weakness (generalized)  ONSET DATE: Chronic, ongoing approximately 1 year   SUBJECTIVE:              SUBJECTIVE STATEMENT: Patient reports the right hamstring is feeling about the same. She did experience some left knee pain right in the lateral joint line over the weekend.   Eval: Patient reports for about a year she has been having pain in the right hamstring that can radiate down past the knee, primarily with driving. She does feel some tightness during the day but driving seems to be the main aggravating factor. She has had PT in the past, and states the dry needling helped for short time, but nothing was long lasting. She states that not driving keeps her pain controlled. If she drives for 30 or more minutes then she will get the pain radiating down the leg, and it could cause tightening while driving shorter distances  PERTINENT HISTORY:  See PMH above  PAIN:  Are you having pain? Yes:  NPRS scale: 5/10 currently, typically 3-4/10 at rest Pain location: Proximal hamstring region, radiating down posterior thigh and occasionally past knee Pain description: Tightening, radiating Aggravating factors: Driving Relieving factors: Not driving  PRECAUTIONS: None  PATIENT GOALS: Pain relief so she can drive without limitation   OBJECTIVE:  Note: Objective measures were completed at Evaluation unless otherwise noted. PATIENT SURVEYS:  PSFS: 4 Driving: 3 Sitting properly: 5  MUSCLE LENGTH: Bilateral hamstring flexibility limitations  POSTURE:   Grossly WFL  PALPATION: Tenderness noted right piriformis, right mid/lateral hamstring region  LUMBAR ROM:   AROM eval 09/03/2024  Flexion 50% - more limited with pull in hamstrings 75% - continued pull bilateral hamstring  Extension WFL   Right lateral flexion    Left lateral flexion    Right  rotation    Left rotation     (Blank rows = not tested)  When completing repeated extensions patient reports she seemed to forget about the right hamstring pain  LOWER EXTREMITY ROM:      Hip PROM grossly WFL and non-painful  LOWER EXTREMITY MMT:    MMT Right eval Left eval  Hip flexion 4 4  Hip extension 4 4  Hip abduction 4 4  Hip adduction    Hip internal rotation    Hip external rotation    Knee flexion 5 5  Knee extension 5 5  Ankle dorsiflexion    Ankle plantarflexion    Ankle inversion    Ankle eversion     (Blank rows = not tested)  FUNCTIONAL TESTS:  DLLT: 60 deg  GAIT: Assistive device utilized: None Level of assistance: Complete Independence Comments: [facility]  TREATMENT [facility] Adult PT Treatment:                                                DATE: 09/03/2024 Prone STM / TPR / IASTM / ART for the right hamstring Piriformis stretch 3 x 20 sec Supine active hamstring stretch with ankle DF 10 x 5 sec Supine sciatic nerve mobilization with ankle DF/PF x 10 Child's pose stretch 3 x 10 sec Side clamshell with green 2 x 15  PATIENT EDUCATION:  Education details: HEP update Person educated: Patient Education method: Explanation, Demonstration, Tactile cues, Verbal cues, and Handouts Education comprehension: verbalized understanding, returned demonstration, verbal cues required, tactile cues required, and needs further education  HOME EXERCISE PROGRAM: Access Code: [email or web address]    ASSESSMENT: CLINICAL IMPRESSION: Patient tolerated therapy well with no adverse effects. Therapy focused on improving her hamstring and hip mobility/flexibility and progressing her hip strengthening with good tolerance. She did demonstrate improvement with her lumbar flexion/forward bend but does seem to be more restricted due to hamstring vs neural tightness. Progress glute strengthening with good tolerance and updated her HEP. Patient would benefit from continued skilled PT to progress  mobility and strength in order to reduce pain and maximize functional ability.   Eval: Patient is a 53 y.o. female who was seen today for physical therapy evaluation and treatment for chronic right posterior thigh pain. She does have a history of hamstring strain but this has been going on for about a year so it is unlikely that her hamstring strain is the primary pain generator she is experiencing. She does exhibit neural tension symptoms and possible right lumbar radicular symptoms with the report of radiating pain past the knee and into the lower leg. She does demonstrate a lumbar extension preference. She does have some deficits with her hip and core strength, and did not report any increased pain with hamstring muscle testing this visit. She does have tenders of the right mid/lateral hamstring region and also at the right piriformis.   OBJECTIVE IMPAIRMENTS: decreased activity tolerance, decreased ROM, decreased strength, impaired flexibility, and pain.   ACTIVITY LIMITATIONS: sitting, standing, and locomotion level  PARTICIPATION LIMITATIONS: driving, community activity, and occupation  PERSONAL FACTORS: Fitness, Past/current experiences, and Time since onset of injury/illness/exacerbation are also affecting patient's functional outcome.    GOALS: Goals reviewed with patient? Yes  SHORT TERM GOALS: Target date: 09/25/2024  Patient will be I with initial HEP in order to progress with therapy. Baseline: HEP provided at eval Goal status: INITIAL  2.  Patient will report right posterior thigh pain </= 3/10 in order to reduce functional limitations Baseline: 7/10 Goal status: INITIAL  LONG TERM GOALS: Target date: 10/23/2024  Patient will be I with final HEP to maintain progress from PT. Baseline: HEP provided at eval Goal status: INITIAL  2.  Patient will report PSFS >/= 8 in order to indicate improvement in their functional ability. Baseline: 4 Goal status: INITIAL  3.   Patient will demonstrate DLLT </= 30 deg to indicate improvement in core strength and lumbopelvic control for reduce pain with driving and daily tasks Baseline: 60 deg Goal status: INITIAL  4.  Patient will report ability to drive >/= 1 hour not limited by pain in order to improve traveling ability Baseline: patient reports limited to about 30 minutes driving Goal status: INITIAL   PLAN:  PT FREQUENCY: 1-2x/week  PT DURATION: 8 weeks  PLANNED INTERVENTIONS: 97164- PT Re-evaluation, 97750- Physical Performance Testing, 97110-Therapeutic exercises, 97530- Therapeutic activity, 97112- Neuromuscular re-education, 97535- Self Care, 02859- Manual therapy, [phone]- Gait training, [phone] (1-2 muscles), 20561 (3+ muscles)- Dry Needling, Patient/Family education, Balance training, Stair training, Taping, Joint mobilization, Joint manipulation, Spinal manipulation, Spinal mobilization, Cryotherapy, and Moist heat.  PLAN FOR NEXT SESSION: Review HEP and progress PRN, manual/IASTM/TPDN for the right hamstring and piriformis region, continue with hamstring and sciatic nerve mobility, progress core and hip strengthening, hamstring strengthening    Elaine Daring, PT, DPT, LAT, ATC 09/03/24  4:07 PM Phone: [phone] Fax: [phone]

## 2024-09-03 NOTE — Patient Instructions (Signed)
 Access Code: [email or web address] URL: [email or web address] Date: 09/03/2024 Prepared by: Elaine Daring  Exercises - Prone Press Up  - 1-2 x daily - 2 sets - 10 reps - Hooklying Active Hamstring Stretch  - 1-2 x daily - 2 sets - 10 reps - 5 seconds hold - Supine Sciatic Nerve Glide  - 1-2 x daily - 2 sets - 10 reps - Bridge  - 1 x daily - 2 sets - 10 reps - 5 seconds hold - Clam with Resistance  - 1 x daily - 2 sets - 15 reps

## 2024-09-04 ENCOUNTER — Ambulatory Visit (HOSPITAL_COMMUNITY): Admitting: Anesthesiology

## 2024-09-04 ENCOUNTER — Encounter (HOSPITAL_COMMUNITY)
Admission: RE | Disposition: A | Discharge status: Home / Self Care | Payer: Self-pay | Source: Home / Self Care | Attending: Gastroenterology

## 2024-09-04 ENCOUNTER — Ambulatory Visit (HOSPITAL_COMMUNITY)
Admission: RE | Admit: 2024-09-04 | Discharge: 2024-09-04 | Disposition: A | Discharge status: Home / Self Care | Attending: Gastroenterology | Admitting: Gastroenterology

## 2024-09-04 ENCOUNTER — Encounter (HOSPITAL_COMMUNITY): Payer: Self-pay | Admitting: Gastroenterology

## 2024-09-04 ENCOUNTER — Telehealth: Payer: Self-pay

## 2024-09-04 DIAGNOSIS — K2289 Other specified disease of esophagus: Secondary | ICD-10-CM

## 2024-09-04 DIAGNOSIS — K2950 Unspecified chronic gastritis without bleeding: Secondary | ICD-10-CM | POA: Diagnosis not present

## 2024-09-04 DIAGNOSIS — Z1289 Encounter for screening for malignant neoplasm of other sites: Secondary | ICD-10-CM

## 2024-09-04 DIAGNOSIS — K8689 Other specified diseases of pancreas: Secondary | ICD-10-CM | POA: Diagnosis not present

## 2024-09-04 DIAGNOSIS — Z9189 Other specified personal risk factors, not elsewhere classified: Secondary | ICD-10-CM

## 2024-09-04 DIAGNOSIS — E119 Type 2 diabetes mellitus without complications: Secondary | ICD-10-CM | POA: Diagnosis not present

## 2024-09-04 DIAGNOSIS — K317 Polyp of stomach and duodenum: Secondary | ICD-10-CM

## 2024-09-04 DIAGNOSIS — K259 Gastric ulcer, unspecified as acute or chronic, without hemorrhage or perforation: Secondary | ICD-10-CM | POA: Diagnosis not present

## 2024-09-04 DIAGNOSIS — E785 Hyperlipidemia, unspecified: Secondary | ICD-10-CM | POA: Insufficient documentation

## 2024-09-04 DIAGNOSIS — K2970 Gastritis, unspecified, without bleeding: Secondary | ICD-10-CM | POA: Diagnosis not present

## 2024-09-04 DIAGNOSIS — E669 Obesity, unspecified: Secondary | ICD-10-CM | POA: Insufficient documentation

## 2024-09-04 DIAGNOSIS — K219 Gastro-esophageal reflux disease without esophagitis: Secondary | ICD-10-CM | POA: Diagnosis not present

## 2024-09-04 DIAGNOSIS — I899 Noninfective disorder of lymphatic vessels and lymph nodes, unspecified: Secondary | ICD-10-CM | POA: Diagnosis not present

## 2024-09-04 DIAGNOSIS — Z1509 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: Secondary | ICD-10-CM | POA: Diagnosis not present

## 2024-09-04 DIAGNOSIS — I10 Essential (primary) hypertension: Secondary | ICD-10-CM | POA: Diagnosis not present

## 2024-09-04 DIAGNOSIS — Z800 Family history of malignant neoplasm of digestive organs: Secondary | ICD-10-CM | POA: Diagnosis not present

## 2024-09-04 DIAGNOSIS — K3189 Other diseases of stomach and duodenum: Secondary | ICD-10-CM | POA: Diagnosis not present

## 2024-09-04 DIAGNOSIS — Z6834 Body mass index (BMI) 34.0-34.9, adult: Secondary | ICD-10-CM | POA: Diagnosis not present

## 2024-09-04 HISTORY — PX: ESOPHAGOGASTRODUODENOSCOPY: SHX5428

## 2024-09-04 HISTORY — PX: EUS: SHX5427

## 2024-09-04 LAB — GLUCOSE, CAPILLARY: Glucose-Capillary: 109 mg/dL — ABNORMAL HIGH (ref 70–99)

## 2024-09-04 SURGERY — EGD (ESOPHAGOGASTRODUODENOSCOPY)
Anesthesia: Monitor Anesthesia Care

## 2024-09-04 MED ORDER — GLYCOPYRROLATE PF 0.2 MG/ML IJ SOSY
PREFILLED_SYRINGE | INTRAMUSCULAR | Status: DC | PRN
  Administered 2024-09-04: .1 mg via INTRAVENOUS

## 2024-09-04 MED ORDER — LIDOCAINE 2% (20 MG/ML) 5 ML SYRINGE
INTRAMUSCULAR | Status: DC | PRN
  Administered 2024-09-04: 10 mg via INTRAVENOUS
  Administered 2024-09-04: 50 mg via INTRAVENOUS

## 2024-09-04 MED ORDER — PROPOFOL 500 MG/50ML IV EMUL
INTRAVENOUS | Status: DC | PRN
  Administered 2024-09-04: 40 mg via INTRAVENOUS
  Administered 2024-09-04: 60 mg via INTRAVENOUS
  Administered 2024-09-04: 50 mg via INTRAVENOUS
  Administered 2024-09-04: 70 mg via INTRAVENOUS
  Administered 2024-09-04: 40 mg via INTRAVENOUS
  Administered 2024-09-04: 130 mg via INTRAVENOUS
  Administered 2024-09-04: 50 mg via INTRAVENOUS
  Administered 2024-09-04: 60 mg via INTRAVENOUS
  Administered 2024-09-04: 40 mg via INTRAVENOUS

## 2024-09-04 MED ORDER — OMEPRAZOLE 40 MG PO CPDR: 40.0000 mg | DELAYED_RELEASE_CAPSULE | Freq: Every day | ORAL | 12 refills | Status: AC

## 2024-09-04 MED ORDER — PROPOFOL 10 MG/ML IV BOLUS
INTRAVENOUS | Status: AC
  Filled 2024-09-04: qty 20

## 2024-09-04 MED ORDER — PHENYLEPHRINE 80 MCG/ML (10ML) SYRINGE FOR IV PUSH (FOR BLOOD PRESSURE SUPPORT)
PREFILLED_SYRINGE | INTRAVENOUS | Status: DC | PRN
  Administered 2024-09-04: 160 ug via INTRAVENOUS
  Administered 2024-09-04 (×2): 80 ug via INTRAVENOUS

## 2024-09-04 MED ORDER — PROPOFOL 500 MG/50ML IV EMUL
INTRAVENOUS | Status: AC
  Filled 2024-09-04: qty 50

## 2024-09-04 MED ORDER — SODIUM CHLORIDE 0.9 % IV SOLN: INTRAVENOUS | Status: DC | PRN

## 2024-09-04 NOTE — H&P (Signed)
 GASTROENTEROLOGY PROCEDURE H&P NOTE   Primary Care Physician: Charlett Apolinar POUR, MD  HPI: Nicole Gonzalez is a 53 y.o. female who presents for EGD/EUS for High risk pancreatic cancer screening in setting of CDNK2A mutation.   Past Medical History:  Diagnosis Date   Anemia     better after hysterectomy   Cancer (HCC)    basil cell carcinoma/forehead   Diabetes mellitus without complication (HCC)    DIABETES MELLITUS, GESTATIONAL, HX OF 06/02/2009   Qualifier: Diagnosis of  By: Charlett MD, Apolinar POUR    Diverticulosis    Family history of breast cancer    Family history of genetic disease carrier    father has CDKN2A and ATM mutation   Family history of melanoma    Family history of pancreatic cancer    Fracture of lower leg     fall 2011   GERD (gastroesophageal reflux disease)    History of basal cell carcinoma excision    History of gestational diabetes    x2   HTN (hypertension)    Hyperlipidemia    [id number] 043 trx    Increased frequency of headaches 03/15/2012   Internal hemorrhoids    Obesity    Seizures (HCC)    as an infant d/t fever   Tubular adenoma of colon    UNSPECIFIED TACHYCARDIA 09/01/2010   Qualifier: Diagnosis of  By: Domenica MD, Harlene     Past Surgical History:  Procedure Laterality Date   ABDOMINAL HYSTERECTOMY  06/29/2010    question had abnormal cells on Path, fibroids   BASAL CELL CARCINOMA EXCISION     BREAST BIOPSY Right 2009   COLONOSCOPY WITH PROPOFOL  N/A 01/17/2019   Procedure: COLONOSCOPY WITH PROPOFOL ;  Surgeon: Teressa Toribio SQUIBB, MD;  Location: [facility];  Service: Endoscopy;  Laterality: N/A;   ESOPHAGOGASTRODUODENOSCOPY (EGD) WITH PROPOFOL  N/A 01/17/2019   Procedure: ESOPHAGOGASTRODUODENOSCOPY (EGD) WITH PROPOFOL ;  Surgeon: Teressa Toribio SQUIBB, MD;  Location: [facility];  Service: Endoscopy;  Laterality: N/A;   EUS N/A 01/17/2019   Procedure: UPPER ENDOSCOPIC ULTRASOUND (EUS) RADIAL;  Surgeon: Teressa Toribio SQUIBB, MD;  Location: [facility];   Service: Endoscopy;  Laterality: N/A;   POLYPECTOMY  01/17/2019   Procedure: POLYPECTOMY;  Surgeon: Teressa Toribio SQUIBB, MD;  Location: [facility];  Service: Endoscopy;;   TONSILLECTOMY     No current facility-administered medications for this encounter.   No current facility-administered medications for this encounter. Allergies  Allergen Reactions   Contrast Media [Iodinated Contrast Media] Other (See Comments)    ORAL and IV Fever and itchy mouth ORAL and IV Fever and itchy mouth   Adhesive [Tape]    Other     Adhesive, Metals   Polymyxin B -Trimethoprim  Swelling   Stadol [Butorphanol]    Family History  Problem Relation Age of Onset   Melanoma Father        x 3 times, first in 83's   Hypertension Father    Hyperlipidemia Father    Squamous cell carcinoma Father    Colon polyps Father    Heart disease Father    Pancreatic cancer Father 65       CDKN2A muation   Arthritis Brother         Psoriatic   Breast cancer Brother 80       ATM + mutation? or VUS   Diabetes Maternal Grandmother    Hyperlipidemia Maternal Grandmother    Heart disease Maternal Grandmother    Skin cancer Maternal Grandfather  Pancreatic cancer Paternal Grandmother 19       met liver   Heart disease Paternal Grandfather    Skin cancer Cousin    Breast cancer Other    Colon cancer Neg Hx    Stomach cancer Neg Hx    Esophageal cancer Neg Hx    Social History   Socioeconomic History   Marital status: Married    Spouse name: Not on file   Number of children: 2   Years of education: Not on file   Highest education level: Not on file  Occupational History   Occupation: Government social research officer: [employer]  Tobacco Use   Smoking status: Never   Smokeless tobacco: Never  Vaping Use   Vaping status: Never Used  Substance and Sexual Activity   Alcohol use: No   Drug use: No   Sexual activity: Not on file  Other Topics Concern   Not on file  Social History Narrative     Household of 4 -2 children   Son   to college     no pets     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    Nonsmoker married   Social Drivers of Corporate investment banker Strain: Not on file  Food Insecurity: No Food Insecurity (07/26/2023)   Hunger Vital Sign    Worried About Running Out of Food in the Last Year: Never true    Ran Out of Food in the Last Year: Never true  Transportation Needs: Not on file  Physical Activity: Sufficiently Active (07/26/2023)   Exercise Vital Sign    Days of Exercise per Week: 7 days    Minutes of Exercise per Session: 30 min  Stress: Stress Concern Present (07/26/2023)   [employer] of Occupational Health - Occupational Stress Questionnaire    Feeling of Stress : To some extent  Social Connections: Socially Integrated (07/26/2023)   Social Connection and Isolation Panel    Frequency of Communication with Friends and Family: More than three times a week    Frequency of Social Gatherings with Friends and Family: More than three times a week    Attends Religious Services: More than 4 times per year    Active Member of [employer] or Organizations: Yes    Attends Banker Meetings: More than 4 times per year    Marital Status: Married  Catering manager Violence: Not At Risk (07/26/2023)   Humiliation, Afraid, Rape, and Kick questionnaire    Fear of Current or Ex-Partner: No    Emotionally Abused: No    Physically Abused: No    Sexually Abused: No    Physical Exam: Today's Vitals   09/04/24 0817  BP: 128/77  Pulse: 86  Resp: (!) 21  Temp: (!) 97 F (36.1 C)  TempSrc: Temporal  SpO2: 93%  Weight: 87.1 kg  Height: 5' 3 (1.6 m)  PainSc: 3    Body mass index is 34.01 kg/m. GEN: NAD EYE: Sclerae anicteric ENT: MMM CV: Non-tachycardic GI: Soft, NT/ND NEURO:  Alert & Oriented x 3  Lab Results: No results for input(s): WBC, HGB, HCT, PLT in the last 72 hours. BMET No results for input(s): NA,  K, CL, CO2, GLUCOSE, BUN, CREATININE, CALCIUM in the last 72 hours. LFT No results for input(s): PROT, ALBUMIN, AST, ALT, ALKPHOS, BILITOT, BILIDIR, IBILI in the last 72 hours. PT/INR No results for input(s): LABPROT, INR  in the last 72 hours.   Impression / Plan: This is a 53 y.o.female who presents for EGD/EUS for High risk pancreatic cancer screening in setting of CDNK2A mutation.   The risks of an EUS including intestinal perforation, bleeding, infection, aspiration, and medication effects were discussed as was the possibility it may not give a definitive diagnosis if a biopsy is performed.  When a biopsy of the pancreas is done as part of the EUS, there is an additional risk of pancreatitis at the rate of about 1-2%.  It was explained that procedure related pancreatitis is typically mild, although it can be severe and even life threatening, which is why we do not perform random pancreatic biopsies and only biopsy a lesion/area we feel is concerning enough to warrant the risk.   The risks and benefits of endoscopic evaluation/treatment were discussed with the patient and/or family; these include but are not limited to the risk of perforation, infection, bleeding, missed lesions, lack of diagnosis, severe illness requiring hospitalization, as well as anesthesia and sedation related illnesses.  The patient's history has been reviewed, patient examined, no change in status, and deemed stable for procedure.  The patient and/or family is agreeable to proceed.    Aloha Finner, MD [facility]oscopy Office # [phone]

## 2024-09-04 NOTE — Transfer of Care (Signed)
 Immediate Anesthesia Transfer of Care Note  Patient: Nicole Gonzalez  Procedure(s) Performed: ULTRASOUND, UPPER GI TRACT, ENDOSCOPIC EGD (ESOPHAGOGASTRODUODENOSCOPY)  Patient Location: Endoscopy Unit  Anesthesia Type:MAC  Level of Consciousness: awake, alert , oriented, and patient cooperative  Airway & Oxygen Therapy: Patient Spontanous Breathing  Post-op Assessment: Report given to RN and Post -op Vital signs reviewed and stable  Post vital signs: Reviewed and stable  Last Vitals:  Vitals Value Taken Time  BP 106/70 09/04/24 09:33  Temp 36.3 C 09/04/24 09:33  Pulse 103 09/04/24 09:36  Resp 19 09/04/24 09:36  SpO2 95 % 09/04/24 09:36  Vitals shown include unfiled device data.  Last Pain:  Vitals:   09/04/24 0933  TempSrc: Temporal  PainSc: 0-No pain         Complications: There were no known notable events for this encounter.

## 2024-09-04 NOTE — Anesthesia Preprocedure Evaluation (Addendum)
 Anesthesia Evaluation  Patient identified by MRN, date of birth, ID band Patient awake    Reviewed: Allergy & Precautions, NPO status , Patient's Chart, lab work & pertinent test results, reviewed documented beta blocker date and time   History of Anesthesia Complications Negative for: history of anesthetic complications  Airway Mallampati: II  TM Distance: >3 FB     Dental no notable dental hx.    Pulmonary neg COPD   breath sounds clear to auscultation       Cardiovascular hypertension, (-) CAD, (-) Past MI, (-) Cardiac Stents and (-) CABG  Rhythm:Regular Rate:Normal     Neuro/Psych  Headaches, Seizures - (in infancy),     GI/Hepatic ,GERD  ,,(+) neg Cirrhosis        Endo/Other  diabetes, Type 2    Renal/GU Renal disease     Musculoskeletal   Abdominal   Peds  Hematology  (+) Blood dyscrasia, anemia   Anesthesia Other Findings   Reproductive/Obstetrics                              Anesthesia Physical Anesthesia Plan  ASA: 2  Anesthesia Plan: MAC   Post-op Pain Management:    Induction: Intravenous  PONV Risk Score and Plan: 2 and Ondansetron and Propofol  infusion  Airway Management Planned: Natural Airway and Simple Face Mask  Additional Equipment:   Intra-op Plan:   Post-operative Plan:   Informed Consent: I have reviewed the patients History and Physical, chart, labs and discussed the procedure including the risks, benefits and alternatives for the proposed anesthesia with the patient or authorized representative who has indicated his/her understanding and acceptance.     Dental advisory given  Plan Discussed with: CRNA  Anesthesia Plan Comments:          Anesthesia Quick Evaluation

## 2024-09-04 NOTE — Telephone Encounter (Signed)
-----   Message from [facility] sent at 09/04/2024  9:50 AM EDT ----- Regarding: High risk pancreas cancer screening cohort member Nicole Gonzalez, This patient is part of our high risk pancreas cancer screening cohort. She needs MRI/MRCP to be done in 1 year (placed under Dr. Pamula name). Pending all is well, EUS 2 years from now. Thanks. GM

## 2024-09-04 NOTE — Op Note (Signed)
 [facility] Patient Name: Nicole Gonzalez Procedure Date: 09/04/2024 MRN: [id number] Attending MD: Aloha Finner , MD, [id number] Date of Birth: 11/12/1971 CSN: [id number] Age: 53 Admit Type: Outpatient Procedure:                Upper EUS Indications:              Repeat EUS exam for pancreatic cancer screening in                            high-risk patient, Genetic mutation associated with                            pancreatic cancer risk identified; CDKN2A mutation                            positive, Family history of pancreatic ductal                            adenocarcinoma in one first-degree relative, Family                            history of pancreatic ductal adenocarcinoma in                            other relative Providers:                Aloha Finner, MD, Randall Lines, RN, Haskel Chris, Technician Referring MD:              Medicines:                Monitored Anesthesia Care Complications:            No immediate complications. Estimated Blood Loss:     Estimated blood loss was minimal. Procedure:                Pre-Anesthesia Assessment:                           - Prior to the procedure, a History and Physical                            was performed, and patient medications and                            allergies were reviewed. The patient's tolerance of                            previous anesthesia was also reviewed. The risks                            and benefits of the procedure and the sedation                            options and risks were discussed with  the patient.                            All questions were answered, and informed consent                            was obtained. Prior Anticoagulants: The patient has                            taken no anticoagulant or antiplatelet agents                            except for NSAID medication. ASA Grade Assessment:                            II  - A patient with mild systemic disease. After                            reviewing the risks and benefits, the patient was                            deemed in satisfactory condition to undergo the                            procedure.                           After obtaining informed consent, the endoscope was                            passed under direct vision. Throughout the                            procedure, the patient's blood pressure, pulse, and                            oxygen saturations were monitored continuously. The                            [id number]-[id number] ([id number]) Olympus endoscope was introduced                            through the mouth, and advanced to the second part                            of duodenum. The [id number]-[id number] ([id number]) Olympus                            duodenoscope was introduced through the mouth, and                            advanced to the area of papilla. The [id number]                            (  [id number]) Olympus endosonoscope was introduced                            through the mouth, and advanced to the duodenum for                            ultrasound examination from the stomach and                            duodenum. The upper EUS was accomplished without                            difficulty. The patient tolerated the procedure. Scope In: Scope Out: Findings:      ENDOSCOPIC FINDING: :      No gross lesions were noted in the entire esophagus.      The Z-line was irregular and was found 39 cm from the incisors.      Multiple small semi-sessile polyps (consistent with fundic gland polyps)       with no bleeding and no stigmata of recent bleeding were found in the       cardia, in the gastric fundus and in the gastric body. Biopsies were       taken with a cold forceps for histology.      Multiple dispersed small erosions with no bleeding and no stigmata of       recent bleeding were found in the gastric antrum.      No other gross lesions  were noted in the entire examined stomach.       Biopsies were taken with a cold forceps for histology and Helicobacter       pylori testing.      No gross lesions were noted in the duodenal bulb, in the first portion       of the duodenum and in the second portion of the duodenum.      The major papilla was normal.      ENDOSONOGRAPHIC FINDING: :      EUS imaging of the pancreas was performed for pancreatic cancer       screening, including evaluation of pancreatic parenchyma and the main       pancreatic duct (MPD).      Pancreatic parenchymal abnormalities were noted in the entire pancreas.       These consisted of hyperechoic strands.      The diameter of the main pancreatic duct (MPD) measured:      - HOP 0.5 -> 1.9 mm (head of pancreas)      - NOP 1.5 mm (neck of pancreas)      - BOP 0.6 mm (body of the pancreas)      - TOP 0.5 mm (tail of the pancreas).      There was no sign of significant endosonographic abnormality in the       common bile duct (1.6 -> 4.7 mm) and in the common hepatic duct (5.2       mm). The bile duct had normal contour. An unremarkable gallbladder was       identified.      Endosonographic imaging of the ampulla showed no intramural       (subepithelial) lesion.      Endosonographic imaging in the visualized portion of the liver showed no  mass.      No malignant-appearing lymph nodes were visualized in the celiac region       (level 20), peripancreatic region and porta hepatis region.      The celiac region was visualized. Impression:               EGD impression:                           - No gross lesions in the entire esophagus. Z-line                            irregular, 39 cm from the incisors.                           - Multiple gastric polyps (likely fundic gland).                            Biopsied.                           - Erosive gastropathy with no bleeding and no                            stigmata of recent bleeding found in  the antrum. No                            other gross lesions in the entire stomach. Biopsied.                           - No gross lesions in the duodenal bulb, in the                            first portion of the duodenum and in the second                            portion of the duodenum.                           - Normal major papilla.                           EUS impression:                           - Overall impression of EUS exam for pancreatic                            cancer screening: Normal exam.                           - Pancreatic parenchymal abnormalities consisting                            of hyperechoic strands were noted in the entire  pancreas.                           - Main pancreatic duct (MPD) diameter was measured.                            Endosonographically, the MPD had a normal                            appearance.                           - There was no sign of significant pathology in the                            common bile duct and in the common hepatic duct.                           - No malignant-appearing lymph nodes were                            visualized in the celiac region (level 20),                            peripancreatic region and porta hepatis region. Moderate Sedation:      Not Applicable - Patient had care per Anesthesia. Recommendation:           - The patient will be observed post-procedure,                            until all discharge criteria are met.                           - Discharge patient to home.                           - Patient has a contact number available for                            emergencies. The signs and symptoms of potential                            delayed complications were discussed with the                            patient. Return to normal activities tomorrow.                            Written discharge instructions were provided to the                             patient.                           - Resume previous diet.                           -  Observe patient's clinical course.                           - For future surveillance for pancreatic cancer,                            alternating upper endoscopic ultrasound and                            cross-sectional imaging is recommended. Would                            recommend MRI/MRCP in 1 year.                           - Await path results.                           - Increase to omeprazole  40 mg PO daily.                           - Minimize NSAIDs as able.                           - The findings and recommendations were discussed                            with the patient.                           - The findings and recommendations were discussed                            with the patient's family. Procedure Code(s):        --- Professional ---                           [phone], Esophagogastroduodenoscopy, flexible,                            transoral; with endoscopic ultrasound examination                            limited to the esophagus, stomach or duodenum, and                            adjacent structures                           43239, Esophagogastroduodenoscopy, flexible,                            transoral; with biopsy, single or multiple Diagnosis Code(s):        --- Professional ---                           K22.89, Other specified disease of esophagus  K31.7, Polyp of stomach and duodenum                           K31.89, Other diseases of stomach and duodenum                           K86.9, Disease of pancreas, unspecified                           I89.9, Noninfective disorder of lymphatic vessels                            and lymph nodes, unspecified                           Z12.89, Encounter for screening for malignant                            neoplasm of other sites                           Z15.09, Genetic susceptibility  to other malignant                            neoplasm                           Z80.0, Family history of malignant neoplasm of                            digestive organs CPT copyright 2022 [employer]. All rights reserved. The codes documented in this report are preliminary and upon coder review may  be revised to meet current compliance requirements. Aloha Finner, MD 09/04/2024 9:47:50 AM Number of Addenda: 0

## 2024-09-04 NOTE — Discharge Instructions (Signed)

## 2024-09-04 NOTE — Telephone Encounter (Signed)
 MRI MRCP to be set up in 1 year Recall EUS has been entered

## 2024-09-05 ENCOUNTER — Encounter (HOSPITAL_COMMUNITY): Payer: Self-pay | Admitting: Gastroenterology

## 2024-09-05 LAB — SURGICAL PATHOLOGY

## 2024-09-05 NOTE — Anesthesia Postprocedure Evaluation (Signed)
 Anesthesia Post Note  Patient: Nicole Gonzalez  Procedure(s) Performed: ULTRASOUND, UPPER GI TRACT, ENDOSCOPIC EGD (ESOPHAGOGASTRODUODENOSCOPY)     Patient location during evaluation: PACU Anesthesia Type: MAC Level of consciousness: awake and alert Pain management: pain level controlled Vital Signs Assessment: post-procedure vital signs reviewed and stable Respiratory status: spontaneous breathing, nonlabored ventilation, respiratory function stable and patient connected to nasal cannula oxygen Cardiovascular status: stable and blood pressure returned to baseline Postop Assessment: no apparent nausea or vomiting Anesthetic complications: no   There were no known notable events for this encounter.  Last Vitals:  Vitals:   09/04/24 0940 09/04/24 0950  BP: 97/71 105/70  Pulse: 95 76  Resp: (!) 23 18  Temp:    SpO2: 93% 93%    Last Pain:  Vitals:   09/04/24 0950  TempSrc:   PainSc: 0-No pain                 Lynwood MARLA Cornea

## 2024-09-06 ENCOUNTER — Encounter: Payer: Self-pay | Admitting: Physical Therapy

## 2024-09-06 ENCOUNTER — Ambulatory Visit (INDEPENDENT_AMBULATORY_CARE_PROVIDER_SITE_OTHER): Admitting: Physical Therapy

## 2024-09-06 ENCOUNTER — Other Ambulatory Visit: Payer: Self-pay

## 2024-09-06 DIAGNOSIS — M79604 Pain in right leg: Secondary | ICD-10-CM | POA: Diagnosis not present

## 2024-09-06 DIAGNOSIS — M6281 Muscle weakness (generalized): Secondary | ICD-10-CM

## 2024-09-06 NOTE — Therapy (Signed)
 OUTPATIENT PHYSICAL THERAPY TREATMENT   Patient Name: Nicole Gonzalez MRN: [id number] DOB:20-Mar-1971, 53 y.o., female Today's Date: 09/06/2024   END OF SESSION:  PT End of Session - 09/06/24 0757     Visit Number 3    Number of Visits 17    Date for Recertification  10/23/24    Authorization Type [employer]    PT Start Time 0800    PT Stop Time 0840    PT Time Calculation (min) 40 min    Activity Tolerance Patient tolerated treatment well    Behavior During Therapy WFL for tasks assessed/performed            Past Medical History:  Diagnosis Date   Anemia     better after hysterectomy   Cancer (HCC)    basil cell carcinoma/forehead   Diabetes mellitus without complication (HCC)    DIABETES MELLITUS, GESTATIONAL, HX OF 06/02/2009   Qualifier: Diagnosis of  By: Charlett MD, Apolinar POUR    Diverticulosis    Family history of breast cancer    Family history of genetic disease carrier    father has CDKN2A and ATM mutation   Family history of melanoma    Family history of pancreatic cancer    Fracture of lower leg     fall 2011   GERD (gastroesophageal reflux disease)    History of basal cell carcinoma excision    History of gestational diabetes    x2   HTN (hypertension)    Hyperlipidemia    [id number] 043 trx    Increased frequency of headaches 03/15/2012   Internal hemorrhoids    Obesity    Seizures (HCC)    as an infant d/t fever   Tubular adenoma of colon    UNSPECIFIED TACHYCARDIA 09/01/2010   Qualifier: Diagnosis of  By: Domenica MD, Harlene     Past Surgical History:  Procedure Laterality Date   ABDOMINAL HYSTERECTOMY  06/29/2010    question had abnormal cells on Path, fibroids   BASAL CELL CARCINOMA EXCISION     BREAST BIOPSY Right 2009   COLONOSCOPY WITH PROPOFOL  N/A 01/17/2019   Procedure: COLONOSCOPY WITH PROPOFOL ;  Surgeon: Teressa Toribio SQUIBB, MD;  Location: [facility];  Service: Endoscopy;  Laterality: N/A;   ESOPHAGOGASTRODUODENOSCOPY N/A 09/04/2024   Procedure: EGD  (ESOPHAGOGASTRODUODENOSCOPY);  Surgeon: Wilhelmenia Aloha Raddle., MD;  Location: [facility];  Service: Gastroenterology;  Laterality: N/A;   ESOPHAGOGASTRODUODENOSCOPY (EGD) WITH PROPOFOL  N/A 01/17/2019   Procedure: ESOPHAGOGASTRODUODENOSCOPY (EGD) WITH PROPOFOL ;  Surgeon: Teressa Toribio SQUIBB, MD;  Location: [facility];  Service: Endoscopy;  Laterality: N/A;   EUS N/A 01/17/2019   Procedure: UPPER ENDOSCOPIC ULTRASOUND (EUS) RADIAL;  Surgeon: Teressa Toribio SQUIBB, MD;  Location: [facility];  Service: Endoscopy;  Laterality: N/A;   EUS N/A 09/04/2024   Procedure: ULTRASOUND, UPPER GI TRACT, ENDOSCOPIC;  Surgeon: Wilhelmenia Aloha Raddle., MD;  Location: [facility];  Service: Gastroenterology;  Laterality: N/A;   POLYPECTOMY  01/17/2019   Procedure: POLYPECTOMY;  Surgeon: Teressa Toribio SQUIBB, MD;  Location: [facility];  Service: Endoscopy;;   TONSILLECTOMY     Patient Active Problem List   Diagnosis Date Noted   At high risk for pancreatic cancer 09/04/2024   At risk for cancer 09/04/2024   Erosive gastropathy 09/04/2024   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 09/04/2024   History of colonic polyps    Polyp of ascending colon    Genetic testing 05/23/2018   Monoallelic mutation of CDKN2A gene 05/21/2018   Family history  of pancreatic cancer    Family history of breast cancer    Family history of melanoma    Family history of gene mutation 04/06/2018   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   Hyperlipidemia 03/27/2015   Family history of breast cancer in female 03/27/2015   Rhinitis, allergic 10/16/2014   Left maxillary sinusitis 10/16/2014   Prediabetes 09/24/2014   Plantar fasciitis 10/22/2013   Abdominal swelling, mass, or lump felt by patient 03/26/2013   Leg length discrepancy 03/22/2013   Newly diagnosed diabetes (HCC) 03/22/2013   Abdominal pain, chronic, right lower quadrant 03/22/2013   History of basal cell carcinoma excision    Visit for preventive health examination 03/15/2012   Hx  of abdominal pain 03/15/2012   Anemia    OBESITY 07/15/2010   Allergic rhinitis 07/15/2010   HYPERGLYCEMIA 07/15/2010   Essential hypertension 06/02/2009   DIABETES MELLITUS, GESTATIONAL, HX OF 06/02/2009   KNEE PAIN 06/03/2008   HYPERLIPIDEMIA 10/09/2007   Esophagitis 10/09/2007   Hematuria 10/09/2007   HIDRADENITIS 10/09/2007    PCP: Charlett Apolinar POUR, MD  REFERRING PROVIDER: Leonce Katz, DO  REFERRING DIAG: Partial hamstring tear, subsequent encounter; Right leg pain; Hamstring tightness of right lower extremity; Chronic right-sided low back pain with right-sided sciatica  Rationale for Evaluation and Treatment: Rehabilitation  THERAPY DIAG:  Pain in right leg  Muscle weakness (generalized)  ONSET DATE: Chronic, ongoing approximately 1 year   SUBJECTIVE:              SUBJECTIVE STATEMENT: Patient reports the right hamstring is feeling about the same. She did experience some left knee pain right in the lateral joint line over the weekend.   Eval: Patient reports for about a year she has been having pain in the right hamstring that can radiate down past the knee, primarily with driving. She does feel some tightness during the day but driving seems to be the main aggravating factor. She has had PT in the past, and states the dry needling helped for short time, but nothing was long lasting. She states that not driving keeps her pain controlled. If she drives for 30 or more minutes then she will get the pain radiating down the leg, and it could cause tightening while driving shorter distances  PERTINENT HISTORY:  See PMH above  PAIN:  Are you having pain? Yes:  NPRS scale: 2/10 currently, typically 3-4/10 at rest Pain location: Proximal hamstring region, radiating down posterior thigh and occasionally past knee Pain description: Tightening, radiating Aggravating factors: Driving Relieving factors: Not driving  PRECAUTIONS: None  PATIENT GOALS: Pain relief so she can  drive without limitation   OBJECTIVE:  Note: Objective measures were completed at Evaluation unless otherwise noted. PATIENT SURVEYS:  PSFS: 4 Driving: 3 Sitting properly: 5  MUSCLE LENGTH: Bilateral hamstring flexibility limitations  POSTURE:   Grossly WFL  PALPATION: Tenderness noted right piriformis, right mid/lateral hamstring region  LUMBAR ROM:   AROM eval 09/03/2024  Flexion 50% - more limited with pull in hamstrings 75% - continued pull bilateral hamstring  Extension WFL   Right lateral flexion    Left lateral flexion    Right rotation    Left rotation     (Blank rows = not tested)  When completing repeated extensions patient reports she seemed to forget about the right hamstring pain  LOWER EXTREMITY ROM:      Hip PROM grossly WFL and non-painful  LOWER EXTREMITY MMT:    MMT Right eval Left eval  Hip flexion  4 4  Hip extension 4 4  Hip abduction 4 4  Hip adduction    Hip internal rotation    Hip external rotation    Knee flexion 5 5  Knee extension 5 5  Ankle dorsiflexion    Ankle plantarflexion    Ankle inversion    Ankle eversion     (Blank rows = not tested)  FUNCTIONAL TESTS:  DLLT: 60 deg  GAIT: Assistive device utilized: None Level of assistance: Complete Independence Comments: WFL   TREATMENT [facility] Adult PT Treatment:                                                DATE: 09/06/2024 Prone STM / TPR / IASTM / ART for the right hamstring Muscle release of right glute and piriformis in prone with passive hip IR/ER Seated SMFR for hamstring using tennis ball with active knee extension/flexion Supine sciatic nerve mobilization with ankle DF/PF 2 x 10 Bridge with heels on stability ball 2 x 10 909-90 alternating foot tap 2 x 10, leg extension x 10 Lateral band walk with blue at knees 2 x 20 down/back, at ankles x 20 down/back Dead lift with 30# 2 x 10  PATIENT EDUCATION:  Education details: HEP Person educated: Patient Education  method: Explanation, Demonstration, Tactile cues, Verbal cues Education comprehension: verbalized understanding, returned demonstration, verbal cues required, tactile cues required, and needs further education  HOME EXERCISE PROGRAM: Access Code: [email or web address]    ASSESSMENT: CLINICAL IMPRESSION: Patient tolerated therapy well with no adverse effects. Therapy continued with manual therapy for the right hamstring with good therapeutic benefit. Progressed with her hip strengthening and incorporated lifting this visit with good tolerance. She does report improvement in her hamstring pain this visit. No changes made to her HEP. Patient would benefit from continued skilled PT to progress mobility and strength in order to reduce pain and maximize functional ability.   Eval: Patient is a 53 y.o. female who was seen today for physical therapy evaluation and treatment for chronic right posterior thigh pain. She does have a history of hamstring strain but this has been going on for about a year so it is unlikely that her hamstring strain is the primary pain generator she is experiencing. She does exhibit neural tension symptoms and possible right lumbar radicular symptoms with the report of radiating pain past the knee and into the lower leg. She does demonstrate a lumbar extension preference. She does have some deficits with her hip and core strength, and did not report any increased pain with hamstring muscle testing this visit. She does have tenders of the right mid/lateral hamstring region and also at the right piriformis.   OBJECTIVE IMPAIRMENTS: decreased activity tolerance, decreased ROM, decreased strength, impaired flexibility, and pain.   ACTIVITY LIMITATIONS: sitting, standing, and locomotion level  PARTICIPATION LIMITATIONS: driving, community activity, and occupation  PERSONAL FACTORS: Fitness, Past/current experiences, and Time since onset of injury/illness/exacerbation are also affecting patient's  functional outcome.    GOALS: Goals reviewed with patient? Yes  SHORT TERM GOALS: Target date: 09/25/2024  Patient will be I with initial HEP in order to progress with therapy. Baseline: HEP provided at eval Goal status: INITIAL  2.  Patient will report right posterior thigh pain </= 3/10 in order to reduce functional limitations Baseline: 7/10 Goal status: INITIAL  LONG TERM GOALS: Target date:  10/23/2024  Patient will be I with final HEP to maintain progress from PT. Baseline: HEP provided at eval Goal status: INITIAL  2.  Patient will report PSFS >/= 8 in order to indicate improvement in their functional ability. Baseline: 4 Goal status: INITIAL  3.  Patient will demonstrate DLLT </= 30 deg to indicate improvement in core strength and lumbopelvic control for reduce pain with driving and daily tasks Baseline: 60 deg Goal status: INITIAL  4.  Patient will report ability to drive >/= 1 hour not limited by pain in order to improve traveling ability Baseline: patient reports limited to about 30 minutes driving Goal status: INITIAL   PLAN: PT FREQUENCY: 1-2x/week  PT DURATION: 8 weeks  PLANNED INTERVENTIONS: 97164- PT Re-evaluation, 97750- Physical Performance Testing, 97110-Therapeutic exercises, 97530- Therapeutic activity, 97112- Neuromuscular re-education, 97535- Self Care, 02859- Manual therapy, 97116- Gait training, [phone] (1-2 muscles), 20561 (3+ muscles)- Dry Needling, Patient/Family education, Balance training, Stair training, Taping, Joint mobilization, Joint manipulation, Spinal manipulation, Spinal mobilization, Cryotherapy, and Moist heat.  PLAN FOR NEXT SESSION: Review HEP and progress PRN, manual/IASTM/TPDN for the right hamstring and piriformis region, continue with hamstring and sciatic nerve mobility, progress core and hip strengthening, hamstring strengthening    Elaine Daring, PT, DPT, LAT, ATC 09/06/24  8:56 AM Phone: [phone] Fax:  [phone]

## 2024-09-09 ENCOUNTER — Ambulatory Visit: Payer: Self-pay | Admitting: Gastroenterology

## 2024-09-10 ENCOUNTER — Other Ambulatory Visit: Payer: Self-pay

## 2024-09-10 ENCOUNTER — Ambulatory Visit: Admitting: Physical Therapy

## 2024-09-10 ENCOUNTER — Encounter: Payer: Self-pay | Admitting: Physical Therapy

## 2024-09-10 DIAGNOSIS — M79604 Pain in right leg: Secondary | ICD-10-CM

## 2024-09-10 DIAGNOSIS — M6281 Muscle weakness (generalized): Secondary | ICD-10-CM

## 2024-09-10 NOTE — Therapy (Signed)
 OUTPATIENT PHYSICAL THERAPY TREATMENT   Patient Name: Nicole Gonzalez MRN: [id number] DOB:March 05, 1971, 53 y.o., female Today's Date: 09/10/2024   END OF SESSION:  PT End of Session - 09/10/24 0906     Visit Number 4    Number of Visits 17    Date for Recertification  10/23/24    Authorization Type BCBS    PT Start Time 0845    PT Stop Time 0925    PT Time Calculation (min) 40 min    Activity Tolerance Patient tolerated treatment well    Behavior During Therapy WFL for tasks assessed/performed             Past Medical History:  Diagnosis Date   Anemia     better after hysterectomy   Cancer (HCC)    basil cell carcinoma/forehead   Diabetes mellitus without complication (HCC)    DIABETES MELLITUS, GESTATIONAL, HX OF 06/02/2009   Qualifier: Diagnosis of  By: Charlett MD, Apolinar POUR    Diverticulosis    Family history of breast cancer    Family history of genetic disease carrier    father has CDKN2A and ATM mutation   Family history of melanoma    Family history of pancreatic cancer    Fracture of lower leg     fall 2011   GERD (gastroesophageal reflux disease)    History of basal cell carcinoma excision    History of gestational diabetes    x2   HTN (hypertension)    Hyperlipidemia    [id number] 043 trx    Increased frequency of headaches 03/15/2012   Internal hemorrhoids    Obesity    Seizures (HCC)    as an infant d/t fever   Tubular adenoma of colon    UNSPECIFIED TACHYCARDIA 09/01/2010   Qualifier: Diagnosis of  By: Domenica MD, Harlene     Past Surgical History:  Procedure Laterality Date   ABDOMINAL HYSTERECTOMY  06/29/2010    question had abnormal cells on Path, fibroids   BASAL CELL CARCINOMA EXCISION     BREAST BIOPSY Right 2009   COLONOSCOPY WITH PROPOFOL  N/A 01/17/2019   Procedure: COLONOSCOPY WITH PROPOFOL ;  Surgeon: Teressa Toribio SQUIBB, MD;  Location: [facility];  Service: Endoscopy;  Laterality: N/A;   ESOPHAGOGASTRODUODENOSCOPY N/A 09/04/2024   Procedure: EGD  (ESOPHAGOGASTRODUODENOSCOPY);  Surgeon: Wilhelmenia Aloha Raddle., MD;  Location: [facility];  Service: Gastroenterology;  Laterality: N/A;   ESOPHAGOGASTRODUODENOSCOPY (EGD) WITH PROPOFOL  N/A 01/17/2019   Procedure: ESOPHAGOGASTRODUODENOSCOPY (EGD) WITH PROPOFOL ;  Surgeon: Teressa Toribio SQUIBB, MD;  Location: [facility];  Service: Endoscopy;  Laterality: N/A;   EUS N/A 01/17/2019   Procedure: UPPER ENDOSCOPIC ULTRASOUND (EUS) RADIAL;  Surgeon: Teressa Toribio SQUIBB, MD;  Location: [facility];  Service: Endoscopy;  Laterality: N/A;   EUS N/A 09/04/2024   Procedure: ULTRASOUND, UPPER GI TRACT, ENDOSCOPIC;  Surgeon: Wilhelmenia Aloha Raddle., MD;  Location: [facility];  Service: Gastroenterology;  Laterality: N/A;   POLYPECTOMY  01/17/2019   Procedure: POLYPECTOMY;  Surgeon: Teressa Toribio SQUIBB, MD;  Location: [facility];  Service: Endoscopy;;   TONSILLECTOMY     Patient Active Problem List   Diagnosis Date Noted   At high risk for pancreatic cancer 09/04/2024   At risk for cancer 09/04/2024   Erosive gastropathy 09/04/2024   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 09/04/2024   History of colonic polyps    Polyp of ascending colon    Genetic testing 05/23/2018   Monoallelic mutation of CDKN2A gene 05/21/2018   Family  history of pancreatic cancer    Family history of breast cancer    Family history of melanoma    Family history of gene mutation 04/06/2018   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   Hyperlipidemia 03/27/2015   Family history of breast cancer in female 03/27/2015   Rhinitis, allergic 10/16/2014   Left maxillary sinusitis 10/16/2014   Prediabetes 09/24/2014   Plantar fasciitis 10/22/2013   Abdominal swelling, mass, or lump felt by patient 03/26/2013   Leg length discrepancy 03/22/2013   Newly diagnosed diabetes (HCC) 03/22/2013   Abdominal pain, chronic, right lower quadrant 03/22/2013   History of basal cell carcinoma excision    Visit for preventive health examination 03/15/2012   Hx  of abdominal pain 03/15/2012   Anemia    OBESITY 07/15/2010   Allergic rhinitis 07/15/2010   HYPERGLYCEMIA 07/15/2010   Essential hypertension 06/02/2009   DIABETES MELLITUS, GESTATIONAL, HX OF 06/02/2009   KNEE PAIN 06/03/2008   HYPERLIPIDEMIA 10/09/2007   Esophagitis 10/09/2007   Hematuria 10/09/2007   HIDRADENITIS 10/09/2007    PCP: Charlett Apolinar POUR, MD  REFERRING PROVIDER: Leonce Katz, DO  REFERRING DIAG: Partial hamstring tear, subsequent encounter; Right leg pain; Hamstring tightness of right lower extremity; Chronic right-sided low back pain with right-sided sciatica  Rationale for Evaluation and Treatment: Rehabilitation  THERAPY DIAG:  Pain in right leg  Muscle weakness (generalized)  ONSET DATE: Chronic, ongoing approximately 1 year   SUBJECTIVE:              SUBJECTIVE STATEMENT: Patient reports she thought she was doing really good but today it is feeling a little angry. It is tight feeling. She did take about 16,000 steps on Saturday and didn't have much soreness the day after.   Eval: Patient reports for about a year she has been having pain in the right hamstring that can radiate down past the knee, primarily with driving. She does feel some tightness during the day but driving seems to be the main aggravating factor. She has had PT in the past, and states the dry needling helped for short time, but nothing was long lasting. She states that not driving keeps her pain controlled. If she drives for 30 or more minutes then she will get the pain radiating down the leg, and it could cause tightening while driving shorter distances  PERTINENT HISTORY:  See PMH above  PAIN:  Are you having pain? Yes:  NPRS scale: 4/10 currently, typically 3-4/10 at rest Pain location: Proximal hamstring region, radiating down posterior thigh and occasionally past knee Pain description: Tightening, radiating Aggravating factors: Driving Relieving factors: Not  driving  PRECAUTIONS: None  PATIENT GOALS: Pain relief so she can drive without limitation   OBJECTIVE:  Note: Objective measures were completed at Evaluation unless otherwise noted. PATIENT SURVEYS:  PSFS: 4 Driving: 3 Sitting properly: 5  MUSCLE LENGTH: Bilateral hamstring flexibility limitations  POSTURE:   Grossly WFL  PALPATION: Tenderness noted right piriformis, right mid/lateral hamstring region  LUMBAR ROM:   AROM eval 09/03/2024  Flexion 50% - more limited with pull in hamstrings 75% - continued pull bilateral hamstring  Extension WFL   Right lateral flexion    Left lateral flexion    Right rotation    Left rotation     (Blank rows = not tested)  When completing repeated extensions patient reports she seemed to forget about the right hamstring pain  LOWER EXTREMITY ROM:      Hip PROM grossly WFL and non-painful  LOWER EXTREMITY  MMT:    MMT Right eval Left eval  Hip flexion 4 4  Hip extension 4 4  Hip abduction 4 4  Hip adduction    Hip internal rotation    Hip external rotation    Knee flexion 5 5  Knee extension 5 5  Ankle dorsiflexion    Ankle plantarflexion    Ankle inversion    Ankle eversion     (Blank rows = not tested)  FUNCTIONAL TESTS:  DLLT: 60 deg  GAIT: Assistive device utilized: None Level of assistance: Complete Independence Comments: WFL   TREATMENT [facility] Adult PT Treatment:                                                DATE: 09/10/2024 Prone STM / TPR / IASTM / theragun for right hamstring Prone press-up x 10 Bridge with hamstring curl on stability ball 3 x 10 Forward 8 step-up holding 10# bilaterally 2 x 10 each Dead lift with 30# 3 x 10 Bird dog 2 x 10 each  PATIENT EDUCATION:  Education details: HEP update Person educated: Patient Education method: Explanation, Demonstration, Tactile cues, Verbal cues, Handout Education comprehension: verbalized understanding, returned demonstration, verbal cues required,  tactile cues required, and needs further education  HOME EXERCISE PROGRAM: Access Code: [email or web address]    ASSESSMENT: CLINICAL IMPRESSION: Patient tolerated therapy well with no adverse effects. Therapy continues to use manual therapy for the right hamstring region and progressing her hamstring, hip, and core strengthening with good tolerance. She did arrive reporting increase in her right hamstring tightness but noted improvement following manual therapy. She was able to progress with her hamstring strengthening exercises and continued with her lifting. Updated her HEP to progress posterior chain/core strengthening exercises. Patient would benefit from continued skilled PT to progress mobility and strength in order to reduce pain and maximize functional ability.   Eval: Patient is a 53 y.o. female who was seen today for physical therapy evaluation and treatment for chronic right posterior thigh pain. She does have a history of hamstring strain but this has been going on for about a year so it is unlikely that her hamstring strain is the primary pain generator she is experiencing. She does exhibit neural tension symptoms and possible right lumbar radicular symptoms with the report of radiating pain past the knee and into the lower leg. She does demonstrate a lumbar extension preference. She does have some deficits with her hip and core strength, and did not report any increased pain with hamstring muscle testing this visit. She does have tenders of the right mid/lateral hamstring region and also at the right piriformis.   OBJECTIVE IMPAIRMENTS: decreased activity tolerance, decreased ROM, decreased strength, impaired flexibility, and pain.   ACTIVITY LIMITATIONS: sitting, standing, and locomotion level  PARTICIPATION LIMITATIONS: driving, community activity, and occupation  PERSONAL FACTORS: Fitness, Past/current experiences, and Time since onset of injury/illness/exacerbation are also affecting  patient's functional outcome.    GOALS: Goals reviewed with patient? Yes  SHORT TERM GOALS: Target date: 09/25/2024  Patient will be I with initial HEP in order to progress with therapy. Baseline: HEP provided at eval Goal status: INITIAL  2.  Patient will report right posterior thigh pain </= 3/10 in order to reduce functional limitations Baseline: 7/10 Goal status: INITIAL  LONG TERM GOALS: Target date: 10/23/2024  Patient will be I with  final HEP to maintain progress from PT. Baseline: HEP provided at eval Goal status: INITIAL  2.  Patient will report PSFS >/= 8 in order to indicate improvement in their functional ability. Baseline: 4 Goal status: INITIAL  3.  Patient will demonstrate DLLT </= 30 deg to indicate improvement in core strength and lumbopelvic control for reduce pain with driving and daily tasks Baseline: 60 deg Goal status: INITIAL  4.  Patient will report ability to drive >/= 1 hour not limited by pain in order to improve traveling ability Baseline: patient reports limited to about 30 minutes driving Goal status: INITIAL   PLAN: PT FREQUENCY: 1-2x/week  PT DURATION: 8 weeks  PLANNED INTERVENTIONS: 97164- PT Re-evaluation, 97750- Physical Performance Testing, 97110-Therapeutic exercises, 97530- Therapeutic activity, 97112- Neuromuscular re-education, 97535- Self Care, 02859- Manual therapy, 97116- Gait training, [phone] (1-2 muscles), 20561 (3+ muscles)- Dry Needling, Patient/Family education, Balance training, Stair training, Taping, Joint mobilization, Joint manipulation, Spinal manipulation, Spinal mobilization, Cryotherapy, and Moist heat.  PLAN FOR NEXT SESSION: Review HEP and progress PRN, manual/IASTM/TPDN for the right hamstring and piriformis region, continue with hamstring and sciatic nerve mobility, progress core and hip strengthening, hamstring strengthening    Elaine Daring, PT, DPT, LAT, ATC 09/10/24  10:24 AM Phone: [phone] Fax:  [phone]

## 2024-09-10 NOTE — Patient Instructions (Signed)
 Access Code: [email or web address] URL: [email or web address] Date: 09/10/2024 Prepared by: Elaine Daring  Exercises - Prone Press Up  - 1-2 x daily - 2 sets - 10 reps - Hooklying Active Hamstring Stretch  - 1-2 x daily - 2 sets - 10 reps - 5 seconds hold - Supine Sciatic Nerve Glide  - 1-2 x daily - 2 sets - 10 reps - Bridge  - 1 x daily - 2 sets - 10 reps - 5 seconds hold - Clam with Resistance  - 1 x daily - 2 sets - 15 reps - Bird Dog  - 1 x daily - 2 sets - 10 reps

## 2024-09-13 ENCOUNTER — Encounter: Payer: Self-pay | Admitting: Physical Therapy

## 2024-09-13 ENCOUNTER — Ambulatory Visit (INDEPENDENT_AMBULATORY_CARE_PROVIDER_SITE_OTHER): Admitting: Physical Therapy

## 2024-09-13 ENCOUNTER — Other Ambulatory Visit: Payer: Self-pay

## 2024-09-13 DIAGNOSIS — M79604 Pain in right leg: Secondary | ICD-10-CM

## 2024-09-13 DIAGNOSIS — M6281 Muscle weakness (generalized): Secondary | ICD-10-CM

## 2024-09-13 NOTE — Therapy (Signed)
 OUTPATIENT PHYSICAL THERAPY TREATMENT   Patient Name: Nicole Gonzalez MRN: [id number] DOB:Jun 11, 1971, 53 y.o., female Today's Date: 09/13/2024   END OF SESSION:  PT End of Session - 09/13/24 0850     Visit Number 5    Number of Visits 17    Date for Recertification  10/23/24    Authorization Type BCBS    PT Start Time [phone]    PT Stop Time 0930    PT Time Calculation (min) 44 min    Activity Tolerance Patient tolerated treatment well    Behavior During Therapy WFL for tasks assessed/performed              Past Medical History:  Diagnosis Date   Anemia     better after hysterectomy   Cancer (HCC)    basil cell carcinoma/forehead   Diabetes mellitus without complication (HCC)    DIABETES MELLITUS, GESTATIONAL, HX OF 06/02/2009   Qualifier: Diagnosis of  By: Charlett MD, Apolinar POUR    Diverticulosis    Family history of breast cancer    Family history of genetic disease carrier    father has CDKN2A and ATM mutation   Family history of melanoma    Family history of pancreatic cancer    Fracture of lower leg     fall 2011   GERD (gastroesophageal reflux disease)    History of basal cell carcinoma excision    History of gestational diabetes    x2   HTN (hypertension)    Hyperlipidemia    [id number] 043 trx    Increased frequency of headaches 03/15/2012   Internal hemorrhoids    Obesity    Seizures (HCC)    as an infant d/t fever   Tubular adenoma of colon    UNSPECIFIED TACHYCARDIA 09/01/2010   Qualifier: Diagnosis of  By: Domenica MD, Harlene     Past Surgical History:  Procedure Laterality Date   ABDOMINAL HYSTERECTOMY  06/29/2010    question had abnormal cells on Path, fibroids   BASAL CELL CARCINOMA EXCISION     BREAST BIOPSY Right 2009   COLONOSCOPY WITH PROPOFOL  N/A 01/17/2019   Procedure: COLONOSCOPY WITH PROPOFOL ;  Surgeon: Teressa Toribio SQUIBB, MD;  Location: [facility];  Service: Endoscopy;  Laterality: N/A;   ESOPHAGOGASTRODUODENOSCOPY N/A 09/04/2024   Procedure:  EGD (ESOPHAGOGASTRODUODENOSCOPY);  Surgeon: Wilhelmenia Aloha Raddle., MD;  Location: [facility];  Service: Gastroenterology;  Laterality: N/A;   ESOPHAGOGASTRODUODENOSCOPY (EGD) WITH PROPOFOL  N/A 01/17/2019   Procedure: ESOPHAGOGASTRODUODENOSCOPY (EGD) WITH PROPOFOL ;  Surgeon: Teressa Toribio SQUIBB, MD;  Location: [facility];  Service: Endoscopy;  Laterality: N/A;   EUS N/A 01/17/2019   Procedure: UPPER ENDOSCOPIC ULTRASOUND (EUS) RADIAL;  Surgeon: Teressa Toribio SQUIBB, MD;  Location: [facility];  Service: Endoscopy;  Laterality: N/A;   EUS N/A 09/04/2024   Procedure: ULTRASOUND, UPPER GI TRACT, ENDOSCOPIC;  Surgeon: Wilhelmenia Aloha Raddle., MD;  Location: [facility];  Service: Gastroenterology;  Laterality: N/A;   POLYPECTOMY  01/17/2019   Procedure: POLYPECTOMY;  Surgeon: Teressa Toribio SQUIBB, MD;  Location: [facility];  Service: Endoscopy;;   TONSILLECTOMY     Patient Active Problem List   Diagnosis Date Noted   At high risk for pancreatic cancer 09/04/2024   At risk for cancer 09/04/2024   Erosive gastropathy 09/04/2024   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 09/04/2024   History of colonic polyps    Polyp of ascending colon    Genetic testing 05/23/2018   Monoallelic mutation of CDKN2A gene 05/21/2018  Family history of pancreatic cancer    Family history of breast cancer    Family history of melanoma    Family history of gene mutation 04/06/2018   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   Hyperlipidemia 03/27/2015   Family history of breast cancer in female 03/27/2015   Rhinitis, allergic 10/16/2014   Left maxillary sinusitis 10/16/2014   Prediabetes 09/24/2014   Plantar fasciitis 10/22/2013   Abdominal swelling, mass, or lump felt by patient 03/26/2013   Leg length discrepancy 03/22/2013   Newly diagnosed diabetes (HCC) 03/22/2013   Abdominal pain, chronic, right lower quadrant 03/22/2013   History of basal cell carcinoma excision    Visit for preventive health examination 03/15/2012    Hx of abdominal pain 03/15/2012   Anemia    OBESITY 07/15/2010   Allergic rhinitis 07/15/2010   HYPERGLYCEMIA 07/15/2010   Essential hypertension 06/02/2009   DIABETES MELLITUS, GESTATIONAL, HX OF 06/02/2009   KNEE PAIN 06/03/2008   HYPERLIPIDEMIA 10/09/2007   Esophagitis 10/09/2007   Hematuria 10/09/2007   HIDRADENITIS 10/09/2007    PCP: Charlett Apolinar POUR, MD  REFERRING PROVIDER: Leonce Katz, DO  REFERRING DIAG: Partial hamstring tear, subsequent encounter; Right leg pain; Hamstring tightness of right lower extremity; Chronic right-sided low back pain with right-sided sciatica  Rationale for Evaluation and Treatment: Rehabilitation  THERAPY DIAG:  Pain in right leg  Muscle weakness (generalized)  ONSET DATE: Chronic, ongoing approximately 1 year   SUBJECTIVE:              SUBJECTIVE STATEMENT: Patient reports her hamstring seems to be a little angry in the morning. Once she moves around during the day is seems to improve some.   Eval: Patient reports for about a year she has been having pain in the right hamstring that can radiate down past the knee, primarily with driving. She does feel some tightness during the day but driving seems to be the main aggravating factor. She has had PT in the past, and states the dry needling helped for short time, but nothing was long lasting. She states that not driving keeps her pain controlled. If she drives for 30 or more minutes then she will get the pain radiating down the leg, and it could cause tightening while driving shorter distances  PERTINENT HISTORY:  See PMH above  PAIN:  Are you having pain? Yes:  NPRS scale: 3/10 currently, typically 3-4/10 at rest Pain location: Proximal hamstring region, radiating down posterior thigh and occasionally past knee Pain description: Tightening, radiating Aggravating factors: Driving Relieving factors: Not driving  PRECAUTIONS: None  PATIENT GOALS: Pain relief so she can drive  without limitation   OBJECTIVE:  Note: Objective measures were completed at Evaluation unless otherwise noted. PATIENT SURVEYS:  PSFS: 4 Driving: 3 Sitting properly: 5  MUSCLE LENGTH: Bilateral hamstring flexibility limitations  POSTURE:   Grossly WFL  PALPATION: Tenderness noted right piriformis, right mid/lateral hamstring region  LUMBAR ROM:   AROM eval 09/03/2024  Flexion 50% - more limited with pull in hamstrings 75% - continued pull bilateral hamstring  Extension WFL   Right lateral flexion    Left lateral flexion    Right rotation    Left rotation     (Blank rows = not tested)  When completing repeated extensions patient reports she seemed to forget about the right hamstring pain  LOWER EXTREMITY ROM:      Hip PROM grossly WFL and non-painful  LOWER EXTREMITY MMT:    MMT Right eval Left eval Rt /  Lt 09/13/2024  Hip flexion 4 4   Hip extension 4 4   Hip abduction 4 4   Hip adduction     Hip internal rotation     Hip external rotation     Knee flexion 5 5 5  / 5  Knee extension 5 5   Ankle dorsiflexion     Ankle plantarflexion     Ankle inversion     Ankle eversion      (Blank rows = not tested)  FUNCTIONAL TESTS:  DLLT: 60 deg  GAIT: Assistive device utilized: None Level of assistance: Complete Independence Comments: WFL   TREATMENT [facility] Adult PT Treatment:                                                DATE: 09/13/2024 Recumbent bike L5 x 5 min to improve endurance and workload capacity Supine sciatic nerve floss 3 x 10 right Seated hamstring stretch with foot in chair 3 x 20 sec right Slant board calf stretch 3 x 20 sec Modified thomas stretch 3 x 20 sec right Prone hamstring curl with green 3 x 10 Dead lift with 30# 3 x 10 90-90 alternating foot tap 2 x 10 each, leg extensions x 10 each Bird dog 2 x 10 each  PATIENT EDUCATION:  Education details: HEP  Person educated: Patient Education method: Programmer, multimedia, Demonstration, Actor  cues, Verbal cues Education comprehension: verbalized understanding, returned demonstration, verbal cues required, tactile cues required, and needs further education  HOME EXERCISE PROGRAM: Access Code: [email or web address]    ASSESSMENT: CLINICAL IMPRESSION: Patient tolerated therapy well with no adverse effects. Therapy continued to focus on improving her hip and posterior chain flexibility, and progressing posterior chain and core strengthening. Performed isolated hamstring strengthening with good tolerance and with focus on slow eccentric portion, and she continues to report no pain with her lifting or resistance exercises. Progressed some core stabilization exercise this visit with good tolerance. No changes were made to her HEP. Patient would benefit from continued skilled PT to progress mobility and strength in order to reduce pain and maximize functional ability.   Eval: Patient is a 53 y.o. female who was seen today for physical therapy evaluation and treatment for chronic right posterior thigh pain. She does have a history of hamstring strain but this has been going on for about a year so it is unlikely that her hamstring strain is the primary pain generator she is experiencing. She does exhibit neural tension symptoms and possible right lumbar radicular symptoms with the report of radiating pain past the knee and into the lower leg. She does demonstrate a lumbar extension preference. She does have some deficits with her hip and core strength, and did not report any increased pain with hamstring muscle testing this visit. She does have tenders of the right mid/lateral hamstring region and also at the right piriformis.   OBJECTIVE IMPAIRMENTS: decreased activity tolerance, decreased ROM, decreased strength, impaired flexibility, and pain.   ACTIVITY LIMITATIONS: sitting, standing, and locomotion level  PARTICIPATION LIMITATIONS: driving, community activity, and occupation  PERSONAL FACTORS: Fitness,  Past/current experiences, and Time since onset of injury/illness/exacerbation are also affecting patient's functional outcome.    GOALS: Goals reviewed with patient? Yes  SHORT TERM GOALS: Target date: 09/25/2024  Patient will be I with initial HEP in order to progress with therapy. Baseline: HEP provided  at eval Goal status: INITIAL  2.  Patient will report right posterior thigh pain </= 3/10 in order to reduce functional limitations Baseline: 7/10 Goal status: INITIAL  LONG TERM GOALS: Target date: 10/23/2024  Patient will be I with final HEP to maintain progress from PT. Baseline: HEP provided at eval Goal status: INITIAL  2.  Patient will report PSFS >/= 8 in order to indicate improvement in their functional ability. Baseline: 4 Goal status: INITIAL  3.  Patient will demonstrate DLLT </= 30 deg to indicate improvement in core strength and lumbopelvic control for reduce pain with driving and daily tasks Baseline: 60 deg Goal status: INITIAL  4.  Patient will report ability to drive >/= 1 hour not limited by pain in order to improve traveling ability Baseline: patient reports limited to about 30 minutes driving Goal status: INITIAL   PLAN: PT FREQUENCY: 1-2x/week  PT DURATION: 8 weeks  PLANNED INTERVENTIONS: 97164- PT Re-evaluation, 97750- Physical Performance Testing, 97110-Therapeutic exercises, 97530- Therapeutic activity, 97112- Neuromuscular re-education, 97535- Self Care, 02859- Manual therapy, 97116- Gait training, [phone] (1-2 muscles), 20561 (3+ muscles)- Dry Needling, Patient/Family education, Balance training, Stair training, Taping, Joint mobilization, Joint manipulation, Spinal manipulation, Spinal mobilization, Cryotherapy, and Moist heat.  PLAN FOR NEXT SESSION: Review HEP and progress PRN, manual/IASTM/TPDN for the right hamstring and piriformis region, continue with hamstring and sciatic nerve mobility, progress core and hip strengthening, hamstring  strengthening    Elaine Daring, PT, DPT, LAT, ATC 09/13/24  9:33 AM Phone: [phone] Fax: [phone]

## 2024-09-17 ENCOUNTER — Ambulatory Visit (INDEPENDENT_AMBULATORY_CARE_PROVIDER_SITE_OTHER): Admitting: Physical Therapy

## 2024-09-17 ENCOUNTER — Encounter: Payer: Self-pay | Admitting: Physical Therapy

## 2024-09-17 ENCOUNTER — Other Ambulatory Visit: Payer: Self-pay

## 2024-09-17 DIAGNOSIS — M79604 Pain in right leg: Secondary | ICD-10-CM | POA: Diagnosis not present

## 2024-09-17 DIAGNOSIS — M6281 Muscle weakness (generalized): Secondary | ICD-10-CM | POA: Diagnosis not present

## 2024-09-17 NOTE — Patient Instructions (Signed)
 Access Code: [email or web address] URL: [email or web address] Date: 09/17/2024 Prepared by: Elaine Daring  Exercises - Prone Press Up  - 1-2 x daily - 2 sets - 10 reps - Hooklying Active Hamstring Stretch  - 1-2 x daily - 2 sets - 10 reps - 5 seconds hold - Supine Sciatic Nerve Glide  - 1-2 x daily - 2 sets - 10 reps - Single-Leg Bridge on BOSU  - 1 x daily - 2 sets - 10 reps - Clam with Resistance  - 1 x daily - 2 sets - 15 reps - Bird Dog  - 1 x daily - 2 sets - 10 reps - Standing Hamstring Stretch with Step  - 1 x daily - 10 reps - 5 seconds hold

## 2024-09-17 NOTE — Therapy (Addendum)
 " OUTPATIENT PHYSICAL THERAPY TREATMENT  DISCHARGE   Patient Name: Nicole Gonzalez MRN: [id number] DOB:June 11, 1971, 53 y.o., female Today's Date: 09/17/2024   END OF SESSION:  PT End of Session - 09/17/24 0852     Visit Number 6    Number of Visits 17    Date for Recertification  10/23/24    Authorization Type BCBS    PT Start Time [phone]    PT Stop Time 0930    PT Time Calculation (min) 43 min    Activity Tolerance Patient tolerated treatment well    Behavior During Therapy WFL for tasks assessed/performed               Past Medical History:  Diagnosis Date   Anemia     better after hysterectomy   Cancer (HCC)    basil cell carcinoma/forehead   Diabetes mellitus without complication (HCC)    DIABETES MELLITUS, GESTATIONAL, HX OF 06/02/2009   Qualifier: Diagnosis of  By: Charlett MD, Apolinar POUR    Diverticulosis    Family history of breast cancer    Family history of genetic disease carrier    father has CDKN2A and ATM mutation   Family history of melanoma    Family history of pancreatic cancer    Fracture of lower leg     fall 2011   GERD (gastroesophageal reflux disease)    History of basal cell carcinoma excision    History of gestational diabetes    x2   HTN (hypertension)    Hyperlipidemia    [id number] 043 trx    Increased frequency of headaches 03/15/2012   Internal hemorrhoids    Obesity    Seizures (HCC)    as an infant d/t fever   Tubular adenoma of colon    UNSPECIFIED TACHYCARDIA 09/01/2010   Qualifier: Diagnosis of  By: Domenica MD, Harlene     Past Surgical History:  Procedure Laterality Date   ABDOMINAL HYSTERECTOMY  06/29/2010    question had abnormal cells on Path, fibroids   BASAL CELL CARCINOMA EXCISION     BREAST BIOPSY Right 2009   COLONOSCOPY WITH PROPOFOL  N/A 01/17/2019   Procedure: COLONOSCOPY WITH PROPOFOL ;  Surgeon: Teressa Toribio SQUIBB, MD;  Location: [facility];  Service: Endoscopy;  Laterality: N/A;   ESOPHAGOGASTRODUODENOSCOPY N/A 09/04/2024    Procedure: EGD (ESOPHAGOGASTRODUODENOSCOPY);  Surgeon: Wilhelmenia Aloha Raddle., MD;  Location: [facility];  Service: Gastroenterology;  Laterality: N/A;   ESOPHAGOGASTRODUODENOSCOPY (EGD) WITH PROPOFOL  N/A 01/17/2019   Procedure: ESOPHAGOGASTRODUODENOSCOPY (EGD) WITH PROPOFOL ;  Surgeon: Teressa Toribio SQUIBB, MD;  Location: [facility];  Service: Endoscopy;  Laterality: N/A;   EUS N/A 01/17/2019   Procedure: UPPER ENDOSCOPIC ULTRASOUND (EUS) RADIAL;  Surgeon: Teressa Toribio SQUIBB, MD;  Location: [facility];  Service: Endoscopy;  Laterality: N/A;   EUS N/A 09/04/2024   Procedure: ULTRASOUND, UPPER GI TRACT, ENDOSCOPIC;  Surgeon: Wilhelmenia Aloha Raddle., MD;  Location: [facility];  Service: Gastroenterology;  Laterality: N/A;   POLYPECTOMY  01/17/2019   Procedure: POLYPECTOMY;  Surgeon: Teressa Toribio SQUIBB, MD;  Location: [facility];  Service: Endoscopy;;   TONSILLECTOMY     Patient Active Problem List   Diagnosis Date Noted   At high risk for pancreatic cancer 09/04/2024   At risk for cancer 09/04/2024   Erosive gastropathy 09/04/2024   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 09/04/2024   History of colonic polyps    Polyp of ascending colon    Genetic testing 05/23/2018   Monoallelic mutation of CDKN2A  gene 05/21/2018   Family history of pancreatic cancer    Family history of breast cancer    Family history of melanoma    Family history of gene mutation 04/06/2018   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   Hyperlipidemia 03/27/2015   Family history of breast cancer in female 03/27/2015   Rhinitis, allergic 10/16/2014   Left maxillary sinusitis 10/16/2014   Prediabetes 09/24/2014   Plantar fasciitis 10/22/2013   Abdominal swelling, mass, or lump felt by patient 03/26/2013   Leg length discrepancy 03/22/2013   Newly diagnosed diabetes (HCC) 03/22/2013   Abdominal pain, chronic, right lower quadrant 03/22/2013   History of basal cell carcinoma excision    Visit for preventive health  examination 03/15/2012   Hx of abdominal pain 03/15/2012   Anemia    OBESITY 07/15/2010   Allergic rhinitis 07/15/2010   HYPERGLYCEMIA 07/15/2010   Essential hypertension 06/02/2009   DIABETES MELLITUS, GESTATIONAL, HX OF 06/02/2009   KNEE PAIN 06/03/2008   HYPERLIPIDEMIA 10/09/2007   Esophagitis 10/09/2007   Hematuria 10/09/2007   HIDRADENITIS 10/09/2007    PCP: Charlett Apolinar POUR, MD  REFERRING PROVIDER: Leonce Katz, DO  REFERRING DIAG: Partial hamstring tear, subsequent encounter; Right leg pain; Hamstring tightness of right lower extremity; Chronic right-sided low back pain with right-sided sciatica  Rationale for Evaluation and Treatment: Rehabilitation  THERAPY DIAG:  Pain in right leg  Muscle weakness (generalized)  ONSET DATE: Chronic, ongoing approximately 1 year   SUBJECTIVE:              SUBJECTIVE STATEMENT: Patient reports her right hip was feeling a little wonky where it felt stiff when she went to get up and move around, more in the front of the hip.   Eval: Patient reports for about a year she has been having pain in the right hamstring that can radiate down past the knee, primarily with driving. She does feel some tightness during the day but driving seems to be the main aggravating factor. She has had PT in the past, and states the dry needling helped for short time, but nothing was long lasting. She states that not driving keeps her pain controlled. If she drives for 30 or more minutes then she will get the pain radiating down the leg, and it could cause tightening while driving shorter distances  PERTINENT HISTORY:  See PMH above  PAIN:  Are you having pain? Yes:  NPRS scale: 1/10 currently, typically 3-4/10 at rest Pain location: Proximal hamstring region, radiating down posterior thigh and occasionally past knee Pain description: Tightening, radiating Aggravating factors: Driving Relieving factors: Not driving  PRECAUTIONS: None  PATIENT  GOALS: Pain relief so she can drive without limitation   OBJECTIVE:  Note: Objective measures were completed at Evaluation unless otherwise noted. PATIENT SURVEYS:  PSFS: 4 Driving: 3 Sitting properly: 5  MUSCLE LENGTH: Bilateral hamstring flexibility limitations  POSTURE:   Grossly WFL  PALPATION: Tenderness noted right piriformis, right mid/lateral hamstring region  LUMBAR ROM:   AROM eval 09/03/2024  Flexion 50% - more limited with pull in hamstrings 75% - continued pull bilateral hamstring  Extension WFL   Right lateral flexion    Left lateral flexion    Right rotation    Left rotation     (Blank rows = not tested)  When completing repeated extensions patient reports she seemed to forget about the right hamstring pain  LOWER EXTREMITY ROM:      Hip PROM grossly WFL and non-painful  LOWER EXTREMITY MMT:  MMT Right eval Left eval Rt / Lt 09/13/2024  Hip flexion 4 4   Hip extension 4 4   Hip abduction 4 4   Hip adduction     Hip internal rotation     Hip external rotation     Knee flexion 5 5 5  / 5  Knee extension 5 5   Ankle dorsiflexion     Ankle plantarflexion     Ankle inversion     Ankle eversion      (Blank rows = not tested)  FUNCTIONAL TESTS:  DLLT: 60 deg  GAIT: Assistive device utilized: None Level of assistance: Complete Independence Comments: WFL   TREATMENT [facility] Adult PT Treatment:                                                DATE: 09/17/2024 Recumbent bike L5 x 5 min to improve endurance and workload capacity IASTM for right hamstring Supine sciatic nerve floss 3 x 10 right Slant board calf stretch 3 x 20 sec Standing hamstring stretch with toes on slant board 3 x 15 sec Standing hamstring/sciatic nerve mobilization with foot on stool 10 x 3 sec SL bridge with foot elevated in PF 2 x 10 each Dead lift with 40# 3 x 10  PATIENT EDUCATION:  Education details: HEP  Person educated: Patient Education method: Programmer, Multimedia,  Demonstration, Actor cues, Verbal cues Education comprehension: verbalized understanding, returned demonstration, verbal cues required, tactile cues required, and needs further education  HOME EXERCISE PROGRAM: Access Code: [email or web address]    ASSESSMENT: CLINICAL IMPRESSION: Patient tolerated therapy well with no adverse effects. Therapy focused on improving hamstring/sciatic nerve mobility and progressing her strengthening with good tolerance. She seems to be progressing well in therapy and advancing with her strengthening program without increased pain. She does report feeling the hamstring pulling and muscle working but this is bilaterally with exercises. Updated her HEP this visit to progress stretching and strengthening for home. Patient would benefit from continued skilled PT to progress mobility and strength in order to reduce pain and maximize functional ability.   Eval: Patient is a 53 y.o. female who was seen today for physical therapy evaluation and treatment for chronic right posterior thigh pain. She does have a history of hamstring strain but this has been going on for about a year so it is unlikely that her hamstring strain is the primary pain generator she is experiencing. She does exhibit neural tension symptoms and possible right lumbar radicular symptoms with the report of radiating pain past the knee and into the lower leg. She does demonstrate a lumbar extension preference. She does have some deficits with her hip and core strength, and did not report any increased pain with hamstring muscle testing this visit. She does have tenders of the right mid/lateral hamstring region and also at the right piriformis.   OBJECTIVE IMPAIRMENTS: decreased activity tolerance, decreased ROM, decreased strength, impaired flexibility, and pain.   ACTIVITY LIMITATIONS: sitting, standing, and locomotion level  PARTICIPATION LIMITATIONS: driving, community activity, and occupation  PERSONAL FACTORS:  Fitness, Past/current experiences, and Time since onset of injury/illness/exacerbation are also affecting patient's functional outcome.    GOALS: Goals reviewed with patient? Yes  SHORT TERM GOALS: Target date: 09/25/2024  Patient will be I with initial HEP in order to progress with therapy. Baseline: HEP provided at eval Goal status: INITIAL  2.  Patient will report right posterior thigh pain </= 3/10 in order to reduce functional limitations Baseline: 7/10 Goal status: INITIAL  LONG TERM GOALS: Target date: 10/23/2024  Patient will be I with final HEP to maintain progress from PT. Baseline: HEP provided at eval Goal status: INITIAL  2.  Patient will report PSFS >/= 8 in order to indicate improvement in their functional ability. Baseline: 4 Goal status: INITIAL  3.  Patient will demonstrate DLLT </= 30 deg to indicate improvement in core strength and lumbopelvic control for reduce pain with driving and daily tasks Baseline: 60 deg Goal status: INITIAL  4.  Patient will report ability to drive >/= 1 hour not limited by pain in order to improve traveling ability Baseline: patient reports limited to about 30 minutes driving Goal status: INITIAL   PLAN: PT FREQUENCY: 1-2x/week  PT DURATION: 8 weeks  PLANNED INTERVENTIONS: 97164- PT Re-evaluation, 97750- Physical Performance Testing, 97110-Therapeutic exercises, 97530- Therapeutic activity, 97112- Neuromuscular re-education, 97535- Self Care, 02859- Manual therapy, 97116- Gait training, [phone] (1-2 muscles), 20561 (3+ muscles)- Dry Needling, Patient/Family education, Balance training, Stair training, Taping, Joint mobilization, Joint manipulation, Spinal manipulation, Spinal mobilization, Cryotherapy, and Moist heat.  PLAN FOR NEXT SESSION: Review HEP and progress PRN, manual/IASTM/TPDN for the right hamstring and piriformis region, continue with hamstring and sciatic nerve mobility, progress core and hip strengthening, hamstring  strengthening    Elaine Daring, PT, DPT, LAT, ATC 09/17/24  9:43 AM Phone: [phone] Fax: [phone]   PHYSICAL THERAPY DISCHARGE SUMMARY  Visits from Start of Care: 6  Current functional level related to goals / functional outcomes: See above   Remaining deficits: See above   Education / Equipment: HEP   Patient agrees to discharge. Patient goals were not met. Patient is being discharged due to not returning since the last visit.  Elaine Daring, PT, DPT, LAT, ATC 12/25/2024  1:13 PM Phone: [phone] Fax: [phone]   "

## 2024-09-18 ENCOUNTER — Ambulatory Visit (INDEPENDENT_AMBULATORY_CARE_PROVIDER_SITE_OTHER): Admitting: Orthopaedic Surgery

## 2024-09-18 ENCOUNTER — Other Ambulatory Visit (HOSPITAL_BASED_OUTPATIENT_CLINIC_OR_DEPARTMENT_OTHER): Payer: Self-pay

## 2024-09-18 ENCOUNTER — Ambulatory Visit (HOSPITAL_BASED_OUTPATIENT_CLINIC_OR_DEPARTMENT_OTHER): Payer: Self-pay | Admitting: Orthopaedic Surgery

## 2024-09-18 DIAGNOSIS — S76319A Strain of muscle, fascia and tendon of the posterior muscle group at thigh level, unspecified thigh, initial encounter: Secondary | ICD-10-CM

## 2024-09-18 MED ORDER — ACETAMINOPHEN 500 MG PO TABS
500.0000 mg | ORAL_TABLET | Freq: Three times a day (TID) | ORAL | 0 refills | Status: AC
  Filled 2024-09-18: qty 30, 10d supply, fill #0

## 2024-09-18 MED ORDER — OXYCODONE HCL 5 MG PO TABS
5.0000 mg | ORAL_TABLET | ORAL | 0 refills | Status: AC | PRN
  Filled 2024-09-18: qty 10, 2d supply, fill #0

## 2024-09-18 MED ORDER — ASPIRIN 325 MG PO TBEC
325.0000 mg | DELAYED_RELEASE_TABLET | Freq: Every day | ORAL | 0 refills | Status: AC
  Filled 2024-09-18: qty 14, 14d supply, fill #0

## 2024-09-18 MED ORDER — FLUZONE 0.5 ML IM SUSY
0.5000 mL | PREFILLED_SYRINGE | Freq: Once | INTRAMUSCULAR | 0 refills | Status: AC
  Filled 2024-09-18: qty 0.5, 1d supply, fill #0

## 2024-09-18 MED ORDER — IBUPROFEN 800 MG PO TABS
800.0000 mg | ORAL_TABLET | Freq: Three times a day (TID) | ORAL | 0 refills | Status: AC
  Filled 2024-09-18: qty 30, 10d supply, fill #0

## 2024-09-18 NOTE — Progress Notes (Signed)
 Chief Complaint: Right hip pain     History of Present Illness:    Nicole Gonzalez is a 53 y.o. female presents with right hip pain which has been ongoing for the last year.  She has been seeing Dr. Leonce.  She has been trying physical therapy as well as PRP as well as dry needling.  She has not gotten persistent relief from this.  She is experiencing pain predominantly about the posterior hamstring particularly with driving and putting her foot on the accelerator.  She currently works in adjusting for an Scientist, forensic     PMH/PSH/Family History/Social History/Meds/Allergies:    Past Medical History:  Diagnosis Date   Anemia     better after hysterectomy   Cancer (HCC)    basil cell carcinoma/forehead   Diabetes mellitus without complication (HCC)    DIABETES MELLITUS, GESTATIONAL, HX OF 06/02/2009   Qualifier: Diagnosis of  By: Charlett MD, Apolinar POUR    Diverticulosis    Family history of breast cancer    Family history of genetic disease carrier    father has CDKN2A and ATM mutation   Family history of melanoma    Family history of pancreatic cancer    Fracture of lower leg     fall 2011   GERD (gastroesophageal reflux disease)    History of basal cell carcinoma excision    History of gestational diabetes    x2   HTN (hypertension)    Hyperlipidemia    [id number] 043 trx    Increased frequency of headaches 03/15/2012   Internal hemorrhoids    Obesity    Seizures (HCC)    as an infant d/t fever   Tubular adenoma of colon    UNSPECIFIED TACHYCARDIA 09/01/2010   Qualifier: Diagnosis of  By: Domenica MD, Harlene     Past Surgical History:  Procedure Laterality Date   ABDOMINAL HYSTERECTOMY  06/29/2010    question had abnormal cells on Path, fibroids   BASAL CELL CARCINOMA EXCISION     BREAST BIOPSY Right 2009   COLONOSCOPY WITH PROPOFOL  N/A 01/17/2019   Procedure: COLONOSCOPY WITH PROPOFOL ;  Surgeon: Teressa Toribio SQUIBB, MD;  Location: [facility];  Service: Endoscopy;   Laterality: N/A;   ESOPHAGOGASTRODUODENOSCOPY N/A 09/04/2024   Procedure: EGD (ESOPHAGOGASTRODUODENOSCOPY);  Surgeon: Wilhelmenia Aloha Raddle., MD;  Location: [facility];  Service: Gastroenterology;  Laterality: N/A;   ESOPHAGOGASTRODUODENOSCOPY (EGD) WITH PROPOFOL  N/A 01/17/2019   Procedure: ESOPHAGOGASTRODUODENOSCOPY (EGD) WITH PROPOFOL ;  Surgeon: Teressa Toribio SQUIBB, MD;  Location: [facility];  Service: Endoscopy;  Laterality: N/A;   EUS N/A 01/17/2019   Procedure: UPPER ENDOSCOPIC ULTRASOUND (EUS) RADIAL;  Surgeon: Teressa Toribio SQUIBB, MD;  Location: [facility];  Service: Endoscopy;  Laterality: N/A;   EUS N/A 09/04/2024   Procedure: ULTRASOUND, UPPER GI TRACT, ENDOSCOPIC;  Surgeon: Wilhelmenia Aloha Raddle., MD;  Location: [facility];  Service: Gastroenterology;  Laterality: N/A;   POLYPECTOMY  01/17/2019   Procedure: POLYPECTOMY;  Surgeon: Teressa Toribio SQUIBB, MD;  Location: [facility];  Service: Endoscopy;;   TONSILLECTOMY     Social History   Socioeconomic History   Marital status: Married    Spouse name: Not on file   Number of children: 2   Years of education: Not on file   Highest education level: Not on file  Occupational History   Occupation: Government social research officer: [employer]  Tobacco Use   Smoking status: Never   Smokeless tobacco: Never  Vaping  Use   Vaping status: Never Used  Substance and Sexual Activity   Alcohol use: No   Drug use: No   Sexual activity: Not on file  Other Topics Concern   Not on file  Social History Narrative    Household of 4 -2 children   Son   to college     no pets     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    Nonsmoker married   Social Drivers of Corporate investment banker Strain: Not on file  Food Insecurity: No Food Insecurity (07/26/2023)   Hunger Vital Sign    Worried About Running Out of Food in the Last Year: Never true    Ran Out of Food in the Last Year: Never true  Transportation Needs:  Not on file  Physical Activity: Sufficiently Active (07/26/2023)   Exercise Vital Sign    Days of Exercise per Week: 7 days    Minutes of Exercise per Session: 30 min  Stress: Stress Concern Present (07/26/2023)   [employer] of Occupational Health - Occupational Stress Questionnaire    Feeling of Stress : To some extent  Social Connections: Socially Integrated (07/26/2023)   Social Connection and Isolation Panel    Frequency of Communication with Friends and Family: More than three times a week    Frequency of Social Gatherings with Friends and Family: More than three times a week    Attends Religious Services: More than 4 times per year    Active Member of [employer] or Organizations: Yes    Attends Engineer, structural: More than 4 times per year    Marital Status: Married   Family History  Problem Relation Age of Onset   Melanoma Father        x 3 times, first in 17's   Hypertension Father    Hyperlipidemia Father    Squamous cell carcinoma Father    Colon polyps Father    Heart disease Father    Pancreatic cancer Father 79       CDKN2A muation   Arthritis Brother         Psoriatic   Breast cancer Brother 48       ATM + mutation? or VUS   Diabetes Maternal Grandmother    Hyperlipidemia Maternal Grandmother    Heart disease Maternal Grandmother    Skin cancer Maternal Grandfather    Pancreatic cancer Paternal Grandmother 39       met liver   Heart disease Paternal Grandfather    Skin cancer Cousin    Breast cancer Other    Colon cancer Neg Hx    Stomach cancer Neg Hx    Esophageal cancer Neg Hx    Allergies  Allergen Reactions   Contrast Media [Iodinated Contrast Media] Other (See Comments)    ORAL and IV Fever and itchy mouth ORAL and IV Fever and itchy mouth   Adhesive [Tape]    Barium-Containing Compounds    Other     Adhesive, Metals   Polymyxin B -Trimethoprim  Swelling   Stadol [Butorphanol]    Current Outpatient Medications  Medication Sig  Dispense Refill   acetaminophen (TYLENOL) 500 MG tablet Take 1 tablet (500 mg total) by mouth every 8 (eight) hours for 10 days. 30 tablet 0   aspirin EC 325 MG tablet Take 1 tablet (325 mg total) by mouth daily. 14 tablet 0   ibuprofen (ADVIL) 800 MG tablet Take  1 tablet (800 mg total) by mouth every 8 (eight) hours for 10 days. Please take with food, please alternate with acetaminophen 30 tablet 0   oxyCODONE (ROXICODONE) 5 MG immediate release tablet Take 1 tablet (5 mg total) by mouth every 4 (four) hours as needed for severe pain (pain score 7-10) or breakthrough pain. 10 tablet 0   Cyanocobalamin  (VITAMIN B-12) 1000 MCG SUBL Place 1 tablet (1,000 mcg total) under the tongue daily. 90 tablet 3   glucose blood (CONTOUR NEXT TEST) test strip 100 each by Other route See admin instructions. Use as instructed 100 strip 3   ibuprofen (ADVIL,MOTRIN) 200 MG tablet Take 600 mg by mouth every 8 (eight) hours as needed (pain).     levocetirizine (XYZAL) 5 MG tablet Take 5 mg by mouth every evening.      lisinopril  (ZESTRIL ) 10 MG tablet TAKE 1 TABLET(10 MG) BY MOUTH DAILY FOR HIGH BLOOD PRESSURE 90 tablet 0   LORazepam  (ATIVAN ) 0.5 MG tablet 1-2 tabs 30 - 60 min prior to MRI. Do not drive with this medicine. 4 tablet 0   metFORMIN  (GLUCOPHAGE -XR) 500 MG 24 hr tablet TAKE 4 TABLETS BY MOUTH DAILY 360 tablet 3   methylPREDNISolone  (MEDROL  DOSEPAK) 4 MG TBPK tablet Take 6 tablets on day 1.  Take 5 tablets on day 2.  Take 4 tablets on day 3.  Take 3 tablets on day 4.  Take 2 tablets on day 5.  Take 1 tablet on day 6. (Patient not taking: Reported on 07/30/2024) 21 tablet 0   Microlet Lancets MISC USE TO CHECK BLOOD SUGAR ONE TO TWO TIMES DAILY 100 each 3   montelukast (SINGULAIR) 10 MG tablet Take 10 mg by mouth every evening.      Multiple Vitamin (MULTIVITAMIN WITH MINERALS) TABS tablet Take 1 tablet by mouth daily.     omeprazole  (PRILOSEC) 40 MG capsule Take 1 capsule (40 mg total) by mouth daily. 30  capsule 12   QNASL 80 MCG/ACT AERS Place 2 sprays into the nose every evening.      simvastatin  (ZOCOR ) 40 MG tablet TAKE 1 TABLET(40 MG) BY MOUTH DAILY AT 6 PM 90 tablet 1   tirzepatide  (MOUNJARO ) 12.5 MG/0.5ML Pen Inject 12.5 mg into the skin once a week. 6 mL 3   No current facility-administered medications for this visit.   No results found.  Review of Systems:   A ROS was performed including pertinent positives and negatives as documented in the HPI.  Physical Exam :   Constitutional: NAD and appears stated age Neurological: Alert and oriented Psych: Appropriate affect and cooperative There were no vitals taken for this visit.   Comprehensive Musculoskeletal Exam:    Right hip with tenderness about the ischial tuberosity and posterior hamstring with pain with resisted hamstring curl.  Otherwise walks with a nonantalgic gait.   Imaging:   Xray (3 views right hip): Normal  MRI (right hip): Undersurface insertional hamstring tear   I personally reviewed and interpreted the radiographs.   Assessment and Plan:   53 y.o. female with an undersurface insertional hamstring tear.  At this time she has trialed multiple treatments including physical therapy, PRP, dry needling, physical therapy.  She has not gotten persistent relief from this and as result we did discuss possibility of right hip endoscopic hamstring repair.  I discussed the risks and benefits as well as associated recovery timeframe.  After discussion she would like to proceed  -Plan for right hip endoscopic hamstring repair  After a lengthy discussion of treatment options, including risks, benefits, alternatives, complications of surgical and nonsurgical conservative options, the patient elected surgical repair.   The patient  is aware of the material risks  and complications including, but not limited to injury to adjacent structures, neurovascular injury, infection, numbness, bleeding, implant failure, thermal  burns, stiffness, persistent pain, failure to heal, disease transmission from allograft, need for further surgery, dislocation, anesthetic risks, blood clots, risks of death,and others. The probabilities of surgical success and failure discussed with patient given their particular co-morbidities.The time and nature of expected rehabilitation and recovery was discussed.The patient's questions were all answered preoperatively.  No barriers to understanding were noted. I explained the natural history of the disease process and Rx rationale.  I explained to the patient what I considered to be reasonable expectations given their personal situation.  The final treatment plan was arrived at through a shared patient decision making process model.    I personally saw and evaluated the patient, and participated in the management and treatment plan.  Elspeth Parker, MD Attending Physician, Orthopedic Surgery  This document was dictated using Dragon voice recognition software. A reasonable attempt at proof reading has been made to minimize errors.

## 2024-09-19 ENCOUNTER — Encounter: Admitting: Physical Therapy

## 2024-09-25 ENCOUNTER — Encounter: Admitting: Physical Therapy

## 2024-10-01 ENCOUNTER — Encounter (HOSPITAL_BASED_OUTPATIENT_CLINIC_OR_DEPARTMENT_OTHER): Payer: Self-pay | Admitting: Orthopaedic Surgery

## 2024-10-03 ENCOUNTER — Other Ambulatory Visit: Payer: Self-pay | Admitting: Family

## 2024-10-04 ENCOUNTER — Other Ambulatory Visit: Payer: Self-pay | Admitting: Internal Medicine

## 2024-10-07 NOTE — Telephone Encounter (Signed)
 I spoke with the patient on 10/03/24 and scheduled her surgery for 12/16/24.

## 2024-10-08 ENCOUNTER — Encounter: Payer: Self-pay | Admitting: Internal Medicine

## 2024-10-09 ENCOUNTER — Other Ambulatory Visit: Payer: Self-pay

## 2024-10-09 MED ORDER — LISINOPRIL 10 MG PO TABS: ORAL_TABLET | ORAL | 0 refills | Status: DC

## 2024-10-09 NOTE — Telephone Encounter (Signed)
 Spoke to pt this morning. Rx sent.

## 2024-10-11 ENCOUNTER — Other Ambulatory Visit: Payer: Self-pay | Admitting: Medical Genetics

## 2024-10-11 DIAGNOSIS — Z006 Encounter for examination for normal comparison and control in clinical research program: Secondary | ICD-10-CM

## 2024-10-14 ENCOUNTER — Encounter: Payer: Self-pay | Admitting: Radiology

## 2024-11-15 ENCOUNTER — Telehealth: Admitting: Family Medicine

## 2024-11-15 ENCOUNTER — Encounter

## 2024-11-15 DIAGNOSIS — B9689 Other specified bacterial agents as the cause of diseases classified elsewhere: Secondary | ICD-10-CM | POA: Diagnosis not present

## 2024-11-15 DIAGNOSIS — J208 Acute bronchitis due to other specified organisms: Secondary | ICD-10-CM | POA: Diagnosis not present

## 2024-11-15 MED ORDER — BENZONATATE 200 MG PO CAPS: 200.0000 mg | ORAL_CAPSULE | Freq: Two times a day (BID) | ORAL | 0 refills | Status: AC | PRN

## 2024-11-15 MED ORDER — AZITHROMYCIN 250 MG PO TABS: ORAL_TABLET | ORAL | 0 refills | Status: AC

## 2024-11-15 NOTE — Progress Notes (Signed)
 We are sorry that you are not feeling well.  Here is how we plan to help!  Based on your presentation I believe you most likely have A cough due to bacteria.  When patients have a fever and a productive cough with a change in color or increased sputum production, we are concerned about bacterial bronchitis.  If left untreated it can progress to pneumonia.  If your symptoms do not improve with your treatment plan it is important that you contact your provider.   I have prescribed Azithromyin 250 mg: two tablets now and then one tablet daily for 4 additonal days    In addition you may use A prescription cough medication called Tessalon  Perles 100mg . You may take 1-2 capsules every 8 hours as needed for your cough.  From your responses in the eVisit questionnaire you describe inflammation in the upper respiratory tract which is causing a significant cough.  This is commonly called Bronchitis and has four common causes:   Allergies Viral Infections Acid Reflux Bacterial Infection Allergies, viruses and acid reflux are treated by controlling symptoms or eliminating the cause. An example might be a cough caused by taking certain blood pressure medications. You stop the cough by changing the medication. Another example might be a cough caused by acid reflux. Controlling the reflux helps control the cough.  USE OF BRONCHODILATOR (RESCUE) INHALERS: There is a risk from using your bronchodilator too frequently.  The risk is that over-reliance on a medication which only relaxes the muscles surrounding the breathing tubes can reduce the effectiveness of medications prescribed to reduce swelling and congestion of the tubes themselves.  Although you feel brief relief from the bronchodilator inhaler, your asthma may actually be worsening with the tubes becoming more swollen and filled with mucus.  This can delay other crucial treatments, such as oral steroid medications. If you need to use a bronchodilator inhaler  daily, several times per day, you should discuss this with your provider.  There are probably better treatments that could be used to keep your asthma under control.     HOME CARE Only take medications as instructed by your medical team. Complete the entire course of an antibiotic. Drink plenty of fluids and get plenty of rest. Avoid close contacts especially the very young and the elderly Cover your mouth if you cough or cough into your sleeve. Always remember to wash your hands A steam or ultrasonic humidifier can help congestion.   GET HELP RIGHT AWAY IF: You develop worsening fever. You become short of breath You cough up blood. Your symptoms persist after you have completed your treatment plan MAKE SURE YOU  Understand these instructions. Will watch your condition. Will get help right away if you are not doing well or get worse.  Your e-visit answers were reviewed by a board certified advanced clinical practitioner to complete your personal care plan.  Depending on the condition, your plan could have included both over the counter or prescription medications. If there is a problem please reply  once you have received a response from your provider. Your safety is important to us .  If you have drug allergies check your prescription carefully.    You can use MyChart to ask questions about today's visit, request a non-urgent call back, or ask for a work or school excuse for 24 hours related to this e-Visit. If it has been greater than 24 hours you will need to follow up with your provider, or enter a new e-Visit to  address those concerns. You will get an e-mail in the next two days asking about your experience.  I hope that your e-visit has been valuable and will speed your recovery. Thank you for using e-visits.   I have spent 5 minutes in review of e-visit questionnaire, review and updating patient chart, medical decision making and response to patient.   Mart Colpitts, FNP

## 2024-11-16 ENCOUNTER — Other Ambulatory Visit: Payer: Self-pay | Admitting: Internal Medicine

## 2024-11-21 ENCOUNTER — Ambulatory Visit: Admitting: Internal Medicine

## 2024-11-21 ENCOUNTER — Encounter: Payer: Self-pay | Admitting: Internal Medicine

## 2024-11-21 VITALS — BP 106/70 | HR 78 | Temp 97.9°F | Ht 63.0 in | Wt 187.6 lb

## 2024-11-21 DIAGNOSIS — H6122 Impacted cerumen, left ear: Secondary | ICD-10-CM

## 2024-11-21 DIAGNOSIS — Z79899 Other long term (current) drug therapy: Secondary | ICD-10-CM | POA: Diagnosis not present

## 2024-11-21 DIAGNOSIS — Z7985 Long-term (current) use of injectable non-insulin antidiabetic drugs: Secondary | ICD-10-CM | POA: Diagnosis not present

## 2024-11-21 DIAGNOSIS — Z01818 Encounter for other preprocedural examination: Secondary | ICD-10-CM

## 2024-11-21 DIAGNOSIS — I10 Essential (primary) hypertension: Secondary | ICD-10-CM | POA: Diagnosis not present

## 2024-11-21 DIAGNOSIS — E1165 Type 2 diabetes mellitus with hyperglycemia: Secondary | ICD-10-CM | POA: Diagnosis not present

## 2024-11-21 DIAGNOSIS — J22 Unspecified acute lower respiratory infection: Secondary | ICD-10-CM

## 2024-11-21 LAB — POCT GLYCOSYLATED HEMOGLOBIN (HGB A1C): Hemoglobin A1C: 5.4 % (ref 4.0–5.6)

## 2024-11-21 NOTE — Patient Instructions (Signed)
 Good to see  you today  Chest exam is clear.  A1c is down to 5.4   No change in meds x  hold mounjaro  pre operatively.   Plan fu  in 4 month or thereabouts .  Will send info optimization clearance for surgery  tbd January .

## 2024-11-21 NOTE — Progress Notes (Unsigned)
 Chief Complaint  Patient presents with   Medical Management of Chronic Issues    HPI: Nicole Gonzalez 53 y.o. come in for Chronic disease management  FU mounjaro  inc dose   12.5 weekly weight manamgertn and dm control no sig se  BPHT: doing better no sx of hypotension See above  had resp infection  azithromycin  getting better  but  Left ear  clogged hearing sometimes hurt . No current pain Need preop eval clearance for surgery Jan 5,26 for hamstring repair  to use  gen anesthesia .  Dr Genelle Brother cancer returned ? metastatic breast. No gene identified . Had flu vaccine .   ROS: See pertinent positives and negatives per HPI. No cps sob  neuro sx of import  vomiting bleeding   Past Medical History:  Diagnosis Date   Anemia     better after hysterectomy   Cancer (HCC)    basil cell carcinoma/forehead   Diabetes mellitus without complication (HCC)    DIABETES MELLITUS, GESTATIONAL, HX OF 06/02/2009   Qualifier: Diagnosis of  By: Charlett MD, Apolinar POUR    Diverticulosis    Family history of breast cancer    Family history of genetic disease carrier    father has CDKN2A and ATM mutation   Family history of melanoma    Family history of pancreatic cancer    Fracture of lower leg     fall 2011   GERD (gastroesophageal reflux disease)    History of basal cell carcinoma excision    History of gestational diabetes    x2   HTN (hypertension)    Hyperlipidemia    [id number] trx    Increased frequency of headaches 03/15/2012   Internal hemorrhoids    Obesity    Seizures (HCC)    as an infant d/t fever   Tubular adenoma of colon    UNSPECIFIED TACHYCARDIA 09/01/2010   Qualifier: Diagnosis of  By: Domenica MD, Harlene      Family History  Problem Relation Age of Onset   Melanoma Father        x 3 times, first in 30's   Hypertension Father    Hyperlipidemia Father    Squamous cell carcinoma Father    Colon polyps Father    Heart disease Father    Pancreatic cancer Father 86        CDKN2A muation   Arthritis Brother         Psoriatic   Breast cancer Brother 69       ATM + mutation? or VUS   Diabetes Maternal Grandmother    Hyperlipidemia Maternal Grandmother    Heart disease Maternal Grandmother    Skin cancer Maternal Grandfather    Pancreatic cancer Paternal Grandmother 35       met liver   Heart disease Paternal Grandfather    Skin cancer Cousin    Breast cancer Other    Colon cancer Neg Hx    Stomach cancer Neg Hx    Esophageal cancer Neg Hx     Social History   Socioeconomic History   Marital status: Married    Spouse name: Not on file   Number of children: 2   Years of education: Not on file   Highest education level: Not on file  Occupational History   Occupation: Government Social Research Officer: [employer]  Tobacco Use   Smoking status: Never   Smokeless tobacco: Never  Vaping Use  Vaping status: Never Used  Substance and Sexual Activity   Alcohol use: No   Drug use: No   Sexual activity: Not on file  Other Topics Concern   Not on file  Social History Narrative    Household of 4 -2 children   Son   to college     no pets     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    Nonsmoker married   Social Drivers of Health   Tobacco Use: Low Risk (11/21/2024)   Patient History    Smoking Tobacco Use: Never    Smokeless Tobacco Use: Never    Passive Exposure: Not on file  Financial Resource Strain: Not on file  Food Insecurity: No Food Insecurity (07/26/2023)   Hunger Vital Sign    Worried About Running Out of Food in the Last Year: Never true    Ran Out of Food in the Last Year: Never true  Transportation Needs: Not on file  Physical Activity: Sufficiently Active (07/26/2023)   Exercise Vital Sign    Days of Exercise per Week: 7 days    Minutes of Exercise per Session: 30 min  Stress: Stress Concern Present (07/26/2023)   [employer] of Occupational Health - Occupational Stress  Questionnaire    Feeling of Stress : To some extent  Social Connections: Socially Integrated (07/26/2023)   Social Connection and Isolation Panel    Frequency of Communication with Friends and Family: More than three times a week    Frequency of Social Gatherings with Friends and Family: More than three times a week    Attends Religious Services: More than 4 times per year    Active Member of Clubs or Organizations: Yes    Attends Banker Meetings: More than 4 times per year    Marital Status: Married  Depression (PHQ2-9): Low Risk (07/30/2024)   Depression (PHQ2-9)    PHQ-2 Score: 0  Alcohol Screen: Low Risk (07/26/2023)   Alcohol Screen    Last Alcohol Screening Score (AUDIT): 0  Housing: Low Risk (07/26/2023)   Housing    Last Housing Risk Score: 0  Utilities: Not on file  Health Literacy: Adequate Health Literacy (07/26/2023)   B1300 Health Literacy    Frequency of need for help with medical instructions: Never    Outpatient Medications Prior to Visit  Medication Sig Dispense Refill   benzonatate  (TESSALON ) 200 MG capsule Take 1 capsule (200 mg total) by mouth 2 (two) times daily as needed for cough. 20 capsule 0   CONTOUR NEXT TEST test strip USE TO CHECK BLOOD SUGARS 1-2 TIMES A DAY 100 strip 3   ibuprofen  (ADVIL ,MOTRIN ) 200 MG tablet Take 600 mg by mouth every 8 (eight) hours as needed (pain).     levocetirizine (XYZAL) 5 MG tablet Take 5 mg by mouth every evening.      lisinopril  (ZESTRIL ) 10 MG tablet TAKE 1 TABLET(10 MG) BY MOUTH DAILY FOR HIGH BLOOD PRESSURE 90 tablet 0   metFORMIN  (GLUCOPHAGE -XR) 500 MG 24 hr tablet TAKE 4 TABLETS BY MOUTH DAILY 360 tablet 3   Microlet Lancets MISC USE TO CHECK BLOOD SUGARS ONE TO TWO TIMES DAILY 100 each 3   montelukast (SINGULAIR) 10 MG tablet Take 10 mg by mouth every evening.      Multiple Vitamin (MULTIVITAMIN WITH MINERALS) TABS tablet Take 1 tablet by mouth daily.     omeprazole  (PRILOSEC) 40 MG capsule Take 1 capsule  (  40 mg total) by mouth daily. 30 capsule 12   QNASL 80 MCG/ACT AERS Place 2 sprays into the nose every evening.      simvastatin  (ZOCOR ) 40 MG tablet TAKE 1 TABLET(40 MG) BY MOUTH DAILY AT 6 PM 90 tablet 1   tirzepatide  (MOUNJARO ) 12.5 MG/0.5ML Pen Inject 12.5 mg into the skin once a week. 6 mL 3   aspirin  EC 325 MG tablet Take 1 tablet (325 mg total) by mouth daily. (Patient not taking: Reported on 11/21/2024) 14 tablet 0   Cyanocobalamin  (VITAMIN B-12) 1000 MCG SUBL Place 1 tablet (1,000 mcg total) under the tongue daily. (Patient not taking: Reported on 11/21/2024) 90 tablet 3   LORazepam  (ATIVAN ) 0.5 MG tablet 1-2 tabs 30 - 60 min prior to MRI. Do not drive with this medicine. (Patient not taking: Reported on 11/21/2024) 4 tablet 0   oxyCODONE  (ROXICODONE ) 5 MG immediate release tablet Take 1 tablet (5 mg total) by mouth every 4 (four) hours as needed for severe pain (pain score 7-10) or breakthrough pain. 10 tablet 0   methylPREDNISolone  (MEDROL  DOSEPAK) 4 MG TBPK tablet Take 6 tablets on day 1.  Take 5 tablets on day 2.  Take 4 tablets on day 3.  Take 3 tablets on day 4.  Take 2 tablets on day 5.  Take 1 tablet on day 6. (Patient not taking: Reported on 11/21/2024) 21 tablet 0   No facility-administered medications prior to visit.     EXAM:  BP 106/70 (BP Location: Right Arm, Patient Position: Sitting, Cuff Size: Normal)   Pulse 78   Temp 97.9 F (36.6 C) (Oral)   Ht 5' 3 (1.6 m)   Wt 187 lb 9.6 oz (85.1 kg)   SpO2 95%   BMI 33.23 kg/m   Body mass index is 33.23 kg/m. Wt Readings from Last 3 Encounters:  11/21/24 187 lb 9.6 oz (85.1 kg)  09/04/24 192 lb (87.1 kg)  08/28/24 182 lb (82.6 kg)    GENERAL: vitals reviewed and listed above, alert, oriented, appears well hydrated and in no acute distress mild congestion  HEENT: atraumatic, conjunctiva  clear, no obvious abnormalities on inspection of external nose and ears  left eac soft brown wax noted OP : no lesion edema or  exudate  NECK: no obvious masses on inspection palpation  LUNGS: clear to auscultation bilaterally, no wheezes, rales or rhonchi, good air movement CV: HRRR, no clubbing cyanosis or  peripheral edema nl cap refill  Abdomen:  Sof,t normal bowel sounds without hepatosplenomegaly, no guarding rebound or masses no CVA tenderness  MS: moves all extremities without noticeable focal  abnormality LN no cervical adenopathy  Neuro   grossly non focal normal  Skin no acute bleeding bruising PSYCH: pleasant and cooperative, no obvious depression or anxiety Lab Results  Component Value Date   WBC 6.9 07/30/2024   HGB 14.1 07/30/2024   HCT 43.1 07/30/2024   PLT 241.0 07/30/2024   GLUCOSE 88 07/30/2024   CHOL 176 07/30/2024   TRIG 73.0 07/30/2024   HDL 55.80 07/30/2024   LDLDIRECT 172.4 10/02/2007   LDLCALC 106 (H) 07/30/2024   ALT 31 07/30/2024   AST 19 07/30/2024   NA 138 07/30/2024   K 5.0 07/30/2024   CL 103 07/30/2024   CREATININE 0.60 07/30/2024   BUN 17 07/30/2024   CO2 27 07/30/2024   TSH 1.95 07/30/2024   HGBA1C 6.2 07/30/2024   MICROALBUR <0.7 07/30/2024   BP Readings from Last 3 Encounters:  11/21/24 106/70  09/04/24 105/70  07/30/24 120/88    ASSESSMENT AND PLAN:  Discussed the following assessment and plan:  Type 2 diabetes mellitus with hyperglycemia, without long-term current use of insulin (HCC) - Plan: POC HgB A1c  Medication management  Excessive wax in left ear - clogged feeling  off and on for a month  Lower respiratory infection (e.g., bronchitis, pneumonia, pneumonitis, pulmonitis) - improving no concerning findings Recovering resp infection   continue supportive care . Continue meds for ht and diabetes obesity until pre surgical  doing well  Plan fu in  4 + months  Nicole Gonzalez .  -Patient advised to return or notify health care team  if  new concerns arise.  Patient Instructions  Good to see  you today  Chest exam is clear.  A1c is down to 5.4   No  change in meds x  hold mounjaro  pre operatively.   Plan fu  in 4 month or thereabouts .  Will send info optimization clearance for surgery  tbd January .   Nicole Gonzalez K. Leni Pankonin M.D.

## 2024-11-26 ENCOUNTER — Ambulatory Visit: Admitting: Internal Medicine

## 2024-12-09 ENCOUNTER — Encounter (HOSPITAL_BASED_OUTPATIENT_CLINIC_OR_DEPARTMENT_OTHER): Payer: Self-pay | Admitting: Orthopaedic Surgery

## 2024-12-09 ENCOUNTER — Other Ambulatory Visit: Payer: Self-pay

## 2024-12-11 ENCOUNTER — Encounter (HOSPITAL_BASED_OUTPATIENT_CLINIC_OR_DEPARTMENT_OTHER)
Admission: RE | Admit: 2024-12-11 | Discharge: 2024-12-11 | Disposition: A | Discharge status: Home / Self Care | Source: Ambulatory Visit | Attending: Orthopaedic Surgery | Admitting: Orthopaedic Surgery

## 2024-12-11 DIAGNOSIS — X58XXXA Exposure to other specified factors, initial encounter: Secondary | ICD-10-CM | POA: Diagnosis not present

## 2024-12-11 DIAGNOSIS — Z7984 Long term (current) use of oral hypoglycemic drugs: Secondary | ICD-10-CM | POA: Diagnosis not present

## 2024-12-11 DIAGNOSIS — I10 Essential (primary) hypertension: Secondary | ICD-10-CM | POA: Diagnosis not present

## 2024-12-11 DIAGNOSIS — Z833 Family history of diabetes mellitus: Secondary | ICD-10-CM | POA: Diagnosis not present

## 2024-12-11 DIAGNOSIS — Z79899 Other long term (current) drug therapy: Secondary | ICD-10-CM | POA: Diagnosis not present

## 2024-12-11 DIAGNOSIS — K219 Gastro-esophageal reflux disease without esophagitis: Secondary | ICD-10-CM | POA: Diagnosis not present

## 2024-12-11 DIAGNOSIS — Z7985 Long-term (current) use of injectable non-insulin antidiabetic drugs: Secondary | ICD-10-CM | POA: Diagnosis not present

## 2024-12-11 DIAGNOSIS — S76311A Strain of muscle, fascia and tendon of the posterior muscle group at thigh level, right thigh, initial encounter: Secondary | ICD-10-CM | POA: Diagnosis present

## 2024-12-11 DIAGNOSIS — Z01810 Encounter for preprocedural cardiovascular examination: Secondary | ICD-10-CM | POA: Insufficient documentation

## 2024-12-11 DIAGNOSIS — E119 Type 2 diabetes mellitus without complications: Secondary | ICD-10-CM | POA: Diagnosis not present

## 2024-12-11 DIAGNOSIS — Z8249 Family history of ischemic heart disease and other diseases of the circulatory system: Secondary | ICD-10-CM | POA: Diagnosis not present

## 2024-12-11 LAB — BASIC METABOLIC PANEL WITH GFR
Anion gap: 11 (ref 5–15)
BUN: 15 mg/dL (ref 6–20)
CO2: 24 mmol/L (ref 22–32)
Calcium: 9.5 mg/dL (ref 8.9–10.3)
Chloride: 106 mmol/L (ref 98–111)
Creatinine, Ser: 0.54 mg/dL (ref 0.44–1.00)
GFR, Estimated: >60 mL/min
Glucose, Bld: 101 mg/dL — ABNORMAL HIGH (ref 70–99)
Potassium: 4.2 mmol/L (ref 3.5–5.1)
Sodium: 141 mmol/L (ref 135–145)

## 2024-12-11 NOTE — Progress Notes (Signed)

## 2024-12-16 ENCOUNTER — Encounter (HOSPITAL_BASED_OUTPATIENT_CLINIC_OR_DEPARTMENT_OTHER): Payer: Self-pay | Admitting: Orthopaedic Surgery

## 2024-12-16 ENCOUNTER — Other Ambulatory Visit: Payer: Self-pay

## 2024-12-16 ENCOUNTER — Ambulatory Visit (HOSPITAL_BASED_OUTPATIENT_CLINIC_OR_DEPARTMENT_OTHER)

## 2024-12-16 ENCOUNTER — Ambulatory Visit (HOSPITAL_COMMUNITY)

## 2024-12-16 ENCOUNTER — Encounter (HOSPITAL_BASED_OUTPATIENT_CLINIC_OR_DEPARTMENT_OTHER): Admission: RE | Disposition: A | Payer: Self-pay | Source: Home / Self Care | Attending: Orthopaedic Surgery

## 2024-12-16 ENCOUNTER — Ambulatory Visit (HOSPITAL_BASED_OUTPATIENT_CLINIC_OR_DEPARTMENT_OTHER)
Admission: RE | Admit: 2024-12-16 | Discharge: 2024-12-16 | Disposition: A | Discharge status: Home / Self Care | Attending: Orthopaedic Surgery | Admitting: Orthopaedic Surgery

## 2024-12-16 DIAGNOSIS — E119 Type 2 diabetes mellitus without complications: Secondary | ICD-10-CM | POA: Insufficient documentation

## 2024-12-16 DIAGNOSIS — Z833 Family history of diabetes mellitus: Secondary | ICD-10-CM | POA: Insufficient documentation

## 2024-12-16 DIAGNOSIS — S76319A Strain of muscle, fascia and tendon of the posterior muscle group at thigh level, unspecified thigh, initial encounter: Secondary | ICD-10-CM | POA: Diagnosis not present

## 2024-12-16 DIAGNOSIS — X58XXXA Exposure to other specified factors, initial encounter: Secondary | ICD-10-CM | POA: Insufficient documentation

## 2024-12-16 DIAGNOSIS — Z7984 Long term (current) use of oral hypoglycemic drugs: Secondary | ICD-10-CM | POA: Insufficient documentation

## 2024-12-16 DIAGNOSIS — Z8249 Family history of ischemic heart disease and other diseases of the circulatory system: Secondary | ICD-10-CM | POA: Insufficient documentation

## 2024-12-16 DIAGNOSIS — I10 Essential (primary) hypertension: Secondary | ICD-10-CM | POA: Insufficient documentation

## 2024-12-16 DIAGNOSIS — S76311A Strain of muscle, fascia and tendon of the posterior muscle group at thigh level, right thigh, initial encounter: Secondary | ICD-10-CM | POA: Insufficient documentation

## 2024-12-16 DIAGNOSIS — Z01818 Encounter for other preprocedural examination: Secondary | ICD-10-CM

## 2024-12-16 DIAGNOSIS — Z79899 Other long term (current) drug therapy: Secondary | ICD-10-CM | POA: Insufficient documentation

## 2024-12-16 DIAGNOSIS — Z7985 Long-term (current) use of injectable non-insulin antidiabetic drugs: Secondary | ICD-10-CM | POA: Insufficient documentation

## 2024-12-16 DIAGNOSIS — K219 Gastro-esophageal reflux disease without esophagitis: Secondary | ICD-10-CM | POA: Insufficient documentation

## 2024-12-16 HISTORY — PX: REPAIR QUADRICEPS/HAMSTRING MUSCLES: SHX6563

## 2024-12-16 LAB — GLUCOSE, CAPILLARY
Glucose-Capillary: 97 mg/dL (ref 70–99)
Glucose-Capillary: 106 mg/dL — ABNORMAL HIGH (ref 70–99)

## 2024-12-16 SURGERY — REPAIR, MUSCLE, QUADRICEPS OR HAMSTRING
Anesthesia: General | Site: Hip | Laterality: Right

## 2024-12-16 MED ORDER — CEFAZOLIN SODIUM-DEXTROSE 2-4 GM/100ML-% IV SOLN
INTRAVENOUS | Status: AC
  Filled 2024-12-16: qty 100

## 2024-12-16 MED ORDER — OXYCODONE HCL 5 MG PO TABS: 5.0000 mg | ORAL_TABLET | Freq: Once | ORAL | Status: DC | PRN

## 2024-12-16 MED ORDER — ONDANSETRON HCL 4 MG/2ML IJ SOLN
INTRAMUSCULAR | Status: AC
  Filled 2024-12-16: qty 6

## 2024-12-16 MED ORDER — AMISULPRIDE (ANTIEMETIC) 5 MG/2ML IV SOLN: 10.0000 mg | Freq: Once | INTRAVENOUS | Status: DC | PRN

## 2024-12-16 MED ORDER — ACETAMINOPHEN 500 MG PO TABS
1000.0000 mg | ORAL_TABLET | Freq: Once | ORAL | Status: AC
  Administered 2024-12-16: 1000 mg via ORAL

## 2024-12-16 MED ORDER — BUPIVACAINE HCL (PF) 0.25 % IJ SOLN
INTRAMUSCULAR | Status: DC | PRN
  Administered 2024-12-16: 15 mL

## 2024-12-16 MED ORDER — ATROPINE SULFATE (PF) 0.4 MG/ML IJ SOLN
INTRAMUSCULAR | Status: AC
  Filled 2024-12-16: qty 2

## 2024-12-16 MED ORDER — ACETAMINOPHEN 500 MG PO TABS
ORAL_TABLET | ORAL | Status: AC
  Filled 2024-12-16: qty 2

## 2024-12-16 MED ORDER — DEXMEDETOMIDINE HCL IN NACL 80 MCG/20ML IV SOLN
INTRAVENOUS | Status: AC
  Filled 2024-12-16: qty 40

## 2024-12-16 MED ORDER — VANCOMYCIN HCL 1000 MG IV SOLR
INTRAVENOUS | Status: AC
  Filled 2024-12-16: qty 20

## 2024-12-16 MED ORDER — CEFAZOLIN SODIUM-DEXTROSE 2-4 GM/100ML-% IV SOLN
2.0000 g | INTRAVENOUS | Status: AC
  Administered 2024-12-16: 2 g via INTRAVENOUS

## 2024-12-16 MED ORDER — GABAPENTIN 300 MG PO CAPS
300.0000 mg | ORAL_CAPSULE | Freq: Once | ORAL | Status: AC
  Administered 2024-12-16: 300 mg via ORAL

## 2024-12-16 MED ORDER — PROPOFOL 500 MG/50ML IV EMUL
INTRAVENOUS | Status: AC
  Filled 2024-12-16: qty 100

## 2024-12-16 MED ORDER — HYDROMORPHONE HCL 1 MG/ML IJ SOLN
INTRAMUSCULAR | Status: AC
  Filled 2024-12-16: qty 0.5

## 2024-12-16 MED ORDER — FENTANYL CITRATE (PF) 100 MCG/2ML IJ SOLN
INTRAMUSCULAR | Status: DC | PRN
  Administered 2024-12-16: 50 ug via INTRAVENOUS

## 2024-12-16 MED ORDER — ROCURONIUM BROMIDE 10 MG/ML (PF) SYRINGE
PREFILLED_SYRINGE | INTRAVENOUS | Status: DC | PRN
  Administered 2024-12-16: 60 mg via INTRAVENOUS

## 2024-12-16 MED ORDER — TRANEXAMIC ACID-NACL 1000-0.7 MG/100ML-% IV SOLN
1000.0000 mg | INTRAVENOUS | Status: AC
  Administered 2024-12-16: 1000 mg via INTRAVENOUS

## 2024-12-16 MED ORDER — SODIUM CHLORIDE 0.9 % IR SOLN
Status: DC | PRN
  Administered 2024-12-16: 6000 mL

## 2024-12-16 MED ORDER — LIDOCAINE 2% (20 MG/ML) 5 ML SYRINGE
INTRAMUSCULAR | Status: DC | PRN
  Administered 2024-12-16: 100 mg via INTRAVENOUS

## 2024-12-16 MED ORDER — GABAPENTIN 300 MG PO CAPS
ORAL_CAPSULE | ORAL | Status: AC
  Filled 2024-12-16: qty 1

## 2024-12-16 MED ORDER — HYDROMORPHONE HCL 1 MG/ML IJ SOLN
0.2500 mg | INTRAMUSCULAR | Status: DC | PRN
  Administered 2024-12-16 (×2): 0.5 mg via INTRAVENOUS

## 2024-12-16 MED ORDER — MIDAZOLAM HCL 2 MG/2ML IJ SOLN
INTRAMUSCULAR | Status: AC
  Filled 2024-12-16: qty 2

## 2024-12-16 MED ORDER — ONDANSETRON HCL 4 MG/2ML IJ SOLN: 4.0000 mg | Freq: Once | INTRAMUSCULAR | Status: DC | PRN

## 2024-12-16 MED ORDER — TRANEXAMIC ACID-NACL 1000-0.7 MG/100ML-% IV SOLN
INTRAVENOUS | Status: AC
  Filled 2024-12-16: qty 100

## 2024-12-16 MED ORDER — LACTATED RINGERS IV SOLN: INTRAVENOUS | Status: DC

## 2024-12-16 MED ORDER — OXYCODONE HCL 5 MG/5ML PO SOLN: 5.0000 mg | Freq: Once | ORAL | Status: DC | PRN

## 2024-12-16 MED ORDER — FENTANYL CITRATE (PF) 100 MCG/2ML IJ SOLN
INTRAMUSCULAR | Status: AC
  Filled 2024-12-16: qty 2

## 2024-12-16 MED ORDER — BUPIVACAINE HCL (PF) 0.25 % IJ SOLN
INTRAMUSCULAR | Status: AC
  Filled 2024-12-16: qty 30

## 2024-12-16 MED ORDER — DEXAMETHASONE SODIUM PHOSPHATE 4 MG/ML IJ SOLN
INTRAMUSCULAR | Status: DC | PRN
  Administered 2024-12-16: 4 mg via INTRAVENOUS

## 2024-12-16 MED ORDER — ONDANSETRON HCL 4 MG/2ML IJ SOLN
INTRAMUSCULAR | Status: DC | PRN
  Administered 2024-12-16: 4 mg via INTRAVENOUS

## 2024-12-16 MED ORDER — 0.9 % SODIUM CHLORIDE (POUR BTL) OPTIME
TOPICAL | Status: DC | PRN
  Administered 2024-12-16: 1000 mL

## 2024-12-16 MED ORDER — MIDAZOLAM HCL 5 MG/5ML IJ SOLN
INTRAMUSCULAR | Status: DC | PRN
  Administered 2024-12-16: 2 mg via INTRAVENOUS

## 2024-12-16 MED ORDER — ROCURONIUM BROMIDE 10 MG/ML (PF) SYRINGE
PREFILLED_SYRINGE | INTRAVENOUS | Status: AC
  Filled 2024-12-16: qty 10

## 2024-12-16 MED ORDER — ACETAMINOPHEN 10 MG/ML IV SOLN: 1000.0000 mg | Freq: Once | INTRAVENOUS | Status: DC | PRN

## 2024-12-16 MED ORDER — PHENYLEPHRINE 80 MCG/ML (10ML) SYRINGE FOR IV PUSH (FOR BLOOD PRESSURE SUPPORT)
PREFILLED_SYRINGE | INTRAVENOUS | Status: AC
  Filled 2024-12-16: qty 10

## 2024-12-16 MED ORDER — PROPOFOL 10 MG/ML IV BOLUS
INTRAVENOUS | Status: DC | PRN
  Administered 2024-12-16: 50 mg via INTRAVENOUS
  Administered 2024-12-16: 150 mg via INTRAVENOUS

## 2024-12-16 MED ORDER — LIDOCAINE 2% (20 MG/ML) 5 ML SYRINGE
INTRAMUSCULAR | Status: AC
  Filled 2024-12-16: qty 10

## 2024-12-16 MED ORDER — PHENYLEPHRINE 80 MCG/ML (10ML) SYRINGE FOR IV PUSH (FOR BLOOD PRESSURE SUPPORT)
PREFILLED_SYRINGE | INTRAVENOUS | Status: DC | PRN
  Administered 2024-12-16: 80 ug via INTRAVENOUS

## 2024-12-16 SURGICAL SUPPLY — 59 items
ANCHOR SUT 2.3 DBL 2.0 TAPE (Anchor) IMPLANT
BENZOIN TINCTURE PRP APPL 2/3 (GAUZE/BANDAGES/DRESSINGS) IMPLANT
BIT DRILL 2.3 (BIT) IMPLANT
BIT DRILL 2.3 STR (BIT) IMPLANT
BLADE EXCALIBUR 4.0X13 (MISCELLANEOUS) IMPLANT
BLADE MINI RND TIP GREEN BEAV (BLADE) ×1 IMPLANT
BLADE SURG 15 STRL LF DISP TIS (BLADE) ×1 IMPLANT
BNDG ELASTIC 4INX 5YD STR LF (GAUZE/BANDAGES/DRESSINGS) IMPLANT
BNDG ELASTIC 6INX 5YD STR LF (GAUZE/BANDAGES/DRESSINGS) IMPLANT
BUR SURG 4D 13L RD FLUTE (BUR) IMPLANT
CHLORAPREP W/TINT 26 (MISCELLANEOUS) ×1 IMPLANT
COOLER ICEMAN CLASSIC (MISCELLANEOUS) ×1 IMPLANT
DISSECTOR 3.8MM X 13CM (MISCELLANEOUS) ×1 IMPLANT
DRAPE C-ARM 42X72 X-RAY (DRAPES) ×1 IMPLANT
DRAPE C-ARMOR (DRAPES) IMPLANT
DRAPE IMP U-DRAPE 54X76 (DRAPES) IMPLANT
DRAPE INCISE IOBAN 66X45 STRL (DRAPES) IMPLANT
DRAPE STERI IOBAN 125X83 (DRAPES) ×1 IMPLANT
DRAPE U-SHAPE 47X51 STRL (DRAPES) ×2 IMPLANT
DRSG TEGADERM 4X4.75 (GAUZE/BANDAGES/DRESSINGS) ×3 IMPLANT
ELECTRODE REM PT RTRN 9FT ADLT (ELECTROSURGICAL) IMPLANT
EXCALIBUR 3.8MM X 13CM (MISCELLANEOUS) IMPLANT
FEE RENTAL EQUIP HIP INSTR KIT (INSTRUMENTS) IMPLANT
GAUZE PAD ABD 8X10 STRL (GAUZE/BANDAGES/DRESSINGS) IMPLANT
GAUZE SPONGE 4X4 12PLY STRL (GAUZE/BANDAGES/DRESSINGS) ×1 IMPLANT
GAUZE XEROFORM 1X8 LF (GAUZE/BANDAGES/DRESSINGS) ×1 IMPLANT
GLOVE BIO SURGEON STRL SZ 6 (GLOVE) ×1 IMPLANT
GLOVE BIO SURGEON STRL SZ7.5 (GLOVE) ×2 IMPLANT
GLOVE BIOGEL PI IND STRL 6.5 (GLOVE) ×1 IMPLANT
GLOVE BIOGEL PI IND STRL 8 (GLOVE) ×1 IMPLANT
GOWN STRL REUS W/ TWL LRG LVL3 (GOWN DISPOSABLE) ×1 IMPLANT
GOWN STRL REUS W/ TWL XL LVL3 (GOWN DISPOSABLE) ×1 IMPLANT
INSTRUMENT ORTHO TEXT HIP FEM (INSTRUMENTS) IMPLANT
KIT PORTAL ENTRY HIP ACCESS (KITS) IMPLANT
LASSO 90 CVE QUICKPAS (DISPOSABLE) IMPLANT
MANIFOLD NEPTUNE II (INSTRUMENTS) ×1 IMPLANT
NDL SUT 6 .5 CRC .975X.05 MAYO (NEEDLE) IMPLANT
NEEDLE HYPO 18GX1.5 BLUNT FILL (NEEDLE) IMPLANT
PACK ARTHROSCOPY DSU (CUSTOM PROCEDURE TRAY) ×1 IMPLANT
PACK BASIN DAY SURGERY FS (CUSTOM PROCEDURE TRAY) ×1 IMPLANT
PAD COLD SHLDR WRAP-ON (PAD) ×1 IMPLANT
PADDING CAST COTTON 6X4 STRL (CAST SUPPLIES) IMPLANT
PENCIL SMOKE EVACUATOR (MISCELLANEOUS) IMPLANT
SLEEVE SCD COMPRESS KNEE MED (STOCKING) ×1 IMPLANT
SPONGE T-LAP 18X18 ~~LOC~~+RFID (SPONGE) ×1 IMPLANT
STRIP CLOSURE SKIN 1/2X4 (GAUZE/BANDAGES/DRESSINGS) IMPLANT
SUCTION TUBE FRAZIER 10FR DISP (SUCTIONS) ×1 IMPLANT
SUT MNCRL AB 3-0 PS2 27 (SUTURE) ×1 IMPLANT
SUT VIC AB 0 CT1 27XBRD ANBCTR (SUTURE) IMPLANT
SUT VIC AB 2-0 CT1 TAPERPNT 27 (SUTURE) IMPLANT
SUT XBRAID PASSING LOOP (SUTURE) IMPLANT
SUTURE 0 FIBERLP 38 BLU TPR ND (SUTURE) IMPLANT
SUTURE FIBERWR #2 38 T-5 BLUE (SUTURE) IMPLANT
SUTURE TAPE 1.3 FIBERLOP 20 ST (SUTURE) IMPLANT
TOWEL GREEN STERILE FF (TOWEL DISPOSABLE) ×2 IMPLANT
TUBING ARTHROSCOPY IRRIG 16FT (MISCELLANEOUS) ×1 IMPLANT
WAND ABLATOR APOLLO I90 (BUR) IMPLANT
WAND APOLLO RF 50D ABLATOR (BUR) IMPLANT
YANKAUER SUCT BULB TIP NO VENT (SUCTIONS) ×1 IMPLANT

## 2024-12-16 NOTE — Brief Op Note (Signed)
" ° °  Brief Op Note  Date of Surgery: 12/16/2024  Preoperative Diagnosis: RIGHT HAMSTRING TEAR  Postoperative Diagnosis: same  Procedure: Procedures: RIGHT HIP ENDOSCOPIC HAMSTRING REPAIR  Implants: Implant Name Type Inv. Item Serial No. Manufacturer Lot No. LRB No. Used Action  ANCHOR SUT 2.3 DBL 2.0 TAPE - [id number] Anchor ANCHOR SUT 2.3 DBL 2.0 TAPE  [employer] [id number] Right 1 Implanted    Surgeons: Surgeon(s): Genelle Standing, MD  Anesthesia: General    Estimated Blood Loss: See anesthesia record  Complications: None  Condition to PACU: Stable  Standing LITTIE Genelle, MD 12/16/2024 1:38 PM  "

## 2024-12-16 NOTE — H&P (Signed)
 "     Chief Complaint: Right hip pain        History of Present Illness:      Nicole Gonzalez is a 54 y.o. female presents with right hip pain which has been ongoing for the last year.  She has been seeing Dr. Leonce.  She has been trying physical therapy as well as PRP as well as dry needling.  She has not gotten persistent relief from this.  She is experiencing pain predominantly about the posterior hamstring particularly with driving and putting her foot on the accelerator.  She currently works in adjusting for an scientist, forensic         PMH/PSH/Family History/Social History/Meds/Allergies:         Past Medical History:  Diagnosis Date   Anemia       better after hysterectomy   Cancer (HCC)      basil cell carcinoma/forehead   Diabetes mellitus without complication (HCC)     DIABETES MELLITUS, GESTATIONAL, HX OF 06/02/2009    Qualifier: Diagnosis of  By: Charlett MD, Apolinar POUR    Diverticulosis     Family history of breast cancer     Family history of genetic disease carrier      father has CDKN2A and ATM mutation   Family history of melanoma     Family history of pancreatic cancer     Fracture of lower leg       fall 2011   GERD (gastroesophageal reflux disease)     History of basal cell carcinoma excision     History of gestational diabetes      x2   HTN (hypertension)     Hyperlipidemia      [id number] 043 trx    Increased frequency of headaches 03/15/2012   Internal hemorrhoids     Obesity     Seizures (HCC)      as an infant d/t fever   Tubular adenoma of colon     UNSPECIFIED TACHYCARDIA 09/01/2010    Qualifier: Diagnosis of  By: Domenica MD, Harlene               Past Surgical History:  Procedure Laterality Date   ABDOMINAL HYSTERECTOMY   06/29/2010     question had abnormal cells on Path, fibroids   BASAL CELL CARCINOMA EXCISION       BREAST BIOPSY Right 2009   COLONOSCOPY WITH PROPOFOL  N/A 01/17/2019    Procedure: COLONOSCOPY WITH PROPOFOL ;  Surgeon: Teressa Toribio SQUIBB, MD;  Location: [facility];  Service: Endoscopy;  Laterality: N/A;   ESOPHAGOGASTRODUODENOSCOPY N/A 09/04/2024    Procedure: EGD (ESOPHAGOGASTRODUODENOSCOPY);  Surgeon: Wilhelmenia Aloha Raddle., MD;  Location: [facility];  Service: Gastroenterology;  Laterality: N/A;   ESOPHAGOGASTRODUODENOSCOPY (EGD) WITH PROPOFOL  N/A 01/17/2019    Procedure: ESOPHAGOGASTRODUODENOSCOPY (EGD) WITH PROPOFOL ;  Surgeon: Teressa Toribio SQUIBB, MD;  Location: [facility];  Service: Endoscopy;  Laterality: N/A;   EUS N/A 01/17/2019    Procedure: UPPER ENDOSCOPIC ULTRASOUND (EUS) RADIAL;  Surgeon: Teressa Toribio SQUIBB, MD;  Location: [facility];  Service: Endoscopy;  Laterality: N/A;   EUS N/A 09/04/2024    Procedure: ULTRASOUND, UPPER GI TRACT, ENDOSCOPIC;  Surgeon: Wilhelmenia Aloha Raddle., MD;  Location: [facility];  Service: Gastroenterology;  Laterality: N/A;   POLYPECTOMY   01/17/2019    Procedure: POLYPECTOMY;  Surgeon: Teressa Toribio SQUIBB, MD;  Location: [facility];  Service: Endoscopy;;   TONSILLECTOMY            Social  History         Socioeconomic History   Marital status: Married      Spouse name: Not on file   Number of children: 2   Years of education: Not on file   Highest education level: Not on file  Occupational History   Occupation: Public Affairs Consultant: [employer]  Tobacco Use   Smoking status: Never   Smokeless tobacco: Never  Vaping Use   Vaping status: Never Used  Substance and Sexual Activity   Alcohol use: No   Drug use: No   Sexual activity: Not on file  Other Topics Concern   Not on file  Social History Narrative     Household of 4 -2 children   Son   to college      no pets      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     Nonsmoker married    Social Drivers of Acupuncturist Strain: Not on file  Food Insecurity: No Food Insecurity (07/26/2023)    Hunger Vital Sign     Worried About Running Out of Food  in the Last Year: Never true     Ran Out of Food in the Last Year: Never true  Transportation Needs: Not on file  Physical Activity: Sufficiently Active (07/26/2023)    Exercise Vital Sign     Days of Exercise per Week: 7 days     Minutes of Exercise per Session: 30 min  Stress: Stress Concern Present (07/26/2023)    [employer] of Occupational Health - Occupational Stress Questionnaire     Feeling of Stress : To some extent  Social Connections: Socially Integrated (07/26/2023)    Social Connection and Isolation Panel     Frequency of Communication with Friends and Family: More than three times a week     Frequency of Social Gatherings with Friends and Family: More than three times a week     Attends Religious Services: More than 4 times per year     Active Member of [employer] or Organizations: Yes     Attends Engineer, Structural: More than 4 times per year     Marital Status: Married         Family History  Problem Relation Age of Onset   Melanoma Father          x 3 times, first in 53's   Hypertension Father     Hyperlipidemia Father     Squamous cell carcinoma Father     Colon polyps Father     Heart disease Father     Pancreatic cancer Father 62        CDKN2A muation   Arthritis Brother           Psoriatic   Breast cancer Brother 48        ATM + mutation? or VUS   Diabetes Maternal Grandmother     Hyperlipidemia Maternal Grandmother     Heart disease Maternal Grandmother     Skin cancer Maternal Grandfather     Pancreatic cancer Paternal Grandmother 41        met liver   Heart disease Paternal Grandfather     Skin cancer Cousin     Breast cancer Other     Colon cancer Neg Hx     Stomach cancer Neg  Hx     Esophageal cancer Neg Hx          Allergies       Allergies  Allergen Reactions   Contrast Media [Iodinated Contrast Media] Other (See Comments)      ORAL and IV Fever and itchy mouth ORAL and IV Fever and itchy mouth   Adhesive [Tape]      Barium-Containing Compounds     Other        Adhesive, Metals   Polymyxin B -Trimethoprim  Swelling   Stadol [Butorphanol]              Current Outpatient Medications  Medication Sig Dispense Refill   acetaminophen  (TYLENOL ) 500 MG tablet Take 1 tablet (500 mg total) by mouth every 8 (eight) hours for 10 days. 30 tablet 0   aspirin  EC 325 MG tablet Take 1 tablet (325 mg total) by mouth daily. 14 tablet 0   ibuprofen  (ADVIL ) 800 MG tablet Take 1 tablet (800 mg total) by mouth every 8 (eight) hours for 10 days. Please take with food, please alternate with acetaminophen  30 tablet 0   oxyCODONE  (ROXICODONE ) 5 MG immediate release tablet Take 1 tablet (5 mg total) by mouth every 4 (four) hours as needed for severe pain (pain score 7-10) or breakthrough pain. 10 tablet 0   Cyanocobalamin  (VITAMIN B-12) 1000 MCG SUBL Place 1 tablet (1,000 mcg total) under the tongue daily. 90 tablet 3   glucose blood (CONTOUR NEXT TEST) test strip 100 each by Other route See admin instructions. Use as instructed 100 strip 3   ibuprofen  (ADVIL ,MOTRIN ) 200 MG tablet Take 600 mg by mouth every 8 (eight) hours as needed (pain).       levocetirizine (XYZAL) 5 MG tablet Take 5 mg by mouth every evening.        lisinopril  (ZESTRIL ) 10 MG tablet TAKE 1 TABLET(10 MG) BY MOUTH DAILY FOR HIGH BLOOD PRESSURE 90 tablet 0   LORazepam  (ATIVAN ) 0.5 MG tablet 1-2 tabs 30 - 60 min prior to MRI. Do not drive with this medicine. 4 tablet 0   metFORMIN  (GLUCOPHAGE -XR) 500 MG 24 hr tablet TAKE 4 TABLETS BY MOUTH DAILY 360 tablet 3   methylPREDNISolone  (MEDROL  DOSEPAK) 4 MG TBPK tablet Take 6 tablets on day 1.  Take 5 tablets on day 2.  Take 4 tablets on day 3.  Take 3 tablets on day 4.  Take 2 tablets on day 5.  Take 1 tablet on day 6. (Patient not taking: Reported on 07/30/2024) 21 tablet 0   Microlet Lancets MISC USE TO CHECK BLOOD SUGAR ONE TO TWO TIMES DAILY 100 each 3   montelukast (SINGULAIR) 10 MG tablet Take 10 mg by mouth every  evening.        Multiple Vitamin (MULTIVITAMIN WITH MINERALS) TABS tablet Take 1 tablet by mouth daily.       omeprazole  (PRILOSEC) 40 MG capsule Take 1 capsule (40 mg total) by mouth daily. 30 capsule 12   QNASL 80 MCG/ACT AERS Place 2 sprays into the nose every evening.        simvastatin  (ZOCOR ) 40 MG tablet TAKE 1 TABLET(40 MG) BY MOUTH DAILY AT 6 PM 90 tablet 1   tirzepatide  (MOUNJARO ) 12.5 MG/0.5ML Pen Inject 12.5 mg into the skin once a week. 6 mL 3      No current facility-administered medications for this visit.      Imaging Results (Last 48 hours)  No results found.     Review of  Systems:   A ROS was performed including pertinent positives and negatives as documented in the HPI.   Physical Exam :   Constitutional: NAD and appears stated age Neurological: Alert and oriented Psych: Appropriate affect and cooperative There were no vitals taken for this visit.    Comprehensive Musculoskeletal Exam:     Right hip with tenderness about the ischial tuberosity and posterior hamstring with pain with resisted hamstring curl.  Otherwise walks with a nonantalgic gait.     Imaging:   Xray (3 views right hip): Normal   MRI (right hip): Undersurface insertional hamstring tear     I personally reviewed and interpreted the radiographs.     Assessment and Plan:   54 y.o. female with an undersurface insertional hamstring tear.  At this time she has trialed multiple treatments including physical therapy, PRP, dry needling, physical therapy.  She has not gotten persistent relief from this and as result we did discuss possibility of right hip endoscopic hamstring repair.  I discussed the risks and benefits as well as associated recovery timeframe.  After discussion she would like to proceed   -Plan for right hip endoscopic hamstring repair     After a lengthy discussion of treatment options, including risks, benefits, alternatives, complications of surgical and nonsurgical  conservative options, the patient elected surgical repair.    The patient  is aware of the material risks  and complications including, but not limited to injury to adjacent structures, neurovascular injury, infection, numbness, bleeding, implant failure, thermal burns, stiffness, persistent pain, failure to heal, disease transmission from allograft, need for further surgery, dislocation, anesthetic risks, blood clots, risks of death,and others. The probabilities of surgical success and failure discussed with patient given their particular co-morbidities.The time and nature of expected rehabilitation and recovery was discussed.The patient's questions were all answered preoperatively.  No barriers to understanding were noted. I explained the natural history of the disease process and Rx rationale.  I explained to the patient what I considered to be reasonable expectations given their personal situation.  The final treatment plan was arrived at through a shared patient decision making process model.       I personally saw and evaluated the patient, and participated in the management and treatment plan.   Elspeth Parker, MD Attending Physician, Orthopedic Surgery   This document was dictated using Dragon voice recognition software. A reasonable attempt at proof reading has been made to minimize errors.   "

## 2024-12-16 NOTE — Interval H&P Note (Signed)
 History and Physical Interval Note:  12/16/2024 11:59 AM  Nicole Gonzalez  has presented today for surgery, with the diagnosis of RIGHT HAMSTRING TEAR.  The various methods of treatment have been discussed with the patient and family. After consideration of risks, benefits and other options for treatment, the patient has consented to  Procedures with comments: REPAIR, MUSCLE, QUADRICEPS OR HAMSTRING (Right) - RIGHT ENDOSCOPIC HAMSTRING REPAIR as a surgical intervention.  The patient's history has been reviewed, patient examined, no change in status, stable for surgery.  I have reviewed the patient's chart and labs.  Questions were answered to the patient's satisfaction.     [name]

## 2024-12-16 NOTE — Transfer of Care (Signed)
 Immediate Anesthesia Transfer of Care Note  Patient: Nicole Gonzalez  Procedure(s) Performed: Procedures (LRB): RIGHT HIP ENDOSCOPIC HAMSTRING REPAIR (Right)  Patient Location: PACU  Anesthesia Type: General  Level of Consciousness: awake, oriented, sedated and patient cooperative  Airway & Oxygen Therapy: Patient Spontanous Breathing and Patient connected to face mask oxygen  Post-op Assessment: Report given to PACU RN and Post -op Vital signs reviewed and stable  Post vital signs: Reviewed and stable  Complications: No apparent anesthesia complications Last Vitals:  Vitals Value Taken Time  BP 122/80 12/16/24 13:45  Temp    Pulse 81 12/16/24 13:49  Resp 18 12/16/24 13:49  SpO2 100 % 12/16/24 13:49  Vitals shown include unfiled device data.  Last Pain:  Vitals:   12/16/24 0919  TempSrc: Tympanic  PainSc: 2       Patients Stated Pain Goal: 6 (12/16/24 0919)  Complications: No notable events documented.

## 2024-12-16 NOTE — Anesthesia Preprocedure Evaluation (Addendum)
"                                    Anesthesia Evaluation  Patient identified by MRN, date of birth, ID band Patient awake    Reviewed: Allergy & Precautions, NPO status , Patient's Chart, lab work & pertinent test results  History of Anesthesia Complications Negative for: history of anesthetic complications  Airway Mallampati: II  TM Distance: <3 FB Neck ROM: Full    Dental  (+) Teeth Intact, Dental Advisory Given   Pulmonary Recent URI  ((11/15/24); No Residual Sxs), Resolved   breath sounds clear to auscultation       Cardiovascular hypertension, Pt. on medications  Rhythm:Regular Rate:Normal     Neuro/Psych  Headaches, Seizures - (Hx of Infantile/Febrile Seizures), Well Controlled,     GI/Hepatic ,GERD  Medicated and Controlled,,  Endo/Other  diabetes (Gestational DM), Type 2   GLP1-agonist (last dose 12/06/24)  Renal/GU      Musculoskeletal  Undersurface insertional hamstring tear   Abdominal   Peds  Hematology  (+) Blood dyscrasia, anemia   Anesthesia Other Findings   Reproductive/Obstetrics  S/p Hysterectomy                               Anesthesia Physical Anesthesia Plan  ASA: 2  Anesthesia Plan: General   Post-op Pain Management:    Induction: Intravenous  PONV Risk Score and Plan: 2 and Ondansetron , Dexamethasone  and Treatment may vary due to age or medical condition  Airway Management Planned: LMA and Oral ETT  Additional Equipment: None  Intra-op Plan:   Post-operative Plan: Extubation in OR  Informed Consent: I have reviewed the patients History and Physical, chart, labs and discussed the procedure including the risks, benefits and alternatives for the proposed anesthesia with the patient or authorized representative who has indicated his/her understanding and acceptance.     Dental advisory given  Plan Discussed with: CRNA  Anesthesia Plan Comments:          Anesthesia Quick  Evaluation  "

## 2024-12-16 NOTE — Discharge Instructions (Addendum)
 "    Discharge Instructions    Attending Surgeon: Elspeth Parker, MD Office Phone Number: [phone]   Diagnosis and Procedures:    Surgeries Performed: Right hip proximal hamstring repair  Discharge Plan:    Diet: Resume usual diet. Begin with light or bland foods.  Drink plenty of fluids.  Activity:  Weight bearing as tolerated right leg. You are advised to go home directly from the hospital or surgical center. Restrict your activities.  GENERAL INSTRUCTIONS: 1.  Please apply ice to your wound to help with swelling and inflammation. This will improve your comfort and your overall recovery following surgery.     2. Please call Dr. Danetta office at [phone] with questions Monday-Friday during business hours. If no one answers, please leave a message and someone should get back to the patient within 24 hours. For emergencies please call 911 or proceed to the emergency room.   3. Patient to notify surgical team if experiences any of the following: Bowel/Bladder dysfunction, uncontrolled pain, nerve/muscle weakness, incision with increased drainage or redness, nausea/vomiting and Fever greater than 101.0 F.  Be alert for signs of infection including redness, streaking, odor, fever or chills. Be alert for excessive pain or bleeding and notify your surgeon immediately.  WOUND INSTRUCTIONS:   Leave your dressing, cast, or splint in place until your post operative visit.  Keep it clean and dry.  Always keep the incision clean and dry until the staples/sutures are removed. If there is no drainage from the incision you should keep it open to air. If there is drainage from the incision you must keep it covered at all times until the drainage stops  Do not soak in a bath tub, hot tub, pool, lake or other body of water until 21 days after your surgery and your incision is completely dry and healed.  If you have removable sutures (or staples) they must be removed 10-14 days (unless  otherwise instructed) from the day of your surgery.     1)  Elevate the extremity as much as possible.  2)  Keep the dressing clean and dry.  3)  Please call us  if the dressing becomes wet or dirty.  4)  If you are experiencing worsening pain or worsening swelling, please call.     MEDICATIONS: Resume all previous home medications at the previous prescribed dose and frequency unless otherwise noted Start taking the  pain medications on an as-needed basis as prescribed  Please taper down pain medication over the next week following surgery.  Ideally you should not require a refill of any narcotic pain medication.  Take pain medication with food to minimize nausea. In addition to the prescribed pain medication, you may take over-the-counter pain relievers such as Tylenol .  Do NOT take additional tylenol  if your pain medication already has tylenol  in it.  Aspirin  325mg  daily per instructions on bottle. Narcotic policy: Per [facility] clinic policy, our goal is ensure optimal postoperative pain control with a multimodal pain management strategy. For all [facility] patients, our goal is to wean post-operative narcotic medications by 6 weeks post-operatively, and many times sooner. If this is not possible due to utilization of pain medication prior to surgery, your [facility] doctor will support your acute post-operative pain control for the first 6 weeks postoperatively, with a plan to transition you back to your primary pain team following that. Maralee will work to ensure a therapist, occupational.       FOLLOWUP INSTRUCTIONS: 1. Follow up at the  Physical Therapy Clinic 3-4 days following surgery. This appointment should be scheduled unless other arrangements have been made.The Physical Therapy scheduling number is [phone] if an appointment has not already been arranged.  2. Contact Dr. Danetta office during office hours at [phone] or the practice after hours line at [phone] for  non-emergencies. For medical emergencies call 911.   Discharge Location: Home  No Tylenol  until 3:21pm today, if needed. "

## 2024-12-16 NOTE — Op Note (Signed)
 "  Date of Surgery: 12/16/2024  INDICATIONS: Nicole Gonzalez is a 54 y.o.-year-old female with right proximal hamstring tendon tear.  The risk and benefits of the procedure were discussed in detail and documented in the pre-operative evaluation.   PREOPERATIVE DIAGNOSIS: 1. Right proximal hamstring tendon tear  POSTOPERATIVE DIAGNOSIS: Same.  PROCEDURE: 1. Right hip endoscopic proximal hamstring repair  SURGEON: Nicole LITTIE Parker MD  ASSISTANT: Conley Dawson, ATC  ANESTHESIA:  general  IV FLUIDS AND URINE: See anesthesia record.  ANTIBIOTICS: Ancef   ESTIMATED BLOOD LOSS: 5 mL.  IMPLANTS:  Implant Name Type Inv. Item Serial No. Manufacturer Lot No. LRB No. Used Action  ANCHOR SUT 2.3 DBL 2.0 TAPE - [id number] Anchor ANCHOR SUT 2.3 DBL 2.0 TAPE  [employer] [id number] Right 1 Implanted    DRAINS: None  CULTURES: None  COMPLICATIONS: none  DESCRIPTION OF PROCEDURE:   The patient was identified in the preoperative holding area.  The correct site was marked according to universal protocol.  Antibiotics were given 1 hour prior to skin incision.  The consent was again confirmed and the decision was made to continue with surgery.  The patient was taken back to the operating room.  Anesthesia was induced.  The patient was flipped over into the prone position.  At this time the correct site was prepped and draped in the usual sterile fashion.  Final timeout was performed.  I began by creating a medial/midline portal in the buttocks crease.  This was performed by using a final needle down to the level of the ischial tuberosity.  Trajectory was confirmed an 11 blade was used to incise through skin.  Hemostat was used to spread.  The scope trocar was introduced down to the level of the ischial tuberosity.  Arthroscopy was used to distend the area.  At this time the lateral portal was created with care to respect the sciatic nerve trajectory.  This was done under direct visualization with spinal  needle.  Once I was happy with this 11 blade was used to create the portal and hemostat was used to spread just through skin.  Dannielle was introduced and the ischial tuberosity was cleared off.  Longitudinal split was made through the insertion and confluence of the proximal hamstring tendons.  At this time the central portion was burred in order to create a healthy bleeding surface.  1 double loaded anchor was placed centrally in this midline splint.  A final accessory portal was made centrally between the previous 2 again in line with the crease of the gluteus maximus.  Was used for suture passage and facilitation.  A suture lasso was then placed in between the 2 halves of the tendon in order to create a mattress style repair.  This was done a total of 2 times.  This had excellent opposition of the tendon to bone.  At this time I was happy with the repair.  The wounds were injected with 0.25% Marcaine  approximately 5 cc per portal.  4-0 Monocryl was used to place buried sutures into each of the portals.  Dressings were placed with Xeroform gauze and Tegaderm.  All counts were correct at the end of the case.  Patient was taken the PACU without complication   POSTOPERATIVE PLAN: The patient will be weightbearing as tolerated with hamstring precautions not to bend past 90 degrees between the hip and trunk.  They will be placed on aspirin  325 mg for 2 weeks.  They will begin physical therapy immediately postop.  Nicole LITTIE Parker, MD 1:38 PM    "

## 2024-12-16 NOTE — Anesthesia Procedure Notes (Signed)
 Procedure Name: Intubation Date/Time: 12/16/2024 12:15 PM  Performed by: Leotha Andrez DEL, CRNAPre-anesthesia Checklist: Emergency Drugs available, Patient identified, Suction available, Patient being monitored and Timeout performed Patient Re-evaluated:Patient Re-evaluated prior to induction Oxygen Delivery Method: Circle system utilized Preoxygenation: Pre-oxygenation with 100% oxygen Induction Type: IV induction Ventilation: Mask ventilation without difficulty Laryngoscope Size: Mac and 3 Grade View: Grade I Tube type: Oral Tube size: 7.0 mm Number of attempts: 1 Airway Equipment and Method: Stylet Secured at: 22 cm Tube secured with: Tape Dental Injury: Teeth and Oropharynx as per pre-operative assessment

## 2024-12-16 NOTE — Anesthesia Postprocedure Evaluation (Signed)
"   Anesthesia Post Note  Patient: Nicole Gonzalez  Procedure(s) Performed: RIGHT HIP ENDOSCOPIC HAMSTRING REPAIR (Right: Hip)     Patient location during evaluation: Phase II Anesthesia Type: General Level of consciousness: awake Pain management: pain level controlled Vital Signs Assessment: post-procedure vital signs reviewed and stable Respiratory status: spontaneous breathing Cardiovascular status: blood pressure returned to baseline Postop Assessment: no apparent nausea or vomiting Anesthetic complications: no   No notable events documented.  Last Vitals:  Vitals:   12/16/24 1430 12/16/24 1442  BP: 122/83 104/64  Pulse: 72 74  Resp: 13 16  Temp:  (!) 36.3 C  SpO2: 95% 96%    Last Pain:  Vitals:   12/16/24 1442  TempSrc:   PainSc: 3                  Zaccai Chavarin T Colhoun      "

## 2024-12-17 ENCOUNTER — Encounter (HOSPITAL_BASED_OUTPATIENT_CLINIC_OR_DEPARTMENT_OTHER): Payer: Self-pay | Admitting: Orthopaedic Surgery

## 2024-12-20 ENCOUNTER — Encounter (HOSPITAL_BASED_OUTPATIENT_CLINIC_OR_DEPARTMENT_OTHER): Payer: Self-pay | Admitting: Physical Therapy

## 2024-12-20 ENCOUNTER — Other Ambulatory Visit: Payer: Self-pay

## 2024-12-20 ENCOUNTER — Ambulatory Visit (HOSPITAL_BASED_OUTPATIENT_CLINIC_OR_DEPARTMENT_OTHER): Payer: Self-pay | Attending: Orthopaedic Surgery | Admitting: Physical Therapy

## 2024-12-20 DIAGNOSIS — R2689 Other abnormalities of gait and mobility: Secondary | ICD-10-CM | POA: Insufficient documentation

## 2024-12-20 DIAGNOSIS — M25551 Pain in right hip: Secondary | ICD-10-CM | POA: Insufficient documentation

## 2024-12-20 DIAGNOSIS — M25661 Stiffness of right knee, not elsewhere classified: Secondary | ICD-10-CM | POA: Insufficient documentation

## 2024-12-20 DIAGNOSIS — M6281 Muscle weakness (generalized): Secondary | ICD-10-CM

## 2024-12-20 DIAGNOSIS — M25651 Stiffness of right hip, not elsewhere classified: Secondary | ICD-10-CM | POA: Insufficient documentation

## 2024-12-20 DIAGNOSIS — M2560 Stiffness of unspecified joint, not elsewhere classified: Secondary | ICD-10-CM

## 2024-12-20 DIAGNOSIS — M79604 Pain in right leg: Secondary | ICD-10-CM

## 2024-12-20 DIAGNOSIS — R29898 Other symptoms and signs involving the musculoskeletal system: Secondary | ICD-10-CM

## 2024-12-20 NOTE — Therapy (Signed)
 " OUTPATIENT PHYSICAL THERAPY LOWER EXTREMITY EVALUATION   Patient Name: Nicole Gonzalez MRN: [id number] DOB:05-11-1971, 54 y.o., female Today's Date: 12/22/2024  END OF SESSION:  PT End of Session - 12/22/24 1841     Visit Number 1    Number of Visits 24    Date for Recertification  03/14/25    Authorization Type BCBS    PT Start Time 1516    PT Stop Time 1600    PT Time Calculation (min) 44 min    Activity Tolerance Patient tolerated treatment well    Behavior During Therapy WFL for tasks assessed/performed          Past Medical History:  Diagnosis Date   Anemia     better after hysterectomy   Cancer (HCC)    basil cell carcinoma/forehead   Diabetes mellitus without complication (HCC)    DIABETES MELLITUS, GESTATIONAL, HX OF 06/02/2009   Qualifier: Diagnosis of  By: Charlett MD, Apolinar POUR    Diverticulosis    Family history of breast cancer    Family history of genetic disease carrier    father has CDKN2A and ATM mutation   Family history of melanoma    Family history of pancreatic cancer    Fracture of lower leg     fall 2011   GERD (gastroesophageal reflux disease)    History of basal cell carcinoma excision    History of gestational diabetes    x2   HTN (hypertension)    Hyperlipidemia    [id number] 043 trx    Increased frequency of headaches 03/15/2012   Internal hemorrhoids    Obesity    Seizures (HCC)    as an infant d/t fever   Tubular adenoma of colon    UNSPECIFIED TACHYCARDIA 09/01/2010   Qualifier: Diagnosis of  By: Domenica MD, Harlene     Past Surgical History:  Procedure Laterality Date   ABDOMINAL HYSTERECTOMY  06/29/2010    question had abnormal cells on Path, fibroids   BASAL CELL CARCINOMA EXCISION     BREAST BIOPSY Right 2009   COLONOSCOPY WITH PROPOFOL  N/A 01/17/2019   Procedure: COLONOSCOPY WITH PROPOFOL ;  Surgeon: Teressa Toribio SQUIBB, MD;  Location: [facility];  Service: Endoscopy;  Laterality: N/A;   ESOPHAGOGASTRODUODENOSCOPY N/A 09/04/2024    Procedure: EGD (ESOPHAGOGASTRODUODENOSCOPY);  Surgeon: Wilhelmenia Aloha Raddle., MD;  Location: [facility];  Service: Gastroenterology;  Laterality: N/A;   ESOPHAGOGASTRODUODENOSCOPY (EGD) WITH PROPOFOL  N/A 01/17/2019   Procedure: ESOPHAGOGASTRODUODENOSCOPY (EGD) WITH PROPOFOL ;  Surgeon: Teressa Toribio SQUIBB, MD;  Location: [facility];  Service: Endoscopy;  Laterality: N/A;   EUS N/A 01/17/2019   Procedure: UPPER ENDOSCOPIC ULTRASOUND (EUS) RADIAL;  Surgeon: Teressa Toribio SQUIBB, MD;  Location: [facility];  Service: Endoscopy;  Laterality: N/A;   EUS N/A 09/04/2024   Procedure: ULTRASOUND, UPPER GI TRACT, ENDOSCOPIC;  Surgeon: Wilhelmenia Aloha Raddle., MD;  Location: [facility];  Service: Gastroenterology;  Laterality: N/A;   POLYPECTOMY  01/17/2019   Procedure: POLYPECTOMY;  Surgeon: Teressa Toribio SQUIBB, MD;  Location: [facility];  Service: Endoscopy;;   REPAIR QUADRICEPS/HAMSTRING MUSCLES Right 12/16/2024   Procedure: RIGHT HIP ENDOSCOPIC HAMSTRING REPAIR;  Surgeon: Genelle Standing, MD;  Location: [facility];  Service: Orthopedics;  Laterality: Right;  RIGHT ENDOSCOPIC HAMSTRING REPAIR   TONSILLECTOMY     Patient Active Problem List   Diagnosis Date Noted   Hamstring tear 12/16/2024   At high risk for pancreatic cancer 09/04/2024   At risk for cancer 09/04/2024   Erosive  gastropathy 09/04/2024   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 09/04/2024   History of colonic polyps    Polyp of ascending colon    Genetic testing 05/23/2018   Monoallelic mutation of CDKN2A gene 05/21/2018   Family history of pancreatic cancer    Family history of breast cancer    Family history of melanoma    Family history of gene mutation 04/06/2018   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   Hyperlipidemia 03/27/2015   Family history of breast cancer in female 03/27/2015   Rhinitis, allergic 10/16/2014   Left maxillary sinusitis 10/16/2014   Prediabetes 09/24/2014   Plantar fasciitis 10/22/2013   Abdominal  swelling, mass, or lump felt by patient 03/26/2013   Leg length discrepancy 03/22/2013   Newly diagnosed diabetes (HCC) 03/22/2013   Abdominal pain, chronic, right lower quadrant 03/22/2013   History of basal cell carcinoma excision    Visit for preventive health examination 03/15/2012   Hx of abdominal pain 03/15/2012   Anemia    OBESITY 07/15/2010   Allergic rhinitis 07/15/2010   HYPERGLYCEMIA 07/15/2010   Essential hypertension 06/02/2009   DIABETES MELLITUS, GESTATIONAL, HX OF 06/02/2009   KNEE PAIN 06/03/2008   HYPERLIPIDEMIA 10/09/2007   Esophagitis 10/09/2007   Hematuria 10/09/2007   HIDRADENITIS 10/09/2007    PCP: Apolinar Eastern MD   REFERRING PROVIDER: Genelle Standing, MD  REFERRING DIAG: RIGHT HIP ENDOSCOPIC HAMSTRING REPAIR  THERAPY DIAG:  Stiffness of right hip, not elsewhere classified - Plan: PT plan of care cert/re-cert  Stiffness of right knee, not elsewhere classified - Plan: PT plan of care cert/re-cert  Pain in right hip - Plan: PT plan of care cert/re-cert  Other abnormalities of gait and mobility - Plan: PT plan of care cert/re-cert Genelle Standing, MD Rationale for Evaluation and Treatment: Rehabilitation  ONSET DATE: Surgery on 01/05  Days since surgery: 4  SUBJECTIVE:   SUBJECTIVE STATEMENT: Pt. Had a right hip hamstring repair on 1/5 . Pain and soreness yesterday and this morning. She hasn't had to take pain medication, mostly keeping to the advil  and tylenol . Tried therapy in the past. She also tried PRP, tried everything until getting surgery. Dry needling helped but didn't last, PRP didn't do much.     PERTINENT HISTORY: Had surgery on Monday.  PAIN:  Are you having pain? Yes: NPRS scale: 3 Pain location: back of knee/thigh Pain description: Tightness and bruise. Feel a little twitching and tightness in area. Hasn't been today.  Aggravating factors: 4, mainly towards bruising area Relieving factors: 3  PRECAUTIONS: Posterior  hip  RED FLAGS: None   WEIGHT BEARING RESTRICTIONS: Yes WB as tolerated  FALLS:  Has patient fallen in last 6 months? No  LIVING ENVIRONMENT: Lives with: lives with their family and lives with their spouse Lives in: House/apartment Stairs: Yes: External: 2 steps; none Has following equipment at home: Crutches has been using them to help get off of the toilet. Has handrail in one br. First couple of days using crutches in/out of bed.   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   PLOF: Independent with basic ADLs and Needs assistance with homemaking  PATIENT GOALS: To be able to drive without hurting.   NEXT MD VISIT: 1/16  OBJECTIVE:  Note: Objective measures were completed at Evaluation unless otherwise noted.  DIAGNOSTIC FINDINGS: Fluoroscopy was utilized by the requesting physician.  No radiographic  interpretation.   PATIENT SURVEYS:  LEFS score: 26/80  COGNITION: Overall cognitive status: Within functional limits for tasks assessed     SENSATION: WFL  PALPATION: Swelling present  LOWER EXTREMITY ROM:  Passive ROM Right eval Left eval  Hip flexion 34   Hip extension    Hip abduction    Hip adduction    Hip internal rotation    Hip external rotation    Knee flexion    Knee extension    Ankle dorsiflexion    Ankle plantarflexion    Ankle inversion    Ankle eversion     (Blank rows = not tested)  LOWER EXTREMITY MMT: none indicated due to recent surgery   MMT Right eval Left eval  Hip flexion    Hip extension    Hip abduction    Hip adduction    Hip internal rotation    Hip external rotation    Knee flexion    Knee extension    Ankle dorsiflexion    Ankle plantarflexion    Ankle inversion    Ankle eversion     (Blank rows = not tested) not tested 2nd to recent surgery   LOWER EXTREMITY SPECIAL TESTS: None  indicated   FUNCTIONAL TESTS: None indicated  GAIT: Distance walked: Lobby to tx room ~ 20 ft Assistive device utilized: None Level of assistance: Complete Independence Comments: Dec knee flexion. Only using crutches to help with getting up from toilet at home. Was using to help get in and out of bed.   Pt. Was standing in waiting room and during eval. Unable to sit down. Can lay down.   Mild dec SLS time.                                                                                                                               TREATMENT DATE: 1/9  Redressed wound - healing well.  PROM into flexion Heel slides with strap x 5 Quad press with towel under knee x 10  PATIENT EDUCATION:  Education details: Heel slide education and exercises to do at home Person educated: Patient and Spouse Education method: Medical Illustrator Education comprehension: verbalized understanding and returned demonstration  HOME EXERCISE PROGRAM: Insert code  ASSESSMENT:  CLINICAL IMPRESSION: Patient is a 54 y.o. woman who was seen today for physical therapy evaluation and treatment for post-op hamstring repair.   Pt. Is WNL for post-op surgical protocol. Pt. Is tolerating pain well and is WB well without assistive device. Pt. Will benefit from skilled PT to improve general funtional movility. Patient is unable to sit at this time.    OBJECTIVE IMPAIRMENTS: Abnormal gait, decreased activity tolerance, decreased coordination, decreased endurance, decreased mobility, difficulty walking, decreased ROM, and decreased strength.   ACTIVITY LIMITATIONS: carrying, bending, sitting, squatting, stairs, bed mobility, toileting, dressing, hygiene/grooming, and locomotion level  PARTICIPATION LIMITATIONS: cleaning, laundry, driving, and occupation  PERSONAL FACTORS: Age and Time since onset of  injury/illness/exacerbation are also affecting patient's functional outcome.   REHAB POTENTIAL:  Good  CLINICAL DECISION MAKING: Stable/uncomplicated  EVALUATION COMPLEXITY: Low   GOALS: Goals reviewed with patient? Yes  SHORT TERM GOALS: Target date: 01/06  Hip flex PROM inc to full ROM  Baseline: Goal status: INITIAL  2.  Knee flex PROM inc to full ROM Baseline:  Goal status: INITIAL  3.  Pt. Will be independent with base HEP Baseline:  Goal status: INITIAL  4.  Pt. Will be able to sit for 5 minutes with decreased pain  Baseline:  Goal status: INITIAL   LONG TERM GOALS: Target date: 03/16/2025    Pt. Will be able to bend knee without pain and numbness in order to resume driving responsibilities  Baseline:  Goal status: INITIAL  2.  Pt. Will be able to bend hip to 90 in order to resume work responsibilities Baseline:  Goal status: INITIAL  3.  Pt. Will be able to perform repeated sit to stand without pain in order to meet demands of office environment.  Baseline:  Goal status: INITIAL  PLAN:  PT FREQUENCY: 2x/week  PT DURATION: 12 weeks  PLANNED INTERVENTIONS: 97110-Therapeutic exercises, 97530- Therapeutic activity, 97112- Neuromuscular re-education, 97535- Self Care, 02859- Manual therapy, [phone]- Gait training, [phone]- Aquatic Therapy, Patient/Family education, Joint mobilization, and Scar mobilization, DN  PLAN FOR NEXT SESSION: PROM flexion, heel slide with strap, quad press with towel, self-STM  Nyoka Pearson, SPT   Alm JINNY Don, PT 12/22/2024, 7:00 PM   I have reviewed and concur with this student's documentation.   Alm JINNY Don, PT 12/22/2024 7:00 PM   During this treatment session, the therapist was present, participating in and directing the treatment.  "

## 2024-12-26 ENCOUNTER — Telehealth: Payer: Self-pay

## 2024-12-26 NOTE — Telephone Encounter (Signed)
 Patient would like to know if it is normal to have some circulation issues in her right leg.  Stated no redness and that she has been walking around the house.  Patient had right hip surgery on 12/16/2024.  Has an appt.for 12/27/2024.  CB# [phone].  Please advise.  Thank you.

## 2024-12-27 ENCOUNTER — Ambulatory Visit (HOSPITAL_BASED_OUTPATIENT_CLINIC_OR_DEPARTMENT_OTHER): Payer: Self-pay | Admitting: Physical Therapy

## 2024-12-27 ENCOUNTER — Ambulatory Visit (HOSPITAL_BASED_OUTPATIENT_CLINIC_OR_DEPARTMENT_OTHER): Admitting: Orthopaedic Surgery

## 2024-12-27 ENCOUNTER — Encounter (HOSPITAL_BASED_OUTPATIENT_CLINIC_OR_DEPARTMENT_OTHER): Payer: Self-pay | Admitting: Physical Therapy

## 2024-12-27 DIAGNOSIS — M25651 Stiffness of right hip, not elsewhere classified: Secondary | ICD-10-CM | POA: Diagnosis not present

## 2024-12-27 DIAGNOSIS — M79604 Pain in right leg: Secondary | ICD-10-CM

## 2024-12-27 DIAGNOSIS — M25551 Pain in right hip: Secondary | ICD-10-CM

## 2024-12-27 DIAGNOSIS — M6281 Muscle weakness (generalized): Secondary | ICD-10-CM

## 2024-12-27 DIAGNOSIS — M25661 Stiffness of right knee, not elsewhere classified: Secondary | ICD-10-CM

## 2024-12-27 DIAGNOSIS — S76319A Strain of muscle, fascia and tendon of the posterior muscle group at thigh level, unspecified thigh, initial encounter: Secondary | ICD-10-CM

## 2024-12-27 NOTE — Therapy (Signed)
 " OUTPATIENT PHYSICAL THERAPY LOWER EXTREMITY EVALUATION   Patient Name: Nicole Gonzalez MRN: [id number] DOB:17-May-1971, 54 y.o., female Today's Date: 12/27/2024  END OF SESSION:  PT End of Session - 12/27/24 1522     Visit Number 2    Number of Visits 24    Date for Recertification  03/14/25    Authorization Type BCBS    PT Start Time [phone]    PT Stop Time 0400    PT Time Calculation (min) 38 min    Activity Tolerance Patient tolerated treatment well    Behavior During Therapy WFL for tasks assessed/performed          Past Medical History:  Diagnosis Date   Anemia     better after hysterectomy   Cancer (HCC)    basil cell carcinoma/forehead   Diabetes mellitus without complication (HCC)    DIABETES MELLITUS, GESTATIONAL, HX OF 06/02/2009   Qualifier: Diagnosis of  By: Charlett MD, Apolinar POUR    Diverticulosis    Family history of breast cancer    Family history of genetic disease carrier    father has CDKN2A and ATM mutation   Family history of melanoma    Family history of pancreatic cancer    Fracture of lower leg     fall 2011   GERD (gastroesophageal reflux disease)    History of basal cell carcinoma excision    History of gestational diabetes    x2   HTN (hypertension)    Hyperlipidemia    ldl199 043 trx    Increased frequency of headaches 03/15/2012   Internal hemorrhoids    Obesity    Seizures (HCC)    as an infant d/t fever   Tubular adenoma of colon    UNSPECIFIED TACHYCARDIA 09/01/2010   Qualifier: Diagnosis of  By: Domenica MD, Harlene     Past Surgical History:  Procedure Laterality Date   ABDOMINAL HYSTERECTOMY  06/29/2010    question had abnormal cells on Path, fibroids   BASAL CELL CARCINOMA EXCISION     BREAST BIOPSY Right 2009   COLONOSCOPY WITH PROPOFOL  N/A 01/17/2019   Procedure: COLONOSCOPY WITH PROPOFOL ;  Surgeon: Teressa Toribio SQUIBB, MD;  Location: [facility];  Service: Endoscopy;  Laterality: N/A;   ESOPHAGOGASTRODUODENOSCOPY N/A 09/04/2024    Procedure: EGD (ESOPHAGOGASTRODUODENOSCOPY);  Surgeon: Wilhelmenia Aloha Raddle., MD;  Location: [facility];  Service: Gastroenterology;  Laterality: N/A;   ESOPHAGOGASTRODUODENOSCOPY (EGD) WITH PROPOFOL  N/A 01/17/2019   Procedure: ESOPHAGOGASTRODUODENOSCOPY (EGD) WITH PROPOFOL ;  Surgeon: Teressa Toribio SQUIBB, MD;  Location: [facility];  Service: Endoscopy;  Laterality: N/A;   EUS N/A 01/17/2019   Procedure: UPPER ENDOSCOPIC ULTRASOUND (EUS) RADIAL;  Surgeon: Teressa Toribio SQUIBB, MD;  Location: [facility];  Service: Endoscopy;  Laterality: N/A;   EUS N/A 09/04/2024   Procedure: ULTRASOUND, UPPER GI TRACT, ENDOSCOPIC;  Surgeon: Wilhelmenia Aloha Raddle., MD;  Location: [facility];  Service: Gastroenterology;  Laterality: N/A;   POLYPECTOMY  01/17/2019   Procedure: POLYPECTOMY;  Surgeon: Teressa Toribio SQUIBB, MD;  Location: [facility];  Service: Endoscopy;;   REPAIR QUADRICEPS/HAMSTRING MUSCLES Right 12/16/2024   Procedure: RIGHT HIP ENDOSCOPIC HAMSTRING REPAIR;  Surgeon: Genelle Standing, MD;  Location: [facility];  Service: Orthopedics;  Laterality: Right;  RIGHT ENDOSCOPIC HAMSTRING REPAIR   TONSILLECTOMY     Patient Active Problem List   Diagnosis Date Noted   Hamstring tear 12/16/2024   At high risk for pancreatic cancer 09/04/2024   At risk for cancer 09/04/2024   Erosive  gastropathy 09/04/2024   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 09/04/2024   History of colonic polyps    Polyp of ascending colon    Genetic testing 05/23/2018   Monoallelic mutation of CDKN2A gene 05/21/2018   Family history of pancreatic cancer    Family history of breast cancer    Family history of melanoma    Family history of gene mutation 04/06/2018   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   Hyperlipidemia 03/27/2015   Family history of breast cancer in female 03/27/2015   Rhinitis, allergic 10/16/2014   Left maxillary sinusitis 10/16/2014   Prediabetes 09/24/2014   Plantar fasciitis 10/22/2013   Abdominal  swelling, mass, or lump felt by patient 03/26/2013   Leg length discrepancy 03/22/2013   Newly diagnosed diabetes (HCC) 03/22/2013   Abdominal pain, chronic, right lower quadrant 03/22/2013   History of basal cell carcinoma excision    Visit for preventive health examination 03/15/2012   Hx of abdominal pain 03/15/2012   Anemia    OBESITY 07/15/2010   Allergic rhinitis 07/15/2010   HYPERGLYCEMIA 07/15/2010   Essential hypertension 06/02/2009   DIABETES MELLITUS, GESTATIONAL, HX OF 06/02/2009   KNEE PAIN 06/03/2008   HYPERLIPIDEMIA 10/09/2007   Esophagitis 10/09/2007   Hematuria 10/09/2007   HIDRADENITIS 10/09/2007    PCP: Apolinar Eastern MD   REFERRING PROVIDER: Genelle Standing, MD  REFERRING DIAG: RIGHT HIP ENDOSCOPIC HAMSTRING REPAIR  THERAPY DIAG:  Stiffness of right hip, not elsewhere classified  Stiffness of right knee, not elsewhere classified  Pain in right hip  Pain in right leg  Muscle weakness (generalized) Genelle Standing, MD Rationale for Evaluation and Treatment: Rehabilitation  ONSET DATE: Surgery on 01/05  Days since surgery: 11  SUBJECTIVE:   SUBJECTIVE STATEMENT: Pt is doing well post hamstring repair and says pain has been tolerable. Saw Dr. Genelle today and leg seems to be healing well.   Pt. Had a right hip hamstring repair on 1/5 . Pain and soreness yesterday and this morning. She hasn't had to take pain medication, mostly keeping to the advil  and tylenol . Tried therapy in the past. She also tried PRP, tried everything until getting surgery. Dry needling helped but didn't last, PRP didn't do much.     PERTINENT HISTORY: Had surgery on Monday.  PAIN:  Are you having pain? Yes: NPRS scale: 3 Pain location: back of knee/thigh Pain description: Tightness and bruise. Feel a little twitching and tightness in area. Hasn't been today.  Aggravating factors: 4, mainly towards bruising area Relieving factors: 3  PRECAUTIONS: Posterior hip  RED  FLAGS: None   WEIGHT BEARING RESTRICTIONS: Yes WB as tolerated  FALLS:  Has patient fallen in last 6 months? No  LIVING ENVIRONMENT: Lives with: lives with their family and lives with their spouse Lives in: House/apartment Stairs: Yes: External: 2 steps; none Has following equipment at home: Crutches has been using them to help get off of the toilet. Has handrail in one br. First couple of days using crutches in/out of bed.   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   PLOF: Independent with basic ADLs and Needs assistance with homemaking  PATIENT GOALS: To be able to drive without hurting.   NEXT MD VISIT: 1/16  OBJECTIVE:  Note: Objective measures were completed at Evaluation unless otherwise noted.  DIAGNOSTIC FINDINGS: Fluoroscopy was utilized by the requesting physician.  No radiographic  interpretation.   PATIENT SURVEYS:  LEFS score: 26/80  COGNITION: Overall cognitive status: Within functional limits for tasks assessed     SENSATION: WFL  PALPATION: Swelling present  LOWER EXTREMITY ROM:  Passive ROM Right eval Left eval  Hip flexion 34   Hip extension    Hip abduction    Hip adduction    Hip internal rotation    Hip external rotation    Knee flexion    Knee extension    Ankle dorsiflexion    Ankle plantarflexion    Ankle inversion    Ankle eversion     (Blank rows = not tested)  LOWER EXTREMITY MMT: none indicated due to recent surgery   MMT Right eval Left eval  Hip flexion    Hip extension    Hip abduction    Hip adduction    Hip internal rotation    Hip external rotation    Knee flexion    Knee extension    Ankle dorsiflexion    Ankle plantarflexion    Ankle inversion    Ankle eversion     (Blank rows = not tested) not tested 2nd to recent surgery   LOWER EXTREMITY SPECIAL TESTS: None indicated   FUNCTIONAL  TESTS: None indicated  GAIT: Distance walked: Lobby to tx room ~ 20 ft Assistive device utilized: None Level of assistance: Complete Independence Comments: Dec knee flexion. Only using crutches to help with getting up from toilet at home. Was using to help get in and out of bed.   Pt. Was standing in waiting room and during eval. Unable to sit down. Can lay down.   Mild dec SLS time.                                                                                                                               TREATMENT DATE: 1/9  1/16 PROM into flexion - pt is reaching 60-70 Quad press with ball under knee 3x12 Bent knee fallouts - limited range 3x12   Eval:  Redressed wound - healing well.  PROM into flexion Heel slides with strap x 5 Quad press with towel under knee x 10  PATIENT EDUCATION:  Education details: Heel slide education and exercises to do at home Person educated: Patient and Spouse Education method: Medical Illustrator Education comprehension: verbalized understanding and returned demonstration  HOME EXERCISE PROGRAM: Insert code  ASSESSMENT:  CLINICAL IMPRESSION: Pt presented today 2 weeks s/p hamstring repair. Pt is tolerating protocol well and is making progress in limited range. Pt tolerated exercises well. PT explained that progress for the first few weeks will be slow and pt verbalized understanding. Pt will benefit from PT to get back to ADLs and receive pt ed for activity modifications/limitations.   Patient is a 54 y.o. woman who was seen today for physical therapy evaluation and treatment for post-op hamstring repair.   Pt. Is WNL for post-op surgical protocol. Pt. Is  tolerating pain well and is WB well without assistive device. Pt. Will benefit from skilled PT to improve general funtional movility. Patient is unable to sit at this time.    OBJECTIVE IMPAIRMENTS: Abnormal gait, decreased activity tolerance, decreased coordination, decreased  endurance, decreased mobility, difficulty walking, decreased ROM, and decreased strength.   ACTIVITY LIMITATIONS: carrying, bending, sitting, squatting, stairs, bed mobility, toileting, dressing, hygiene/grooming, and locomotion level  PARTICIPATION LIMITATIONS: cleaning, laundry, driving, and occupation  PERSONAL FACTORS: Age and Time since onset of injury/illness/exacerbation are also affecting patient's functional outcome.   REHAB POTENTIAL: Good  CLINICAL DECISION MAKING: Stable/uncomplicated  EVALUATION COMPLEXITY: Low   GOALS: Goals reviewed with patient? Yes  SHORT TERM GOALS: Target date: 01/06  Hip flex PROM inc to full ROM  Baseline: Goal status: INITIAL  2.  Knee flex PROM inc to full ROM Baseline:  Goal status: INITIAL  3.  Pt. Will be independent with base HEP Baseline:  Goal status: INITIAL  4.  Pt. Will be able to sit for 5 minutes with decreased pain  Baseline:  Goal status: INITIAL   LONG TERM GOALS: Target date: 03/16/2025    Pt. Will be able to bend knee without pain and numbness in order to resume driving responsibilities  Baseline:  Goal status: INITIAL  2.  Pt. Will be able to bend hip to 90 in order to resume work responsibilities Baseline:  Goal status: INITIAL  3.  Pt. Will be able to perform repeated sit to stand without pain in order to meet demands of office environment.  Baseline:  Goal status: INITIAL  PLAN:  PT FREQUENCY: 2x/week  PT DURATION: 12 weeks  PLANNED INTERVENTIONS: 97110-Therapeutic exercises, 97530- Therapeutic activity, 97112- Neuromuscular re-education, 97535- Self Care, 02859- Manual therapy, [phone]- Gait training, [phone]- Aquatic Therapy, Patient/Family education, Joint mobilization, and Scar mobilization, DN  PLAN FOR NEXT SESSION: PROM flexion, heel slide with strap, quad press with towel, self-STM  Nyoka Pearson, SPT   Nyoka Pearson, Student-PT 12/27/2024, 3:23 PM   I have reviewed and concur with this  student's documentation.   Eulia Hatcher O'Shea, Student-PT 12/27/2024 3:23 PM   During this treatment session, the therapist was present, participating in and directing the treatment.  "

## 2024-12-27 NOTE — Progress Notes (Signed)
 "                                Post Operative Evaluation    Procedure/Date of Surgery: Right endoscopic proximal hamstring repair 1/5  Interval History:    Presents 2 weeks status post above procedure.  Overall she doing extremely well.  She has been compliant with her 90 degrees hip thigh angle.  She is walking now without any assistive devices   PMH/PSH/Family History/Social History/Meds/Allergies:    Past Medical History:  Diagnosis Date   Anemia     better after hysterectomy   Cancer (HCC)    basil cell carcinoma/forehead   Diabetes mellitus without complication (HCC)    DIABETES MELLITUS, GESTATIONAL, HX OF 06/02/2009   Qualifier: Diagnosis of  By: Charlett MD, Apolinar POUR    Diverticulosis    Family history of breast cancer    Family history of genetic disease carrier    father has CDKN2A and ATM mutation   Family history of melanoma    Family history of pancreatic cancer    Fracture of lower leg     fall 2011   GERD (gastroesophageal reflux disease)    History of basal cell carcinoma excision    History of gestational diabetes    x2   HTN (hypertension)    Hyperlipidemia    [id number] 043 trx    Increased frequency of headaches 03/15/2012   Internal hemorrhoids    Obesity    Seizures (HCC)    as an infant d/t fever   Tubular adenoma of colon    UNSPECIFIED TACHYCARDIA 09/01/2010   Qualifier: Diagnosis of  By: Domenica MD, Harlene     Past Surgical History:  Procedure Laterality Date   ABDOMINAL HYSTERECTOMY  06/29/2010    question had abnormal cells on Path, fibroids   BASAL CELL CARCINOMA EXCISION     BREAST BIOPSY Right 2009   COLONOSCOPY WITH PROPOFOL  N/A 01/17/2019   Procedure: COLONOSCOPY WITH PROPOFOL ;  Surgeon: Teressa Toribio SQUIBB, MD;  Location: [facility];  Service: Endoscopy;  Laterality: N/A;   ESOPHAGOGASTRODUODENOSCOPY N/A 09/04/2024   Procedure: EGD (ESOPHAGOGASTRODUODENOSCOPY);  Surgeon: Wilhelmenia Aloha Raddle., MD;  Location: [facility];  Service:  Gastroenterology;  Laterality: N/A;   ESOPHAGOGASTRODUODENOSCOPY (EGD) WITH PROPOFOL  N/A 01/17/2019   Procedure: ESOPHAGOGASTRODUODENOSCOPY (EGD) WITH PROPOFOL ;  Surgeon: Teressa Toribio SQUIBB, MD;  Location: [facility];  Service: Endoscopy;  Laterality: N/A;   EUS N/A 01/17/2019   Procedure: UPPER ENDOSCOPIC ULTRASOUND (EUS) RADIAL;  Surgeon: Teressa Toribio SQUIBB, MD;  Location: [facility];  Service: Endoscopy;  Laterality: N/A;   EUS N/A 09/04/2024   Procedure: ULTRASOUND, UPPER GI TRACT, ENDOSCOPIC;  Surgeon: Wilhelmenia Aloha Raddle., MD;  Location: [facility];  Service: Gastroenterology;  Laterality: N/A;   POLYPECTOMY  01/17/2019   Procedure: POLYPECTOMY;  Surgeon: Teressa Toribio SQUIBB, MD;  Location: [facility];  Service: Endoscopy;;   REPAIR QUADRICEPS/HAMSTRING MUSCLES Right 12/16/2024   Procedure: RIGHT HIP ENDOSCOPIC HAMSTRING REPAIR;  Surgeon: Genelle Standing, MD;  [facility];  Service: Orthopedics;  Laterality: Right;  RIGHT ENDOSCOPIC HAMSTRING REPAIR   TONSILLECTOMY     Social History   Socioeconomic History   Marital status: Married    Spouse name: Not on file   Number of children: 2   Years of education: Not on file   Highest education level: Not on file  Occupational History   Occupation: insurance underwriter  Employer: [employer]  Tobacco Use   Smoking status: Never   Smokeless tobacco: Never  Vaping Use   Vaping status: Never Used  Substance and Sexual Activity   Alcohol use: No   Drug use: No   Sexual activity: Not on file  Other Topics Concern   Not on file  Social History Narrative    Household of 4 -2 children   Son   to college     no pets     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    Nonsmoker married   Social Drivers of Health   Tobacco Use: Low Risk (12/27/2024)   Patient History    Smoking Tobacco Use: Never    Smokeless Tobacco Use: Never    Passive Exposure: Not on file  Financial Resource  Strain: Not on file  Food Insecurity: No Food Insecurity (07/26/2023)   Hunger Vital Sign    Worried About Running Out of Food in the Last Year: Never true    Ran Out of Food in the Last Year: Never true  Transportation Needs: Not on file  Physical Activity: Sufficiently Active (07/26/2023)   Exercise Vital Sign    Days of Exercise per Week: 7 days    Minutes of Exercise per Session: 30 min  Stress: Stress Concern Present (07/26/2023)   [employer] of Occupational Health - Occupational Stress Questionnaire    Feeling of Stress : To some extent  Social Connections: Socially Integrated (07/26/2023)   Social Connection and Isolation Panel    Frequency of Communication with Friends and Family: More than three times a week    Frequency of Social Gatherings with Friends and Family: More than three times a week    Attends Religious Services: More than 4 times per year    Active Member of Clubs or Organizations: Yes    Attends Banker Meetings: More than 4 times per year    Marital Status: Married  Depression (PHQ2-9): Low Risk (07/30/2024)   Depression (PHQ2-9)    PHQ-2 Score: 0  Alcohol Screen: Low Risk (07/26/2023)   Alcohol Screen    Last Alcohol Screening Score (AUDIT): 0  Housing: Low Risk (07/26/2023)   Housing    Last Housing Risk Score: 0  Utilities: Not on file  Health Literacy: Adequate Health Literacy (07/26/2023)   B1300 Health Literacy    Frequency of need for help with medical instructions: Never   Family History  Problem Relation Age of Onset   Melanoma Father        x 3 times, first in 83's   Hypertension Father    Hyperlipidemia Father    Squamous cell carcinoma Father    Colon polyps Father    Heart disease Father    Pancreatic cancer Father 50       CDKN2A muation   Arthritis Brother         Psoriatic   Breast cancer Brother 27       ATM + mutation? or VUS   Diabetes Maternal Grandmother    Hyperlipidemia Maternal Grandmother    Heart disease  Maternal Grandmother    Skin cancer Maternal Grandfather    Pancreatic cancer Paternal Grandmother 59       met liver   Heart disease Paternal Grandfather    Skin cancer Cousin    Breast cancer Other    Colon cancer Neg Hx    Stomach cancer Neg  Hx    Esophageal cancer Neg Hx    Allergies[1] Current Outpatient Medications  Medication Sig Dispense Refill   aspirin  EC 325 MG tablet Take 1 tablet (325 mg total) by mouth daily. 14 tablet 0   benzonatate  (TESSALON ) 200 MG capsule Take 1 capsule (200 mg total) by mouth 2 (two) times daily as needed for cough. 20 capsule 0   CONTOUR NEXT TEST test strip USE TO CHECK BLOOD SUGARS 1-2 TIMES A DAY 100 strip 3   Cyanocobalamin  (VITAMIN B-12) 1000 MCG SUBL Place 1 tablet (1,000 mcg total) under the tongue daily. 90 tablet 3   ibuprofen  (ADVIL ,MOTRIN ) 200 MG tablet Take 600 mg by mouth every 8 (eight) hours as needed (pain).     levocetirizine (XYZAL) 5 MG tablet Take 5 mg by mouth every evening.      lisinopril  (ZESTRIL ) 10 MG tablet TAKE 1 TABLET(10 MG) BY MOUTH DAILY FOR HIGH BLOOD PRESSURE 90 tablet 0   LORazepam  (ATIVAN ) 0.5 MG tablet 1-2 tabs 30 - 60 min prior to MRI. Do not drive with this medicine. (Patient not taking: Reported on 12/20/2024) 4 tablet 0   metFORMIN  (GLUCOPHAGE -XR) 500 MG 24 hr tablet TAKE 4 TABLETS BY MOUTH DAILY 360 tablet 3   Microlet Lancets MISC USE TO CHECK BLOOD SUGARS ONE TO TWO TIMES DAILY 100 each 3   montelukast (SINGULAIR) 10 MG tablet Take 10 mg by mouth every evening.      Multiple Vitamin (MULTIVITAMIN WITH MINERALS) TABS tablet Take 1 tablet by mouth daily.     omeprazole  (PRILOSEC) 40 MG capsule Take 1 capsule (40 mg total) by mouth daily. 30 capsule 12   oxyCODONE  (ROXICODONE ) 5 MG immediate release tablet Take 1 tablet (5 mg total) by mouth every 4 (four) hours as needed for severe pain (pain score 7-10) or breakthrough pain. (Patient not taking: Reported on 12/20/2024) 10 tablet 0   QNASL 80 MCG/ACT AERS Place 2  sprays into the nose every evening.      simvastatin  (ZOCOR ) 40 MG tablet TAKE 1 TABLET(40 MG) BY MOUTH DAILY AT 6 PM 90 tablet 1   tirzepatide  (MOUNJARO ) 12.5 MG/0.5ML Pen Inject 12.5 mg into the skin once a week. 6 mL 3   No current facility-administered medications for this visit.   No results found.  Review of Systems:   A ROS was performed including pertinent positives and negatives as documented in the HPI.   Musculoskeletal Exam:    There were no vitals taken for this visit.  Right hip incisions are well-appearing without erythema or drainage.  She has full distal neurosensory intact exam.  She walks with a nonantalgic gait.  There is some soreness about the posterior buttocks  Imaging:      I personally reviewed and interpreted the radiographs.   Assessment:   2 weeks status post right endoscopic proximal hamstring repair doing well.  This time she will continue to advance according to the repair protocol.  I would like her to be compliant with trunk leg angle of 90 degrees for an additional 2 weeks and I will plan to see her back in 4 weeks for reassessment  Plan :    - Return to clinic 4 weeks for reassessment      I personally saw and evaluated the patient, and participated in the management and treatment plan.  Elspeth Parker, MD Attending Physician, Orthopedic Surgery  This document was dictated using Dragon voice recognition software. A reasonable attempt at proof reading has  been made to minimize errors.    [1]  Allergies Allergen Reactions   Contrast Media [Iodinated Contrast Media] Other (See Comments)    ORAL and IV Fever and itchy mouth ORAL and IV Fever and itchy mouth   Adhesive [Tape]    Barium-Containing Compounds    Other     Adhesive, Metals   Polymyxin B -Trimethoprim  Swelling   Stadol [Butorphanol]    "

## 2024-12-29 ENCOUNTER — Encounter (HOSPITAL_BASED_OUTPATIENT_CLINIC_OR_DEPARTMENT_OTHER): Payer: Self-pay | Admitting: Physical Therapy

## 2025-01-03 ENCOUNTER — Ambulatory Visit (HOSPITAL_BASED_OUTPATIENT_CLINIC_OR_DEPARTMENT_OTHER): Payer: Self-pay | Admitting: Physical Therapy

## 2025-01-03 ENCOUNTER — Encounter (HOSPITAL_BASED_OUTPATIENT_CLINIC_OR_DEPARTMENT_OTHER): Payer: Self-pay | Admitting: Physical Therapy

## 2025-01-03 DIAGNOSIS — M25651 Stiffness of right hip, not elsewhere classified: Secondary | ICD-10-CM

## 2025-01-03 DIAGNOSIS — M25551 Pain in right hip: Secondary | ICD-10-CM

## 2025-01-03 DIAGNOSIS — M25661 Stiffness of right knee, not elsewhere classified: Secondary | ICD-10-CM

## 2025-01-03 DIAGNOSIS — R2689 Other abnormalities of gait and mobility: Secondary | ICD-10-CM

## 2025-01-03 NOTE — Therapy (Signed)
 " OUTPATIENT PHYSICAL THERAPY LOWER EXTREMITY EVALUATION   Patient Name: Nicole Gonzalez MRN: [id number] DOB:06-21-1971, 54 y.o., female Today's Date: 01/06/2025  END OF SESSION:  PT End of Session - 01/06/25 0858     Visit Number 3    Number of Visits 24    Date for Recertification  03/14/25    PT Start Time 1515    PT Stop Time 1558    PT Time Calculation (min) 43 min    Activity Tolerance Patient tolerated treatment well    Behavior During Therapy WFL for tasks assessed/performed           Past Medical History:  Diagnosis Date   Anemia     better after hysterectomy   Cancer (HCC)    basil cell carcinoma/forehead   Diabetes mellitus without complication (HCC)    DIABETES MELLITUS, GESTATIONAL, HX OF 06/02/2009   Qualifier: Diagnosis of  By: Charlett MD, Apolinar POUR    Diverticulosis    Family history of breast cancer    Family history of genetic disease carrier    father has CDKN2A and ATM mutation   Family history of melanoma    Family history of pancreatic cancer    Fracture of lower leg     fall 2011   GERD (gastroesophageal reflux disease)    History of basal cell carcinoma excision    History of gestational diabetes    x2   HTN (hypertension)    Hyperlipidemia    [id number] 043 trx    Increased frequency of headaches 03/15/2012   Internal hemorrhoids    Obesity    Seizures (HCC)    as an infant d/t fever   Tubular adenoma of colon    UNSPECIFIED TACHYCARDIA 09/01/2010   Qualifier: Diagnosis of  By: Domenica MD, Harlene     Past Surgical History:  Procedure Laterality Date   ABDOMINAL HYSTERECTOMY  06/29/2010    question had abnormal cells on Path, fibroids   BASAL CELL CARCINOMA EXCISION     BREAST BIOPSY Right 2009   COLONOSCOPY WITH PROPOFOL  N/A 01/17/2019   Procedure: COLONOSCOPY WITH PROPOFOL ;  Surgeon: Teressa Toribio SQUIBB, MD;  Location: [facility];  Service: Endoscopy;  Laterality: N/A;   ESOPHAGOGASTRODUODENOSCOPY N/A 09/04/2024   Procedure: EGD  (ESOPHAGOGASTRODUODENOSCOPY);  Surgeon: Wilhelmenia Aloha Raddle., MD;  Location: [facility];  Service: Gastroenterology;  Laterality: N/A;   ESOPHAGOGASTRODUODENOSCOPY (EGD) WITH PROPOFOL  N/A 01/17/2019   Procedure: ESOPHAGOGASTRODUODENOSCOPY (EGD) WITH PROPOFOL ;  Surgeon: Teressa Toribio SQUIBB, MD;  Location: [facility];  Service: Endoscopy;  Laterality: N/A;   EUS N/A 01/17/2019   Procedure: UPPER ENDOSCOPIC ULTRASOUND (EUS) RADIAL;  Surgeon: Teressa Toribio SQUIBB, MD;  Location: [facility];  Service: Endoscopy;  Laterality: N/A;   EUS N/A 09/04/2024   Procedure: ULTRASOUND, UPPER GI TRACT, ENDOSCOPIC;  Surgeon: Wilhelmenia Aloha Raddle., MD;  Location: [facility];  Service: Gastroenterology;  Laterality: N/A;   POLYPECTOMY  01/17/2019   Procedure: POLYPECTOMY;  Surgeon: Teressa Toribio SQUIBB, MD;  Location: [facility];  Service: Endoscopy;;   REPAIR QUADRICEPS/HAMSTRING MUSCLES Right 12/16/2024   Procedure: RIGHT HIP ENDOSCOPIC HAMSTRING REPAIR;  Surgeon: Genelle Standing, MD;  Location: [facility];  Service: Orthopedics;  Laterality: Right;  RIGHT ENDOSCOPIC HAMSTRING REPAIR   TONSILLECTOMY     Patient Active Problem List   Diagnosis Date Noted   Hamstring tear 12/16/2024   At high risk for pancreatic cancer 09/04/2024   At risk for cancer 09/04/2024   Erosive gastropathy 09/04/2024   Genetic  susceptibility to other malignant neoplasm 09/04/2024   History of colonic polyps    Polyp of ascending colon    Genetic testing 05/23/2018   Monoallelic mutation of CDKN2A gene 05/21/2018   Family history of pancreatic cancer    Family history of breast cancer    Family history of melanoma    Family history of gene mutation 04/06/2018   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   Hyperlipidemia 03/27/2015   Family history of breast cancer in female 03/27/2015   Rhinitis, allergic 10/16/2014   Left maxillary sinusitis 10/16/2014   Prediabetes 09/24/2014   Plantar fasciitis 10/22/2013   Abdominal swelling,  mass, or lump felt by patient 03/26/2013   Leg length discrepancy 03/22/2013   Newly diagnosed diabetes (HCC) 03/22/2013   Abdominal pain, chronic, right lower quadrant 03/22/2013   History of basal cell carcinoma excision    Visit for preventive health examination 03/15/2012   Hx of abdominal pain 03/15/2012   Anemia    OBESITY 07/15/2010   Allergic rhinitis 07/15/2010   HYPERGLYCEMIA 07/15/2010   Essential hypertension 06/02/2009   DIABETES MELLITUS, GESTATIONAL, HX OF 06/02/2009   KNEE PAIN 06/03/2008   HYPERLIPIDEMIA 10/09/2007   Esophagitis 10/09/2007   Hematuria 10/09/2007   HIDRADENITIS 10/09/2007    PCP: Apolinar Eastern MD   REFERRING PROVIDER: Genelle Standing, MD  REFERRING DIAG: RIGHT HIP ENDOSCOPIC HAMSTRING REPAIR  THERAPY DIAG:  Stiffness of right hip, not elsewhere classified  Stiffness of right knee, not elsewhere classified  Pain in right hip  Other abnormalities of gait and mobility Genelle Standing, MD Rationale for Evaluation and Treatment: Rehabilitation  ONSET DATE: Surgery on 01/05  Days since surgery: 18  SUBJECTIVE:   SUBJECTIVE STATEMENT: The patient reports she has been sore for about 3 days. Her pain is more in her mid belly of her hamstring. She does not remember doing anything different.    Pt. Had a right hip hamstring repair on 1/5 . Pain and soreness yesterday and this morning. She hasn't had to take pain medication, mostly keeping to the advil  and tylenol . Tried therapy in the past. She also tried PRP, tried everything until getting surgery. Dry needling helped but didn't last, PRP didn't do much.     PERTINENT HISTORY: Had surgery on Monday.  PAIN:  Are you having pain? Yes: NPRS scale: 4-5/10  Pain location: back of knee/thigh Pain description: Tightness and bruise. Feel a little twitching and tightness in area. Hasn't been today.  Aggravating factors: 4, mainly towards bruising area Relieving factors: 3  PRECAUTIONS:  Posterior hip  RED FLAGS: None   WEIGHT BEARING RESTRICTIONS: Yes WB as tolerated  FALLS:  Has patient fallen in last 6 months? No  LIVING ENVIRONMENT: Lives with: lives with their family and lives with their spouse Lives in: House/apartment Stairs: Yes: External: 2 steps; none Has following equipment at home: Crutches has been using them to help get off of the toilet. Has handrail in one br. First couple of days using crutches in/out of bed.   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   PLOF: Independent with basic ADLs and Needs assistance with homemaking  PATIENT GOALS: To be able to drive without hurting.   NEXT MD VISIT: 1/16  OBJECTIVE:  Note: Objective measures were completed at Evaluation unless otherwise noted.  DIAGNOSTIC FINDINGS: Fluoroscopy was utilized by the requesting physician.  No radiographic  interpretation.   PATIENT SURVEYS:  LEFS score: 26/80  COGNITION: Overall cognitive status: Within functional limits for tasks assessed     SENSATION: WFL  PALPATION: Swelling present  LOWER EXTREMITY ROM:  Passive ROM Right eval Left eval  Hip flexion 34   Hip extension    Hip abduction    Hip adduction    Hip internal rotation    Hip external rotation    Knee flexion    Knee extension    Ankle dorsiflexion    Ankle plantarflexion    Ankle inversion    Ankle eversion     (Blank rows = not tested)  LOWER EXTREMITY MMT: none indicated due to recent surgery   MMT Right eval Left eval  Hip flexion    Hip extension    Hip abduction    Hip adduction    Hip internal rotation    Hip external rotation    Knee flexion    Knee extension    Ankle dorsiflexion    Ankle plantarflexion    Ankle inversion    Ankle eversion     (Blank rows = not tested) not tested 2nd to recent surgery   LOWER EXTREMITY SPECIAL TESTS: None  indicated   FUNCTIONAL TESTS: None indicated  GAIT: Distance walked: Lobby to tx room ~ 20 ft Assistive device utilized: None Level of assistance: Complete Independence Comments: Dec knee flexion. Only using crutches to help with getting up from toilet at home. Was using to help get in and out of bed.   Pt. Was standing in waiting room and during eval. Unable to sit down. Can lay down.   Mild dec SLS time.                                                                                                                               TREATMENT DATE: 1/9  1/23  Manual: trigger poit release to mid belly of the hamstring   There-ex:  PROM into flexion  Hamstring activation 25% 3x10  SAQ 3x10  PPT 3x10  Quad set 3x10     1/16  There ex PROM into flexion - pt is reaching 60-70 Quad press with ball under knee 3x12 Bent knee fallouts - limited range 3x12   Eval:  Redressed wound - healing well.  PROM into flexion Heel slides with strap x 5 Quad press with towel under knee x 10  PATIENT EDUCATION:  Education details: Heel slide education and exercises to do at home Person educated: Patient and Spouse Education method: Medical Illustrator Education comprehension: verbalized understanding and returned demonstration  HOME EXERCISE PROGRAM: Insert code  ASSESSMENT:  CLINICAL IMPRESSION: The patient had increased soreness in her muscle belly today. We performed manual therapy to that area. We also reviewed a light activation exercise to reduce soreness. She had no increase in pain.  She was advised she can perform activities and exercise at home.  Will continue to  progress as tolerated. Her flexion was to 90 degrees in her hip without pain or restriction. She has been sitting more.     Patient is a 54 y.o. woman who was seen today for physical therapy evaluation and treatment for post-op hamstring repair.   Pt. Is WNL for post-op surgical protocol. Pt. Is  tolerating pain well and is WB well without assistive device. Pt. Will benefit from skilled PT to improve general funtional movility. Patient is unable to sit at this time.    OBJECTIVE IMPAIRMENTS: Abnormal gait, decreased activity tolerance, decreased coordination, decreased endurance, decreased mobility, difficulty walking, decreased ROM, and decreased strength.   ACTIVITY LIMITATIONS: carrying, bending, sitting, squatting, stairs, bed mobility, toileting, dressing, hygiene/grooming, and locomotion level  PARTICIPATION LIMITATIONS: cleaning, laundry, driving, and occupation  PERSONAL FACTORS: Age and Time since onset of injury/illness/exacerbation are also affecting patient's functional outcome.   REHAB POTENTIAL: Good  CLINICAL DECISION MAKING: Stable/uncomplicated  EVALUATION COMPLEXITY: Low   GOALS: Goals reviewed with patient? Yes  SHORT TERM GOALS: Target date: 01/06  Hip flex PROM inc to full ROM  Baseline: Goal status: INITIAL  2.  Knee flex PROM inc to full ROM Baseline:  Goal status: INITIAL  3.  Pt. Will be independent with base HEP Baseline:  Goal status: INITIAL  4.  Pt. Will be able to sit for 5 minutes with decreased pain  Baseline:  Goal status: INITIAL   LONG TERM GOALS: Target date: 03/16/2025    Pt. Will be able to bend knee without pain and numbness in order to resume driving responsibilities  Baseline:  Goal status: INITIAL  2.  Pt. Will be able to bend hip to 90 in order to resume work responsibilities Baseline:  Goal status: INITIAL  3.  Pt. Will be able to perform repeated sit to stand without pain in order to meet demands of office environment.  Baseline:  Goal status: INITIAL  PLAN:  PT FREQUENCY: 2x/week  PT DURATION: 12 weeks  PLANNED INTERVENTIONS: 97110-Therapeutic exercises, 97530- Therapeutic activity, 97112- Neuromuscular re-education, 97535- Self Care, [id number]- Manual therapy, [phone]- Gait training, [phone]- Aquatic Therapy,  Patient/Family education, Joint mobilization, and Scar mobilization, DN  PLAN FOR NEXT SESSION: PROM flexion, heel slide with strap, quad press with towel, self-STM  Nyoka Pearson, SPT   Alm JINNY Don, PT 01/06/2025, 9:00 AM   I have reviewed and concur with this student's documentation.   Alm JINNY Don, PT 01/06/2025 9:00 AM   During this treatment session, the therapist was present, participating in and directing the treatment.  "

## 2025-01-06 ENCOUNTER — Encounter (HOSPITAL_BASED_OUTPATIENT_CLINIC_OR_DEPARTMENT_OTHER): Payer: Self-pay | Admitting: Physical Therapy

## 2025-01-10 ENCOUNTER — Ambulatory Visit (HOSPITAL_BASED_OUTPATIENT_CLINIC_OR_DEPARTMENT_OTHER): Payer: Self-pay

## 2025-01-10 ENCOUNTER — Encounter (HOSPITAL_BASED_OUTPATIENT_CLINIC_OR_DEPARTMENT_OTHER): Payer: Self-pay

## 2025-01-10 DIAGNOSIS — R2689 Other abnormalities of gait and mobility: Secondary | ICD-10-CM

## 2025-01-10 DIAGNOSIS — M25661 Stiffness of right knee, not elsewhere classified: Secondary | ICD-10-CM

## 2025-01-10 DIAGNOSIS — M25551 Pain in right hip: Secondary | ICD-10-CM

## 2025-01-10 DIAGNOSIS — M25651 Stiffness of right hip, not elsewhere classified: Secondary | ICD-10-CM | POA: Diagnosis not present

## 2025-01-10 NOTE — Therapy (Signed)
 " OUTPATIENT PHYSICAL THERAPY LOWER EXTREMITY TREATMENT   Patient Name: Nicole Gonzalez MRN: [id number] DOB:1971-04-30, 54 y.o., female Today's Date: 01/10/2025  END OF SESSION:  PT End of Session - 01/10/25 1602     Visit Number 4    Number of Visits 24    Date for Recertification  03/14/25    Authorization Type BCBS    PT Start Time 1600    PT Stop Time 1636    PT Time Calculation (min) 36 min    Activity Tolerance Patient tolerated treatment well    Behavior During Therapy WFL for tasks assessed/performed            Past Medical History:  Diagnosis Date   Anemia     better after hysterectomy   Cancer (HCC)    basil cell carcinoma/forehead   Diabetes mellitus without complication (HCC)    DIABETES MELLITUS, GESTATIONAL, HX OF 06/02/2009   Qualifier: Diagnosis of  By: Charlett MD, Apolinar POUR    Diverticulosis    Family history of breast cancer    Family history of genetic disease carrier    father has CDKN2A and ATM mutation   Family history of melanoma    Family history of pancreatic cancer    Fracture of lower leg     fall 2011   GERD (gastroesophageal reflux disease)    History of basal cell carcinoma excision    History of gestational diabetes    x2   HTN (hypertension)    Hyperlipidemia    [id number] 043 trx    Increased frequency of headaches 03/15/2012   Internal hemorrhoids    Obesity    Seizures (HCC)    as an infant d/t fever   Tubular adenoma of colon    UNSPECIFIED TACHYCARDIA 09/01/2010   Qualifier: Diagnosis of  By: Domenica MD, Harlene     Past Surgical History:  Procedure Laterality Date   ABDOMINAL HYSTERECTOMY  06/29/2010    question had abnormal cells on Path, fibroids   BASAL CELL CARCINOMA EXCISION     BREAST BIOPSY Right 2009   COLONOSCOPY WITH PROPOFOL  N/A 01/17/2019   Procedure: COLONOSCOPY WITH PROPOFOL ;  Surgeon: Teressa Toribio SQUIBB, MD;  Location: [facility];  Service: Endoscopy;  Laterality: N/A;   ESOPHAGOGASTRODUODENOSCOPY N/A 09/04/2024    Procedure: EGD (ESOPHAGOGASTRODUODENOSCOPY);  Surgeon: Wilhelmenia Aloha Raddle., MD;  Location: [facility];  Service: Gastroenterology;  Laterality: N/A;   ESOPHAGOGASTRODUODENOSCOPY (EGD) WITH PROPOFOL  N/A 01/17/2019   Procedure: ESOPHAGOGASTRODUODENOSCOPY (EGD) WITH PROPOFOL ;  Surgeon: Teressa Toribio SQUIBB, MD;  Location: [facility];  Service: Endoscopy;  Laterality: N/A;   EUS N/A 01/17/2019   Procedure: UPPER ENDOSCOPIC ULTRASOUND (EUS) RADIAL;  Surgeon: Teressa Toribio SQUIBB, MD;  Location: [facility];  Service: Endoscopy;  Laterality: N/A;   EUS N/A 09/04/2024   Procedure: ULTRASOUND, UPPER GI TRACT, ENDOSCOPIC;  Surgeon: Wilhelmenia Aloha Raddle., MD;  Location: [facility];  Service: Gastroenterology;  Laterality: N/A;   POLYPECTOMY  01/17/2019   Procedure: POLYPECTOMY;  Surgeon: Teressa Toribio SQUIBB, MD;  Location: [facility];  Service: Endoscopy;;   REPAIR QUADRICEPS/HAMSTRING MUSCLES Right 12/16/2024   Procedure: RIGHT HIP ENDOSCOPIC HAMSTRING REPAIR;  Surgeon: Genelle Standing, MD;  Location: [facility];  Service: Orthopedics;  Laterality: Right;  RIGHT ENDOSCOPIC HAMSTRING REPAIR   TONSILLECTOMY     Patient Active Problem List   Diagnosis Date Noted   Hamstring tear 12/16/2024   At high risk for pancreatic cancer 09/04/2024   At risk for cancer 09/04/2024  Erosive gastropathy 09/04/2024   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 09/04/2024   History of colonic polyps    Polyp of ascending colon    Genetic testing 05/23/2018   Monoallelic mutation of CDKN2A gene 05/21/2018   Family history of pancreatic cancer    Family history of breast cancer    Family history of melanoma    Family history of gene mutation 04/06/2018   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   Hyperlipidemia 03/27/2015   Family history of breast cancer in female 03/27/2015   Rhinitis, allergic 10/16/2014   Left maxillary sinusitis 10/16/2014   Prediabetes 09/24/2014   Plantar fasciitis 10/22/2013    Abdominal swelling, mass, or lump felt by patient 03/26/2013   Leg length discrepancy 03/22/2013   Newly diagnosed diabetes (HCC) 03/22/2013   Abdominal pain, chronic, right lower quadrant 03/22/2013   History of basal cell carcinoma excision    Visit for preventive health examination 03/15/2012   Hx of abdominal pain 03/15/2012   Anemia    OBESITY 07/15/2010   Allergic rhinitis 07/15/2010   HYPERGLYCEMIA 07/15/2010   Essential hypertension 06/02/2009   DIABETES MELLITUS, GESTATIONAL, HX OF 06/02/2009   KNEE PAIN 06/03/2008   HYPERLIPIDEMIA 10/09/2007   Esophagitis 10/09/2007   Hematuria 10/09/2007   HIDRADENITIS 10/09/2007    PCP: Apolinar Eastern MD   REFERRING PROVIDER: Genelle Standing, MD  REFERRING DIAG: RIGHT HIP ENDOSCOPIC HAMSTRING REPAIR  THERAPY DIAG:  Stiffness of right hip, not elsewhere classified  Stiffness of right knee, not elsewhere classified  Pain in right hip  Other abnormalities of gait and mobility Genelle Standing, MD Rationale for Evaluation and Treatment: Rehabilitation  ONSET DATE: Surgery on 01/05  Days since surgery: 25  SUBJECTIVE:   SUBJECTIVE STATEMENT: Pt reports she was doing better until this morning, when she sat down on the toilet wrong. Has had increase in HS area discomfort since.    Pt. Had a right hip hamstring repair on 1/5 . Pain and soreness yesterday and this morning. She hasn't had to take pain medication, mostly keeping to the advil  and tylenol . Tried therapy in the past. She also tried PRP, tried everything until getting surgery. Dry needling helped but didn't last, PRP didn't do much.     PERTINENT HISTORY: Had surgery on Monday.  PAIN:  Are you having pain? Yes: NPRS scale: 4-5/10  Pain location: back of knee/thigh Pain description: Tightness and bruise. Feel a little twitching and tightness in area. Hasn't been today.  Aggravating factors: 4, mainly towards bruising area Relieving factors: 3  PRECAUTIONS:  Posterior hip  RED FLAGS: None   WEIGHT BEARING RESTRICTIONS: Yes WB as tolerated  FALLS:  Has patient fallen in last 6 months? No  LIVING ENVIRONMENT: Lives with: lives with their family and lives with their spouse Lives in: House/apartment Stairs: Yes: External: 2 steps; none Has following equipment at home: Crutches has been using them to help get off of the toilet. Has handrail in one br. First couple of days using crutches in/out of bed.   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   PLOF: Independent with basic ADLs and Needs assistance with homemaking  PATIENT GOALS: To be able to drive without hurting.   NEXT MD VISIT: 1/16  OBJECTIVE:  Note: Objective measures were completed at Evaluation unless otherwise noted.  DIAGNOSTIC FINDINGS: Fluoroscopy was utilized by the requesting physician.  No  radiographic  interpretation.   PATIENT SURVEYS:  LEFS score: 26/80  COGNITION: Overall cognitive status: Within functional limits for tasks assessed     SENSATION: WFL  PALPATION: Swelling present  LOWER EXTREMITY ROM:  Passive ROM Right eval Left eval  Hip flexion 34   Hip extension    Hip abduction    Hip adduction    Hip internal rotation    Hip external rotation    Knee flexion    Knee extension    Ankle dorsiflexion    Ankle plantarflexion    Ankle inversion    Ankle eversion     (Blank rows = not tested)  LOWER EXTREMITY MMT: none indicated due to recent surgery   MMT Right eval Left eval  Hip flexion    Hip extension    Hip abduction    Hip adduction    Hip internal rotation    Hip external rotation    Knee flexion    Knee extension    Ankle dorsiflexion    Ankle plantarflexion    Ankle inversion    Ankle eversion     (Blank rows = not tested) not tested 2nd to recent surgery   LOWER EXTREMITY SPECIAL TESTS: None  indicated   FUNCTIONAL TESTS: None indicated  GAIT: Distance walked: Lobby to tx room ~ 20 ft Assistive device utilized: None Level of assistance: Complete Independence Comments: Dec knee flexion. Only using crutches to help with getting up from toilet at home. Was using to help get in and out of bed.   Pt. Was standing in waiting room and during eval. Unable to sit down. Can lay down.   Mild dec SLS time.                                                                                                                               TREATMENT DATE: 1/9   1/30  STM to R HS (gentle) PROM into flexion  Hamstring activation 25% 2x10  SAQ 3x10 PPT 3x10  Quad set 3x10  Hooklying adductor squeeze 5 x33min Gait in hall x163ft      1/23  Manual: trigger poit release to mid belly of the hamstring   There-ex:  PROM into flexion  Hamstring activation 25% 3x10  SAQ 3x10  PPT 3x10  Quad set 3x10     1/16  There ex PROM into flexion - pt is reaching 60-70 Quad press with ball under knee 3x12 Bent knee fallouts - limited range 3x12   Eval:  Redressed wound - healing well.  PROM into flexion Heel slides with strap x 5 Quad press with towel under knee x 10  PATIENT EDUCATION:  Education details: Heel slide education and exercises to do at home Person educated: Patient and Spouse Education method: Medical Illustrator Education comprehension: verbalized understanding and returned demonstration  HOME EXERCISE PROGRAM: Insert code  ASSESSMENT:  CLINICAL IMPRESSION: Pt responded well to gentle STM to  mid HS in prone position. Continued with isometric focused interventions without c/o increased pain. Educated and cued pt in proper muscle activation and performance with exercises. Discussed activity restrictions/modifications to protect the repair at this point. Will continue to monitor pain level and progress per protocol.     Patient is a 54 y.o. woman who  was seen today for physical therapy evaluation and treatment for post-op hamstring repair.   Pt. Is WNL for post-op surgical protocol. Pt. Is tolerating pain well and is WB well without assistive device. Pt. Will benefit from skilled PT to improve general funtional movility. Patient is unable to sit at this time.    OBJECTIVE IMPAIRMENTS: Abnormal gait, decreased activity tolerance, decreased coordination, decreased endurance, decreased mobility, difficulty walking, decreased ROM, and decreased strength.   ACTIVITY LIMITATIONS: carrying, bending, sitting, squatting, stairs, bed mobility, toileting, dressing, hygiene/grooming, and locomotion level  PARTICIPATION LIMITATIONS: cleaning, laundry, driving, and occupation  PERSONAL FACTORS: Age and Time since onset of injury/illness/exacerbation are also affecting patient's functional outcome.   REHAB POTENTIAL: Good  CLINICAL DECISION MAKING: Stable/uncomplicated  EVALUATION COMPLEXITY: Low   GOALS: Goals reviewed with patient? Yes  SHORT TERM GOALS: Target date: 01/06  Hip flex PROM inc to full ROM  Baseline: Goal status: INITIAL  2.  Knee flex PROM inc to full ROM Baseline:  Goal status: INITIAL  3.  Pt. Will be independent with base HEP Baseline:  Goal status: INITIAL  4.  Pt. Will be able to sit for 5 minutes with decreased pain  Baseline:  Goal status: INITIAL   LONG TERM GOALS: Target date: 03/16/2025    Pt. Will be able to bend knee without pain and numbness in order to resume driving responsibilities  Baseline:  Goal status: INITIAL  2.  Pt. Will be able to bend hip to 90 in order to resume work responsibilities Baseline:  Goal status: INITIAL  3.  Pt. Will be able to perform repeated sit to stand without pain in order to meet demands of office environment.  Baseline:  Goal status: INITIAL  PLAN:  PT FREQUENCY: 2x/week  PT DURATION: 12 weeks  PLANNED INTERVENTIONS: 97110-Therapeutic exercises, 97530-  Therapeutic activity, 97112- Neuromuscular re-education, 97535- Self Care, 02859- Manual therapy, [phone]- Gait training, [phone]- Aquatic Therapy, Patient/Family education, Joint mobilization, and Scar mobilization, DN  PLAN FOR NEXT SESSION: PROM flexion, heel slide with strap, quad press with towel, self-STM  Asberry BRAVO Chardae Mulkern, PTA 01/10/2025, 5:18 PM    "

## 2025-01-14 ENCOUNTER — Ambulatory Visit (HOSPITAL_BASED_OUTPATIENT_CLINIC_OR_DEPARTMENT_OTHER): Payer: Self-pay

## 2025-01-14 ENCOUNTER — Encounter (HOSPITAL_BASED_OUTPATIENT_CLINIC_OR_DEPARTMENT_OTHER): Payer: Self-pay

## 2025-01-14 DIAGNOSIS — M25551 Pain in right hip: Secondary | ICD-10-CM

## 2025-01-14 DIAGNOSIS — M25651 Stiffness of right hip, not elsewhere classified: Secondary | ICD-10-CM

## 2025-01-14 DIAGNOSIS — M25661 Stiffness of right knee, not elsewhere classified: Secondary | ICD-10-CM

## 2025-01-14 DIAGNOSIS — R2689 Other abnormalities of gait and mobility: Secondary | ICD-10-CM

## 2025-01-15 ENCOUNTER — Other Ambulatory Visit: Payer: Self-pay | Admitting: Internal Medicine

## 2025-01-15 DIAGNOSIS — Z1231 Encounter for screening mammogram for malignant neoplasm of breast: Secondary | ICD-10-CM

## 2025-01-17 ENCOUNTER — Ambulatory Visit (HOSPITAL_BASED_OUTPATIENT_CLINIC_OR_DEPARTMENT_OTHER): Payer: Self-pay

## 2025-01-17 ENCOUNTER — Encounter (HOSPITAL_BASED_OUTPATIENT_CLINIC_OR_DEPARTMENT_OTHER): Payer: Self-pay

## 2025-01-17 DIAGNOSIS — M25661 Stiffness of right knee, not elsewhere classified: Secondary | ICD-10-CM

## 2025-01-17 DIAGNOSIS — M25651 Stiffness of right hip, not elsewhere classified: Secondary | ICD-10-CM

## 2025-01-17 DIAGNOSIS — M25551 Pain in right hip: Secondary | ICD-10-CM

## 2025-01-17 DIAGNOSIS — R2689 Other abnormalities of gait and mobility: Secondary | ICD-10-CM

## 2025-01-17 NOTE — Therapy (Signed)
 " OUTPATIENT PHYSICAL THERAPY LOWER EXTREMITY TREATMENT   Patient Name: Nicole Gonzalez MRN: [id number] DOB:[birth year], 54 y.o., female Today's Date: 01/17/2025  END OF SESSION:  PT End of Session - 01/17/25 1557     Visit Number 6    Number of Visits 24    Date for Recertification  03/14/25    Authorization Type BCBS    PT Start Time 1600    PT Stop Time 1640    PT Time Calculation (min) 40 min    Activity Tolerance Patient tolerated treatment well    Behavior During Therapy WFL for tasks assessed/performed              Past Medical History:  Diagnosis Date   Anemia     better after hysterectomy   Cancer (HCC)    basil cell carcinoma/forehead   Diabetes mellitus without complication (HCC)    DIABETES MELLITUS, GESTATIONAL, HX OF 06/02/2009   Qualifier: Diagnosis of  By: Charlett MD, Apolinar POUR    Diverticulosis    Family history of breast cancer    Family history of genetic disease carrier    father has CDKN2A and ATM mutation   Family history of melanoma    Family history of pancreatic cancer    Fracture of lower leg     fall 2011   GERD (gastroesophageal reflux disease)    History of basal cell carcinoma excision    History of gestational diabetes    x2   HTN (hypertension)    Hyperlipidemia    [id number] 043 trx    Increased frequency of headaches 03/15/2012   Internal hemorrhoids    Obesity    Seizures (HCC)    as an infant d/t fever   Tubular adenoma of colon    UNSPECIFIED TACHYCARDIA 09/01/2010   Qualifier: Diagnosis of  By: Domenica MD, Harlene     Past Surgical History:  Procedure Laterality Date   ABDOMINAL HYSTERECTOMY  06/29/2010    question had abnormal cells on Path, fibroids   BASAL CELL CARCINOMA EXCISION     BREAST BIOPSY Right 2009   COLONOSCOPY WITH PROPOFOL  N/A 01/17/2019   Procedure: COLONOSCOPY WITH PROPOFOL ;  Surgeon: Teressa Toribio SQUIBB, MD;  Location: [facility];  Service: Endoscopy;  Laterality: N/A;   ESOPHAGOGASTRODUODENOSCOPY N/A  09/04/2024   Procedure: EGD (ESOPHAGOGASTRODUODENOSCOPY);  Surgeon: Wilhelmenia Aloha Raddle., MD;  Location: [facility];  Service: Gastroenterology;  Laterality: N/A;   ESOPHAGOGASTRODUODENOSCOPY (EGD) WITH PROPOFOL  N/A 01/17/2019   Procedure: ESOPHAGOGASTRODUODENOSCOPY (EGD) WITH PROPOFOL ;  Surgeon: Teressa Toribio SQUIBB, MD;  Location: [facility];  Service: Endoscopy;  Laterality: N/A;   EUS N/A 01/17/2019   Procedure: UPPER ENDOSCOPIC ULTRASOUND (EUS) RADIAL;  Surgeon: Teressa Toribio SQUIBB, MD;  Location: [facility];  Service: Endoscopy;  Laterality: N/A;   EUS N/A 09/04/2024   Procedure: ULTRASOUND, UPPER GI TRACT, ENDOSCOPIC;  Surgeon: Wilhelmenia Aloha Raddle., MD;  Location: [facility];  Service: Gastroenterology;  Laterality: N/A;   POLYPECTOMY  01/17/2019   Procedure: POLYPECTOMY;  Surgeon: Teressa Toribio SQUIBB, MD;  Location: [facility];  Service: Endoscopy;;   REPAIR QUADRICEPS/HAMSTRING MUSCLES Right 12/16/2024   Procedure: RIGHT HIP ENDOSCOPIC HAMSTRING REPAIR;  Surgeon: Genelle Standing, MD;  Location: [facility];  Service: Orthopedics;  Laterality: Right;  RIGHT ENDOSCOPIC HAMSTRING REPAIR   TONSILLECTOMY     Patient Active Problem List   Diagnosis Date Noted   Hamstring tear 12/16/2024   At high risk for pancreatic cancer 09/04/2024   At risk for cancer  09/04/2024   Erosive gastropathy 09/04/2024   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 09/04/2024   History of colonic polyps    Polyp of ascending colon    Genetic testing 05/23/2018   Monoallelic mutation of CDKN2A gene 05/21/2018   Family history of pancreatic cancer    Family history of breast cancer    Family history of melanoma    Family history of gene mutation 04/06/2018   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   Hyperlipidemia 03/27/2015   Family history of breast cancer in female 03/27/2015   Rhinitis, allergic 10/16/2014   Left maxillary sinusitis 10/16/2014   Prediabetes 09/24/2014   Plantar fasciitis 10/22/2013    Abdominal swelling, mass, or lump felt by patient 03/26/2013   Leg length discrepancy 03/22/2013   Newly diagnosed diabetes (HCC) 03/22/2013   Abdominal pain, chronic, right lower quadrant 03/22/2013   History of basal cell carcinoma excision    Visit for preventive health examination 03/15/2012   Hx of abdominal pain 03/15/2012   Anemia    OBESITY 07/15/2010   Allergic rhinitis 07/15/2010   HYPERGLYCEMIA 07/15/2010   Essential hypertension 06/02/2009   DIABETES MELLITUS, GESTATIONAL, HX OF 06/02/2009   KNEE PAIN 06/03/2008   HYPERLIPIDEMIA 10/09/2007   Esophagitis 10/09/2007   Hematuria 10/09/2007   HIDRADENITIS 10/09/2007    PCP: Apolinar Eastern MD   REFERRING PROVIDER: Genelle Standing, MD  REFERRING DIAG: RIGHT HIP ENDOSCOPIC HAMSTRING REPAIR  THERAPY DIAG:  Stiffness of right hip, not elsewhere classified  Stiffness of right knee, not elsewhere classified  Pain in right hip  Other abnormalities of gait and mobility Genelle Standing, MD Rationale for Evaluation and Treatment: Rehabilitation  ONSET DATE: Surgery on 01/05  Days since surgery: 32  SUBJECTIVE:   SUBJECTIVE STATEMENT: Pt reports she drove to the grocery store nearby to see how she did. Mild discomfort with this, but no significant pain. 2-/10 pain level at entry. More tenderness. Had a random shooting pain down leg yesterday, unsure what triggered it.    Eval: Pt. Had a right hip hamstring repair on 1/5 . Pain and soreness yesterday and this morning. She hasn't had to take pain medication, mostly keeping to the advil  and tylenol . Tried therapy in the past. She also tried PRP, tried everything until getting surgery. Dry needling helped but didn't last, PRP didn't do much.     PERTINENT HISTORY: Had surgery on Monday.  PAIN:  Are you having pain? Yes: NPRS scale: 2-3/10  Pain location: back of knee/thigh Pain description: Tightness and bruise. Feel a little twitching and tightness in area. Hasn't  been today.  Aggravating factors: 4, mainly towards bruising area Relieving factors: 3  PRECAUTIONS: Posterior hip  RED FLAGS: None   WEIGHT BEARING RESTRICTIONS: Yes WB as tolerated  FALLS:  Has patient fallen in last 6 months? No  LIVING ENVIRONMENT: Lives with: lives with their family and lives with their spouse Lives in: House/apartment Stairs: Yes: External: 2 steps; none Has following equipment at home: Crutches has been using them to help get off of the toilet. Has handrail in one br. First couple of days using crutches in/out of bed.   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   PLOF: Independent with basic ADLs and Needs assistance with homemaking  PATIENT GOALS: To be able to drive without hurting.   NEXT MD VISIT: 1/16  OBJECTIVE:  Note: Objective  measures were completed at Evaluation unless otherwise noted.  DIAGNOSTIC FINDINGS: Fluoroscopy was utilized by the requesting physician.  No radiographic  interpretation.   PATIENT SURVEYS:  LEFS score: 26/80  COGNITION: Overall cognitive status: Within functional limits for tasks assessed     SENSATION: WFL  PALPATION: Swelling present  LOWER EXTREMITY ROM:  Passive ROM Right eval Left eval  Hip flexion 34   Hip extension    Hip abduction    Hip adduction    Hip internal rotation    Hip external rotation    Knee flexion    Knee extension    Ankle dorsiflexion    Ankle plantarflexion    Ankle inversion    Ankle eversion     (Blank rows = not tested)  LOWER EXTREMITY MMT: none indicated due to recent surgery   MMT Right eval Left eval  Hip flexion    Hip extension    Hip abduction    Hip adduction    Hip internal rotation    Hip external rotation    Knee flexion    Knee extension    Ankle dorsiflexion    Ankle plantarflexion    Ankle inversion    Ankle eversion      (Blank rows = not tested) not tested 2nd to recent surgery   LOWER EXTREMITY SPECIAL TESTS: None indicated   FUNCTIONAL TESTS: None indicated  GAIT: Distance walked: Lobby to tx room ~ 20 ft Assistive device utilized: None Level of assistance: Complete Independence Comments: Dec knee flexion. Only using crutches to help with getting up from toilet at home. Was using to help get in and out of bed.   Pt. Was standing in waiting room and during eval. Unable to sit down. Can lay down.   Mild dec SLS time.                                                                                                                               TREATMENT DATE:   2/6 STM to R HS (gentle) Prone hip extension 2x10  S/l hip abduction 3x10 BKFO 2x10ea Hamstring activation 25-50% 2x10  PPT with adductor sqz 5 2x10  SKTC 2x30sec bil LAQ 2x10bil 5 hold Gait in clinic 2x266ft Gait in hall: retro and lateral 2x63ft ea Seated Sit to stands from elevated plinth (monitored for any discomfort) 2x10   2/3 STM to R HS (gentle) Prone hip extension 2x10  S/l hip abduction 2x10 BKFO 2x10ea Hamstring activation 25-50% 2x10  PPT with adductor sqz 5 3x10  SKTC 2x30sec bil Partial LAQ alternating 2x30 Gait in clinic 2x263ft Gait in hall: retro and lateral 2x50ft ea      1/30  STM to R HS (gentle) PROM into flexion  Hamstring activation 25% 2x10  SAQ 3x10 PPT 3x10  Quad set 3x10  Hooklying adductor squeeze 5 x73min Gait in hall x179ft   PATIENT EDUCATION:  Education details: Heel slide education and exercises  to do at home Person educated: Patient and Spouse Education method: Medical Illustrator Education comprehension: verbalized understanding and returned demonstration  HOME EXERCISE PROGRAM: Insert code  ASSESSMENT:  CLINICAL IMPRESSION: Continued with STM to HS to decrease palpable restrictions here. She did report feeling very mild soreness in lateral HS following  BKFO. Monitored this throughout session. Instructed pt not to exceed pain limits with exercises. Trialed STS from elevated plinth which was ewll tolerated. She felt general muscular fatigue in LES but no HS discomfort. Will continue to monitor pain level and progress as tolerated.     Patient is a 54 y.o. woman who was seen today for physical therapy evaluation and treatment for post-op hamstring repair.   Pt. Is WNL for post-op surgical protocol. Pt. Is tolerating pain well and is WB well without assistive device. Pt. Will benefit from skilled PT to improve general funtional movility. Patient is unable to sit at this time.    OBJECTIVE IMPAIRMENTS: Abnormal gait, decreased activity tolerance, decreased coordination, decreased endurance, decreased mobility, difficulty walking, decreased ROM, and decreased strength.   ACTIVITY LIMITATIONS: carrying, bending, sitting, squatting, stairs, bed mobility, toileting, dressing, hygiene/grooming, and locomotion level  PARTICIPATION LIMITATIONS: cleaning, laundry, driving, and occupation  PERSONAL FACTORS: Age and Time since onset of injury/illness/exacerbation are also affecting patient's functional outcome.   REHAB POTENTIAL: Good  CLINICAL DECISION MAKING: Stable/uncomplicated  EVALUATION COMPLEXITY: Low   GOALS: Goals reviewed with patient? Yes  SHORT TERM GOALS: Target date: 01/06  Hip flex PROM inc to full ROM  Baseline: Goal status: INITIAL  2.  Knee flex PROM inc to full ROM Baseline:  Goal status: INITIAL  3.  Pt. Will be independent with base HEP Baseline:  Goal status: INITIAL  4.  Pt. Will be able to sit for 5 minutes with decreased pain  Baseline:  Goal status: INITIAL   LONG TERM GOALS: Target date: 03/16/2025    Pt. Will be able to bend knee without pain and numbness in order to resume driving responsibilities  Baseline:  Goal status: INITIAL  2.  Pt. Will be able to bend hip to 90 in order to resume work  responsibilities Baseline:  Goal status: INITIAL  3.  Pt. Will be able to perform repeated sit to stand without pain in order to meet demands of office environment.  Baseline:  Goal status: INITIAL  PLAN:  PT FREQUENCY: 2x/week  PT DURATION: 12 weeks  PLANNED INTERVENTIONS: 97110-Therapeutic exercises, 97530- Therapeutic activity, 97112- Neuromuscular re-education, 97535- Self Care, 02859- Manual therapy, [phone]- Gait training, [phone]- Aquatic Therapy, Patient/Family education, Joint mobilization, and Scar mobilization, DN  PLAN FOR NEXT SESSION: PROM flexion, heel slide with strap, quad press with towel, self-STM  [place]berry BRAVO Kaylan Friedmann, PTA 01/17/2025, 4:44 PM    "

## 2025-01-21 ENCOUNTER — Ambulatory Visit (HOSPITAL_BASED_OUTPATIENT_CLINIC_OR_DEPARTMENT_OTHER): Payer: Self-pay

## 2025-01-24 ENCOUNTER — Encounter (HOSPITAL_BASED_OUTPATIENT_CLINIC_OR_DEPARTMENT_OTHER): Payer: Self-pay

## 2025-01-27 ENCOUNTER — Encounter (HOSPITAL_BASED_OUTPATIENT_CLINIC_OR_DEPARTMENT_OTHER): Admitting: Physical Therapy

## 2025-01-29 ENCOUNTER — Encounter (HOSPITAL_BASED_OUTPATIENT_CLINIC_OR_DEPARTMENT_OTHER): Admitting: Orthopaedic Surgery

## 2025-01-30 ENCOUNTER — Ambulatory Visit

## 2025-01-31 ENCOUNTER — Encounter (HOSPITAL_BASED_OUTPATIENT_CLINIC_OR_DEPARTMENT_OTHER)

## 2025-02-03 ENCOUNTER — Encounter (HOSPITAL_BASED_OUTPATIENT_CLINIC_OR_DEPARTMENT_OTHER)

## 2025-02-06 ENCOUNTER — Encounter (HOSPITAL_BASED_OUTPATIENT_CLINIC_OR_DEPARTMENT_OTHER)

## 2025-04-02 ENCOUNTER — Ambulatory Visit: Admitting: Internal Medicine
